# Patient Record
Sex: Male | Born: 1960
Health system: Southern US, Community
[De-identification: ages and names within clinical notes are randomized; demographics above are authoritative.]

## PROBLEM LIST (undated history)

## (undated) DIAGNOSIS — I255 Ischemic cardiomyopathy: Secondary | ICD-10-CM

## (undated) DIAGNOSIS — H53139 Sudden visual loss, unspecified eye: Secondary | ICD-10-CM

## (undated) DIAGNOSIS — F1011 Alcohol abuse, in remission: Secondary | ICD-10-CM

## (undated) DIAGNOSIS — R2681 Unsteadiness on feet: Secondary | ICD-10-CM

## (undated) DIAGNOSIS — I2510 Atherosclerotic heart disease of native coronary artery without angina pectoris: Secondary | ICD-10-CM

## (undated) DIAGNOSIS — D649 Anemia, unspecified: Secondary | ICD-10-CM

## (undated) DIAGNOSIS — Z8711 Personal history of peptic ulcer disease: Secondary | ICD-10-CM

## (undated) DIAGNOSIS — I10 Essential (primary) hypertension: Secondary | ICD-10-CM

## (undated) DIAGNOSIS — N189 Chronic kidney disease, unspecified: Secondary | ICD-10-CM

## (undated) DIAGNOSIS — G4730 Sleep apnea, unspecified: Secondary | ICD-10-CM

## (undated) DIAGNOSIS — E785 Hyperlipidemia, unspecified: Secondary | ICD-10-CM

## (undated) DIAGNOSIS — N529 Male erectile dysfunction, unspecified: Secondary | ICD-10-CM

## (undated) DIAGNOSIS — Z8719 Personal history of other diseases of the digestive system: Secondary | ICD-10-CM

## (undated) DIAGNOSIS — Z8673 Personal history of transient ischemic attack (TIA), and cerebral infarction without residual deficits: Secondary | ICD-10-CM

## (undated) DIAGNOSIS — H3560 Retinal hemorrhage, unspecified eye: Secondary | ICD-10-CM

## (undated) DIAGNOSIS — C61 Malignant neoplasm of prostate: Secondary | ICD-10-CM

## (undated) HISTORY — DX: Anemia, unspecified: D64.9

## (undated) HISTORY — DX: Personal history of transient ischemic attack (TIA), and cerebral infarction without residual deficits: Z86.73

## (undated) HISTORY — DX: Retinal hemorrhage, unspecified eye: H35.60

## (undated) HISTORY — DX: Malignant neoplasm of prostate: C61

## (undated) HISTORY — DX: Ischemic cardiomyopathy: I25.5

## (undated) HISTORY — DX: Hyperlipidemia, unspecified: E78.5

## (undated) HISTORY — DX: Essential (primary) hypertension: I10

## (undated) HISTORY — DX: Atherosclerotic heart disease of native coronary artery without angina pectoris: I25.10

## (undated) HISTORY — DX: Sudden visual loss, unspecified eye: H53.139

## (undated) HISTORY — DX: Sleep apnea, unspecified: G47.30

## (undated) HISTORY — DX: Unsteadiness on feet: R26.81

## (undated) HISTORY — DX: Alcohol abuse, in remission: F10.11

---

## 2010-05-21 HISTORY — PX: REFRACTIVE SURGERY: SHX103

## 2010-05-21 HISTORY — PX: CORONARY ANGIOPLASTY WITH STENT PLACEMENT: SHX49

## 2010-11-28 ENCOUNTER — Inpatient Hospital Stay (HOSPITAL_COMMUNITY): Payer: Self-pay

## 2010-11-28 ENCOUNTER — Encounter: Payer: Self-pay | Admitting: Internal Medicine

## 2010-11-28 ENCOUNTER — Inpatient Hospital Stay (HOSPITAL_COMMUNITY)
Admission: EM | Admit: 2010-11-28 | Discharge: 2010-12-01 | DRG: 125 | Disposition: A | Discharge status: Home / Self Care | Payer: Self-pay | Source: Ambulatory Visit | Attending: Internal Medicine | Admitting: Internal Medicine

## 2010-11-28 ENCOUNTER — Other Ambulatory Visit (HOSPITAL_COMMUNITY): Payer: Self-pay

## 2010-11-28 ENCOUNTER — Emergency Department (HOSPITAL_COMMUNITY): Payer: Self-pay

## 2010-11-28 DIAGNOSIS — E785 Hyperlipidemia, unspecified: Secondary | ICD-10-CM | POA: Diagnosis present

## 2010-11-28 DIAGNOSIS — D649 Anemia, unspecified: Secondary | ICD-10-CM | POA: Diagnosis present

## 2010-11-28 DIAGNOSIS — I639 Cerebral infarction, unspecified: Secondary | ICD-10-CM | POA: Insufficient documentation

## 2010-11-28 DIAGNOSIS — IMO0002 Reserved for concepts with insufficient information to code with codable children: Secondary | ICD-10-CM | POA: Diagnosis present

## 2010-11-28 DIAGNOSIS — E1165 Type 2 diabetes mellitus with hyperglycemia: Secondary | ICD-10-CM | POA: Diagnosis present

## 2010-11-28 DIAGNOSIS — R0609 Other forms of dyspnea: Secondary | ICD-10-CM | POA: Diagnosis present

## 2010-11-28 DIAGNOSIS — R0989 Other specified symptoms and signs involving the circulatory and respiratory systems: Secondary | ICD-10-CM | POA: Diagnosis present

## 2010-11-28 DIAGNOSIS — H35039 Hypertensive retinopathy, unspecified eye: Principal | ICD-10-CM | POA: Diagnosis present

## 2010-11-28 DIAGNOSIS — I10 Essential (primary) hypertension: Secondary | ICD-10-CM | POA: Diagnosis present

## 2010-11-28 DIAGNOSIS — H538 Other visual disturbances: Secondary | ICD-10-CM | POA: Diagnosis present

## 2010-11-28 LAB — CBC
HCT: 32 % — ABNORMAL LOW (ref 39.0–52.0)
Hemoglobin: 11.1 g/dL — ABNORMAL LOW (ref 13.0–17.0)
MCH: 29.1 pg (ref 26.0–34.0)
MCHC: 34.7 g/dL (ref 30.0–36.0)
MCV: 84 fL (ref 78.0–100.0)
RDW: 13.7 % (ref 11.5–15.5)

## 2010-11-28 LAB — CK TOTAL AND CKMB (NOT AT ARMC): Relative Index: 2.7 — ABNORMAL HIGH (ref 0.0–2.5)

## 2010-11-28 LAB — DIFFERENTIAL
Eosinophils Relative: 4 % (ref 0–5)
Lymphocytes Relative: 21 % (ref 12–46)
Lymphs Abs: 2.1 10*3/uL (ref 0.7–4.0)
Monocytes Absolute: 0.9 10*3/uL (ref 0.1–1.0)
Monocytes Relative: 9 % (ref 3–12)
Neutro Abs: 6.8 10*3/uL (ref 1.7–7.7)

## 2010-11-28 LAB — IRON AND TIBC
Iron: 42 ug/dL (ref 42–135)
TIBC: 327 ug/dL (ref 215–435)

## 2010-11-28 LAB — CARDIAC PANEL(CRET KIN+CKTOT+MB+TROPI)
CK, MB: 3 ng/mL (ref 0.3–4.0)
CK, MB: 3.1 ng/mL (ref 0.3–4.0)
Relative Index: 2.3 (ref 0.0–2.5)
Total CK: 133 U/L (ref 7–232)
Troponin I: <0.3 ng/mL (ref <0.30)

## 2010-11-28 LAB — BASIC METABOLIC PANEL
BUN: 9 mg/dL (ref 6–23)
Creatinine, Ser: 0.95 mg/dL (ref 0.50–1.35)
GFR calc Af Amer: >60 mL/min (ref >60)
GFR calc non Af Amer: >60 mL/min (ref >60)
Glucose, Bld: 231 mg/dL — ABNORMAL HIGH (ref 70–99)
Potassium: 3.5 mEq/L (ref 3.5–5.1)

## 2010-11-28 LAB — FERRITIN: Ferritin: 230 ng/mL (ref 22–322)

## 2010-11-28 LAB — GLUCOSE, CAPILLARY: Glucose-Capillary: 259 mg/dL — ABNORMAL HIGH (ref 70–99)

## 2010-11-28 NOTE — H&P (Signed)
Hospital Admission Note Date: 11/28/2010  Patient name: Kevin Soto Medical record number: [id number] Date of birth: [birth year] Age: 50 y.o. Gender: male PCP: No primary provider on file.  Medical Service: B1 Teaching Service  Attending physician: Dr. Eppie Gibson   Pager: Resident (R2/R3):Dr. Ina Homes    Pager: [phone] Resident (R1): Dr. Victorio Palm    Pager: [phone]   CC: blurry vision in left eye   HPI:  Kevin Soto is a 50 year old male without a significant PMH who presents to the ED for blurry vision in the left eye. He does not have a documented history of tobacco, diabetes or hypertension and has not seen a physician in over 10 years. He reports a family history significant CHF, hypertension, CAD, diabetes, and stroke.  He states his symptoms began late Sunday evening yesterday when he was at home when both of eyes began to water. He reports using Visine drops because he initially thought his eyes may be dry. However, this did provide him with any relief. Over the next few minutes, he states his vision became "blurry and foggy" and there "was a white cloud" in his left eye. He reports that he cannot see images but is able to see some color in his left eye. His visual acuity and color vision in his right eye is intact. He denies a history of vision problems, glasses or contact use, tenderness, soreness, headache, facial pain, trauma, or itching. He also denies any dizziness, weakness, numbness, or chest pain. He states this has never happened before. In addition to the loss of vision in his left eye, he also reports increased exertional dyspnea since Sunday. He denies orthopnea, palpitations, PND, wheezing, coughing, edema, or sputum. He has not been treated for this problem, but does note resting alleviates his respiratory symptoms. Prior to this, he reports being able to climb multiple flights of stairs without becoming short of breath.  He reports he does not have a primary care doctor. He  admits to alcohol use, but denies ever having withdrawal symptoms or dependence. He also denies taking any medications or illicit drug use.  He is seeking further evaluation by the ED for his acute vision loss in his left eye.   Allergies: NKDA  PMH: None. Patient does not have a PCP and has not sought medical attention in over 10 years.  Past Surgical History: None  Substance History: never used tobacco; drinks 1 6 pack of beer and 16 ounces of beer during the week  Social History: lives in [place]; worked Electrical engineer stages until 3 months ago; now does free lance work; finished high school and 2 years of college; single, no children     Family History:  Mother- deceased around 32 yo due to CHF Father- deceased at 69yo due to CAD 1 sister- 2 strokes in early 68s  1 sister- 5 MIs in early 70s  Review of Systems: as per HPI General: no fever, chills diaphoresis HEENT: no earache, vertigo, hearing loss, rhinorrhea, allergies. Cardiac: No chest pain, palpitations, orthopnea, PND, syncope.  Resp: Dyspnea on exertion. No cough, sputum, wheezing.  GI: No nausea, vomiting, dysphagia. GU: No dysuria, urinary frequency. MSK: no recent trauma.  Neuro/Eyes: Blurry vision in the left eye. Vision intact in the right eye. No weakness, numbness, tremor, headache, diplopia, hearing loss, dizziness, vertigo, loss of sensation, paralysis.  Physical Exam:  General: 50 year old male who is sitting up in bed and in no acute distress  Vitals:  BP: 235-162/97-133 T:  98.1 HR 107 Resp: 20 Skin: Bilateral scarring on LE.  Head: Normocephalic.  Eyes: PERRLA. EOM intact. 20/25 visual acuity in the right eye and color vision intact. Less than 20/200 visual acuity in the left eye. Patient able to see some colors in the left eye.   Heart: RRR. No murmurs, gallops or rubs. Normal S1 and S2. Lungs: Lungs clear to auscultation bilaterally. No wheezes, crackles, or rales.    Extremities: <+1 bilateral  pitting edema on LE. Pulses 2+ and symmetric. Neurologic: CN2-12.  Intact.UE and LE sensation intact and strength 5/5 bilaterally.  Labs:  WBC COUNT 10.3 K/uL [4.0-10.5] RBC COUNT 3.81 MIL/uL [4.22-5.81] L HEMOGLOBIN 11.1 g/dL [13.0-17.0] L HEMATOCRIT 32.0 % [39.0-52.0] L MCV 84.0 fL [78.0-100.0] MCH 29.1 pg [26.0-34.0] MCHC 34.7 g/dL [30.0-36.0] RDW 13.7 % [11.5-15.5] PLATELET COUNT 350 K/uL [150-400] NEUTROPHIL 66 % [43-77] ABS GRANULOCYTE 6.8 K/uL [1.7-7.7] LYMPHOCYTE 21 % [12-46] ABS LYMPH 2.1 K/uL [0.7-4.0] MONOCYTE 9 % [3-12] ABS MONOCYTE 0.9 K/uL [0.1-1.0] EOSINOPHIL 4 FULL RESULT TOO LARGE TO VIEW HERE  SODIUM 140 mEq/L [135-145] POTASSIUM 3.5 mEq/L [3.5-5.1] CHLORIDE 103 mEq/L [96-112] CARBON DIOXIDE 28 mEq/L [19-32] GLUCOSE 231 mg/dL [70-99] H BUN 9 mg/dL [6-23] CREATININE 0.95 mg/dL [0.50-1.35] CALCIUM 9.1 mg/dL [8.4-10.5] GFR, Est Non Af Am >60 mL/min [>60] GFR, Est Afr Am >60 mL/min [>60]  Result: TROPONIN I <0.30 ng/mL [<0.30] CK, TOTAL 120 U/L [7-232] CK-MB 3.2 ng/mL [0.3-4.0] RELATIVE INDEX 2.7 [0.0-2.5] H B NATRIURETIC PEPTID 2396.0 pg/mL [0-125]   XRAY *RADIOLOGY REPORT* Clinical Data: Vertigo. Visual disturbance. CHEST - 2 VIEW Comparison: None. Findings: Heart size is at upper limit of normal. Diffuse pulmonary vascular congestion is seen. Poor definition of pulmonary interstitial markings is noted, and mild interstitial edema cannot be excluded. Tiny right pleural effusion or pleural thickening noted posteriorly. No mass or adenopathy identified. IMPRESSION: Pulmonary vascular congestion, and possible mild interstitial edema or pneumonitis. Comparison with any previous outside chest radiographs would be helpful if available.  CT HEAD W/O CONTRAST IMPRESSION: Age indeterminate infarcts within the right thalamus and right cerebellum - may be subacute given history. No evidence of Hemorrhage.   Assessment and Plan:  1. Left eye vision  loss- Opthomology was consulted. Head CT demonstrated possible subacute infarcts of the right thalamus and right cerebellum. MRI was also completed and results are pending. Based on the patient's presentation of hypertension and DM, the broad differential includes retinal detachment, vitreous hemorrhage, ischemic optic neuropathy, and foreign body. Less likely diagnosis include ophthalmic artery occlusion since his color vision is intact. 2. Hypertensive emergency- Patient's BP is elevated but he is asymptomatic. First set of cardiac enzymes were negative. Continue to monitor urinary output, creatinine, and any additional neurologic abnormalities. Given BP of 230/130 with new onset of blurry vision, patient meets criteria for HTN. Patient was started on nicardipine drip 5mg /hr IV.   3. Dyspnea, Pitting Edema- Patient reports a 2 day history of dyspnea on exertion which is alleviated with rest. On exam, lungs were clear bilaterally. CXR reveled heart size was the upper limit of normal, pulmonary vascular congestion, and possible mild interstitial edema or pneumonitis. BNP was also elevated at 2396.0 pg/mL. On physical exam, there was mild bilateral pitting edema. Based on the symptoms, labs, and family history, the most likely diagnosis is CHF which may have been precipitated from hypertensive crisis. Other diagnosis to consider include pulmonary edema and myocardial ischemia. EKG revealed diffuse abnormal T waves. Further workup is required and the patient is scheduled  for an echo; fasting lipid panel is pending. For the management of this patient, we will continue to monitor and treat his BP as well as  start Lasix  4. Anemia- On ED admission, hemoglobin was 11.1g/dL, and Hematocrit was 32.0%. His MCV was within normal limits. We will order an anemia panel to explore possible etiology/  5. Hyperglycemia- Fasting BS was 231mg /dL on admission.  Patient most likely has DM. This may be contributing to his  retinopathy and will require long term management by an outpatient PCP following discharge. Check Hemoglobin A1C and started on sliding scale of insulin. Once the patient is stable, start on an ace inhibitor for hypertension and diabetes.    6. Once he is stable,  a. Tylenol 650mg  po every 6 hours prn for pain/fever  b. Zofran 4mg  IV every 4 hours prn nausea    R3: Dr. Ina Homes  [id number]      ____________________________________________________  R1: Dr. Victorio Palm [id number]   ____________________________________________________

## 2010-11-29 DIAGNOSIS — I059 Rheumatic mitral valve disease, unspecified: Secondary | ICD-10-CM

## 2010-11-29 LAB — HEMOGLOBIN A1C: Mean Plasma Glucose: 249 mg/dL — ABNORMAL HIGH (ref <117)

## 2010-11-29 LAB — GLUCOSE, CAPILLARY
Glucose-Capillary: 261 mg/dL — ABNORMAL HIGH (ref 70–99)
Glucose-Capillary: 176 mg/dL — ABNORMAL HIGH (ref 70–99)
Glucose-Capillary: 167 mg/dL — ABNORMAL HIGH (ref 70–99)

## 2010-11-29 LAB — DRUGS OF ABUSE SCREEN W/O ALC, ROUTINE URINE
Marijuana Metabolite: NEGATIVE
Opiate Screen, Urine: NEGATIVE
Propoxyphene: NEGATIVE

## 2010-11-29 LAB — LIPID PANEL
Cholesterol: 227 mg/dL — ABNORMAL HIGH (ref 0–200)
Triglycerides: 104 mg/dL (ref <150)

## 2010-11-30 DIAGNOSIS — I10 Essential (primary) hypertension: Secondary | ICD-10-CM

## 2010-11-30 DIAGNOSIS — R933 Abnormal findings on diagnostic imaging of other parts of digestive tract: Secondary | ICD-10-CM

## 2010-11-30 LAB — GLUCOSE, CAPILLARY
Glucose-Capillary: 183 mg/dL — ABNORMAL HIGH (ref 70–99)
Glucose-Capillary: 202 mg/dL — ABNORMAL HIGH (ref 70–99)

## 2010-12-01 ENCOUNTER — Inpatient Hospital Stay (HOSPITAL_COMMUNITY): Payer: Self-pay

## 2010-12-01 DIAGNOSIS — I5022 Chronic systolic (congestive) heart failure: Secondary | ICD-10-CM

## 2010-12-01 LAB — GLUCOSE, CAPILLARY

## 2010-12-01 MED ORDER — TECHNETIUM TC 99M TETROFOSMIN IV KIT
10.0000 | PACK | Freq: Once | INTRAVENOUS | Status: AC | PRN
  Administered 2010-12-01: 10 via INTRAVENOUS

## 2010-12-01 MED ORDER — TECHNETIUM TC 99M TETROFOSMIN IV KIT
30.0000 | PACK | Freq: Once | INTRAVENOUS | Status: AC | PRN
  Administered 2010-12-01: 30 via INTRAVENOUS

## 2010-12-02 ENCOUNTER — Other Ambulatory Visit (HOSPITAL_COMMUNITY): Payer: Self-pay

## 2010-12-04 ENCOUNTER — Telehealth: Payer: Self-pay | Admitting: Dietician

## 2010-12-04 NOTE — Telephone Encounter (Signed)
Left message for patient to return call.

## 2010-12-06 ENCOUNTER — Encounter: Payer: Self-pay | Admitting: Cardiology

## 2010-12-06 NOTE — Telephone Encounter (Signed)
Appointment scheduled for Thursday, 7.20.12

## 2010-12-07 ENCOUNTER — Encounter: Payer: Self-pay | Admitting: Cardiology

## 2010-12-07 ENCOUNTER — Encounter: Payer: Self-pay | Admitting: Dietician

## 2010-12-07 ENCOUNTER — Ambulatory Visit: Payer: Self-pay | Admitting: Dietician

## 2010-12-07 ENCOUNTER — Ambulatory Visit (INDEPENDENT_AMBULATORY_CARE_PROVIDER_SITE_OTHER): Payer: Self-pay | Admitting: Cardiology

## 2010-12-07 VITALS — BP 198/110 | HR 76 | Ht 72.0 in | Wt 221.2 lb

## 2010-12-07 DIAGNOSIS — I428 Other cardiomyopathies: Secondary | ICD-10-CM

## 2010-12-07 DIAGNOSIS — I10 Essential (primary) hypertension: Secondary | ICD-10-CM

## 2010-12-07 DIAGNOSIS — Z7289 Other problems related to lifestyle: Secondary | ICD-10-CM

## 2010-12-07 DIAGNOSIS — I420 Dilated cardiomyopathy: Secondary | ICD-10-CM

## 2010-12-07 MED ORDER — SPIRONOLACTONE 25 MG PO TABS: 25.0000 mg | ORAL_TABLET | Freq: Every day | ORAL | Status: DC

## 2010-12-07 MED ORDER — CLONIDINE HCL 0.1 MG PO TABS: 0.1000 mg | ORAL_TABLET | Freq: Two times a day (BID) | ORAL | Status: DC | PRN

## 2010-12-07 MED ORDER — CARVEDILOL 12.5 MG PO TABS: 12.5000 mg | ORAL_TABLET | Freq: Two times a day (BID) | ORAL | Status: DC

## 2010-12-07 MED ORDER — LISINOPRIL 20 MG PO TABS: 20.0000 mg | ORAL_TABLET | Freq: Every day | ORAL | Status: DC

## 2010-12-07 NOTE — Patient Instructions (Addendum)
Please increase your Carvedilol to 12.5 mg one twice a day, increase your Lisinopril to 20 mg a day and increase your Spironolactone to 25 mg a day.  Please take Clonidine 0.1 mg up to twice a day if your systolic blood pressure is 180 or higher.  Please follow up in 1 week as scheduled

## 2010-12-07 NOTE — Assessment & Plan Note (Signed)
I suspect that this is hypertensive although I will consider cath in the future given the multiple perfusion defects.  We need to get BP control first.

## 2010-12-07 NOTE — Assessment & Plan Note (Signed)
His blood pressure is still poorly controlled. Tonight they will go prescription filled for .1 clonidine and take this. He may repeat this if his systolic remains above 99991111. I'm going to increase the medications as listed below doubling his beta blocker, ACE inhibitor and Aldactone. He will come back or followup next week. He does have a blood pressure cuff at home and he will call is in the morning with his reading. I will also try to facilitate followup with his primary providers.  (Greater than 40 minutes reviewing all data with greater than 50% face to face with the patient).  He has been educated extensively at this visit about BP management.

## 2010-12-07 NOTE — Progress Notes (Signed)
HPI The patient presents for evaluation of hypertension and cardiomyopathy. He was recently hospitalized for this. He was ejection fraction 45%. There was diffuse hypokinesis as well as some septal apical and inferobasilar wall motion abnormalities. He presented with blurred vision and a retinal hemorrhage and was found to have hypertensive urgency or brain MRI demonstrate no acute events. He had some mild interstitial edema on chest x-ray. Stress perfusion study was nondiagnostic for ischemia. There was scar felt to be in the anterior anterolateral anteroseptal and inferoseptal as well as inferolateral and apical distribution.  He was medically managed.  In retrospect he was having some dyspnea this is resolved. He stated his medications. He still drinking 1/5 of alcohol weekly. He is trying to avoid salt. He does do some walking.  The patient denies any new symptoms such as chest discomfort, neck or arm discomfort. There has been no new shortness of breath, PND or orthopnea. There have been no reported palpitations, presyncope or syncope.   No Known Allergies  Current Outpatient Prescriptions  Medication Sig Dispense Refill  . carvedilol (COREG) 6.25 MG tablet Take 6.25 mg by mouth 2 (two) times daily.        . insulin aspart (NOVOLOG) 100 UNIT/ML injection        . lisinopril (PRINIVIL,ZESTRIL) 10 MG tablet Take 10 mg by mouth daily.        . metFORMIN (GLUCOPHAGE) 500 MG tablet Take 500 mg by mouth 2 (two) times daily with a meal.       . pravastatin (PRAVACHOL) 40 MG tablet Take 40 mg by mouth daily.        Marland Kitchen spironolactone (ALDACTONE) 25 MG tablet Take 12.5 mg by mouth daily.          Past Medical History  Diagnosis Date  . Diabetes mellitus   . Hypertension   . Macular hemorrhage   . Dyslipidemia   . Dilated cardiomyopathy   . Anemia     Past Surgical History  Procedure Date  . None     Family History  Problem Relation Age of Onset  . Heart attack Mother     Died 61  .  Heart failure Mother   . Diabetes Mother   . Heart attack Father   . Heart failure Father   . Diabetes Father   . Heart disease Sister     s/p CABG x3 in her mid 10's  . Coronary artery disease Other     Premature    History   Social History  . Marital Status: Single    Spouse Name: N/A    Number of Children: 0  . Years of Education: N/A   Occupational History  . Stage hand    Social History Main Topics  . Smoking status: Never Smoker   . Smokeless tobacco: Not on file  . Alcohol Use: 15.6 oz/week    6 Cans of beer, 20 Shots of liquor per week     6-pack of beer a week/ half a fifth of liquor each week  . Drug Use: No  . Sexually Active: Not on file   Other Topics Concern  . Not on file   Social History Narrative   [place] in [place] with his cousinNo childrenExercises 3 times a week    ROS:  As stated in the HPI and negative for all other systems.  PHYSICAL EXAM BP 198/110  Pulse 76  Ht 6' (1.829 m)  Wt 221 lb 3.2 oz (100.336 kg)  BMI 30.00 kg/m2 GENERAL:  Well appearing HEENT:  Pupils equal round and reactive, fundi not visualized, oral mucosa unremarkable NECK:  No jugular venous distention, waveform within normal limits, carotid upstroke brisk and symmetric, no bruits, no thyromegaly LYMPHATICS:  No cervical, inguinal adenopathy LUNGS:  Clear to auscultation bilaterally BACK:  No CVA tenderness CHEST:  Unremarkable HEART:  PMI not displaced or sustained,S1 and S2 within normal limits, no S3, no clicks, no rubs, no , positive S4 ABD:  Flat, positive bowel sounds normal in frequency in pitch, no bruits, no rebound, no guarding, no midline pulsatile mass, no hepatomegaly, no splenomegaly EXT:  2 plus pulses throughout, no edema, no cyanosis no clubbing SKIN:  No rashes no nodules NEURO:  Cranial nerves II through XII grossly intact, motor grossly intact throughout PSYCH:  Cognitively intact, oriented to person place and time  EKG:  11/28/10  Sinus rhythm,  anterolateral T wave inversion.  ASSESSMENT AND PLAN

## 2010-12-07 NOTE — Assessment & Plan Note (Signed)
I suggested no EtOH use but at the least he needs to greatly cut back.

## 2010-12-08 ENCOUNTER — Telehealth: Payer: Self-pay | Admitting: Cardiology

## 2010-12-08 NOTE — Telephone Encounter (Signed)
Saw doctor yesterday and was told to call BP reading in today.  This am 184/107  And after eating 143/74. Pt is feeling fine.  Please call wife back at [phone]

## 2010-12-08 NOTE — Telephone Encounter (Signed)
BP was 184/107 this morning before medication. After medication, his BP was 143/74. Patient is feeling ok. They will notify us if his BP increases.

## 2010-12-13 ENCOUNTER — Ambulatory Visit (INDEPENDENT_AMBULATORY_CARE_PROVIDER_SITE_OTHER): Payer: Self-pay | Admitting: Nurse Practitioner

## 2010-12-13 ENCOUNTER — Encounter: Payer: Self-pay | Admitting: Nurse Practitioner

## 2010-12-13 VITALS — BP 150/82 | HR 74 | Ht 72.0 in | Wt 228.0 lb

## 2010-12-13 DIAGNOSIS — I420 Dilated cardiomyopathy: Secondary | ICD-10-CM

## 2010-12-13 DIAGNOSIS — I10 Essential (primary) hypertension: Secondary | ICD-10-CM

## 2010-12-13 DIAGNOSIS — I428 Other cardiomyopathies: Secondary | ICD-10-CM

## 2010-12-13 LAB — BASIC METABOLIC PANEL
BUN: 17 mg/dL (ref 6–23)
CO2: 29 mEq/L (ref 19–32)
Calcium: 9.4 mg/dL (ref 8.4–10.5)
Chloride: 105 mEq/L (ref 96–112)
Creatinine, Ser: 1.2 mg/dL (ref 0.4–1.5)
GFR: 82.41 mL/min (ref >60.00)
Glucose, Bld: 210 mg/dL — ABNORMAL HIGH (ref 70–99)
Potassium: 4.3 mEq/L (ref 3.5–5.1)
Sodium: 140 mEq/L (ref 135–145)

## 2010-12-13 MED ORDER — LISINOPRIL 20 MG PO TABS: 20.0000 mg | ORAL_TABLET | Freq: Two times a day (BID) | ORAL | Status: DC

## 2010-12-13 MED ORDER — CARVEDILOL 12.5 MG PO TABS: 25.0000 mg | ORAL_TABLET | Freq: Two times a day (BID) | ORAL | Status: DC

## 2010-12-13 NOTE — Progress Notes (Signed)
Kevin Soto Date of Birth: 07/03/1960   History of Present Illness: Kevin Soto is seen back today for a one week check. He is seen for [name]. He is here with his wife. He is feeling ok. He is not tolerating the Clonidine. He is sleeping too much, feels staggery and his mouth is too dry. His wife thought the clonidine made his blood pressure go up.  Blood pressure readings from home range in the 150 to 190's. He is drinking less alcohol. No chest pain. No shortness of breath. He is tolerating the other medicines. He has never had his cuff checked for accuracy. He will more than likely require cardiac cath once his blood pressure is controlled due to scarring on nuclear study. There was no ischemia. EF was 45%.   Current Outpatient Prescriptions on File Prior to Visit  Medication Sig Dispense Refill  . cloNIDine (CATAPRES) 0.1 MG tablet Take 1 tablet (0.1 mg total) by mouth 2 (two) times daily as needed (may take twice a day if systolic blood pressure 99991111 or higher).  60 tablet  6  . metFORMIN (GLUCOPHAGE) 500 MG tablet Take 500 mg by mouth 2 (two) times daily with a meal.       . pravastatin (PRAVACHOL) 40 MG tablet Take 40 mg by mouth daily.        Marland Kitchen spironolactone (ALDACTONE) 25 MG tablet Take 1 tablet (25 mg total) by mouth daily.  30 tablet  6  . DISCONTD: carvedilol (COREG) 12.5 MG tablet Take 1 tablet (12.5 mg total) by mouth 2 (two) times daily.  60 tablet  6  . DISCONTD: lisinopril (PRINIVIL,ZESTRIL) 20 MG tablet Take 1 tablet (20 mg total) by mouth daily.  30 tablet  6    No Known Allergies  Past Medical History  Diagnosis Date  . Diabetes mellitus   . Hypertension   . Macular hemorrhage   . Dyslipidemia   . Dilated cardiomyopathy     EF is 45%  . Anemia   . Abnormal nuclear cardiac imaging test     No ischemia, but with scar. EF 45%    No past surgical history on file.  History  Smoking status  . Never Smoker   Smokeless tobacco  . Never Used     History  Alcohol Use  . 15.6 oz/week  . 6 Cans of beer, 20 Shots of liquor per week    6-pack of beer a week/ half a fifth of liquor each week    Family History  Problem Relation Age of Onset  . Heart attack Mother     Died 61  . Heart failure Mother   . Diabetes Mother   . Heart attack Father   . Heart failure Father   . Diabetes Father   . Heart disease Sister     s/p CABG x3 in her mid 80's  . Coronary artery disease Other     Premature    Review of Systems: The review of systems is as above. No edema. No shortness of breath. No chest pain.  All other systems were reviewed and are negative.  Physical Exam: BP 150/82  Pulse 74  Ht 6' (1.829 m)  Wt 228 lb (103.42 kg)  BMI 30.92 kg/m2 Patient is a pleasant black male who is in no acute distress. Skin is warm and dry. Color is normal.  HEENT is unremarkable. Normocephalic/atraumatic. PERRL. Sclera are nonicteric. Neck is supple. No masses. No JVD. Lungs are clear.  Cardiac exam shows a regular rate and rhythm. Positive S4 noted. Abdomen is soft. Extremities are without edema. Gait and ROM are intact. No gross neurologic deficits noted.  LABORATORY DATA: BMET is pending   Assessment / Plan:

## 2010-12-13 NOTE — Assessment & Plan Note (Signed)
Will ultimately need cath once blood pressure is more controlled. Patient is aware.

## 2010-12-13 NOTE — Assessment & Plan Note (Addendum)
Blood pressure still not controlled. He does not tolerate Clonidine. I have increased the Coreg to 25 mg BID and increased the lisinopril to 20 mg BID. We will check a BMET to look at potassium level. I will see him in one week and he will bring his cuff in to check for correlation. He will continue to monitor at home. Patient and wife are agreeable to this plan and will call if any problems develop in the interim.   Addendum:  Spoke with Dr. Marinda Elk of [facility]. Blood pressure still elevated this past Friday. HCTZ was started. Appointment with opthamologist was made as well.

## 2010-12-13 NOTE — Patient Instructions (Signed)
Increase your Coreg to 25 mg two times a day (take 2 of your 12.5 mg tablets two times a day) Increase your Lisinopril to 20 mg two times a day I will see you in one week You may stay off the Clonidine Call for blood pressure above 180 consistently Continue to watch your salt Bring your blood pressure cuff in for me to check.

## 2010-12-14 ENCOUNTER — Telehealth: Payer: Self-pay | Admitting: Cardiology

## 2010-12-14 NOTE — Telephone Encounter (Signed)
Returning call back to christine

## 2010-12-14 NOTE — Telephone Encounter (Signed)
PT AWARE OF LAB RESULTS SEE LAB REPORT .Kevin Soto

## 2010-12-15 ENCOUNTER — Encounter: Payer: Self-pay | Admitting: Internal Medicine

## 2010-12-15 ENCOUNTER — Ambulatory Visit (INDEPENDENT_AMBULATORY_CARE_PROVIDER_SITE_OTHER): Payer: Self-pay | Admitting: Internal Medicine

## 2010-12-15 VITALS — BP 181/103 | HR 80 | Temp 97.0°F | Ht 72.0 in | Wt 226.0 lb

## 2010-12-15 DIAGNOSIS — E119 Type 2 diabetes mellitus without complications: Secondary | ICD-10-CM

## 2010-12-15 DIAGNOSIS — I420 Dilated cardiomyopathy: Secondary | ICD-10-CM

## 2010-12-15 DIAGNOSIS — I428 Other cardiomyopathies: Secondary | ICD-10-CM

## 2010-12-15 DIAGNOSIS — H3560 Retinal hemorrhage, unspecified eye: Secondary | ICD-10-CM

## 2010-12-15 DIAGNOSIS — I10 Essential (primary) hypertension: Secondary | ICD-10-CM

## 2010-12-15 DIAGNOSIS — E1149 Type 2 diabetes mellitus with other diabetic neurological complication: Secondary | ICD-10-CM | POA: Insufficient documentation

## 2010-12-15 MED ORDER — HYDROCHLOROTHIAZIDE 12.5 MG PO CAPS: 25.0000 mg | ORAL_CAPSULE | Freq: Every day | ORAL | Status: DC

## 2010-12-15 NOTE — Assessment & Plan Note (Addendum)
Patient has been on three medications since being discharged from the hospital. However, his Bp was not well controlled. His Bp is 181/103 today. 25 mg once daily HCTZ is added to his regimen. Patient is instructed on next Monday when I will check his Bp and BMP. I will contact [name] to have his opinion about patient's future plan.

## 2010-12-15 NOTE — Assessment & Plan Note (Signed)
Patient is free of symptoms now. There are no signs of fluid retention. Will continue current regimen and follow up. BMP will be done on next Monday.

## 2010-12-15 NOTE — Assessment & Plan Note (Signed)
Patient is on metformin. Will observe closely to see his response. No change for current regimen.

## 2010-12-15 NOTE — Progress Notes (Signed)
  Subjective:    Patient ID: Kevin Soto, male    DOB: 08-28-1960, 50 y.o.   MRN: [id number]  HPI Patient is 50 yo male presents for a follow-up visit. Patient did not have significant PMH until his last admission on July 10th, 2012 because of hypertensive emergency. He was hospitalized because of burry vision and dyspnea on exertion. She was found to have high blood pressure (235-162/97133 mmHg), high blood glucose, and hypertensive retinopathy with retinal macular hemorrhage of the left eye and cardiomyopathy with diffuse hypokinesis as well as some septal apical and inferobasilar wall motion abnormalities. He was treated with Spironolactone, Lisinopril, Carvedilol, Simvastatin and metformin.  After being discharged, he was followed up by [name]. Today, patient reports of blurry vision in his left eye. He denies headache, chest pain, SOB, abdominal pain, weakness, numbness, swelling in ankles, fever and rashes.   Review of Systems  General: no fevers, chills, changes in weight, changes in appetite Skin: no rash HEENT:  blurry vision in left eye, no  hearing changes, sore throat Pulm: no dyspnea, coughing, wheezing CV: no chest pain, palpitations, shortness of breath Abd: no abdominal pain, nausea/vomiting, diarrhea/constipation GU: no dysuria, hematuria, polyuria Ext: no arthralgias, myalgias and edema Neuro: no weakness, numbness, or tingling      Objective:   Physical Exam  General: alert, well-developed, and cooperative to examination.  Head: normocephalic and atraumatic.  Eyes: Decreased acuity in left eye, EOMI, pupils equal, pupils round, pupils reactive to light, no injection and anicteric.  Mouth: pharynx pink and moist, no erythema, and no exudates.  Neck: supple, full ROM, no thyromegaly, no JVD, and no carotid bruits.  Lungs: normal respiratory effort, no accessory muscle use, normal breath sounds, no crackles, and no wheezes. Heart: normal rate, regular rhythm, no  murmur, no gallop, and no rub.  Abdomen: soft, non-tender, normal bowel sounds, no distention, no guarding, no rebound tenderness, no hepatomegaly, and no splenomegaly.  Msk: no joint swelling, no joint warmth, and no redness over joints.  Pulses: 2+ DP/PT pulses bilaterally Extremities: No cyanosis, clubbing, edema Neurologic: cranial nerves II-XII intact, strength normal in all extremities, sensation intact to light touch, and gait normal.  Skin: turgor normal and no rashes.           Assessment & Plan:

## 2010-12-15 NOTE — Assessment & Plan Note (Signed)
We made an appointment with ophthalmologist for him on Aug. 3, 2012. Will follow up.

## 2010-12-15 NOTE — Patient Instructions (Addendum)
1. Please start new medication, Hydrochlorothiazide 12.5 mg, two capsules daily. 2. Please come back on Next Monday afternoon July 30 at 2:30 PM. 3. Please keep your appointment with your eye doctor,  Dr. Baird Cancer at 12:45 on Aug. 3rd, 2012.

## 2010-12-18 ENCOUNTER — Ambulatory Visit (INDEPENDENT_AMBULATORY_CARE_PROVIDER_SITE_OTHER): Payer: Self-pay | Admitting: Internal Medicine

## 2010-12-18 ENCOUNTER — Encounter: Payer: Self-pay | Admitting: Internal Medicine

## 2010-12-18 VITALS — BP 167/95 | HR 73 | Temp 98.7°F | Ht 72.0 in | Wt 223.8 lb

## 2010-12-18 DIAGNOSIS — I428 Other cardiomyopathies: Secondary | ICD-10-CM

## 2010-12-18 DIAGNOSIS — E119 Type 2 diabetes mellitus without complications: Secondary | ICD-10-CM

## 2010-12-18 DIAGNOSIS — I420 Dilated cardiomyopathy: Secondary | ICD-10-CM

## 2010-12-18 DIAGNOSIS — I10 Essential (primary) hypertension: Secondary | ICD-10-CM

## 2010-12-18 NOTE — Progress Notes (Signed)
  Subjective:    Patient ID: Kevin Soto, male    DOB: 1960/08/05, 50 y.o.   MRN: [id number]  HPI Patient is 50 yo man with recently diagnosed HTN, DM-II and cardiomyopathy presents for a follow-up visit. Patient was seen on last Friday. He has been on three medications including Coreg, Lisinopril and Spironolactone for his HTN, but his blood pressure was not well controlled. On last Friday (December 15, 2010), 25 mg HCTZ once daily was added to his regimen. Today he does not have any new complains and reports that his blood pressure has been in the ranges of 138/101 to 188/105 mmHg since HCTZ was started. Patient reports blurry vision in his left eye. He denies headache, chest pain, SOB, abdominal pain, weakness, numbness, swelling in ankles, fever and rashes.   Review of Systems General: no fevers, chills, changes in weight, changes in appetite Skin: no rash HEENT: blurry vision in left eye, no hearing changes, sore throat Pulm: no dyspnea, coughing, wheezing CV: no chest pain, palpitations, shortness of breath Abd: no abdominal pain, nausea/vomiting, diarrhea/constipation GU: no dysuria, hematuria, polyuria Ext: no arthralgias, myalgias and edema Neuro: no weakness, numbness, or tingling     Objective:   Physical Exam General: alert, well-developed, and cooperative to examination.  Head: normocephalic and atraumatic.  Eyes: Decreased acuity in left eye, EOMI, pupils equal, pupils round, pupils reactive to light, no injection and anicteric.  Mouth: pharynx pink and moist, no erythema, and no exudates.  Neck: supple, full ROM, no thyromegaly, no JVD, and no carotid bruits.  Lungs: normal respiratory effort, no accessory muscle use, normal breath sounds, no crackles, and no wheezes. Heart: normal rate, regular rhythm, no murmur, no gallop, and no rub.  Abdomen: soft, non-tender, normal bowel sounds, no distention, no guarding, no rebound tenderness, no hepatomegaly, and no splenomegaly.    Msk: no joint swelling, no joint warmth, and no redness over joints.  Pulses: 2+ DP/PT pulses bilaterally Extremities: No cyanosis, clubbing, edema Neurologic: cranial nerves II-XII intact, strength normal in all extremities, sensation intact to light touch, and gait normal.  Skin: turgor normal and no rashes.        Assessment & Plan:   No problem-specific assessment & plan notes found for this encounter.

## 2010-12-18 NOTE — Assessment & Plan Note (Signed)
Patient is free of symptoms now. There are no signs of fluid retention. Will continue current regimen and follow up. BMP was scheduled on Aug.1, 2012

## 2010-12-18 NOTE — Assessment & Plan Note (Signed)
After HCTZ was added to his regimen, his Bp has been in the ranges of 138/101 to 188/105 mmHg at home. Today,  two measures of his blood pressure were 167/95 and 150/80 mmHg at clinic. Will continue his current regimen and re-evaluate him in two weeks. BMP was scheduled on Aug. 1, 2012.

## 2010-12-18 NOTE — Progress Notes (Signed)
I saw patient and discussed his care with resident Dr. Blaine Hamper.  I agree with the clinical findings and plans as outlined in his note.

## 2010-12-18 NOTE — Progress Notes (Signed)
I saw patient and discussed his care with resident Dr. Blaine Hamper.  I agree with the clinical findings and plans as outlined in his note.  Patient's blood pressure is better following the addition of hydrochlorothiazide 25 mg daily to his regimen on 7/27.  He has a follow-up appointment scheduled in 2 days on August 1 with Truitt Merle at [facility], and I spoke with her this morning to let her know about our addition of HCTZ to his regimen.  She has already ordered a basic metabolic panel for August 1, and she will further adjust his regimen on 8/1 if needed.

## 2010-12-18 NOTE — Assessment & Plan Note (Signed)
Patient is on metformin. Will follow up his response. No change for current regimen.

## 2010-12-20 ENCOUNTER — Other Ambulatory Visit (INDEPENDENT_AMBULATORY_CARE_PROVIDER_SITE_OTHER): Payer: Self-pay | Admitting: *Deleted

## 2010-12-20 ENCOUNTER — Ambulatory Visit (INDEPENDENT_AMBULATORY_CARE_PROVIDER_SITE_OTHER): Payer: Self-pay | Admitting: Nurse Practitioner

## 2010-12-20 ENCOUNTER — Encounter: Payer: Self-pay | Admitting: Nurse Practitioner

## 2010-12-20 VITALS — BP 129/87 | HR 84 | Ht 72.0 in | Wt 220.0 lb

## 2010-12-20 DIAGNOSIS — H3560 Retinal hemorrhage, unspecified eye: Secondary | ICD-10-CM

## 2010-12-20 DIAGNOSIS — I10 Essential (primary) hypertension: Secondary | ICD-10-CM

## 2010-12-20 DIAGNOSIS — I420 Dilated cardiomyopathy: Secondary | ICD-10-CM

## 2010-12-20 DIAGNOSIS — I428 Other cardiomyopathies: Secondary | ICD-10-CM

## 2010-12-20 LAB — BASIC METABOLIC PANEL
BUN: 18 mg/dL (ref 6–23)
CO2: 28 mEq/L (ref 19–32)
Calcium: 9.3 mg/dL (ref 8.4–10.5)
Chloride: 100 mEq/L (ref 96–112)
Creatinine, Ser: 1.2 mg/dL (ref 0.4–1.5)
GFR: 82.4 mL/min (ref >60.00)
Glucose, Bld: 198 mg/dL — ABNORMAL HIGH (ref 70–99)
Potassium: 4.4 mEq/L (ref 3.5–5.1)
Sodium: 137 mEq/L (ref 135–145)

## 2010-12-20 NOTE — Assessment & Plan Note (Signed)
Blood pressure is down to 118/80 by me today. His machine reads 129/87. I have left him on his current regimen. BMET is checked today. I will see him in about 2 weeks. If he continues to do well we will set him up for cardiac catheterization. He is to watch his salt, minimize his alcohol and continue to monitor his blood pressure at home. Patient is agreeable to this plan and will call if any problems develop in the interim.

## 2010-12-20 NOTE — Assessment & Plan Note (Signed)
Nuclear study was abnormal and his EF was down. We will plan for cath in the near future. He currently looks compensated.

## 2010-12-20 NOTE — Patient Instructions (Addendum)
Stay on your current medicines I will see you in about 2 1/2 weeks Minimize your salt Monitor your blood pressure at home Goal is to try to stay below 140/90

## 2010-12-20 NOTE — Progress Notes (Signed)
Kevin Soto Date of Birth: 16-Dec-1960   History of Present Illness: Kevin Soto is seen back today for a one week check. He is seen for [name]. He has had recent hospitalization for hypertension. Nuclear study was abnormal. EF was 45%. Blood pressure is being treated. He will need cardiac cath in the future to further define his coronary situation. He is doing very well. HCTZ has been added to his regimen just a few days ago. He feels good on his medicines. No adverse effects noted. Blood pressure is coming down nicely. He did have some issues with his eye is going back to the eye doctor on Friday. No chest pain or shortness of breath is reported.   Current Outpatient Prescriptions on File Prior to Visit  Medication Sig Dispense Refill  . carvedilol (COREG) 12.5 MG tablet Take 2 tablets (25 mg total) by mouth 2 (two) times daily.  60 tablet  6  . hydrochlorothiazide (MICROZIDE) 12.5 MG capsule Take 2 capsules (25 mg total) by mouth daily.  30 capsule  3  . lisinopril (PRINIVIL,ZESTRIL) 20 MG tablet Take 1 tablet (20 mg total) by mouth 2 (two) times daily.  60 tablet  6  . metFORMIN (GLUCOPHAGE) 500 MG tablet Take 500 mg by mouth 2 (two) times daily with a meal.       . pravastatin (PRAVACHOL) 40 MG tablet Take 40 mg by mouth daily.        Marland Kitchen spironolactone (ALDACTONE) 25 MG tablet Take 1 tablet (25 mg total) by mouth daily.  30 tablet  6    Allergies  Allergen Reactions  . Clonidine Derivatives     Dizzy, too sleepy    Past Medical History  Diagnosis Date  . Diabetes mellitus   . Hypertension   . Macular hemorrhage   . Dyslipidemia   . Dilated cardiomyopathy     EF is 45%  . Anemia   . Abnormal nuclear cardiac imaging test     No ischemia, but with scar. EF 45%    No past surgical history on file.  History  Smoking status  . Never Smoker   Smokeless tobacco  . Never Used    History  Alcohol Use  . 15.6 oz/week  . 6 Cans of beer, 20 Shots of liquor per week   6-pack of beer a week/ half a fifth of liquor each week    Family History  Problem Relation Age of Onset  . Heart attack Mother     Died 61  . Heart failure Mother   . Diabetes Mother   . Heart attack Father   . Heart failure Father   . Diabetes Father   . Heart disease Sister     s/p CABG x3 in her mid 94's  . Coronary artery disease Other     Premature    Review of Systems: The review of systems is as above.  All other systems were reviewed and are negative.  Physical Exam: BP 129/87  Pulse 84  Ht 6' (1.829 m)  Wt 220 lb (99.791 kg)  BMI 29.84 kg/m2 Patient is very pleasant and in no acute distress. Skin is warm and dry. Color is normal.  HEENT is unremarkable. Normocephalic/atraumatic. PERRL. Sclera are nonicteric. Neck is supple. No masses. No JVD. Lungs are clear. Cardiac exam shows a regular rate and rhythm. ? S4 noted but softer. Abdomen is soft. Extremities are without edema. Gait and ROM are intact. No gross neurologic deficits noted.  LABORATORY DATA: BMET is pending   Assessment / Plan:

## 2010-12-20 NOTE — Assessment & Plan Note (Signed)
Will see opthalmology on Friday for follow up.

## 2010-12-27 NOTE — Discharge Summary (Signed)
Kevin Soto             ACCOUNT NO.:  [id number]  MEDICAL RECORD NO.:  [id number]  LOCATION:  5005                         FACILITY:  [place]  PHYSICIAN:  Oval Linsey, MD     DATE OF BIRTH:  01-Oct-1960  DATE OF ADMISSION:  11/28/2010 DATE OF DISCHARGE:  12/01/2010                              DISCHARGE SUMMARY   DISCHARGE DIAGNOSES: 1. Hypertensive emergency. 2. Left macular hemorrhage. 3. Diabetes. 4. Dyslipidemia. 5. Ischemic cardiomyopathy.  DISCHARGE MEDICATIONS: 1. Carvedilol 6.25 mg p.o. twice a day with meals. 2. Lisinopril 10 mg by mouth daily. 3. Metformin 500 mg tablets 1000 mg by mouth twice daily with meals. 4. Pravastatin 40 mg by mouth daily. 5. Spironolactone 12.5 mg by mouth daily.  DISPOSITION AND FOLLOWUP:  Kevin Soto is medically stable for discharge.  He was instructed to follow up with [facility] for further hypertension and diabetes management.  PROCEDURES DURING HOSPITALIZATION: 1. EKG diffuse T-wave inversions unremarkable findings.  2. Echo left ventricle revealed septal apical and inferobasal     hypokinesis.  Left ventricle cavity was mildly dilated.  Wall     thickness was increased in a pattern of moderate LVH, systolic     function was mild to moderately reduced, EF was 40-45%.  3. Brain MRI, remote infarcts with no evidence of an acute infarct as     detailed above the scattered blood breakdown products maybe related     to prior episodes of ischemia, infarction, white matter-type     changes probable related to resolved small-vessel disease in the     setting.  4. Head CT age indeterminate infarcts with the right thalamus and     right cerebellum AV subacute given history.  No evidence of     hemorrhage, remote-appearing left cerebellar infarcts, probable     chronic small vessel white matter ischemic changes.  5. Chest x-ray.  Pulmonary vascular congestion and possible mild     interstitial  edema or pneumonitis comparison with any previous     outside the chest x-ray would be helpful if available.  6. Myoview scan.  Left ventricular ejection fraction was calculated at     28%.  Diffuse hypokinesis of the distal half of the left ventricle     electrically nondiagnostic's for ischemia due to baseline changes.     Myoview scan with evidence of extensive scar in the anterior,     anterolateral, anteroseptal inferoseptal, inferolateral and apical     distributions as noted above.  There is minimal ischemia noted in     the anterior region and distal anterolateral region.  CONSULTATIONS: 1. Ophthalmology. 2. Cardiology. 3. Social work. 4. Diabetic nurse educator.  ADMITTING HISTORY AND PHYSICAL AND LABORATORY DATA AS FOLLOWS:    Chief complaint: Blurry vision in the left eye.    Kevin Soto is a 50- year-old man without a significant past medical  history who presents to the ED for blurry vision in the left eye.  He  does not have a documented history of tobacco, diabetes or hypertension  and has not seen a physician in over 10 years.  He reports a family  history significant for  CHF, hypertension, CAD, diabetes and stroke.   He states the symptoms began late Sunday evening yesterday when he was  at home when both eyes began to water.  He reports using Visine drops  because he initially thought his eyes maybe dry.  However, this did  not provide him with any relief.  Over the next few minutes, he states  his vision became blurry and foggy and there was a white spot in his  left eye.  He reports that he cannot see images, but he is able to  see some color in his left eye.  His visual acuity and color vision  in his right eye is intact.  He denies a history of vision problems,  glasses for contact use, tenderness, sourness, headache, facial pain,  trauma or itching.  He also denies any dizziness, weakness, numbness  or chest pain.  He states this has never happened  before.  In addition  to the loss of vision in his left eye, he also reports increased  exertional dyspnea since Sunday.  He denies orthopnea, palpitations,  PND, wheezing, coughing, edema or sputum production.  He has not been  treated for this problem, but resting does not alleviate his  respiratory symptoms.  Prior to this, he reports being able to climb  multiple flights of stairs without becoming short of breath.  He  reports he does not have a primary care doctor.  He admits to alcohol  use, but denies ever having withdrawal symptoms or dependence.  He  also denies taking any medications or illicit drug use.  He is  seeking further evaluation in the ED for his acute vision loss in his left eye.  PHYSICAL EXAMINATION:  VITAL SIGNS:  Blood pressure 235-162/97-133, temperature 98.1, heart  rate 107, respiratory rate 20. GENERAL:  Sitting up in bed, in no acute distress. SKIN:  Bilateral scarring on the lower extremities. HEAD:  Normocephalic. EYES:  Pupils are equal, reactive to light.  Extraocular muscles intact, 20/25 visual acuity in the right eye and color vision is intact less than 20/200 visual acuity in the left eye.  Able to see some colors on the left side. HEART:  Regular rate and rhythm.  No murmurs, rubs or gallops.  Normal S1 and S2. LUNGS:  Clear to auscultation bilaterally.  No wheezing, rales or crackles. EXTREMITIES:  Trace bilateral pitting edema in the lower extremities, pulses 2+ and symmetric. NEUROLOGIC:  Cranial nerves III through XII grossly intact.  Upper and lower extremity sensation intact and strength is 5/5 bilaterally upper and lower extremities.  LABORATORY DATA:  White blood cell count 10.3, hemoglobin 11.1,  hematocrit 32, MCV 84, platelets 350.  Sodium 140, potassium 3.5,  chloride 103, CO2 28, glucose 231, BUN 9, creatinine 0.95, calcium 9.1,  troponin less than 0.3.  CK total 120, CK-MB 3.2, relative index 2.7,  BNP 2396.  HOSPITAL  COURSE: This is a 50 year old man with newly diagnosed hypertension, diabetes and dyslipidemia who was admitted for:  1. Acute vision loss of the left eye.  Kevin Soto presents with poor     visual acuity, but was still able to see some colors in his left     eye.  Ophthalmology consult revealed that he had hypertensive     retinopathy with intraretinal and subretinal macular hemorrhage of     the left eye.  Dr. Baird Cancer recommended he follow up with a     retinal specialist for possible Avastin injection.  2.  Hypertension.  Kevin Soto presented with a hypertensive emergency.     His blood pressure was 233/133 and was BNP 2396.  Three sets of      cardiac enzymes were negative.  He was started on spironolactone,      lisinopril, and carvedilol.  3. Dyslipidemia.  Kevin Soto presented with elevated cholesterol and     LDL.  He was started on pravastatin 40 mg p.o. daily.  4. Diabetes mellitus.  Kevin Soto presented with a hemoglobin A1c of     10.3 and blood sugars greater than 230.  He was started on     metformin.  5. Anemia.  Kevin Soto was admitted with a hemoglobin of 11.1 and     hematocrit of 32.  Iron, B12 and folate studies were within normal     limits.  He should follow up regarding this problem with his     primary care physician.  DISCHARGE VITALS AND LABORATORY DATA:  Temperature 97.9, pulse 71,  respiratory rate 18, blood pressure 154/92, O2 saturation 99% on room  air.  Cholesterol 227, triglycerides 104, HDL 50, LDL 156, VLDL 21,  hemoglobin A1c 10.3, TSH 2.114.   ______________________________ Pryor Ochoa, MD   ______________________________ Oval Linsey, MD   NK/MEDQ  D:  12/04/2010  T:  12/05/2010  Job:  [id number]  Electronically Signed by Pryor Ochoa MD on 12/26/2010 09:11:22 PM Electronically Signed by Oval Linsey  on 12/27/2010 09:13:11 AM

## 2011-01-08 ENCOUNTER — Encounter: Payer: Self-pay | Admitting: Nurse Practitioner

## 2011-01-08 ENCOUNTER — Ambulatory Visit (INDEPENDENT_AMBULATORY_CARE_PROVIDER_SITE_OTHER): Payer: Self-pay | Admitting: Nurse Practitioner

## 2011-01-08 VITALS — BP 140/90 | HR 68 | Ht 72.0 in | Wt 221.0 lb

## 2011-01-08 DIAGNOSIS — R931 Abnormal findings on diagnostic imaging of heart and coronary circulation: Secondary | ICD-10-CM

## 2011-01-08 DIAGNOSIS — I428 Other cardiomyopathies: Secondary | ICD-10-CM

## 2011-01-08 DIAGNOSIS — Z01818 Encounter for other preprocedural examination: Secondary | ICD-10-CM

## 2011-01-08 DIAGNOSIS — E119 Type 2 diabetes mellitus without complications: Secondary | ICD-10-CM

## 2011-01-08 DIAGNOSIS — R9439 Abnormal result of other cardiovascular function study: Secondary | ICD-10-CM

## 2011-01-08 DIAGNOSIS — I420 Dilated cardiomyopathy: Secondary | ICD-10-CM

## 2011-01-08 DIAGNOSIS — I10 Essential (primary) hypertension: Secondary | ICD-10-CM

## 2011-01-08 LAB — CBC WITH DIFFERENTIAL/PLATELET
Basophils Absolute: 0 10*3/uL (ref 0.0–0.1)
Basophils Relative: 0.4 % (ref 0.0–3.0)
Eosinophils Absolute: 0.4 10*3/uL (ref 0.0–0.7)
Eosinophils Relative: 4.3 % (ref 0.0–5.0)
HCT: 35.2 % — ABNORMAL LOW (ref 39.0–52.0)
Hemoglobin: 11.4 g/dL — ABNORMAL LOW (ref 13.0–17.0)
Lymphocytes Relative: 26.6 % (ref 12.0–46.0)
Lymphs Abs: 2.5 10*3/uL (ref 0.7–4.0)
MCHC: 32.4 g/dL (ref 30.0–36.0)
MCV: 88.1 fl (ref 78.0–100.0)
Monocytes Absolute: 0.7 10*3/uL (ref 0.1–1.0)
Monocytes Relative: 7.9 % (ref 3.0–12.0)
Neutro Abs: 5.7 10*3/uL (ref 1.4–7.7)
Neutrophils Relative %: 60.8 % (ref 43.0–77.0)
Platelets: 335 10*3/uL (ref 150.0–400.0)
RBC: 4 Mil/uL — ABNORMAL LOW (ref 4.22–5.81)
RDW: 13.6 % (ref 11.5–14.6)
WBC: 9.4 10*3/uL (ref 4.5–10.5)

## 2011-01-08 LAB — BASIC METABOLIC PANEL
BUN: 28 mg/dL — ABNORMAL HIGH (ref 6–23)
CO2: 29 mEq/L (ref 19–32)
Calcium: 9.6 mg/dL (ref 8.4–10.5)
Chloride: 102 mEq/L (ref 96–112)
Creatinine, Ser: 1.4 mg/dL (ref 0.4–1.5)
GFR: 71.22 mL/min (ref >60.00)
Glucose, Bld: 217 mg/dL — ABNORMAL HIGH (ref 70–99)
Potassium: 4.1 mEq/L (ref 3.5–5.1)
Sodium: 140 mEq/L (ref 135–145)

## 2011-01-08 LAB — PROTIME-INR
INR: 1.2 ratio — ABNORMAL HIGH (ref 0.8–1.0)
Prothrombin Time: 13.1 s — ABNORMAL HIGH (ref 10.2–12.4)

## 2011-01-08 LAB — APTT: aPTT: 29.2 s — ABNORMAL HIGH (ref 21.7–28.8)

## 2011-01-08 NOTE — Assessment & Plan Note (Signed)
He is on Metformin. This will be held after Wednesday's am dose.

## 2011-01-08 NOTE — Assessment & Plan Note (Signed)
Blood pressure has improved with his current regimen. Will leave him on his current medicines.

## 2011-01-08 NOTE — Progress Notes (Signed)
    Kevin Soto Date of Birth: 12/03/1960   History of Present Illness: Kevin Soto is seen back today for a follow up visit. He is seen for [name]. This is a 2 1/2 week check. He has HTN and has had prior abnormal stress testing with an EF 45% and scar. No ischemia noted. Blood pressure has improved with his current regimen. For the most part he is below 140/90 at home. He feels good on this regimen. No chest pain or shortness of breath. He is not dizzy or lightheaded.   Current Outpatient Prescriptions on File Prior to Visit  Medication Sig Dispense Refill  . carvedilol (COREG) 12.5 MG tablet Take 2 tablets (25 mg total) by mouth 2 (two) times daily.  60 tablet  6  . hydrochlorothiazide (MICROZIDE) 12.5 MG capsule Take 2 capsules (25 mg total) by mouth daily.  30 capsule  3  . lisinopril (PRINIVIL,ZESTRIL) 20 MG tablet Take 1 tablet (20 mg total) by mouth 2 (two) times daily.  60 tablet  6  . metFORMIN (GLUCOPHAGE) 500 MG tablet Take 500 mg by mouth 2 (two) times daily with a meal.       . pravastatin (PRAVACHOL) 40 MG tablet Take 40 mg by mouth daily.        Marland Kitchen spironolactone (ALDACTONE) 25 MG tablet Take 1 tablet (25 mg total) by mouth daily.  30 tablet  6    Allergies  Allergen Reactions  . Clonidine Derivatives     Dizzy, too sleepy    Past Medical History  Diagnosis Date  . Diabetes mellitus   . Hypertension   . Macular hemorrhage   . Dyslipidemia   . Dilated cardiomyopathy     EF is 45%  . Anemia   . Abnormal nuclear cardiac imaging test     No ischemia, but with scar. EF 45%    History reviewed. No pertinent past surgical history.  History  Smoking status  . Never Smoker   Smokeless tobacco  . Never Used    History  Alcohol Use  . 15.6 oz/week  . 6 Cans of beer, 20 Shots of liquor per week    6-pack of beer a week/ half a fifth of liquor each week    Family History  Problem Relation Age of Onset  . Heart attack Mother     Died 61  . Heart failure  Mother   . Diabetes Mother   . Heart attack Father   . Heart failure Father   . Diabetes Father   . Heart disease Sister     s/p CABG x3 in her mid 19's  . Coronary artery disease Other     Premature    Review of Systems: The review of systems is as above.  All other systems were reviewed and are negative.  Physical Exam: BP 140/90  Pulse 68  Ht 6' (1.829 m)  Wt 221 lb (100.245 kg)  BMI 29.97 kg/m2 Patient is very pleasant and in no acute distress. Skin is warm and dry. Color is normal.  HEENT is unremarkable. Normocephalic/atraumatic. PERRL. Sclera are nonicteric. Neck is supple. No masses. No JVD. Lungs are clear. Cardiac exam shows a regular rate and rhythm. Possible S4 noted still. Abdomen is soft. Extremities are without edema. Gait and ROM are intact. No gross neurologic deficits noted.  LABORATORY DATA: PENDING   Assessment / Plan:

## 2011-01-08 NOTE — Assessment & Plan Note (Addendum)
Has had abnormal nuclear. EF is 45%. Has had history of heavy alcohol use. Cardiac cath is scheduled for [name] for Thursday to further define his coronary status. The procedure, risks and benefits have been reviewed with him and he is willing to proceed.

## 2011-01-08 NOTE — Patient Instructions (Addendum)
Stay on your current medicines. Continue to monitor your blood pressure at home. We are going to arrange for a heart catheterization with [name]. Go to [facility] this Thursday August 23rd. Go to Short Stay at 7:30am. No food or drink after midnight on Wednesday. Take your morning medicines with a sip of water Stop your Metformin after Wednesday morning's dose. You will be told when to resume.     Angiography Angiography is a procedure used to look at the blood vessels (arteries) which carry the blood to different parts of your body. In this procedure a dye is injected through a catheter (a long, hollow tube about the size of a piece of cooked spaghetti) into an artery and x-rays are taken. The x-rays will show if there is a blockage or problem in a blood vessel.  PREPARATION FOR THE PROCEDURE  Let your caregiver know if you have had an allergy to dyes used in x-ray if you have ever had kidney problems or failure.   Do not eat or drink starting from midnight up to the time of the procedure, or as directed.   You may drink enough water to take your medications the morning of the procedure if you were instructed to do so.   You should be at the hospital or outpatient facility where the procedure is to be done 2 hours prior to the procedure or as directed.  PROCEDURE: 1. You may be given a medication to help you relax before and during the procedure through an IV in your hand or arm.  2. A local anesthetic to make the area numb may be used before inserting the catheter.  3. You will be prepared for the procedure by washing and shaving the area where the catheter will be inserted. This is usually done in the groin but may be done in the fold of your arm by your elbow.  4. A specially trained doctor will insert the catheter with a guide wire into an artery. This is guided under a special type of x-ray (fluoroscopy) to the blood vessel being examined.  5. Special dye is then injected  and x-rays are taken. These will show where any narrowing or blockages are located.  AFTER THE PROCEDURE  After the procedure you will be kept in bed for several hours.   The access site will be watched and you will be checked frequently.   Blood tests, other x-rays and an EKG may be done.   You may stay in the hospital overnight for observation.  SEEK IMMEDIATE MEDICAL CARE IF:  You develop chest pain, shortness of breath, feel faint, or pass out.   There is bleeding, swelling, or drainage from the catheter insertion site.   You develop pain, discoloration, coldness, or severe bruising in the leg or arm, or area where the catheter was inserted.   An oral temperature above 102 develops.  Document Released: 02/14/2005 Document Re-Released: 09/06/2007 [facility] Patient Information 2011 [place].

## 2011-01-09 NOTE — Progress Notes (Signed)
Lm

## 2011-01-10 ENCOUNTER — Telehealth: Payer: Self-pay | Admitting: Nurse Practitioner

## 2011-01-10 NOTE — Telephone Encounter (Signed)
Regarding surgery for tomorrow with [name], pt returning call from [place], [place] only

## 2011-01-10 NOTE — Telephone Encounter (Signed)
Message copied by Hetty Blend on Wed Jan 10, 2011  2:06 PM ------      Message from: Burtis Junes      Created: Mon Jan 08, 2011  3:26 PM       Ok to report. For cath on Thursday with [name]. Needs to show up earlier for his cath on Friday for fluids to get started. Have him arrive at [place].

## 2011-01-10 NOTE — Telephone Encounter (Signed)
Notified of lab results. Advised to arrive at [facility] short stay at 6 am tomorrow for IV's. Cath by [facility] tomorrow.

## 2011-01-11 ENCOUNTER — Ambulatory Visit (HOSPITAL_COMMUNITY)
Admission: RE | Admit: 2011-01-11 | Discharge: 2011-01-12 | Disposition: A | Discharge status: Home / Self Care | Payer: Self-pay | Source: Ambulatory Visit | Attending: Cardiology | Admitting: Cardiology

## 2011-01-11 DIAGNOSIS — Z01810 Encounter for preprocedural cardiovascular examination: Secondary | ICD-10-CM | POA: Insufficient documentation

## 2011-01-11 DIAGNOSIS — I2510 Atherosclerotic heart disease of native coronary artery without angina pectoris: Secondary | ICD-10-CM

## 2011-01-11 DIAGNOSIS — I2589 Other forms of chronic ischemic heart disease: Secondary | ICD-10-CM | POA: Insufficient documentation

## 2011-01-11 DIAGNOSIS — I10 Essential (primary) hypertension: Secondary | ICD-10-CM | POA: Insufficient documentation

## 2011-01-11 DIAGNOSIS — E119 Type 2 diabetes mellitus without complications: Secondary | ICD-10-CM | POA: Insufficient documentation

## 2011-01-11 DIAGNOSIS — F1010 Alcohol abuse, uncomplicated: Secondary | ICD-10-CM | POA: Insufficient documentation

## 2011-01-11 DIAGNOSIS — Z01812 Encounter for preprocedural laboratory examination: Secondary | ICD-10-CM | POA: Insufficient documentation

## 2011-01-11 DIAGNOSIS — E785 Hyperlipidemia, unspecified: Secondary | ICD-10-CM | POA: Insufficient documentation

## 2011-01-11 DIAGNOSIS — Z23 Encounter for immunization: Secondary | ICD-10-CM | POA: Insufficient documentation

## 2011-01-11 LAB — GLUCOSE, CAPILLARY
Glucose-Capillary: 184 mg/dL — ABNORMAL HIGH (ref 70–99)
Glucose-Capillary: 195 mg/dL — ABNORMAL HIGH (ref 70–99)

## 2011-01-11 LAB — PLATELET COUNT: Platelets: 324 10*3/uL (ref 150–400)

## 2011-01-12 ENCOUNTER — Encounter: Payer: Self-pay | Admitting: Nurse Practitioner

## 2011-01-12 LAB — CBC
HCT: 29.9 % — ABNORMAL LOW (ref 39.0–52.0)
MCH: 27.9 pg (ref 26.0–34.0)
MCHC: 34.1 g/dL (ref 30.0–36.0)
MCV: 81.9 fL (ref 78.0–100.0)
Platelets: 318 10*3/uL (ref 150–400)
RDW: 12.5 % (ref 11.5–15.5)

## 2011-01-12 LAB — BASIC METABOLIC PANEL
BUN: 18 mg/dL (ref 6–23)
CO2: 29 mEq/L (ref 19–32)
Calcium: 9.5 mg/dL (ref 8.4–10.5)
Chloride: 103 mEq/L (ref 96–112)
Creatinine, Ser: 1.1 mg/dL (ref 0.50–1.35)

## 2011-01-16 NOTE — Cardiovascular Report (Signed)
  NAMEIKER, Kevin NO.:  [id number]  MEDICAL RECORD NO.:  [id number]  LOCATION:  6529                         FACILITY:  [facility]  PHYSICIAN:  Lauree Chandler, MDDATE OF BIRTH:  07-18-1960  DATE OF PROCEDURE:  01/11/2011 DATE OF DISCHARGE:                           CARDIAC CATHETERIZATION   PRIMARY CARDIOLOGIST:  Minus Breeding, MD, [facility]  PROCEDURE PERFORMED:  Successful percutaneous transluminal coronary angioplasty with placement of two drug-eluting stents in the mid right coronary artery.  OPERATOR:  Lauree Chandler, MD  INDICATION:  This is a 50 year old male who underwent diagnostic catheterization earlier today and was found to have a subtotal occlusion of the mid right coronary artery.  The patient was also found to have total occlusion of a left anterior descending artery that is felt to be chronic.  [name], asked me to perform intervention of the subtotally occluded mid right coronary artery.  PROCEDURE IN DETAIL:  When I entered the case, the patient had a 5- [place] sheath present in the right radial artery.  The sheath was changed out for a 6-French sheath.  We were unsure if we would able to cross the subtotal occlusion, so we decided use heparin for anticoagulation.  We also gave the patient a double bolus of Integrilin and started a drip.  Once the ACT was greater than 200, we engaged the native right coronary artery with a JR-4 guiding catheter.  We then passed a cougar intracoronary wire easily down the right coronary artery through the subtotal occlusion into the distal vessel.  The patient was given 600 mg of Plavix at this time.  A 2.5 x 15-mm balloon was inflated three times in the mid and distal vessel.  A 2.5 x 38-mm Promus element drug-eluting stent was carefully positioned in the distal vessel and deployed without difficulty.  We were unable to cover the entire area of disease with one stent.  A 2.75 x 12-mm Promus  element drug-eluting stent was carefully positioned on the proximal edge of the previously placed stent and deployed without difficulty.  The 2.5 x 20-mm noncompliant balloon was used to post dilate the distal stented segment. A 2.75 x 20-mm noncompliant balloon was used to post dilate the proximal stent and segment.  Both of these balloons were inflated twice.  The stenosis was taken from 99% down to 0%.  There was an excellent angiographic result with excellent flow into the distal vessel.  IMPRESSION:  Successful percutaneous transluminal coronary angioplasty with placement two overlapping drug-eluting stents in the mid and distal right coronary artery.  RECOMMENDATIONS:  The patient will be continued on aspirin and Plavix for at least 1 year.  We will also continue his beta-blocker and statin.     Lauree Chandler, MD     CM/MEDQ  D:  01/11/2011  T:  01/11/2011  Job:  [id number]  cc:   Minus Breeding, MD, [facility]  Electronically Signed by Lauree Chandler MD on 01/16/2011 03:16:08 PM

## 2011-01-16 NOTE — Consult Note (Signed)
NAMEELIECER, Kevin Soto             ACCOUNT NO.:  [id number]  MEDICAL RECORD NO.:  [id number]  LOCATION:  5005                         FACILITY:  [facility]  PHYSICIAN:  Shaune Pascal. Bensimhon, MDDATE OF BIRTH:  [birth year]  DATE OF CONSULTATION:  11/30/2010 DATE OF DISCHARGE:                                CONSULTATION   PRIMARY CARDIOLOGIST:  New.  PRIMARY CARE PROVIDER:  None, currently being seen by the [facility] Resident Service.  REASON FOR CONSULTATION:  Decreased ejection fraction.  HISTORY OF PRESENT ILLNESS:  This is a 50 year old African American gentleman without known medical history who has not seen a physician in greater than 10 years.  The patient presented to the emergency department 48 hours ago with onset of left-sided blurred vision.  In the emergency room, the patient was found to be hypotensive with a blood pressure of 230/130.  The patient was admitted for hypertensive urgency. His blood pressures have improved greatly with the treatment.  MRI of the brain showed traumatic infarct, but though evidence of acute infarct.  Ophthalmology has consulted on the patient noting that he had macular hemorrhages.  He will follow up with him as an outpatient for possible retinal injection.  He is currently on eye drops at this time. During the patient's workup, he has been found to be diabetic with a hemoglobin A1c of 10.3.  His cholesterol was also elevated with total cholesterol of 277.  An echocardiogram was also obtained that showed septal, apical, and inferobasal hypokinesis.  His systolic function of the left ventricle was moderately reduced, 40-45%.  Pro BNP was 2396 and he also had pulmonary vascular congestion on chest x-ray.  Cardiology was asked to evaluate the patient for decreased ejection fraction.  In speaking with the patient, he has noticed increased shortness of breath with minimal activity over the past week.  This included getting up 6 flights of stairs  becoming difficult.  Further discussion, he has noted decreased exercise tolerance over the last 6 months, although he has continued to try and push himself, this includes biking, squat and jumping jack.  Dyspnea has resolved at rest.  He denies lower extremity edema, orthopnea, or PND.  He also denies any chest pain, recent fever, chills, nausea, vomiting, or diaphoresis.  The patient does have a strong family history for coronary artery disease.  PAST MEDICAL HISTORY:  None, but on arrival, the patient was found to be diabetic, hypertensive, and with macular hemorrhage.  PAST SURGICAL HISTORY:  None.  SOCIAL HISTORY:  The patient lives in [place] with his cousin brother.  He deals production stages.  He has no children.  He denies tobacco abuse, but does drink approximately a 6-pack a beer a week as well as half a fifth of liquor a week.  Denies drug abuse.  He does exercise 3 times a week.  FAMILY HISTORY:  Positive for premature coronary artery disease.  His sister is now status post CABG x3 in her mid 41s.  His mother and father both began to have myocardial infarctions in their 12s and 29s.  His mother died at the age of 79 from congestive heart failure.  His father died at the age of 72  from congestive heart failure.  ALLERGIES:  No known drug allergies.  INPATIENT MEDICATIONS: 1. Coreg 6.25 mg twice daily. 2. Lovenox 40 mg subcu nightly. 3. NovoLog sliding scale insulin. 4. Lisinopril 10 mg daily. 5. Metformin 500 mg daily. 6. Zocor 20 mg daily.  REVIEW OF SYSTEMS:  All pertinent positives and negatives as stated in HPI.  All other systems have been reviewed and are negative.  PHYSICAL EXAMINATION:  VITAL SIGNS:  Temperature 98, pulse 67, respirations 19, blood pressure 144 to 175 over 88 to 96, O2 saturation 97% on room air.  GENERAL:  This is a pleasant middle-aged gentleman. He is in no acute distress. HEENT:  Normal.  Neck is supple without bruit or  JVD. HEART:  Regular rate and rhythm with S1 and S2.  There is a split S2 versus an S3 heard.  No murmur or rub noted.  Pulses 2+ and equal bilaterally. LUNGS:  Clear to auscultation bilaterally without wheezes, rales, or rhonchi. ABDOMEN:  Soft, nontender, positive bowel sounds x4. EXTREMITIES:  No clubbing, cyanosis, or edema. MUSCULOSKELETAL:  No joint deformities or effusions. NEUROLOGIC:  Alert and oriented x3, cranial nerves II-XII grossly intact.  Chest x-ray showing pulmonary vascular congestion and possible mild interstitial edema or pneumonitis.  EKG showing sinus rhythm at a rate of 83 beats per minute.  Left ventricular hypertrophy noted.  No prior tracing for comparison.  LABORATORY DATA:  WBC of 10.3, hemoglobin 11.1, hematocrit 32, platelets 350.  Sodium 140, potassium 3.5, BUN 9, creatinine 0.95, BNP 2396. Cardiac enzymes negative x3, hemoglobin A1c 10.3, total cholesterol 277, triglycerides 104, HDL 50, LDL 156.  ASSESSMENT AND PLAN:  This is a 50 year old African American gentleman who presents with blurred vision that has been found to be secondary to macular hemorrhage in the setting of uncontrolled hypertension as well as uncontrolled diabetes.  The patient's echocardiogram shows regional wall motion abnormalities and a depressed left ventricular ejection fraction.  With patient's wall motion abnormalities as well as cardiac risk factors, I suspect that this is ischemic cardiomyopathy in nature. However, given patient's macular hemorrhage, he is currently not a candidate for catheterization until this is resolved.  Therefore, we recommend continuing Coreg, lisinopril, and a statin.  We will add spironolactone for his systolic heart failure and plan elective scan Myoview to further risk stratify.  We will plan for catheterization and aspirin when macular hemorrhage is stable.  This can be completed in outpatient setting.  Thank you for this consultation.  I  will continue to follow along with you.     Cecille Amsterdam, PA-C   ______________________________ Shaune Pascal. Bensimhon, MD    NB/MEDQ  D:  11/30/2010  T:  12/01/2010  Job:  [id number]  Electronically Signed by Pennie Rushing P.A. on 12/26/2010 02:13:02 PM Electronically Signed by Glori Bickers MD on 01/16/2011 05:22:41 PM

## 2011-01-18 ENCOUNTER — Telehealth: Payer: Self-pay | Admitting: Cardiology

## 2011-01-18 ENCOUNTER — Telehealth: Payer: Self-pay | Admitting: Nurse Practitioner

## 2011-01-18 NOTE — Telephone Encounter (Signed)
Spoke w/sister-Andrea. Wanted referral to cardiac rehab. Advised to call Dr. Rosezella [place] office since he did the Cath. Scheduled to see Scott on 9/7. Gave her Phone number.

## 2011-01-18 NOTE — Telephone Encounter (Signed)
MC told pt that he needs a referral for the Rehab Program.  Please call back to discuss.  Looking for chart.

## 2011-01-18 NOTE — Telephone Encounter (Signed)
Faxed LOV, Stress, EKG, Discharge Summary & Labs to [facility] at the [place] ([phone]).

## 2011-01-18 NOTE — Telephone Encounter (Signed)
Faxed Cath to [facility] at the [place] ([phone]).

## 2011-01-26 ENCOUNTER — Encounter: Payer: Self-pay | Admitting: Physician Assistant

## 2011-01-26 ENCOUNTER — Ambulatory Visit (INDEPENDENT_AMBULATORY_CARE_PROVIDER_SITE_OTHER): Payer: Self-pay | Admitting: Physician Assistant

## 2011-01-26 VITALS — BP 126/76 | HR 68 | Ht 72.0 in | Wt 219.6 lb

## 2011-01-26 DIAGNOSIS — H4312 Vitreous hemorrhage, left eye: Secondary | ICD-10-CM | POA: Insufficient documentation

## 2011-01-26 DIAGNOSIS — I428 Other cardiomyopathies: Secondary | ICD-10-CM

## 2011-01-26 DIAGNOSIS — H4310 Vitreous hemorrhage, unspecified eye: Secondary | ICD-10-CM

## 2011-01-26 DIAGNOSIS — I2510 Atherosclerotic heart disease of native coronary artery without angina pectoris: Secondary | ICD-10-CM

## 2011-01-26 DIAGNOSIS — I420 Dilated cardiomyopathy: Secondary | ICD-10-CM

## 2011-01-26 DIAGNOSIS — I2589 Other forms of chronic ischemic heart disease: Secondary | ICD-10-CM

## 2011-01-26 DIAGNOSIS — I25119 Atherosclerotic heart disease of native coronary artery with unspecified angina pectoris: Secondary | ICD-10-CM | POA: Insufficient documentation

## 2011-01-26 NOTE — Assessment & Plan Note (Signed)
He is on a good medical regimen.  No signs or symptoms of acute CHF.

## 2011-01-26 NOTE — Assessment & Plan Note (Signed)
Doing well post PCI.  Unfortunately, he is not taking Plavix.  To make matters more complicated, he had a left eye vitrectomy yesterday.  I suspect he will be better off on Effient as we can provide him samples and get him enrolled in the assistance program.  I d/w [name] and he agreed.  I have provided him samples. He will take a loading dose of 60 mg today and then continue on 10 mg QD.  He will remain on ASA 81 mg QD.  He will follow up with [name] or me in 3 weeks.

## 2011-01-26 NOTE — Assessment & Plan Note (Addendum)
I have left a message at Dr. Baird Cancer office to apprise him of Kevin Soto medications.  I did inform Kevin Soto that he is at increased risk of bleeding with his recent procedure.

## 2011-01-26 NOTE — Progress Notes (Signed)
History of Present Illness: Primary Cardiologist:  [name]   Kevin Soto is a 50 y.o. male who presents for post hospital follow up.  He has a history of hypertension, diabetes and hyperlipidemia.  He was admitted in July with hypertensive emergency complicated by a left macular hemorrhage.  EF was noted to be 40-45% by echocardiogram.  Myoview was done and was abnormal with extensive scar in the anterior, anterolateral, inferoseptal, inferior, inferolateral and apical walls.  There was minimal ischemia.  He followed up in the office with [name].  He was eventually set up for cardiac catheterization.  This was performed on 8/23 and demonstrated:  LM ok, mLAD occluded with trivial collats from right AM and dRCA, mDx (small) 80%, RI 25%, tiny branch of OM 90%, mAM 30-40%, RCA sub-totally occluded, then 80%, EF 40%, inf HK.  He underwent PCI with [name] with placement of a Promus DES to the mRCA x 2.  He tolerated the procedure well.  Labs:  Hgb 10.2, K 3.8, BUN 18, Creat 1.1.  The patient denies chest pain, shortness of breath, syncope, orthopnea, PND or significant pedal edema.  He states he never got a Rx for Plavix at discharge.  He is taking ASA 81 mg QD.  Of note, he had a vitrectomy of his left eye yesterday with [name].    Past Medical History  Diagnosis Date  . Diabetes mellitus   . Hypertension   . Macular hemorrhage   . Dyslipidemia   . Ischemic cardiomyopathy     echo 7/12: EF 40-45%, septal, apical, inf basal HK, mod LVH, mild MR, mild LAE  . Anemia   . CAD (coronary artery disease)     cath 8/12:  LM ok, mLAD occluded with trivial collats from right AM and dRCA, mDx (small) 80%, RI 25%, tiny branch of OM 90%, mAM 30-40%, RCA sub-totally occluded, then 80%, EF 40%, inf HK.  He underwent PCI with [name] with placement of a Promus DES to the mRCA x 2  . History of stroke     evidence of CVA on MRI in past  . Anemia   . History of alcohol abuse      Current Outpatient Prescriptions  Medication Sig Dispense Refill  . carvedilol (COREG) 12.5 MG tablet Take 2 tablets (25 mg total) by mouth 2 (two) times daily.  60 tablet  6  . hydrochlorothiazide (MICROZIDE) 12.5 MG capsule Take 2 capsules (25 mg total) by mouth daily.  30 capsule  3  . lisinopril (PRINIVIL,ZESTRIL) 20 MG tablet Take 1 tablet (20 mg total) by mouth 2 (two) times daily.  60 tablet  6  . metFORMIN (GLUCOPHAGE) 500 MG tablet Take 500 mg by mouth 2 (two) times daily with a meal.       . pravastatin (PRAVACHOL) 40 MG tablet Take 40 mg by mouth daily.        Marland Kitchen spironolactone (ALDACTONE) 25 MG tablet Take 1 tablet (25 mg total) by mouth daily.  30 tablet  6  . aspirin EC 81 MG tablet Take 1 tablet (81 mg total) by mouth daily.      . prasugrel (EFFIENT) 10 MG TABS TODAY WHEN YOU GET HOME YOU ARE TO TAKE 6 TABLETS TO = 60 MG TODAY; AFTER TODAY YOU WILL TAKE ONLY 1 TABLET DAILY.        Allergies: Allergies  Allergen Reactions  . Clonidine Derivatives     Dizzy, too sleepy  Vital Signs: BP 126/76  Pulse 68  Ht 6' (1.829 m)  Wt 219 lb 9.6 oz (99.61 kg)  BMI 29.78 kg/m2  PHYSICAL EXAM: Well nourished, well developed, in no acute distress HEENT: normal Neck: no JVD Cardiac:  normal S1, S2; RRR; no murmur Lungs:  clear to auscultation bilaterally, no wheezing, rhonchi or rales Abd: soft, nontender, no hepatomegaly Ext: no edema; right radial site without hematoma or bruit Skin: warm and dry Neuro:  CNs 2-12 intact, no focal abnormalities noted  EKG:  Sinus rhythm, heart rate 68, normal axis, inferior Q waves, T wave inversions in leads 2, 3, aVF and V4-V6, no significant changes  ASSESSMENT AND PLAN:

## 2011-01-26 NOTE — Patient Instructions (Signed)
Your physician recommends that you schedule a follow-up appointment in: 02/16/11 @ 11:45 to see [name]  Your physician has recommended you make the following change in your medication: START ASPIRIN 81 MG YOU CAN PICK THIS UP OVER THE COUNTER;  START EFFIENT 10 MG; TODAY ONLY YOU WILL TAKE 60 MG THIS WILL BE A TOTAL OF 6 TABLETS; AFTER TODAY YOU WILL TAKE ONLY 1 TABLET DAILY. ANY QUESTIONS PLEASE CALL [phone].  YOU HAVE BEEN GIVEN A [facility] FORM TO FILL OUT FOR ASSISTANCE WITH YOUR EFFIENT, PLEASE FILL THIS OUT AND RETURN TO ME (CAROL FIATO, CMA) WITH THE REQUESTED FINANCIAL INFORMATION PER [facility].

## 2011-02-12 ENCOUNTER — Encounter (HOSPITAL_COMMUNITY): Payer: Self-pay

## 2011-02-14 ENCOUNTER — Encounter (HOSPITAL_COMMUNITY): Payer: Self-pay

## 2011-02-16 ENCOUNTER — Encounter: Payer: Self-pay | Admitting: Cardiology

## 2011-02-16 ENCOUNTER — Encounter (HOSPITAL_COMMUNITY): Payer: Self-pay

## 2011-02-16 ENCOUNTER — Ambulatory Visit (INDEPENDENT_AMBULATORY_CARE_PROVIDER_SITE_OTHER): Payer: Self-pay | Admitting: Cardiology

## 2011-02-16 DIAGNOSIS — I420 Dilated cardiomyopathy: Secondary | ICD-10-CM

## 2011-02-16 DIAGNOSIS — E785 Hyperlipidemia, unspecified: Secondary | ICD-10-CM

## 2011-02-16 DIAGNOSIS — I428 Other cardiomyopathies: Secondary | ICD-10-CM

## 2011-02-16 DIAGNOSIS — E1169 Type 2 diabetes mellitus with other specified complication: Secondary | ICD-10-CM | POA: Insufficient documentation

## 2011-02-16 DIAGNOSIS — I2510 Atherosclerotic heart disease of native coronary artery without angina pectoris: Secondary | ICD-10-CM

## 2011-02-16 DIAGNOSIS — E7800 Pure hypercholesterolemia, unspecified: Secondary | ICD-10-CM

## 2011-02-16 DIAGNOSIS — I10 Essential (primary) hypertension: Secondary | ICD-10-CM

## 2011-02-16 NOTE — Patient Instructions (Signed)
Please have a fasting lipid and basic metabolic panel with results faxed to [phone].  The current medical regimen is effective;  continue present plan and medications.  Follow up in 6 month with Dr Percival Spanish.  You will receive a letter in the mail 2 months before you are due.  Please call us when you receive this letter to schedule your follow up appointment.

## 2011-02-16 NOTE — Assessment & Plan Note (Signed)
He has a mildly reduced EF.  He will continue the meds as listed and I will repeat this in the future.

## 2011-02-16 NOTE — Progress Notes (Signed)
HPI The patient presents for follow up of CAD.  Since I last saw him he has done well. The patient denies any new symptoms such as chest discomfort, neck or arm discomfort. There has been no new shortness of breath, PND or orthopnea. There have been no reported palpitations, presyncope or syncope.  He is walking about five days per week.  He is having his DM followed more closely and he is checking his sugars at home.  Allergies  Allergen Reactions  . Clonidine Derivatives     Dizzy, too sleepy    Current Outpatient Prescriptions  Medication Sig Dispense Refill  . aspirin EC 81 MG tablet Take 1 tablet (81 mg total) by mouth daily.      . bimatoprost (LUMIGAN) 0.01 % SOLN 1 drop at bedtime.        . carvedilol (COREG) 12.5 MG tablet Take 2 tablets (25 mg total) by mouth 2 (two) times daily.  60 tablet  6  . dorzolamide-timolol (COSOPT) 22.3-6.8 MG/ML ophthalmic solution Place 1 drop into the left eye 2 (two) times daily.        . hydrochlorothiazide (MICROZIDE) 12.5 MG capsule Take 2 capsules (25 mg total) by mouth daily.  30 capsule  3  . lisinopril (PRINIVIL,ZESTRIL) 20 MG tablet Take 1 tablet (20 mg total) by mouth 2 (two) times daily.  60 tablet  6  . metFORMIN (GLUCOPHAGE) 500 MG tablet Take 500 mg by mouth 2 (two) times daily with a meal.       . oxyCODONE-acetaminophen (PERCOCET) 5-325 MG per tablet Take 1 tablet by mouth every 6 (six) hours as needed.        . prasugrel (EFFIENT) 10 MG TABS TODAY WHEN YOU GET HOME YOU ARE TO TAKE 6 TABLETS TO = 60 MG TODAY; AFTER TODAY YOU WILL TAKE ONLY 1 TABLET DAILY.      . pravastatin (PRAVACHOL) 40 MG tablet Take 40 mg by mouth daily.        Marland Kitchen spironolactone (ALDACTONE) 25 MG tablet Take 1 tablet (25 mg total) by mouth daily.  30 tablet  6    Past Medical History  Diagnosis Date  . Diabetes mellitus   . Hypertension   . Macular hemorrhage   . Dyslipidemia   . Ischemic cardiomyopathy     echo 7/12: EF 40-45%, septal, apical, inf basal HK,  mod LVH, mild MR, mild LAE  . Anemia   . CAD (coronary artery disease)     cath 8/12:  LM ok, mLAD occluded with trivial collats from right AM and dRCA, mDx (small) 80%, RI 25%, tiny branch of OM 90%, mAM 30-40%, RCA sub-totally occluded, then 80%, EF 40%, inf HK.  He underwent PCI with [name] with placement of a Promus DES to the mRCA x 2  . History of stroke     evidence of CVA on MRI in past  . Anemia   . History of alcohol abuse     No past surgical history on file.  ROS:  Joint pains.  Otherwise as stated in the HPI and negative for all other systems.  PHYSICAL EXAM BP 112/78  Pulse 84  Ht 6' (1.829 m)  Wt 214 lb (97.07 kg)  BMI 29.02 kg/m2 GENERAL:  Well appearing HEENT:  Pupils equal round and reactive, fundi not visualized, oral mucosa unremarkable NECK:  No jugular venous distention, waveform within normal limits, carotid upstroke brisk and symmetric, no bruits, no thyromegaly LYMPHATICS:  No cervical, inguinal adenopathy  LUNGS:  Clear to auscultation bilaterally BACK:  No CVA tenderness CHEST:  Unremarkable HEART:  PMI not displaced or sustained,S1 and S2 within normal limits, no S3, no clicks, no rubs, no , positive S4 ABD:  Flat, positive bowel sounds normal in frequency in pitch, no bruits, no rebound, no guarding, no midline pulsatile mass, no hepatomegaly, no splenomegaly EXT:  2 plus pulses throughout, no edema, no cyanosis no clubbing SKIN:  No rashes no nodules NEURO:  Cranial nerves II through XII grossly intact, motor grossly intact throughout PSYCH:  Cognitively intact, oriented to person place and time  ASSESSMENT AND PLAN

## 2011-02-16 NOTE — Assessment & Plan Note (Signed)
I will send him for a lipid profile.  I suspect that he will need med titration with a goal LDL less than 70 and HDL greater than 40.

## 2011-02-16 NOTE — Assessment & Plan Note (Signed)
The patient has no new sypmtoms.  No further cardiovascular testing is indicated.  We will continue with aggressive risk reduction and meds as listed.  

## 2011-02-16 NOTE — Assessment & Plan Note (Signed)
The blood pressure is at target. No change in medications is indicated. We will continue with therapeutic lifestyle changes (TLC).  

## 2011-02-19 ENCOUNTER — Encounter (HOSPITAL_COMMUNITY): Payer: Self-pay

## 2011-02-21 ENCOUNTER — Encounter (HOSPITAL_COMMUNITY): Payer: Self-pay

## 2011-02-22 ENCOUNTER — Other Ambulatory Visit: Payer: Self-pay | Admitting: Internal Medicine

## 2011-02-22 ENCOUNTER — Telehealth: Payer: Self-pay | Admitting: Cardiology

## 2011-02-22 ENCOUNTER — Other Ambulatory Visit: Payer: Self-pay

## 2011-02-22 ENCOUNTER — Other Ambulatory Visit: Payer: Self-pay | Admitting: *Deleted

## 2011-02-22 ENCOUNTER — Ambulatory Visit (INDEPENDENT_AMBULATORY_CARE_PROVIDER_SITE_OTHER): Payer: Self-pay | Admitting: Dietician

## 2011-02-22 VITALS — Ht 72.0 in | Wt 218.4 lb

## 2011-02-22 DIAGNOSIS — I10 Essential (primary) hypertension: Secondary | ICD-10-CM

## 2011-02-22 DIAGNOSIS — E7800 Pure hypercholesterolemia, unspecified: Secondary | ICD-10-CM

## 2011-02-22 DIAGNOSIS — E119 Type 2 diabetes mellitus without complications: Secondary | ICD-10-CM

## 2011-02-22 DIAGNOSIS — I2510 Atherosclerotic heart disease of native coronary artery without angina pectoris: Secondary | ICD-10-CM

## 2011-02-22 LAB — LIPID PANEL
LDL Cholesterol: 107 mg/dL — ABNORMAL HIGH (ref 0–99)
Total CHOL/HDL Ratio: 4.4 Ratio
VLDL: 19 mg/dL (ref 0–40)

## 2011-02-22 MED ORDER — METFORMIN HCL 500 MG PO TABS: 1000.0000 mg | ORAL_TABLET | Freq: Two times a day (BID) | ORAL | Status: DC

## 2011-02-22 MED ORDER — HYDROCHLOROTHIAZIDE 12.5 MG PO CAPS: 25.0000 mg | ORAL_CAPSULE | Freq: Every day | ORAL | Status: DC

## 2011-02-22 NOTE — Telephone Encounter (Signed)
Per [name] - pt had lab drawn today however he was not fasting.  He had eaten approximately 4 hours prior.  Will forward information to Dr Percival Spanish for his knowledge.

## 2011-02-22 NOTE — Telephone Encounter (Signed)
It was a lab test run and it was done wrong and they need to talk to you about this

## 2011-02-22 NOTE — Progress Notes (Signed)
Diabetes Self-Management Training (DSMT)  Initial Visit  02/22/2011 [name], identified by name and date of birth, is a 50 y.o. male with Type 2 Diabetes. Year of diabetes diagnosis: 2012 Other persons present: spouse/SO  ASSESSMENT Patient concerns are Monitoring and Problem solving.  Height 6' (1.829 m), weight 218 lb 6.4 oz (99.066 kg). Body mass index is 29.62 kg/(m^2). Lab Results  Component Value Date   LDLCALC 156* 11/29/2010   Lab Results  Component Value Date   HGBA1C 8.8 02/22/2011   Medication Nutrition Monitor: one touch Ultra min, with only 25 strips and no insurance to offset cost of additional strips- recommended patient use [place] meter and strips that cost $6.00 Patients belief/attitude about diabetes: Diabetes can be controlled. Self foot exams daily: Not addressed today due to various other issues Diabetes Complications: Retinopathy Patient with retinal detachment recently Labs reviewed.  DIABETES BUNDLE: A1C in past 6 months? Yes.  Less than 7%? No LDL in past year? Yes.  Less than 100 mg/dL? No Microalbumin ratio in past year? No. Patient taking ACE or ARB? Yes. Blood pressure less than 130/80? Yes. Foot exam in last year? Yes. Eye exam in past year? Yes. Tobacco use? No. Pneumovax? Yes Flu vaccine? No Asprin? Yes  Family history of diabetes:not sure Support systems: significant other Special needs: Large print, Simplified materials, Verbal instruction Prior DM Education: Yes   Medications See Medications list.  Is interested in learning more    Exercise Plan Doing ADLs for 60 minutesa day.   Self-Monitoring Frequency of testing: 3-5 times/week Breakfast: 150-200 Lunch: 200-300  Hyperglycemia: Yes Weekly Hypoglycemia: No   Meal Planning Some knowledge- needs review using simple meal planning methods such as plate method to encourage weight loss and healthy fats  Assessment comments: Dr. Donna Soto present at  visit today. He assessed patient and increased his metformin dose to improve blood sugars. patient seen acutely today per his request. His significant other is with him, checks his blood sugar and takes care of his diabetes for the most part. Patient seeing retinal specialist and cardiologist, Recommend monthly to bimonthly brief diabetes self management visits as patient doe not seem to be able to retain too much at one time. Wave sense presto meter and strips called in to [facility].    INDIVIDUAL DIABETES EDUCATION PLAN:  Diabetes disease state Nutrition management Medication Monitoring _______________________________________________________________________  Intervention TOPICS COVERED TODAY:  Diabetes disease state Definition of diabetes, type 1 and 2, and the diagnosis of diabetes. Nutrition management  Role of diet in the treatment of diabetes and the relationship between the three main macronutritents and blood glucose control. Reviewed blood glucose goals for pre and post meals and how to evaluate the patients' food intake on their blood glucose level. Medication  Reviewed patients medication for diabetes, action, purpose, timing of dose and side effects. Monitoring  Taught/evaluated SMBG with one touch ultra mini meter. Purpose and frequency of SMBG. Identified appropriate SMBG and A1C goals.  PATIENTS GOALS/PLAN (copy and paste in patient instructions so patient receives a copy): 1.  Learning Objective:       To know how to eat well balanced meals, fit in favorite foods and still maintain diabetes control 2.  Behavioral Objective:         Nutrition: To improve blood glucose control I will follow meal plan of half a plate of veggie, one fourth carbs, one fourth baked broiled or grilled protein foods Half of the time 50% Medications:  To improve blood glucose levels, I will take my medication as prescribed Always 100% Monitoring: To identify blood glucose trends, I will  test my blood glucose 2 times until follow up day Never 0%  Personalized Follow-Up Plan for Ongoing Self Management Support:  [place], friends and CDE visits ______________________________________________________________________   Outcomes Expected outcomes: Demonstrated interest in learning.Expect positive changes in lifestyle.  Self-care Barriers: Impaired vision, Debilitated state due to current medical condition, Unsteady gait/risk for falls, Lack of material resources  Education material provided: meter download that showed 1/8 blood sugars in past month in target  Patient to contact team via Phone if problems or questions.  Time in: 1330     Time out: 1430  Future DSMT - 4-6 wks   Soto, Kevin Penny

## 2011-02-22 NOTE — Telephone Encounter (Signed)
If the lipids were sent I will review.  If they are not at target he will need a fasting lipid.

## 2011-02-22 NOTE — Progress Notes (Signed)
Addended by: Truddie Crumble on: 02/22/2011 02:15 PM   Modules accepted: Orders

## 2011-02-22 NOTE — Assessment & Plan Note (Addendum)
Patient responded to the metformin treatment. But his glucose is still high and A1c today is 8.8. Will increased the dose of metformin to 1000 mg Bid. Will monitor his response.

## 2011-02-22 NOTE — Patient Instructions (Addendum)
You are doing a great job to lower your 3 month average blood sugar to 8.8% or 206 mg/dl  Pick up new meter Flavia [employer])  and 100 strips at [facility],   Please schedule an appointment in [place] with doctor and Diabetes Educator - can be on same day.  Increase the metformin to 2- 500 mg pills twice daily until you run out, then start 1000 mg metformin twice daily per Dr. Thermon Leyland instructions.

## 2011-02-22 NOTE — Telephone Encounter (Signed)
HCTZ rx refill faxed to [facility].

## 2011-02-23 ENCOUNTER — Encounter (HOSPITAL_COMMUNITY): Payer: Self-pay

## 2011-02-23 LAB — BASIC METABOLIC PANEL WITH GFR
BUN: 20 mg/dL (ref 6–23)
GFR, Est African American: >60 mL/min (ref >60)
GFR, Est Non African American: >60 mL/min (ref >60)
Potassium: 4.6 mEq/L (ref 3.5–5.3)
Sodium: 139 mEq/L (ref 135–145)

## 2011-02-26 ENCOUNTER — Encounter (HOSPITAL_COMMUNITY): Payer: Self-pay

## 2011-02-27 ENCOUNTER — Emergency Department (HOSPITAL_COMMUNITY): Payer: Self-pay

## 2011-02-27 ENCOUNTER — Observation Stay (HOSPITAL_COMMUNITY): Payer: Self-pay

## 2011-02-27 ENCOUNTER — Encounter (HOSPITAL_COMMUNITY): Payer: Self-pay | Admitting: Radiology

## 2011-02-27 ENCOUNTER — Encounter: Payer: Self-pay | Admitting: Internal Medicine

## 2011-02-27 ENCOUNTER — Ambulatory Visit (INDEPENDENT_AMBULATORY_CARE_PROVIDER_SITE_OTHER): Payer: Self-pay | Admitting: Internal Medicine

## 2011-02-27 ENCOUNTER — Inpatient Hospital Stay (HOSPITAL_COMMUNITY)
Admission: EM | Admit: 2011-02-27 | Discharge: 2011-03-01 | DRG: 123 | Disposition: A | Discharge status: Home Health | Payer: Self-pay | Source: Ambulatory Visit | Attending: Infectious Diseases | Admitting: Infectious Diseases

## 2011-02-27 VITALS — BP 128/80 | HR 82 | Temp 96.5°F | Ht 72.0 in | Wt 215.1 lb

## 2011-02-27 DIAGNOSIS — H53139 Sudden visual loss, unspecified eye: Secondary | ICD-10-CM

## 2011-02-27 DIAGNOSIS — I639 Cerebral infarction, unspecified: Secondary | ICD-10-CM

## 2011-02-27 DIAGNOSIS — R269 Unspecified abnormalities of gait and mobility: Secondary | ICD-10-CM

## 2011-02-27 DIAGNOSIS — Z8673 Personal history of transient ischemic attack (TIA), and cerebral infarction without residual deficits: Secondary | ICD-10-CM

## 2011-02-27 DIAGNOSIS — E785 Hyperlipidemia, unspecified: Secondary | ICD-10-CM

## 2011-02-27 DIAGNOSIS — Z79899 Other long term (current) drug therapy: Secondary | ICD-10-CM

## 2011-02-27 DIAGNOSIS — I10 Essential (primary) hypertension: Secondary | ICD-10-CM

## 2011-02-27 DIAGNOSIS — E119 Type 2 diabetes mellitus without complications: Secondary | ICD-10-CM

## 2011-02-27 DIAGNOSIS — I252 Old myocardial infarction: Secondary | ICD-10-CM

## 2011-02-27 DIAGNOSIS — H3400 Transient retinal artery occlusion, unspecified eye: Principal | ICD-10-CM | POA: Diagnosis present

## 2011-02-27 DIAGNOSIS — Z7902 Long term (current) use of antithrombotics/antiplatelets: Secondary | ICD-10-CM

## 2011-02-27 DIAGNOSIS — Z7982 Long term (current) use of aspirin: Secondary | ICD-10-CM

## 2011-02-27 DIAGNOSIS — H3560 Retinal hemorrhage, unspecified eye: Secondary | ICD-10-CM

## 2011-02-27 DIAGNOSIS — R2681 Unsteadiness on feet: Secondary | ICD-10-CM | POA: Insufficient documentation

## 2011-02-27 DIAGNOSIS — F1010 Alcohol abuse, uncomplicated: Secondary | ICD-10-CM | POA: Diagnosis present

## 2011-02-27 DIAGNOSIS — G459 Transient cerebral ischemic attack, unspecified: Secondary | ICD-10-CM

## 2011-02-27 DIAGNOSIS — I6350 Cerebral infarction due to unspecified occlusion or stenosis of unspecified cerebral artery: Secondary | ICD-10-CM

## 2011-02-27 DIAGNOSIS — I2510 Atherosclerotic heart disease of native coronary artery without angina pectoris: Secondary | ICD-10-CM | POA: Diagnosis present

## 2011-02-27 DIAGNOSIS — I2589 Other forms of chronic ischemic heart disease: Secondary | ICD-10-CM | POA: Diagnosis present

## 2011-02-27 DIAGNOSIS — Z299 Encounter for prophylactic measures, unspecified: Secondary | ICD-10-CM

## 2011-02-27 DIAGNOSIS — D649 Anemia, unspecified: Secondary | ICD-10-CM | POA: Diagnosis present

## 2011-02-27 DIAGNOSIS — Z9861 Coronary angioplasty status: Secondary | ICD-10-CM

## 2011-02-27 DIAGNOSIS — Z23 Encounter for immunization: Secondary | ICD-10-CM

## 2011-02-27 HISTORY — DX: Sudden visual loss, unspecified eye: H53.139

## 2011-02-27 HISTORY — DX: Unsteadiness on feet: R26.81

## 2011-02-27 LAB — COMPREHENSIVE METABOLIC PANEL
BUN: 24 mg/dL — ABNORMAL HIGH (ref 6–23)
Calcium: 9.9 mg/dL (ref 8.4–10.5)
GFR calc Af Amer: 87 mL/min — ABNORMAL LOW (ref >90)
Glucose, Bld: 136 mg/dL — ABNORMAL HIGH (ref 70–99)
Sodium: 136 mEq/L (ref 135–145)
Total Protein: 7.9 g/dL (ref 6.0–8.3)

## 2011-02-27 LAB — CBC
MCV: 81 fL (ref 78.0–100.0)
Platelets: 331 10*3/uL (ref 150–400)
RDW: 13.9 % (ref 11.5–15.5)
WBC: 11.5 10*3/uL — ABNORMAL HIGH (ref 4.0–10.5)

## 2011-02-27 LAB — BASIC METABOLIC PANEL
Calcium: 10 mg/dL (ref 8.4–10.5)
Chloride: 102 mEq/L (ref 96–112)
Creatinine, Ser: 1.26 mg/dL (ref 0.50–1.35)
GFR calc Af Amer: 56 mL/min — ABNORMAL LOW (ref >90)
Sodium: 137 mEq/L (ref 135–145)

## 2011-02-27 LAB — URINALYSIS, ROUTINE W REFLEX MICROSCOPIC
Bilirubin Urine: NEGATIVE
Hgb urine dipstick: NEGATIVE
Protein, ur: 30 mg/dL — AB
Urobilinogen, UA: 0.2 mg/dL (ref 0.0–1.0)

## 2011-02-27 LAB — GLUCOSE, CAPILLARY: Glucose-Capillary: 159 mg/dL — ABNORMAL HIGH (ref 70–99)

## 2011-02-27 LAB — HEMOGLOBIN A1C: Hgb A1c MFr Bld: 8.7 % — ABNORMAL HIGH (ref <5.7)

## 2011-02-27 LAB — CK TOTAL AND CKMB (NOT AT ARMC)
CK, MB: 3.2 ng/mL (ref 0.3–4.0)
Relative Index: 2.9 — ABNORMAL HIGH (ref 0.0–2.5)
Total CK: 111 U/L (ref 7–232)

## 2011-02-27 LAB — PROTIME-INR: Prothrombin Time: 14.9 seconds (ref 11.6–15.2)

## 2011-02-27 LAB — TROPONIN I: Troponin I: <0.3 ng/mL (ref <0.30)

## 2011-02-27 LAB — ETHANOL: Alcohol, Ethyl (B): <11 mg/dL (ref 0–11)

## 2011-02-27 LAB — RAPID URINE DRUG SCREEN, HOSP PERFORMED: Opiates: NOT DETECTED

## 2011-02-27 NOTE — Assessment & Plan Note (Signed)
Possible subacute stroke in the setting of HTN, HLD , DM and previous stroke. Other DD include just declining of previouse CVA since patient was noted to have events in the cerebellum area per MRI in 11/2010. After discussion with Dr Marinda Elk patient was referred to ED for emergent evaluation for stroke with CT and MRI and Neurology consult. Patient would also benefit from PT.

## 2011-02-27 NOTE — Progress Notes (Signed)
Addended by: Julieanne Manson A on: 02/27/2011 01:58 PM   Modules accepted: Orders

## 2011-02-27 NOTE — Assessment & Plan Note (Addendum)
Consider to increase Pravastatin to 80 mg in the setting CVA, CAD, DM, HTN with LDL of 107.   Lab Results  Component Value Date   CHOL 163 02/22/2011   CHOL 227* 11/29/2010   Lab Results  Component Value Date   HDL 37* 02/22/2011   HDL 50 11/29/2010   Lab Results  Component Value Date   LDLCALC 107* 02/22/2011   LDLCALC 156* 11/29/2010   Lab Results  Component Value Date   TRIG 95 02/22/2011   TRIG 104 11/29/2010   Lab Results  Component Value Date   CHOLHDL 4.4 02/22/2011   CHOLHDL 4.5 11/29/2010   No results found for this basename: LDLDIRECT

## 2011-02-27 NOTE — Progress Notes (Signed)
Subjective:   Patient ID: Kevin Soto male   DOB: 22-Sep-1960 50 y.o.   MRN: [id number]  HPI: [name] is a 50 y.o. male with PMH significant as outlined below who presented to the clinic for acute vision loss and imbalance in gait. 1. Vision loss: The patient noted he came in this morning and was not able to see anything. He was not able to fill out his form. He waited until sister came and asked her to fill out the form since she was not able to see anything. They did not inform anybody about the vision changes. He noted that his eyes became very watery, and vision completely blurry and everything was bizzarr. No flushes , pain or burning sensation noted at that time. The patient was evaluated 30 min after the event by me and he noted that he was still seeing everything blurry. He was not able to read anything to me . He and his sister reports that is an acute change since yesterday. After 20 min the patient was evaluated by me and Dr Marinda Elk and then he noted that his vision is slowly improving and he was able to read to me. Of note: The patient was last seen by Dr Baird Cancer ([place]) on Friday the 5th who stopped all his eye drop. Patient had undergone laser due to membranous hemorrhages on the left eye and Dr Baird Cancer reports about swelling of Retina in the right eye likely due to DM. I informed Dr Baird Cancer about the finding and recommended outpatient follow up as soon as possible. ( phone number [phone])  2. Imbalance in gait: Patient reports worsening imbalance gait since 2 weeks but an acute change since yesterday per sister. He was trying to get up and almost fell . Somebody had to hold on to him. He also started to hold on to everything since he had the feeling that his left leg was giving out on him. He further noted that his left arm feels very stiff , similar to the left leg. Again which is new for him. One month ago he was running per sister.  Denies any headache, chest pain,  SOB, dizziness  or injury.  The sister noted since being discharged in July the patient has changed. His mental status. He is not sharp anymore. He needs more time to get the words out and is in general very slow. He had some trouble walking in July but never this bad.  Of note: Patient was admitted in July for blurry vision on left eye and Hypertensive emergency. Patient had never seen a physician for 10 years. 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      Past Medical History  Diagnosis Date  . Diabetes mellitus   . Hypertension   . Macular hemorrhage   . Dyslipidemia   . Ischemic cardiomyopathy     echo 7/12: EF 40-45%, septal, apical, inf basal HK, mod LVH, mild MR, mild LAE  . Anemia   . CAD (coronary artery disease)     cath 8/12:  LM ok, mLAD occluded with trivial collats from right AM and dRCA, mDx (small) 80%, RI 25%, tiny branch of OM 90%, mAM 30-40%, RCA sub-totally occluded, then 80%, EF 40%, inf  HK.  He underwent PCI with [name] with placement of a Promus DES to the mRCA x 2  . History of stroke     evidence of CVA on MRI in past  . Anemia   . History of alcohol abuse    Current Outpatient Prescriptions  Medication Sig Dispense Refill  . aspirin EC 81 MG tablet Take 1 tablet (81 mg total) by mouth daily.      . bimatoprost (LUMIGAN) 0.01 % SOLN 1 drop at bedtime.        . carvedilol (COREG) 12.5 MG tablet Take 2 tablets (25 mg total) by mouth 2 (two) times daily.  60 tablet  6  . dorzolamide-timolol (COSOPT) 22.3-6.8 MG/ML ophthalmic solution Place 1 drop into the left eye 2 (two) times daily.        . hydrochlorothiazide (MICROZIDE) 12.5 MG capsule Take 2 capsules (25 mg  total) by mouth daily.  60 capsule  6  . lisinopril (PRINIVIL,ZESTRIL) 20 MG tablet Take 1 tablet (20 mg total) by mouth 2 (two) times daily.  60 tablet  6  . metFORMIN (GLUCOPHAGE) 500 MG tablet Take 2 tablets (1,000 mg total) by mouth 2 (two) times daily with a meal.  60 tablet  6  . oxyCODONE-acetaminophen (PERCOCET) 5-325 MG per tablet Take 1 tablet by mouth every 6 (six) hours as needed.        . prasugrel (EFFIENT) 10 MG TABS TODAY WHEN YOU GET HOME YOU ARE TO TAKE 6 TABLETS TO = 60 MG TODAY; AFTER TODAY YOU WILL TAKE ONLY 1 TABLET DAILY.      . pravastatin (PRAVACHOL) 40 MG tablet Take 40 mg by mouth daily.        Marland Kitchen spironolactone (ALDACTONE) 25 MG tablet Take 1 tablet (25 mg total) by mouth daily.  30 tablet  6   Family History  Problem Relation Age of Onset  . Heart attack Mother     Died 61  . Heart failure Mother   . Diabetes Mother   . Heart attack Father   . Heart failure Father   . Diabetes Father   . Heart disease Sister     s/p CABG x3 in her mid 61's  . Coronary artery disease Other     Premature   History   Social History  . Marital Status: Single    Spouse Name: N/A    Number of Children: 0  . Years of Education: N/A   Occupational History  . Stage hand    Social History Main Topics  . Smoking status: Never Smoker   . Smokeless tobacco: Never Used  . Alcohol Use: 15.6 oz/week    6 Cans of beer, 20 Shots of liquor per week     6-pack of beer a week/ half a fifth of liquor each week  . Drug Use: No  . Sexually Active: Yes   Other Topics Concern  . None   Social History Narrative   Lives in [place] with his cousinNo childrenExercises 3 times a week   Review of Systems: Constitutional: Denies fever, chills, diaphoresis, appetite change and fatigue.  HEENT: Denies eye pain,  hearing loss, ear pain, congestion, sore throat, rhinorrhea, sneezing, mouth sores, trouble swallowing, neck pain, neck stiffness and tinnitus.   Respiratory: Denies SOB, DOE,  cough, chest tightness,  and wheezing.   Cardiovascular: Denies chest pain, palpitations and leg swelling.  Gastrointestinal: Denies nausea, vomiting, abdominal pain, diarrhea, constipation, blood in stool and abdominal distention.  Genitourinary: Denies dysuria, urgency, frequency, hematuria, flank pain and difficulty urinating.  Musculoskeletal: Denies myalgias, back pain, joint swelling, arthralgias  Skin: Denies pallor, rash and wound.  Neurological: Denies dizziness, seizures, syncope, light-headednessand headaches.  Hematological: Denies adenopathy. Easy bruising, personal or family bleeding history    Objective:  Physical Exam: Filed Vitals:   02/27/11 1053  BP: 128/80  Pulse: 82  Temp: 96.5 F (35.8 C)  TempSrc: Oral  Height: 6' (1.829 m)  Weight: 215 lb 1.6 oz (97.569 kg)   Constitutional: Vital signs reviewed.  Patient is a well-developed and well-nourished  in no acute distress and cooperative with exam. Alert and oriented x3.  Head: Normocephalic and atraumatic Ear: TM normal bilaterally Mouth: no erythema or exudates, MMM Eyes: PERRL, EOMI, conjunctivae injected in the left eye, No scleral icterus. Not able to see the fundus.  Neck: Supple, Trachea midline normal ROM, No JVD, mass, thyromegaly, or carotid bruit present.  Cardiovascular: RRR, S1 normal, S2 normal, no MRG, pulses symmetric and intact bilaterally Pulmonary/Chest: CTAB, no wheezes, rales, or rhonchi Abdominal: Soft. Non-tender, non-distended, bowel sounds are normal, no masses, organomegaly, or guarding present.  GU: no CVA tenderness Musculoskeletal: No joint deformities, erythema, or stiffness, ROM full and no nontender Hematology: no cervical, inginal, or axillary adenopathy.  Neurological: A&O x3, Strenght is normal and symmetric bilaterally, cranial nerve II-XII are grossly intact except not able to frown the forehead on the left, no asymmetry noted , sensory intact to light touch bilaterally. Romberg  negative. Finger to nose test normal. Gait significantly instable. Not able to walk on straight line.  Skin: Warm, dry and intact. No rash, cyanosis, or clubbing.  Psychiatric: Normal mood and affect. speech and behavior is normal.

## 2011-02-27 NOTE — Assessment & Plan Note (Signed)
Patient had sudden vision with improved within an hour. DD include acute  Hemorrhage or due to CVA since patient had small infarct posterior right corona radiata.Discussed with Dr Baird Cancer who recommended outpatient evaluation as soon as possible.

## 2011-02-27 NOTE — Progress Notes (Deleted)
  Subjective:    Patient ID: Kevin Soto, male    DOB: 02/13/1961, 50 y.o.   MRN: [id number]  HPI    Review of Systems     Objective:   Physical Exam        Assessment & Plan:

## 2011-02-27 NOTE — Assessment & Plan Note (Addendum)
Blood pressure well controlled on current regimen.  BP Readings from Last 3 Encounters:  02/27/11 128/80  02/16/11 112/78  01/26/11 126/76

## 2011-02-27 NOTE — Assessment & Plan Note (Signed)
No changes in management today since patient will be admitted for evaluation of gait instability and vision changes.

## 2011-02-27 NOTE — H&P (Signed)
Hospital Admission Note Date: 02/27/2011  Patient name: Kevin Soto Medical record number: [id number] Date of birth: 1961-03-23 Age: 50 y.o. Gender: male PCP: Ivor Costa, MD, MD  Medical Service: Orene Desanctis  Attending physician: Dr. Orene Desanctis Resident [phone]): Dr. Leonia Reeves   Pager: [phone] Acting Intern: Jimmye Norman   Pager: [phone]  Chief Complaint: L Leg weakness and L eye blindness  History of Present Illness: Kevin Soto is a 50 yo man with DM, HTN, hx of macular hemorrhage, CAD, hx of prior stroke on MRI, dyslipidemia, and ischemic cardiomyopathy presents with L leg and arm weakness for the past two weeks and sudden onset today of L eye blindness, which resolved but vision remains blurry. Kevin Soto reports thatwhen he walks, his leg frequently "gives out" which has resulted in him having trouble getting around. He describes it as a persistent, everyday problem everytime he tries to walk. He states he has not fallen because he catches himself on the wall or other nearby object. He also reports over the last two weeks of noticing slurring of his speech. While in the clinic this morning, Kevin Soto reported a sudden inability to see out of his left eye, which became very watery and gradually his vision improved. Although he reports this is the first time that he has experienced these visual symptoms, review of his medical records reveals that he was admitted to [facility] in July for sudden vision loss in his left eye and was discharged with a dx of hypertensive retinopathy with intraretinal and subretinal macular hemorrhage of the left eye. Kevin Soto states that he hadn't experienced the leg/arm weakness symptoms until two weeks ago. He reports that in the first week of September 2012 he had left eye vitriectomy and PCI with placement of two drug eluting stents in two separate procedures.  Kevin Soto denies fevers, chills, new problems with bladder or bowels, chest pain, head ache, falls,joint pain,  nausea, vomiting, or other aches/pains.  Meds: Current Outpatient Prescriptions  Medication Sig Dispense Refill  . aspirin EC 81 MG tablet Take 1 tablet (81 mg total) by mouth daily.      . carvedilol (COREG) 12.5 MG tablet Take 2 tablets (25 mg total) by mouth 2 (two) times daily.  60 tablet  6  . hydrochlorothiazide (MICROZIDE) 12.5 MG capsule Take 2 capsules (25 mg total) by mouth daily.  60 capsule  6  . lisinopril (PRINIVIL,ZESTRIL) 20 MG tablet Take 1 tablet (20 mg total) by mouth 2 (two) times daily.  60 tablet  6  . metFORMIN (GLUCOPHAGE) 500 MG tablet Take 2 tablets (1,000 mg total) by mouth 2 (two) times daily with a meal.  60 tablet  6  . oxyCODONE-acetaminophen (PERCOCET) 5-325 MG per tablet Take 1 tablet by mouth every 6 (six) hours as needed.        . prasugrel (EFFIENT) 10 MG TABS TODAY WHEN YOU GET HOME YOU ARE TO TAKE 6 TABLETS TO = 60 MG TODAY; AFTER TODAY YOU WILL TAKE ONLY 1 TABLET DAILY.      . pravastatin (PRAVACHOL) 40 MG tablet Take 40 mg by mouth daily.        Marland Kitchen spironolactone (ALDACTONE) 25 MG tablet Take 1 tablet (25 mg total) by mouth daily.  30 tablet  6  . Cosopt Opth     . Lumigan Opth       Allergies: Clonidine derivatives  Past Medical History  Diagnosis Date  . Diabetes mellitus   . Hypertension   .  Macular hemorrhage   . Dyslipidemia   . Ischemic cardiomyopathy     echo 7/12: EF 40-45%, septal, apical, inf basal HK, mod LVH, mild Kevin, mild LAE  . Anemia   . CAD (coronary artery disease)     cath 8/12:  LM ok, mLAD occluded with trivial collats from right AM and dRCA, mDx (small) 80%, RI 25%, tiny branch of OM 90%, mAM 30-40%, RCA sub-totally occluded, then 80%, EF 40%, inf HK.  He underwent PCI with [name] with placement of a Promus DES to the mRCA x 2  . History of stroke     evidence of CVA on MRI in past  . History of alcohol abuse   . CVA (cerebral vascular accident) 11/28/2010    Family History  Problem Relation Age of Onset  . Heart  attack Mother     Died 61  . Heart failure Mother   . Diabetes Mother   . Heart attack Father   . Heart failure Father   . Diabetes Father   . Heart disease Sister     s/p CABG x3 in her mid 5's  . Coronary artery disease Other     Premature    Social Hx: Kevin Soto reports that he now lives with his sister in [place]. He has no children. 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 He reports he has never smoked. He states that he used to drink about 6 beers per week and a fifth of liquor per week, but hasn't had anything to drink since June.   Review of Systems: As per HPI  Physical Exam: Vitals: 165/88  P 70  RR 16  T 98.5  O2sat 98% on RA  General Appearance:    Alert, cooperative, no distress, appears stated age  Head:    Normocephalic, without obvious abnormality, atraumatic  Eyes:    PERRL, sclera clear, EOM's intact. Vision in left eye very poor; R eye vision appears intact.  Nose:   Nares normal, septum midline, mucosa normal, no drainage  Throat:   Moist mucus membranes  Neck:  Supple, symmetrical, trachea midline, no carotid   bruit heard  Lungs:     Clear to auscultation bilaterally, respirations unlabored  Heart:    Regular rate and rhythm, S1 and S2 normal, no murmur, rub   or gallop  Abdomen:     Soft, non-tender, bowel sounds active all four quadrants,    no masses, no organomegaly  Extremities:   Extremities normal, atraumatic, no cyanosis or edema  Skin:   Skin color, texture, turgor normal, no rashes or lesions  Neurologic:   Full 5/5 strength in upper and lower extremities. Sensation to light touch intact bilaterally. Reflexes 2+ bilaterally. Cranial nerve exam appears intact aside from L eyelid droops slightly but appears to have full strength when asked to not let examiner open eyes. Romberg negative. Gait very unstable and hesitant.  Finger-to-nose testing impaired, but may be related to decreased  depth perception.      Lab results: CBC without Diff: WBC                                      11.5       h      4.0-10.5         K/uL  RBC  3.63       l      4.22-5.81        MIL/uL  Hemoglobin (HGB)                         10.0       l      13.0-17.0        g/dL  Hematocrit (HCT)                         29.4       l      39.0-52.0        %  MCV                                      81.0              78.0-100.0       fL  MCH -                                    27.5              26.0-34.0        pg  MCHC                                     34.0              30.0-36.0        g/dL  RDW                                      13.9              11.5-15.5        %  Platelet Count (PLT)                     331               150-400          K/uL  BMET: Result Name                              Result     Abnl   Normal Range     Units      Perf. Loc.  Sodium (NA)                              137               135-145          mEq/L  Potassium (K)                            4.0               3.5-5.1          mEq/L  Chloride  102               96-112           mEq/L  CO2                                      25                19-32            mEq/L  Glucose                                  152        h      70-99            mg/dL  BUN                                      25         h      6-23             mg/dL  Creatinine                               1.26              0.50-1.35        mg/dL  Calcium                                  10.0              8.4-10.5         mg/dL  Imaging results:  Ct Head Wo Contrast 02/27/2011  *RADIOLOGY REPORT*  CT HEAD WITHOUT CONTRAST  Technique:  Contiguous axial images were obtained from the base of the skull through the vertex without contrast.  Comparison: MRI 11/28/2010  Findings: Ventricle size is normal.  Chronic infarcts in the cerebellum bilaterally.  Chronic ischemia in the cerebral white  matter on the right.  Chronic infarct in the right thalamus.  Negative for acute infarct. Negative for intracranial hemorrhage or mass lesion.  Increased density within the vitreous of the left eye may represent hemorrhage or injected material.  IMPRESSION: Chronic ischemic change.  No acute abnormality.   Other results: EKG -normal sinus rhythm with some T wave inversion in the precordial leads but consistent with prior EKG from 12/2010.  Assessment & Plan by Problem: Active Problems: 1. Acute vision loss in L eye: Amaurosis fugax vs hemorrhage. Etiology includes ischemia (carotid artery disease vs TIA) vs retinal vein occlusion vs papilledema.  Pt has history of hypertensive retinopathy with macular hemorrhage and had a similar kind of admission in 07/12. . Have admitted to hospital to rule out ischemic causes. Will order PT/INR, PTT, UA, cardiac enzymes, urine drug screen, ETOH level, 2D echo, carotid dopplers, MRI/ MRA of brain without contrast 2. Weakness in L arm and leg: Stated 2 weeks ago. Ddx includes CVA vs muscular/joint injuries. CT shows chronic ischemia on the cerebral white matter on R side of head which could be consistent with weakness. Physician exam and history found no joint  problems or weakness. Will pursue stroke/TIA workup as noted above for problem acute L eye vision loss. 3. DM - SSI while in hospital. Ordered HbA1c 4. CAD: Cont ASA 325mg ,  5. HTN: Holding HTN meds for now in context of potential TIA/stroke. 6. Anemia -  discovered in July 2012 hospitalization.  H and H stable. Continue to monitor. 8. Dyslipidemia: pravastatin while hospitalized. Have ordered fasting lipid panel for AM. 9. Hx of alcohol abuse: will initiate CIWA protocol, ordered ETOH level. 10. DVT ppx: lovenox 11. Disposition: will complete workup before return to home and eventual follow-up with PCP and outpt ophthomologist, who is aware of hospitalization.  R2/3______________________________       R1________________________________  ATTENDING: I performed and/or observed a history and physical examination of the patient.  I discussed the case with the residents as noted and reviewed the residents' notes.  I agree with the findings and plan--please refer to the attending physician note for more details.  Signature________________________________  Printed Name_____________________________

## 2011-02-28 ENCOUNTER — Observation Stay (HOSPITAL_COMMUNITY): Payer: Self-pay

## 2011-02-28 ENCOUNTER — Encounter (HOSPITAL_COMMUNITY): Payer: Self-pay

## 2011-02-28 DIAGNOSIS — I369 Nonrheumatic tricuspid valve disorder, unspecified: Secondary | ICD-10-CM

## 2011-02-28 DIAGNOSIS — G459 Transient cerebral ischemic attack, unspecified: Secondary | ICD-10-CM

## 2011-02-28 LAB — GLUCOSE, CAPILLARY
Glucose-Capillary: 176 mg/dL — ABNORMAL HIGH (ref 70–99)
Glucose-Capillary: 121 mg/dL — ABNORMAL HIGH (ref 70–99)

## 2011-02-28 LAB — LIPID PANEL
LDL Cholesterol: 78 mg/dL (ref 0–99)
VLDL: 28 mg/dL (ref 0–40)

## 2011-02-28 LAB — SEDIMENTATION RATE: Sed Rate: 75 mm/hr — ABNORMAL HIGH (ref 0–16)

## 2011-03-01 LAB — CBC
Hemoglobin: 9.1 g/dL — ABNORMAL LOW (ref 13.0–17.0)
MCH: 27.1 pg (ref 26.0–34.0)
MCHC: 33.5 g/dL (ref 30.0–36.0)
MCV: 81 fL (ref 78.0–100.0)
Platelets: 299 10*3/uL (ref 150–400)

## 2011-03-01 LAB — GLUCOSE, CAPILLARY: Glucose-Capillary: 117 mg/dL — ABNORMAL HIGH (ref 70–99)

## 2011-03-01 NOTE — Discharge Summary (Signed)
NAMEGORDON, Kevin Soto NO.:  [id number]  MEDICAL RECORD NO.:  [id number]  LOCATION:  6529                         FACILITY:  [facility]  PHYSICIAN:  Minus Breeding, MD, FACCDATE OF BIRTH:  1960-07-11  DATE OF ADMISSION:  01/11/2011 DATE OF DISCHARGE:  01/12/2011                              DISCHARGE SUMMARY   PRIMARY CARDIOLOGIST:  Minus Breeding, MD, [facility]  PRIMARY CARE PROVIDER:  Dr. Ivor Costa.  DISCHARGE DIAGNOSES: 1. Coronary artery disease status post drug-eluting stent placement to     the right coronary artery x2 this admission. 2. Hypertension. 3. Hyperlipidemia. 4. Ischemic cardiomyopathy with an ejection fraction of 40%, inferior     hypokinesis by ventriculography this admission. 5. Poorly-controlled type 2 diabetes mellitus. 6. Hypertension with history of hypertensive urgency. 7. Type 2 diabetes mellitus. 8. Macular hemorrhage. 9. Hyperlipidemia. 10.Anemia. 11.EtOH abuse.  ALLERGIES:  CLONIDINE causes dizziness.  PROCEDURES: 1. Left heart cardiac catheterization performed on January 11, 2011,     revealing left main normal.  LAD 100% mid.  A small diagonal branch     had diffuse 80% stenosis.  Ramus intermedius had 25% mid stenosis.     Circumflex was normal.  RCA was dominant with 90% subtotal     occlusion in the midsection of the artery.  EF was 40% with     inferior hypokinesis. 2. Successful PCI and stenting of the mid-right coronary artery with     placement of a 2.5 x 38-mm PROMUS Element Plus drug-eluting stent     as well as a 2.75 x 12-mm PROMUS Element Plus drug-eluting stent.  HISTORY OF PRESENT ILLNESS:  A 50 year old male admitted to [facility] in July with hypertensive emergency with blurred vision and finding of left macular hemorrhage.  He had a 2-D echocardiogram during that admission showing an EF of 40% to 45%.  Cardiology was consulted, and the patient subsequently underwent Myoview stress testing showed an EF of 28%  with diffuse hypokinesis and extensive scar on the anterior, anterolateral, anteroseptal, inferoseptal, inferior, inferolateral, and apical distributions.  There was minimal ischemia noted in the anterior base and distal anterolateral region.  The patient was not having any chest pain and subsequently followed up in our office on January 08, 2011, and decision was made to pursue diagnostic catheterization.  HOSPITAL COURSE:  The patient presented to the [facility] on January 11, 2011, where he underwent diagnostic catheterization revealing total occlusion of the LAD and 90% subtotal occlusion of the right coronary artery and otherwise nonobstructive disease.  The right coronary artery was successfully treated with 2 PROMUS Element Plus drug- eluting stents as outlined above.  The patient tolerated the procedure well and postprocedure has been ambulating without Cardiac Rehab.  He will be discharged home today in good condition.  DISCHARGE LABS:  Hemoglobin 10.2, hematocrit 29.9, WBC 10.4, platelets 318.  INR 1.2.  Sodium 139, potassium 3.8, chloride 103, CO2 of 29, BUN 18, creatinine 1.10, glucose 189, calcium 9.5.  DISPOSITION:  The patient will be discharged home today in good condition.  FOLLOWUP PLANS AND APPOINTMENTS:  We have arranged followup with Kevin Dopp, PA in our office on January 26, 2011,  at 11:30 a.m.  He will follow up with primary care provider as previously scheduled.  DISCHARGE MEDICATIONS: 1. Aspirin 81 mg daily. 2. Nitroglycerin 0.4 mg sublingual p.r.n. chest pain. 3. Plavix 75 mg daily. 4. Coreg 3.125 mg daily. 5. Lisinopril 20 mg b.i.d. 6. Metformin 500 mg b.i.d. to be resumed on January 13, 2011. 7. Pravastatin 40 mg daily. 8. Spironolactone 25 mg daily.  OUTSTANDING LABS AND STUDIES:  None.  DURATION OF DISCHARGE ENCOUNTER:  45 minutes including physician.     Murray Hodgkins, ANP   ______________________________ Minus Breeding, MD,  [facility]    CB/MEDQ  D:  01/12/2011  T:  01/12/2011  Job:  [id number]  cc:   Ivor Costa, MD  Electronically Signed by Murray Hodgkins ANP on 02/01/2011 03:14:48 PM Electronically Signed by Minus Breeding MD [facility] on 03/01/2011 01:30:10 PM

## 2011-03-01 NOTE — Cardiovascular Report (Signed)
  Kevin Soto, Kevin Soto             ACCOUNT NO.:  [id number]  MEDICAL RECORD NO.:  [id number]  LOCATION:  6529                         FACILITY:  [facility]  PHYSICIAN:  Minus Breeding, MD, FACCDATE OF BIRTH:  Jun 12, 1960  DATE OF PROCEDURE:  01/11/2011 DATE OF DISCHARGE:                           CARDIAC CATHETERIZATION   PRIMARY:  Dr. Blaine Hamper.  PROCEDURE:  Left heart catheterization/coronary arteriography.  INDICATIONS:  The patient with ischemic cardiomyopathy and an EF of 45%. He does have previous infarct demonstrated on nuclear imaging.  PROCEDURE NOTE:  Left heart catheterization was performed via the right radial artery, the vessel was cannulated.  Using an anterior wall approach, a #5-French arterial sheath was inserted via the modified Seldinger technique.  Preformed Judkins and pigtail catheter were utilized.  The patient tolerated the procedure well and left the lab in stable condition.  RESULTS:  Hemodynamics:  LV 164/5, AO 163/93.  Coronaries:  Left main was normal.  LAD was occluded in the mid segment.  There was trivial collateral flow seen from the right acute marginal and distal right coronary artery.  There is a diagonal, which is small with diffuse disease along the 80% stenosis.  There is a long mid 80% stenosis. There is a ramus intermediate which is large branching and got 25% stenosis in the main segment.  There has been a large territory.  The circumflex and AV groove had luminal irregularities.  There is a distal obtuse marginal, which is small.  This vessel had a very tiny branch with 90% stenosis.  The right coronary artery was a dominant vessel. There was a large acute marginal with mid 30-40% stenosis with some septal perforator.  Following this, there was a long subtotal stenosis followed by 80% disease before the PDA and posterolateral.  PDA and posterolateral were somewhat small-to-moderate sized vessels with some diffuse disease.  Left ventriculogram:   The left ventriculogram was obtained in the RAO projection.  The EF was 40% with inferior hypokinesis.  CONCLUSION:  Severe two-vessel coronary artery disease.  Ischemic cardiomyopathy.  PLAN:  After careful consideration, we will attempt revascularization of the right coronary artery.  He will need aggressive risk reduction and medical management.     Minus Breeding, MD, [facility]     JH/MEDQ  D:  01/11/2011  T:  01/11/2011  Job:  [id number]  Electronically Signed by Minus Breeding MD [facility] on 03/01/2011 01:30:07 PM

## 2011-03-02 ENCOUNTER — Encounter (HOSPITAL_COMMUNITY): Payer: Self-pay

## 2011-03-05 ENCOUNTER — Encounter (HOSPITAL_COMMUNITY): Payer: Self-pay

## 2011-03-06 NOTE — Discharge Summary (Signed)
Physician Discharge Summary  Patient ID: Kevin Soto MRN: [id number] DOB/AGE: 25-Aug-1960 50 y.o.  Admit date: 02/27/2011 Discharge date: 03/01/2011  Admission Diagnoses: 1. Acute L eye vision loss  Discharge Diagnoses:  1. Amaurosis fugax.   2. Weakness in left arm and left leg.   3. Diabetes mellitus.   4. Coronary artery disease.   5. Hypertension.   6. Anemia.   7. Dyslipidemia.  Discharged Condition: stable and improved.  Hospital Course:   1. Acute vision loss in left eye, most likely etiology is embolic       ischemic event that resolved.  Mr. Shoults was given CT scan, head       MRI, MRA, none of which showed any acute changes.  Workup in the       hospital was negative for any acute event requiring an       intervention.  Mr. Chaffey was seen by his outpatient       ophthalmologist in hospital who believed that Mr. Dellaquila was       stable and that he should follow up to be seen again in 2 months at       his outpatient appointment.   2. Weakness in arm and leg.  As mentioned above, Mr. Mogg had       multiple imaging studies done which were negative for any acute       changes, however, did show chronic ischemic changes and damage to       his brain.  He was seen by Physical Therapy who recommended either       inpatient rehab or Home Health rehab for his ongoing weakness and       stability.  Mr. Segel was discharged for Home Health       rehabilitation, continued physical therapy.   Consults: Ophthomology - Dr. Naoma Diener  Significant Diagnostic Studies:  PROCEDURES PERFORMED:   1. 2D echo without contrast.  Study conclusions:  Left ventricle       inferior and septal hypokinesis.  The cavity size is mildly       dilated.  Wall thickness was normal.  Estimated ejection fraction       was 50%.  Aortic valve calcified, non-coronary cusp atrial septum.       No defect or patent foramen ovale was identified.   2. Brain magnetic resonance imaging  and magnetic resonance       angiography.  The MRI head without contrast, impression, no acute       intracranial abnormality to explain the patient's symptoms,       interval lacunar infarct of the brainstem does not appear acute,       evolution of right cerebellar infarct, changes in left were       compatible with interval hemorrhage, mostly focal areas of remote       hemorrhage and blood breakdown products are stable.  Stable diffuse       small-vessel disease and multiple remote lacunar infarct involving       basal ganglia cerebellum and periventricular white matter also       seen.  The MRA of the head, arteriosclerotic irregularity of the       cavernous and precavernous internal carotid artery are present       bilaterally without definite focal stenosis.  Moderate proximal       stenosis of the most posterior-inferior right M2 branch.       Atherosclerotic irregularity of  the basilar artery without       significant basilar artery stenosis.  Bilateral PCA stenoses are       segmental and likely significant.   3. CT of the head was done on admission.  Impression was chronic       ischemic change but no acute abnormalities.   4. EKG was performed, it showed sinus rhythm.  There was some T-wave       inversion in the precordial leads, but this was consistent with       prior EKG done in August 2012.   Treatments: Withheld HTN meds for one day in setting of likely acute TIA, then added them back in.  Discharge Exam: Temperature 98.1, pulse 74, respirations 18,   systolic blood pressure [id number], diastolic blood pressure 87, oxygen   saturation 100% on room air   Disposition: Home-Health Care Svc  Discharge Orders    Future Appointments: Provider: Department: Dept Phone: Center:   03/09/2011 3:15 PM [place] [phone] [facility]     Cannot display discharge medications since this is not an admission.    Signed: Trenden Hazelrigg 03/06/2011, 11:17  AM  Note was left in Dr Rivka Barbara inbasket after her last day. I am signing the note to complete the process but was not involved in the pt's care.

## 2011-03-07 ENCOUNTER — Encounter (HOSPITAL_COMMUNITY): Payer: Self-pay

## 2011-03-09 ENCOUNTER — Ambulatory Visit (INDEPENDENT_AMBULATORY_CARE_PROVIDER_SITE_OTHER): Payer: Self-pay | Admitting: Internal Medicine

## 2011-03-09 ENCOUNTER — Encounter (HOSPITAL_COMMUNITY): Payer: Self-pay

## 2011-03-09 VITALS — BP 130/80 | Temp 97.3°F | Wt 213.1 lb

## 2011-03-09 DIAGNOSIS — R2681 Unsteadiness on feet: Secondary | ICD-10-CM

## 2011-03-09 DIAGNOSIS — I10 Essential (primary) hypertension: Secondary | ICD-10-CM

## 2011-03-09 DIAGNOSIS — E119 Type 2 diabetes mellitus without complications: Secondary | ICD-10-CM

## 2011-03-09 DIAGNOSIS — R269 Unspecified abnormalities of gait and mobility: Secondary | ICD-10-CM

## 2011-03-09 DIAGNOSIS — I639 Cerebral infarction, unspecified: Secondary | ICD-10-CM

## 2011-03-09 DIAGNOSIS — H53139 Sudden visual loss, unspecified eye: Secondary | ICD-10-CM

## 2011-03-09 DIAGNOSIS — I6350 Cerebral infarction due to unspecified occlusion or stenosis of unspecified cerebral artery: Secondary | ICD-10-CM

## 2011-03-09 MED ORDER — GLIPIZIDE 5 MG PO TABS: 5.0000 mg | ORAL_TABLET | Freq: Every day | ORAL | Status: DC

## 2011-03-09 NOTE — Progress Notes (Signed)
Subjective:   Patient ID: Kevin Soto male   DOB: 1960-09-02 50 y.o.   MRN: [id number]  HPI: [name] is a 50 y.o. male with PMH significant as outlined below who presented to the clinic for a hospital follow up. Patient was admitted for amaurosis fugax likely due to embolic ischemic event and weakness likely due history of previous stroke on 02/27/11. Patient noted that he has been doing fine since then. No further episodes of vision loss since then doing well after discharge.  PT evaluated the patient and will follow up.     Past Medical History  Diagnosis Date  . Diabetes mellitus   . Hypertension   . Macular hemorrhage   . Dyslipidemia   . Ischemic cardiomyopathy     echo 7/12: EF 40-45%, septal, apical, inf basal HK, mod LVH, mild MR, mild LAE  . Anemia   . CAD (coronary artery disease)     cath 8/12:  LM ok, mLAD occluded with trivial collats from right AM and dRCA, mDx (small) 80%, RI 25%, tiny branch of OM 90%, mAM 30-40%, RCA sub-totally occluded, then 80%, EF 40%, inf HK.  He underwent PCI with [name] with placement of a Promus DES to the mRCA x 2  . History of stroke     evidence of CVA on MRI in past  . Anemia   . History of alcohol abuse   . CVA (cerebral vascular accident) 11/28/2010   Current Outpatient Prescriptions  Medication Sig Dispense Refill  . aspirin EC 81 MG tablet Take 1 tablet (81 mg total) by mouth daily.      . carvedilol (COREG) 12.5 MG tablet Take 2 tablets (25 mg total) by mouth 2 (two) times daily.  60 tablet  6  . hydrochlorothiazide (MICROZIDE) 12.5 MG capsule Take 2 capsules (25 mg total) by mouth daily.  60 capsule  6  . lisinopril (PRINIVIL,ZESTRIL) 20 MG tablet Take 1 tablet (20 mg total) by mouth 2 (two) times daily.  60 tablet  6  . metFORMIN (GLUCOPHAGE) 500 MG tablet Take 2 tablets (1,000 mg total) by mouth 2 (two) times daily with a meal.  60 tablet  6  . oxyCODONE-acetaminophen (PERCOCET) 5-325 MG per tablet Take 1 tablet  by mouth every 6 (six) hours as needed.        . prasugrel (EFFIENT) 10 MG TABS TODAY WHEN YOU GET HOME YOU ARE TO TAKE 6 TABLETS TO = 60 MG TODAY; AFTER TODAY YOU WILL TAKE ONLY 1 TABLET DAILY.      . pravastatin (PRAVACHOL) 40 MG tablet Take 40 mg by mouth daily.        Marland Kitchen spironolactone (ALDACTONE) 25 MG tablet Take 1 tablet (25 mg total) by mouth daily.  30 tablet  6   Review of Systems: Constitutional: Denies fever, chills, diaphoresis, appetite change and fatigue.  HEENT: Denies photophobia, eye pain, redness, hearing loss, ear pain, congestion, sore throat, rhinorrhea, sneezing, mouth sores, trouble swallowing, neck pain, neck stiffness and tinnitus.   Respiratory: Denies SOB, DOE, cough, chest tightness,  and wheezing.   Cardiovascular: Denies chest pain, palpitations and leg swelling.  Gastrointestinal: Denies nausea, vomiting, abdominal pain, diarrhea, constipation, blood in stool and abdominal distention.  Musculoskeletal: Denies myalgias, back pain, joint swelling, arthralgias and gait problem.  Skin: Denies pallor, rash and wound.  Neurological: Denies dizziness, seizures, syncope,  light-headedness, numbness and headaches.  Hematological: Denies adenopathy. Easy bruising, personal or family bleeding history    Objective:  Physical Exam: Filed Vitals:   03/09/11 1518  BP: 162/83  Pulse: 87  Temp: 97.3 F (36.3 C)  TempSrc: Oral  Weight: 213 lb 1.6 oz (96.662 kg)   Constitutional: Vital signs reviewed.  Patient is a well-developed and well-nourished  in no acute distress and cooperative with exam. Alert and oriented x3.  Eyes: PERRL, EOMI, conjunctivae normal, No scleral icterus.  Neck: Supple,  Cardiovascular: RRR, S1 normal, S2 normal, no MRG, pulses symmetric and intact bilaterally Pulmonary/Chest: CTAB, no wheezes, rales, or rhonchi Abdominal: Soft. Non-tender, non-distended, bowel sounds are normal, no masses, organomegaly, or guarding present.  Musculoskeletal: No  joint deformities, erythema, or stiffness, ROM full and no nontender Neurological: A&O x3, no focal motor deficit, sensory intact to light touch bilaterally.  Skin: Warm, dry and intact. No rash, cyanosis, or clubbing.

## 2011-03-09 NOTE — Patient Instructions (Signed)
Please check your blood glucose level every morning before breakfast. If your blood glucose level is below 90 please call the clinic for further instruction

## 2011-03-10 ENCOUNTER — Encounter: Payer: Self-pay | Admitting: Internal Medicine

## 2011-03-10 NOTE — Assessment & Plan Note (Signed)
Patient has been taking in the past Carvedilol 25 mg BID ( prior to hospitalization). Per d/c summary it was noted that he should take 6.25 mg BID but patient has been taking 12.5 mg BID. Today's blood pressure is well controlled. Consider to elevated Carvedilol if BP elevated since patient need tight control of BP in the setting of dilated cardiomyopathy, CVA, amaurosis fugax and CAD.

## 2011-03-10 NOTE — Assessment & Plan Note (Addendum)
No vision loss since hospital discharge.  Will follow up with DR Racicot.

## 2011-03-10 NOTE — Assessment & Plan Note (Signed)
Likely due to history of stroke. CT, MRI, MRA of the Head was negative for acute process. Patient will do Home Health rehab and PT therapy is on hold per PT recommendation. Patient is followed up as an outpatient.

## 2011-03-10 NOTE — Assessment & Plan Note (Signed)
Blood glucose level during hospitalization elevated. Will initated Glipizide 5 mg and continue Metformin 1000 mg BID. Furthermore patient was recommended to monitor closely CBG on a daily basis before breakfast and to bring in the meter at the next office visit for possible changes in management.

## 2011-03-10 NOTE — Assessment & Plan Note (Signed)
Patient was on Effient which was d/c during hospital admission (02/27/11) and started on Plavix.

## 2011-03-12 ENCOUNTER — Encounter (HOSPITAL_COMMUNITY): Payer: Self-pay

## 2011-03-14 ENCOUNTER — Encounter (HOSPITAL_COMMUNITY): Payer: Self-pay

## 2011-03-16 ENCOUNTER — Encounter (HOSPITAL_COMMUNITY): Payer: Self-pay

## 2011-03-19 ENCOUNTER — Encounter (HOSPITAL_COMMUNITY): Payer: Self-pay

## 2011-03-20 NOTE — Discharge Summary (Signed)
[name], [name]             ACCOUNT NO.:  [id number]  MEDICAL RECORD NO.:  [id number]  LOCATION:  [id number]                         FACILITY:  [facility]  PHYSICIAN:  Alison Murray, M.D.  DATE OF BIRTH:  May 31, 1960  DATE OF ADMISSION:  02/27/2011 DATE OF DISCHARGE:  03/01/2011                              DISCHARGE SUMMARY   DISCHARGE DIAGNOSES: 1. Amaurosis fugax. 2. Weakness in left arm and left leg. 3. Poorly controlled Diabetes mellitus with AIC -8.7 in 10/12. 4. Coronary artery disease s/p drug eluting stent in RCA in 08/12. 5. Hypertension. 6. Anemia with baseline Hb- 10-11,needs outpatient colonoscopy. 7. Dyslipidemia.  DISCHARGE MEDICATIONS WITH ACCURATE DOSES: 1. Plavix 75 mg 1 tablet by mouth daily. 2. Aspirin 81 mg 1 tablet by mouth every morning. 3. Carvedilol 6.25 mg 1 tablet by mouth twice daily with meals. 4. Hydrochlorothiazide 12.5 mg 2 capsules by mouth daily. 5. Lisinopril 20 mg 1 tablet by mouth daily. 6. Metformin 500 mg 2 tablets by mouth twice daily. 7. Nitroglycerin sublingual 0.4 mg for chest pain 1 tablet every 5     minutes as needed, maximum of 3 doses. 8. Pravastatin 40 mg 1 tablet by mouth every morning. 9. Spironolactone 25 mg 1 tablet by mouth every morning.  DISPOSITION AND FOLLOWUP:  [name] was discharged from [facility] on March 01, 2011, in stable and improved condition.  He had no further neurologic events during his stay here in the hospital.  He has been scheduled for followup appointments on March 09, 2011, at 3:15 p.m. with Dr. Newt Lukes, at that appointment able to be important to follow up on any subsequent episodes of vision loss or weakness as well as following up on his history of anemia and developing a plan for further workup of his chronic anemia.  Additionally, given his elevated hemoglobin A1c here in the hospital, it is likely that he will need further modification of his medication regimen for  diabetes.  PROCEDURES PERFORMED: 1. 2D echo without contrast.  Study conclusions:  Left ventricle     inferior and septal hypokinesis.  The cavity size is mildly     dilated.  Wall thickness was normal.  Estimated ejection fraction     was 50%.  Aortic valve calcified, non-coronary cusp atrial septum.     No defect or patent foramen ovale was identified. 2. Brain magnetic resonance imaging and magnetic resonance     angiography.  The MRI head without contrast, impression, no acute     intracranial abnormality to explain the patient's symptoms,     interval lacunar infarct of the brainstem does not appear acute,     evolution of right cerebellar infarct, changes in left were     compatible with interval hemorrhage, mostly focal areas of remote     hemorrhage and blood breakdown products are stable.  Stable diffuse     small-vessel disease and multiple remote lacunar infarct involving     basal ganglia cerebellum and periventricular white matter also     seen.  The MRA of the head, arteriosclerotic irregularity of the     cavernous and precavernous internal carotid artery are present  bilaterally without definite focal stenosis.  Moderate proximal     stenosis of the most posterior-inferior right M2 branch.     Atherosclerotic irregularity of the basilar artery without     significant basilar artery stenosis.  Bilateral PCA stenoses are     segmental and likely significant. 3. CT of the head was done on admission.  Impression was chronic     ischemic change but no acute abnormalities. 4. EKG was performed, it showed sinus rhythm.  There was some T-wave     inversion in the precordial leads, but this was consistent with     prior EKG done in August 2012.  CONSULTATIONS:  Opthalmology, Dr.Sanders  for amaurosis fugax.  BRIEF ADMITTING HISTORY AND PHYSICAL:  [name] is a 50 year old man with history of diabetes, retinal hypertension with macular hemorrhage, coronary artery disease,  history of prior stroke and MI, dyslipidemia, and ischemic cardiomyopathy, who presented to his clinic this morning for 2 weeks of left arm and left leg weakness and while in clinic, suddenly had onset of left eye blindness.  This left eye loss of vision lasted for about 30 minutes and then resolved.  Kevin Soto described the loss of vision as everything getting blurry and cloudy.  During which, he was not able to see anything, but his vision returned to his baseline 30 minutes later.  At baseline, his left eye has much poor vision than his right eye.  The other problem that Kevin Soto was seen for his left arm and left leg weakness which he reported of 2 weeks' duration. He described his left side is occasionally giving out when he was trying to walk or get around.  This symptoms were significantly limiting his mobility.  Kevin Soto described both of these problems, his weakness and loss of vision as new problems.  On final view of Kevin Soto records, he was admitted in July 2012, to [facility] also for sudden vision loss in his left eye.  During this time, he was discovered to have hypertensive retinopathy, intraretinal and subretinal macular hemorrhages.  Other recent medical events included having a left eye vitrectomy in September 2012, as well as a PCI done with placement of 2 drug-eluting stents.  Upon admission, Kevin Soto denied fever, chills, new problems with bladder or bowel, chest pain, headache, falls, joint pain, nausea, vomiting, or other aches or pains or eye pain.  PHYSICAL EXAMINATION ON ADMISSION:  VITAL SIGNS:  165/88, pulse 70, respiratory rate 16, temperature 98.5, oxygen saturation 98% on room air. GENERAL APPEARANCE:  He is alert, cooperative, in no distress, appeared stated age. HEENT:  Head was normocephalic without any obvious abnormalities or trauma.  His eyes, pupils were equal and reactive to light.  Somewhat injected conjunctivae.  His extraocular  movements were intact.  Vision in his left eye was very poor.  Vision in his right eye appeared to be intact upon very basic acuity testing.  Exam of the fundi of his eye was difficult to interpret.  Nose, no mucosal drainage.  Throat, moist mucous membranes. NECK:  Supple, symmetrical.  No carotid bruits were heard. LUNGS:  Clear to auscultation bilaterally.  Respirations unlabored. HEART:  Regular rate and rhythm.  No murmurs, rubs, or gallops. ABDOMEN:  Soft, nontender.  Bowel sounds were normoactive in all 4 quadrants. EXTREMITIES:  Did not have any cyanosis or edema. SKIN:  Color, no rashes or lesions. NEUROLOGIC:  Full 5/5 strength in both the upper and lower extremities. Sensation  to light touch was intact bilaterally.  Reflexes were 2+ bilaterally.  Cranial nerve exam appeared intact beside from slight drooping of the left eyelid, however, Mr. Haq appeared to have full strength, not able to exam or open his eyes.  Romberg test was negative. Gait was unstable and hesitant.  Finger-to-nose testing was impaired, however, this might be related to decreased depth perception.  ADMISSION LABS:  CBC:  White blood count 11.5, hemoglobin 10.0, hematocrit 29.4, MCV 81, MCH 27.5, MCHC 34, RDW 13.9, platelet count 331.  Basic metabolic panel:  Sodium 0000000, potassium 4.0, chloride 102, CO2 25, glucose 152, BUN 25, creatinine 1.26, calcium 10.0.  PT 14.9, INR 1.15, PTT 34.  Hemoglobin A1c 8.7.  Urine drug screen negative. Alcohol screen negative.  Cardiac enzymes negative.  Urinalysis:  Yellow colored, clear appearance, specific gravity 1.023, pH 5.0, negative for glucose, negative for bilirubin, negative for ketones, negative for blood, protein 30, nitrite negative, leukocyte negative.  Microscopy showed rare squamous epithelial cells per low-powered field, 0-2 white blood cells per high-powered field, 0-2 red blood cells per high-powered field.  HOSPITAL COURSE BY PROBLEM: 1. Acute  vision loss in left eye: Given mutiple risk factors including     Diabetes, HTN, CAD and multiple lacunar infarcts, TIA  was high on differential.     But the patient presented with similar complaint in 08/12 when opthalmologist     commented on macular hemmorhhages and changes consistent with retinopathy in his eyes.    He was hospitalized for possible TIA.  Mr. Seppala was given CT scan, head    MRI, MRA, none of which showed any acute changes.  Workup in the    hospital was negative for any acute event requiring an    intervention.  Mr. Cannada was seen by his outpatient    ophthalmologist in hospital who believed that Mr. Dilmore was    stable and that he should follow up to be seen again in 2 months at    his outpatient appointment. 2. Weakness in arm and leg.  As mentioned above, Mr. Cedillos had     multiple imaging studies done which were negative for any acute     changes, however, did show chronic ischemic changes and damage to     his brain.  He was seen by Physical Therapy who recommended either     inpatient rehab or Home Health rehab for his ongoing weakness and     stability.  Mr. Hedrick was discharged for Home Health     rehabilitation, continued physical therapy. 3. Diabetes mellitus.  This was stable while Mr. Carfagno was in     hospital.  His diabetes hemoglobin A1c was elevated at 8.7,     suggesting that his diabetes would be better controlled.  He was     discharged from hospital on his home medication regimen of     metformin. 4. Coronary artery disease.  Mr. Burczyk was continued on aspirin 325     mg.  Initially on this admission, his prasugrel was held, however,     he was restarted on it and then switched to Plavix for continued     therapy as well as he was continued on his aspirin. 5. Hypertension.  Upon arrival in the context of potential acute     stroke, Mr. Dagen hypertension medications  were held,     however, they were restarted during his  hospitalization and he is     being discharged  on his home hypertension medications. 6. Anemia.  Mr. Jamonta has chronic anemia that was first noted in his     July 2012 hospitalization.  His hemoglobin and hematocrit was     stable since then, however, he needs continued monitoring and     followup.  Recommend outpatient colonoscopy at some point. 7. Dyslipidemia.  Mr. Newlon dose of pravastatin was increased to     40 mg each evening while he was hospitalized that is the dose he     was sent home on.    DISCHARGE VITAL SIGNS:  Temperature 98.1, pulse 74, respirations 18, systolic blood pressure [id number], diastolic blood pressure 87, oxygen saturation 100% on room air.  DISCHARGE LABS:  CBC:  White blood cell count 10.1, hemoglobin 9.1, hematocrit 27.2, MCV 81, MCH 27.1, MCHC 33.5, platelet count 299.  Lipid panel was performed, cholesterol 133, triglyceride 138, HDL 32, LDL 78, VLDL 28.  Additionally, ESR was performed, 75.  C-reactive protein was performed, 0.45.    ______________________________ Jimmye Norman   ______________________________ Alison Murray, M.D.    DW/MEDQ  D:  03/01/2011  T:  03/01/2011  Job:  [id number]  cc:   Ivor Costa, MD Rosalia Hammers, MD Dr. Naoma Diener Minus Breeding, MD, [facility]  Electronically Signed by Pedro Earls MD on 03/18/2011 08:39:46 PM Electronically Signed by Lars Mage M.D. on 03/20/2011 02:44:46 PM

## 2011-03-21 ENCOUNTER — Encounter (HOSPITAL_COMMUNITY): Payer: Self-pay

## 2011-03-23 ENCOUNTER — Encounter (HOSPITAL_COMMUNITY): Payer: Self-pay

## 2011-03-26 ENCOUNTER — Encounter (HOSPITAL_COMMUNITY): Payer: Self-pay

## 2011-03-27 ENCOUNTER — Other Ambulatory Visit: Payer: Self-pay | Admitting: *Deleted

## 2011-03-27 DIAGNOSIS — I10 Essential (primary) hypertension: Secondary | ICD-10-CM

## 2011-03-27 DIAGNOSIS — I639 Cerebral infarction, unspecified: Secondary | ICD-10-CM

## 2011-03-27 DIAGNOSIS — E119 Type 2 diabetes mellitus without complications: Secondary | ICD-10-CM

## 2011-03-27 NOTE — Telephone Encounter (Signed)
Please do these refills if appropriate.

## 2011-03-28 ENCOUNTER — Encounter (HOSPITAL_COMMUNITY): Payer: Self-pay

## 2011-03-29 MED ORDER — LISINOPRIL 20 MG PO TABS: 20.0000 mg | ORAL_TABLET | Freq: Two times a day (BID) | ORAL | Status: DC

## 2011-03-29 MED ORDER — METFORMIN HCL 500 MG PO TABS: 1000.0000 mg | ORAL_TABLET | Freq: Two times a day (BID) | ORAL | Status: DC

## 2011-03-29 MED ORDER — CLOPIDOGREL BISULFATE 75 MG PO TABS: 75.0000 mg | ORAL_TABLET | Freq: Every day | ORAL | Status: DC

## 2011-03-30 ENCOUNTER — Other Ambulatory Visit: Payer: Self-pay | Admitting: Internal Medicine

## 2011-03-30 ENCOUNTER — Encounter (HOSPITAL_COMMUNITY): Payer: Self-pay

## 2011-03-30 DIAGNOSIS — E119 Type 2 diabetes mellitus without complications: Secondary | ICD-10-CM

## 2011-03-30 MED ORDER — METFORMIN HCL 1000 MG PO TABS: 1000.0000 mg | ORAL_TABLET | Freq: Two times a day (BID) | ORAL | Status: DC

## 2011-03-30 NOTE — Telephone Encounter (Signed)
Metformin 1000mg , Plavix , and Lisinopril called to [facility]. Pt was called and made aware.

## 2011-03-30 NOTE — Telephone Encounter (Signed)
I changed the dosage and send a new prescription to the pharmacy.

## 2011-03-30 NOTE — Telephone Encounter (Signed)
Dr Victorio Palm wants to be sure for Metformin is it 500mg  2 tabs 2 times daily b/c Otho Darner is only #60  Or is it  2 tabs only once daily. Thanks

## 2011-04-02 ENCOUNTER — Encounter (HOSPITAL_COMMUNITY): Payer: Self-pay

## 2011-04-04 ENCOUNTER — Encounter (HOSPITAL_COMMUNITY): Payer: Self-pay

## 2011-04-06 ENCOUNTER — Encounter (HOSPITAL_COMMUNITY): Payer: Self-pay

## 2011-04-09 ENCOUNTER — Encounter (HOSPITAL_COMMUNITY): Payer: Self-pay

## 2011-04-11 ENCOUNTER — Encounter (HOSPITAL_COMMUNITY): Payer: Self-pay

## 2011-04-11 ENCOUNTER — Other Ambulatory Visit: Payer: Self-pay | Admitting: *Deleted

## 2011-04-11 DIAGNOSIS — E785 Hyperlipidemia, unspecified: Secondary | ICD-10-CM

## 2011-04-11 MED ORDER — PRAVASTATIN SODIUM 40 MG PO TABS: 40.0000 mg | ORAL_TABLET | Freq: Every day | ORAL | Status: DC

## 2011-04-16 ENCOUNTER — Encounter (HOSPITAL_COMMUNITY): Payer: Self-pay

## 2011-04-18 ENCOUNTER — Encounter (HOSPITAL_COMMUNITY): Payer: Self-pay

## 2011-04-20 ENCOUNTER — Encounter (HOSPITAL_COMMUNITY): Payer: Self-pay

## 2011-04-23 ENCOUNTER — Encounter (HOSPITAL_COMMUNITY): Payer: Self-pay

## 2011-04-25 ENCOUNTER — Encounter (HOSPITAL_COMMUNITY): Payer: Self-pay

## 2011-04-27 ENCOUNTER — Encounter (HOSPITAL_COMMUNITY): Payer: Self-pay

## 2011-04-30 ENCOUNTER — Encounter (HOSPITAL_COMMUNITY): Payer: Self-pay

## 2011-05-02 ENCOUNTER — Encounter (HOSPITAL_COMMUNITY): Payer: Self-pay

## 2011-05-03 ENCOUNTER — Other Ambulatory Visit: Payer: Self-pay | Admitting: *Deleted

## 2011-05-03 DIAGNOSIS — I10 Essential (primary) hypertension: Secondary | ICD-10-CM

## 2011-05-03 MED ORDER — SPIRONOLACTONE 25 MG PO TABS: 25.0000 mg | ORAL_TABLET | Freq: Every day | ORAL | Status: DC

## 2011-05-03 NOTE — Telephone Encounter (Signed)
Also requesting refill on: Nitrostat 0.4mg   Qty #100. [facility] needs new rxs. Thanks

## 2011-05-04 ENCOUNTER — Other Ambulatory Visit: Payer: Self-pay | Admitting: Internal Medicine

## 2011-05-04 ENCOUNTER — Encounter (HOSPITAL_COMMUNITY): Payer: Self-pay

## 2011-05-04 ENCOUNTER — Other Ambulatory Visit: Payer: Self-pay | Admitting: *Deleted

## 2011-05-04 NOTE — Telephone Encounter (Signed)
Dr  Blaine Hamper, there's also a request for Nitrostat; do you to refill this medication. Thanks

## 2011-05-04 NOTE — Telephone Encounter (Signed)
Hi, Kevin Soto,   It is OK to give refill for [employer].  Dr. Blaine Hamper

## 2011-05-04 NOTE — Telephone Encounter (Signed)
Dr Blaine Hamper can you put in the  order for Nitrostat.  Thanks

## 2011-05-04 NOTE — Telephone Encounter (Signed)
Aldactone refilled-rx request from faxed to [facility].

## 2011-05-04 NOTE — Telephone Encounter (Signed)
Hi, Glanda, I am sorry for this confusion. The Nitrostat is not on his med list. I am concerned if he uses it often. Please let him to get an appointment to be evaluated, before getting refill.  Sorry.  Dr. Blaine Hamper

## 2011-05-04 NOTE — Telephone Encounter (Signed)
There's a refill request for Nitrostat; do you want to refill this medication? Thanks

## 2011-05-07 ENCOUNTER — Encounter (HOSPITAL_COMMUNITY): Payer: Self-pay

## 2011-05-08 NOTE — Telephone Encounter (Signed)
[facility] made awared.

## 2011-05-09 ENCOUNTER — Encounter (HOSPITAL_COMMUNITY): Payer: Self-pay

## 2011-05-10 NOTE — Telephone Encounter (Signed)
   Mr. Nils Flack called clinic for getting a refill of Nitrostat on 05/03/11. I asked Regino Schultze to tell patient that he needs to come to clinic for evaluation. I did not hear back until now. I called patient home yesterday and she did not pick up the phone. I called again today, still no one pick up the phone. I left a message telling him that if he has chest pain, it is serious problem and he needs to come to hospital for evaluation. Hopefully, he will show up.    Ivor Costa

## 2011-05-11 ENCOUNTER — Encounter (HOSPITAL_COMMUNITY): Payer: Self-pay

## 2011-05-11 NOTE — Telephone Encounter (Signed)
Pt returned call today.  He is NOT having any chest pain and is feeling well.  He does NOT need nitrostat or any other med refilled at this time.

## 2011-05-14 ENCOUNTER — Encounter (HOSPITAL_COMMUNITY): Payer: Self-pay

## 2011-05-16 ENCOUNTER — Encounter (HOSPITAL_COMMUNITY): Payer: Self-pay

## 2011-05-18 ENCOUNTER — Encounter (HOSPITAL_COMMUNITY): Payer: Self-pay

## 2011-05-24 ENCOUNTER — Telehealth: Payer: Self-pay | Admitting: Dietician

## 2011-05-24 ENCOUNTER — Other Ambulatory Visit: Payer: Self-pay | Admitting: *Deleted

## 2011-05-24 DIAGNOSIS — E119 Type 2 diabetes mellitus without complications: Secondary | ICD-10-CM

## 2011-05-24 MED ORDER — GLIPIZIDE 5 MG PO TABS: 5.0000 mg | ORAL_TABLET | Freq: Every day | ORAL | Status: DC

## 2011-05-24 NOTE — Telephone Encounter (Signed)
patient scheduled an appointment for next week with CDE.

## 2011-05-25 NOTE — Telephone Encounter (Signed)
Called to pharm 

## 2011-05-29 ENCOUNTER — Ambulatory Visit (INDEPENDENT_AMBULATORY_CARE_PROVIDER_SITE_OTHER): Payer: Self-pay | Admitting: Dietician

## 2011-05-29 VITALS — Ht 72.0 in | Wt 206.4 lb

## 2011-05-29 DIAGNOSIS — E119 Type 2 diabetes mellitus without complications: Secondary | ICD-10-CM

## 2011-05-29 LAB — GLUCOSE, CAPILLARY: Glucose-Capillary: 120 mg/dL — ABNORMAL HIGH (ref 70–99)

## 2011-05-29 NOTE — Patient Instructions (Signed)
Your doing wonderful!! Keep it up!!!  Please make a follow up with your doctor as soon as possible  Suggested food changes to help lower blood pressure and cholesterol:  Add more fruits and veggies if you can- goal is 10 servings a day and a serving is about 1/2 a fist to a fistful- add fruit to salad, have fruit at breakfast  Add 2 servings of dairy per day- 1% milk- 8 ounces, milk at breakfast, yogurt as a fruit dip,

## 2011-05-29 NOTE — Progress Notes (Signed)
Diabetes Self-Management Training (DSMT)  Initial Visit  05/29/2011 Mr. Rolf Stangland, identified by name and date of birth, is a 51 y.o. male with Type 2 Diabetes. Year of diabetes diagnosis: 2012 Other persons present: sister  ASSESSMENT Patient concerns are Monitoring and Problem solving.  Height 6' (1.829 m), weight 206 lb 6.4 oz (93.622 kg). Body mass index is 27.99 kg/(m^2). Lab Results  Component Value Date   LDLCALC 78 02/28/2011   Lab Results  Component Value Date   HGBA1C 5.8 05/29/2011   Medication Nutrition Monitor: wavesense- download to be scanned.  Self foot exams daily: Not addressed today due to various other issues Diabetes Complications: Retinopathy Patient with retinal detachment recently Labs reviewed.  DIABETES BUNDLE: A1C in past 6 months? Yes.  Less than 7%?yes LDL in past year? Yes.  Less than 100 mg/dL? No- due again soon Microalbumin ratio in past year? No. Patient taking ACE or ARB? Yes. Blood pressure less than 130/80? Yes. Foot exam in last year? Yes. Eye exam in past year? Yes. Tobacco use? No. Pneumovax? Yes Flu vaccine? yes Asprin? Yes  Family history of diabetes:not sure Support systems: significant other Special needs: Large print, Simplified materials, Verbal instruction Prior DM Education: Yes   Medications See Medications list. Taking as prescribed.  Is interested in learning more    Exercise Plan Doing ADLs for 60 minutesa day. Is trying to do squats, chair exercises, walks in house as much as possible   Self-Monitoring Frequency of testing: 1-2 times a day Breakfast: 108-166- some after breakfast Lunch: 145-200- again some after eating Dinner: [phone], bedtime 208  Hyperglycemia: Yes Weekly Hypoglycemia: No   Meal Planning Some knowledge- needs review using simple meal planning methods such as plate method to encourage weight loss and healthy fats  Assessment comments: Improved blood sugars as evidenced by A1C of  5.8%. No report of any low blood sugars. Eats 3 meals and 2-3 snacks/day Weight still slowly decreasing as appropriate. Encouraged further 5-10# loss.  Patient seeing retinal specialist this Friday for more surgery. Still has blurred vision and balance problems. Is willing to start therapy- physical therapy vs balance  therapy vs, low vision services. Appears to need improved blood pressure by home monitoring: 150/91, 133/86, 159/96, 156/90, 154/87, 153/93, 157/96, 174/86, 136/86, 154/89   INDIVIDUAL DIABETES EDUCATION PLAN:  Diabetes disease state Nutrition management Medication Monitoring _______________________________________________________________________  Intervention TOPICS COVERED TODAY:  Diabetes disease state Definition of diabetes, type 1 and 2, and the diagnosis of diabetes. Nutrition management  Role of diet in the treatment of diabetes and the relationship between the three main macronutritents and blood glucose control. Reviewed blood glucose goals for pre and post meals and how to evaluate the patients' food intake on their blood glucose level. Medication  Reviewed patients medication for diabetes, action, purpose, timing of dose and side effects. Monitoring  Taught/evaluated SMBG with one touch ultra mini meter. Purpose and frequency of SMBG. Identified appropriate SMBG and A1C goals.  PATIENTS GOALS/PLAN (copy and paste in patient instructions so patient receives a copy): 1.  Learning Objective:       To know how to eat well balanced meals, fit in favorite foods and still maintain diabetes control 2.  Behavioral Objective:         Nutrition: To improve blood glucose control I will follow meal plan of half a plate of veggie, one fourth carbs, one fourth baked broiled or grilled protein foods Half of the time 100% New nutrition goals: eat a  serving of fruit at breakfast and at least 1 serving of low fat dairy each day Medications: To improve blood glucose levels, I will take  my medication as prescribed Always 100% Monitoring: To identify blood glucose trends, I will test my blood glucose 2 times until follow up day Never 100%  Personalized Follow-Up Plan for Ongoing Self Management Support:  Sisters and niece, Theatre manager, friends and CDE visits ______________________________________________________________________   Outcomes Expected outcomes: Demonstrated interest in learning.Expect positive changes in lifestyle. Self-care Barriers: Impaired vision, Debilitated state due to current medical condition, Unsteady gait/risk for falls, Lack of material resources Education material provided: meter download that showed most blood sugars in past month in target, simple DASH diet information Patient to contact team via Phone if problems or questions. Time in: 0900     Time out: 1005 Future DSMT - 12 weeks   Plyler, [name]

## 2011-06-22 ENCOUNTER — Ambulatory Visit (INDEPENDENT_AMBULATORY_CARE_PROVIDER_SITE_OTHER): Payer: Self-pay | Admitting: Internal Medicine

## 2011-06-22 ENCOUNTER — Encounter: Payer: Self-pay | Admitting: Internal Medicine

## 2011-06-22 VITALS — BP 117/74 | HR 74 | Temp 98.0°F | Ht 72.0 in | Wt 207.1 lb

## 2011-06-22 DIAGNOSIS — I10 Essential (primary) hypertension: Secondary | ICD-10-CM

## 2011-06-22 DIAGNOSIS — D649 Anemia, unspecified: Secondary | ICD-10-CM

## 2011-06-22 DIAGNOSIS — E785 Hyperlipidemia, unspecified: Secondary | ICD-10-CM

## 2011-06-22 DIAGNOSIS — E119 Type 2 diabetes mellitus without complications: Secondary | ICD-10-CM

## 2011-06-22 DIAGNOSIS — R269 Unspecified abnormalities of gait and mobility: Secondary | ICD-10-CM

## 2011-06-22 NOTE — Patient Instructions (Signed)
1. Please follow-up in the clinic in 3 months. 2. Please take all medications as prescribed.  3. If you have worsening of your symptoms or new symptoms arise, please call the clinic [phone] or go to the ER immediately if symptoms are severe.

## 2011-06-23 NOTE — Progress Notes (Signed)
Subjective:   Patient ID: Kevin Soto male   DOB: 10-Jul-1960 51 y.o.   MRN: [id number]  HPI: [name] is a 51 y.o.   [name] is a 51 y.o. male with PMH significant as outlined below who presented to the clinic for regular follow up.   Patient does not have new complaints. He still has gait instability and feel weak on the left side of his body, particularly his left hand after his stroke in 11/2010 . PT evaluated the patient for possible physical therapy, but since patient is going to do eye surgery on next Monday due to retinal bleeding. PT is on hold now. Patient reports that he is taking his medications regularly and just wants to have a regular check up.  Denies fever, chills, headaches,  cough, chest pain, SOB,  abdominal pain,diarrhea, constipation, dysuria, urgency, frequency, hematuria, joint pain or leg swelling.  Past Medical History  Diagnosis Date  . Diabetes mellitus   . Hypertension   . Macular hemorrhage   . Dyslipidemia   . Ischemic cardiomyopathy     echo 7/12: EF 40-45%, septal, apical, inf basal HK, mod LVH, mild MR, mild LAE  . Anemia   . CAD (coronary artery disease)     cath 8/12:  LM ok, mLAD occluded with trivial collats from right AM and dRCA, mDx (small) 80%, RI 25%, tiny branch of OM 90%, mAM 30-40%, RCA sub-totally occluded, then 80%, EF 40%, inf HK.  He underwent PCI with [name] with placement of a Promus DES to the mRCA x 2  . History of stroke     evidence of CVA on MRI in past  . Anemia   . History of alcohol abuse   . CVA (cerebral vascular accident) 11/28/2010  . Vision, loss, sudden 02/27/2011  . Gait instability 02/27/2011   Current Outpatient Prescriptions  Medication Sig Dispense Refill  . aspirin EC 81 MG tablet Take 1 tablet (81 mg total) by mouth daily.      . carvedilol (COREG) 12.5 MG tablet Take 2 tablets (25 mg total) by mouth 2 (two) times daily.  60 tablet  6  . clopidogrel (PLAVIX) 75 MG tablet Take 1 tablet  (75 mg total) by mouth daily.  30 tablet  3  . glipiZIDE (GLUCOTROL) 5 MG tablet Take 1 tablet (5 mg total) by mouth daily.  90 tablet  0  . hydrochlorothiazide (MICROZIDE) 12.5 MG capsule Take 2 capsules (25 mg total) by mouth daily.  60 capsule  6  . lisinopril (PRINIVIL,ZESTRIL) 20 MG tablet Take 1 tablet (20 mg total) by mouth 2 (two) times daily.  60 tablet  3  . metFORMIN (GLUCOPHAGE) 1000 MG tablet Take 1 tablet (1,000 mg total) by mouth 2 (two) times daily with a meal.  60 tablet  3  . oxyCODONE-acetaminophen (PERCOCET) 5-325 MG per tablet Take 1 tablet by mouth every 6 (six) hours as needed.        . pravastatin (PRAVACHOL) 40 MG tablet Take 1 tablet (40 mg total) by mouth daily.  30 tablet  6  . spironolactone (ALDACTONE) 25 MG tablet Take 1 tablet (25 mg total) by mouth daily.  30 tablet  6   Family History  Problem Relation Age of Onset  . Heart attack Mother     Died 61  . Heart failure Mother   . Diabetes Mother   . Heart attack Father   . Heart failure Father   . Diabetes Father   .  Heart disease Sister     s/p CABG x3 in her mid 42's  . Coronary artery disease Other     Premature   History   Social History  . Marital Status: Single    Spouse Name: N/A    Number of Children: 0  . Years of Education: N/A   Occupational History  . Stage hand    Social History Main Topics  . Smoking status: Never Smoker   . Smokeless tobacco: Never Used  . Alcohol Use: 15.6 oz/week    6 Cans of beer, 20 Shots of liquor per week     6-pack of beer a week/ half a fifth of liquor each week  . Drug Use: No  . Sexually Active: Yes   Other Topics Concern  . None   Social History Narrative   [place] with his cousinNo childrenExercises 3 times a week   Review of Systems:  General: no fevers, chills, no changes in body weight, no changes in appetite Skin: no rash HEENT: Decreased acuity in left eye, EOMI, pupils equal, pupils round, pupils reactive to light, no  injection and anicteric.  Pulm: no dyspnea, coughing, wheezing CV: no chest pain, palpitations, shortness of breath Abd: no nausea/vomiting, abdominal pain, diarrhea/constipation GU: no dysuria, hematuria, polyuria Ext: no arthralgias, myalgias Neuro: weakness on the left side of body.  Objective:  Physical Exam: Filed Vitals:   06/22/11 0937  BP: 117/74  Pulse: 74  Temp: 98 F (36.7 C)  TempSrc: Oral  Height: 6' (1.829 m)  Weight: 207 lb 1.6 oz (93.94 kg)    General: resting in bed, not in acute distress HEENT: PERRL, EOMI, no scleral icterus. Decreased visual acuity in his left eyes. Cardiac: S1/S2, RRR, No murmurs, gallops or rubs Pulm: Good air movement bilaterally, Clear to auscultation bilaterally, No rales, wheezing, rhonchi or rubs. Abd: Soft,  nondistended, nontender, no rebound pain, no organomegaly, BS present Ext: No rashes or edema, 2+DP/PT pulse bilaterally Neuro: alert and oriented X3, cranial nerves II-XII grossly intact.  left side paralysis. Muscle strength 3/5 in left lower extremety and 2/5 in left upper extremeity.   Assessment & Plan:    # Hypertension - his bp is well controlled. Bp is 117/74 at clinic. Will continue his current regimen.  # Diabetes mellitus type II - well controlled. His A1c is 5.8 on 05/29/11. Will continue his current regimen.  #  Gait instability - most likely due to history of stroke. PT evaluated patient. Since patient is going to do eye surgery on next week. So the PT is on hold. Will follow up.   #. Anemia: patient's Hgb is 9.1 on 03/01/11. His anemia panel showed iron 42, TIBC 327, Ferritin 230, indicating non-iron deficient anemia. Vb12 level was 287 which is at low normal range. Will get methylmalonic acid level to assure that Vb12 is normal. Also give GI referral for colonoscopy.   # HLD: LDL was 78 on 02/28/11. His AST and ALT was normal on 02/27/12. Will continue current pravastatin.   Ivor Costa

## 2011-06-25 ENCOUNTER — Other Ambulatory Visit: Payer: Self-pay | Admitting: Cardiology

## 2011-06-25 MED ORDER — CARVEDILOL 25 MG PO TABS: 25.0000 mg | ORAL_TABLET | Freq: Two times a day (BID) | ORAL | Status: DC

## 2011-06-26 LAB — METHYLMALONIC ACID, SERUM: Methylmalonic Acid, Quant: 0.39 umol/L (ref <0.40)

## 2011-06-28 ENCOUNTER — Other Ambulatory Visit: Payer: Self-pay | Admitting: Cardiology

## 2011-08-03 ENCOUNTER — Encounter: Payer: Self-pay | Admitting: Gastroenterology

## 2011-08-06 ENCOUNTER — Other Ambulatory Visit: Payer: Self-pay | Admitting: Internal Medicine

## 2011-08-13 ENCOUNTER — Other Ambulatory Visit: Payer: Self-pay | Admitting: *Deleted

## 2011-08-13 DIAGNOSIS — E119 Type 2 diabetes mellitus without complications: Secondary | ICD-10-CM

## 2011-08-13 MED ORDER — METFORMIN HCL 1000 MG PO TABS: 1000.0000 mg | ORAL_TABLET | Freq: Two times a day (BID) | ORAL | Status: DC

## 2011-08-13 NOTE — Telephone Encounter (Signed)
Rx called to pharmacy

## 2011-08-14 ENCOUNTER — Encounter: Payer: Self-pay | Admitting: Internal Medicine

## 2011-08-14 ENCOUNTER — Encounter: Payer: Self-pay | Admitting: Cardiology

## 2011-08-14 ENCOUNTER — Ambulatory Visit (INDEPENDENT_AMBULATORY_CARE_PROVIDER_SITE_OTHER): Payer: Self-pay | Admitting: Cardiology

## 2011-08-14 ENCOUNTER — Ambulatory Visit (INDEPENDENT_AMBULATORY_CARE_PROVIDER_SITE_OTHER): Payer: Self-pay | Admitting: Internal Medicine

## 2011-08-14 VITALS — BP 137/87 | HR 85 | Temp 97.8°F | Ht 72.0 in | Wt 201.8 lb

## 2011-08-14 VITALS — BP 125/80 | HR 86 | Ht 72.0 in | Wt 203.0 lb

## 2011-08-14 DIAGNOSIS — I10 Essential (primary) hypertension: Secondary | ICD-10-CM

## 2011-08-14 DIAGNOSIS — I420 Dilated cardiomyopathy: Secondary | ICD-10-CM

## 2011-08-14 DIAGNOSIS — I2510 Atherosclerotic heart disease of native coronary artery without angina pectoris: Secondary | ICD-10-CM

## 2011-08-14 DIAGNOSIS — I428 Other cardiomyopathies: Secondary | ICD-10-CM

## 2011-08-14 DIAGNOSIS — E785 Hyperlipidemia, unspecified: Secondary | ICD-10-CM

## 2011-08-14 DIAGNOSIS — D649 Anemia, unspecified: Secondary | ICD-10-CM

## 2011-08-14 NOTE — Assessment & Plan Note (Signed)
This is being managed in the context of treating his CHF

## 2011-08-14 NOTE — Patient Instructions (Signed)
1. You did great job in taking all your medications. I appreciate your effort very much.  2. Please take all medications as prescribed.  3. If you have worsening of your symptoms or new symptoms arise, please call the clinic [phone]), or go to the ER immediately if symptoms are severe.

## 2011-08-14 NOTE — Assessment & Plan Note (Signed)
The patient has no new sypmtoms.  No further cardiovascular testing is indicated.  We will continue with aggressive risk reduction and meds as listed.  

## 2011-08-14 NOTE — Assessment & Plan Note (Signed)
His lipid profile in Oct was at target.  No change in therapy is indicated.

## 2011-08-14 NOTE — Progress Notes (Signed)
HPI The patient presents for follow up of CAD.  Since I last saw him he has done well. The patient denies any new symptoms such as chest discomfort, neck or arm discomfort. There has been no new shortness of breath, PND or orthopnea. There have been no reported palpitations, presyncope or syncope. He is trying to do some PT on his own.  His equilibrium is off. He has not recovered completely from a stroke.  Allergies  Allergen Reactions  . Clonidine Derivatives     Dizzy, too sleepy    Current Outpatient Prescriptions  Medication Sig Dispense Refill  . aspirin EC 81 MG tablet Take 1 tablet (81 mg total) by mouth daily.      . carvedilol (COREG) 12.5 MG tablet TAKE ONE TABLET BY MOUTH TWICE DAILY  60 tablet  6  . carvedilol (COREG) 25 MG tablet Take 1 tablet (25 mg total) by mouth 2 (two) times daily with a meal.  60 tablet  6  . clopidogrel (PLAVIX) 75 MG tablet TAKE 1 TABLET BY MOUTH ONCE DAILY  30 tablet  3  . glipiZIDE (GLUCOTROL) 5 MG tablet Take 1 tablet (5 mg total) by mouth daily.  90 tablet  0  . hydrochlorothiazide (MICROZIDE) 12.5 MG capsule Take 2 capsules (25 mg total) by mouth daily.  60 capsule  6  . lisinopril (PRINIVIL,ZESTRIL) 20 MG tablet Take 1 tablet (20 mg total) by mouth 2 (two) times daily.  60 tablet  3  . metFORMIN (GLUCOPHAGE) 1000 MG tablet Take 1 tablet (1,000 mg total) by mouth 2 (two) times daily with a meal.  60 tablet  3  . nitroGLYCERIN (NITROSTAT) 0.4 MG SL tablet Place 0.4 mg under the tongue every 5 (five) minutes as needed.      Marland Kitchen oxyCODONE-acetaminophen (PERCOCET) 5-325 MG per tablet Take 1 tablet by mouth every 6 (six) hours as needed.        . pravastatin (PRAVACHOL) 40 MG tablet Take 1 tablet (40 mg total) by mouth daily.  30 tablet  6  . spironolactone (ALDACTONE) 25 MG tablet Take 1 tablet (25 mg total) by mouth daily.  30 tablet  6    Past Medical History  Diagnosis Date  . Diabetes mellitus   . Hypertension   . Macular hemorrhage   .  Dyslipidemia   . Ischemic cardiomyopathy     echo 7/12: EF 40-45%, septal, apical, inf basal HK, mod LVH, mild MR, mild LAE  . Anemia   . CAD (coronary artery disease)     cath 8/12:  LM ok, mLAD occluded with trivial collats from right AM and dRCA, mDx (small) 80%, RI 25%, tiny branch of OM 90%, mAM 30-40%, RCA sub-totally occluded, then 80%, EF 40%, inf HK.  He underwent PCI with [name] with placement of a Promus DES to the mRCA x 2  . History of stroke     evidence of CVA on MRI in past  . Anemia   . History of alcohol abuse   . CVA (cerebral vascular accident) 11/28/2010  . Vision, loss, sudden 02/27/2011  . Gait instability 02/27/2011    No past surgical history on file.  ROS:  As stated in the HPI and negative for all other systems.  PHYSICAL EXAM BP 125/80  Pulse 86  Ht 6' (1.829 m)  Wt 203 lb (92.08 kg)  BMI 27.53 kg/m2 GENERAL:  Well appearing HEENT:  Pupils equal round and reactive, fundi not visualized, oral mucosa unremarkable  NECK:  No jugular venous distention, waveform within normal limits, carotid upstroke brisk and symmetric, no bruits, no thyromegaly LYMPHATICS:  No cervical, inguinal adenopathy LUNGS:  Clear to auscultation bilaterally BACK:  No CVA tenderness CHEST:  Unremarkable HEART:  PMI not displaced or sustained,S1 and S2 within normal limits, no S3, no clicks, no rubs, no , positive S4 ABD:  Flat, positive bowel sounds normal in frequency in pitch, no bruits, no rebound, no guarding, no midline pulsatile mass, no hepatomegaly, no splenomegaly EXT:  2 plus pulses throughout, no edema, no cyanosis no clubbing SKIN:  No rashes no nodules NEURO:  Cranial nerves II through XII grossly intact, motor diffuse weakness PSYCH:  Cognitively intact, oriented to person place and time, halting speech.   EKG:  Sinus rhythm, rate 86, axis within normal limits, intervals within normal limits, inferolateral T wave inversions unchanged from previous EKGs  08/14/2011  ASSESSMENT AND PLAN

## 2011-08-14 NOTE — Progress Notes (Signed)
Subjective:   Patient ID: Kevin Soto male   DOB: 1960/05/27 51 y.o.   MRN: [id number]  HPI:  [name] is a 51 y.o. male with PMH significant as outlined below who presented to the clinic for regular follow up.   Patient does not have new complaints. He has been taking all his medications regularly. He still has gait instability and feels weak on the left side of his body, particularly his left hand after his stroke in 11/2010 .  He is trying to do some PT on his own. He reports that he just saw his cardiologist this AM. He was told the his ischemic cardiomyopathy and CAD are stable. There has been no new shortness of breath, PND or orthopnea.   Regarding his macular hemorrhage, he had laser surgery to his right eye on Feb of 2013. He is to be followed up by his ophthalmologist on June for his left eye. There is no new symptoms to both eyes.  Denies fever, chills, headaches, cough, chest pain, SOB, abdominal pain,diarrhea, constipation, dysuria, urgency, frequency, hematuria, joint pain or leg swelling.     Past Medical History  Diagnosis Date  . Diabetes mellitus   . Hypertension   . Macular hemorrhage   . Dyslipidemia   . Ischemic cardiomyopathy     echo 7/12: EF 40-45%, septal, apical, inf basal HK, mod LVH, mild MR, mild LAE  . Anemia   . CAD (coronary artery disease)     cath 8/12:  LM ok, mLAD occluded with trivial collats from right AM and dRCA, mDx (small) 80%, RI 25%, tiny branch of OM 90%, mAM 30-40%, RCA sub-totally occluded, then 80%, EF 40%, inf HK.  He underwent PCI with [name] with placement of a Promus DES to the mRCA x 2  . History of stroke     evidence of CVA on MRI in past  . Anemia   . History of alcohol abuse   . CVA (cerebral vascular accident) 11/28/2010  . Vision, loss, sudden 02/27/2011  . Gait instability 02/27/2011   Current Outpatient Prescriptions  Medication Sig Dispense Refill  . aspirin EC 81 MG tablet Take 1 tablet (81 mg total) by  mouth daily.      . carvedilol (COREG) 25 MG tablet Take 1 tablet (25 mg total) by mouth 2 (two) times daily with a meal.  60 tablet  6  . clopidogrel (PLAVIX) 75 MG tablet TAKE 1 TABLET BY MOUTH ONCE DAILY  30 tablet  3  . glipiZIDE (GLUCOTROL) 5 MG tablet Take 1 tablet (5 mg total) by mouth daily.  90 tablet  0  . hydrochlorothiazide (MICROZIDE) 12.5 MG capsule Take 2 capsules (25 mg total) by mouth daily.  60 capsule  6  . lisinopril (PRINIVIL,ZESTRIL) 20 MG tablet Take 1 tablet (20 mg total) by mouth 2 (two) times daily.  60 tablet  3  . metFORMIN (GLUCOPHAGE) 1000 MG tablet Take 1 tablet (1,000 mg total) by mouth 2 (two) times daily with a meal.  60 tablet  3  . nitroGLYCERIN (NITROSTAT) 0.4 MG SL tablet Place 0.4 mg under the tongue every 5 (five) minutes as needed.      Marland Kitchen oxyCODONE-acetaminophen (PERCOCET) 5-325 MG per tablet Take 1 tablet by mouth every 6 (six) hours as needed.        . pravastatin (PRAVACHOL) 40 MG tablet Take 1 tablet (40 mg total) by mouth daily.  30 tablet  6  . spironolactone (ALDACTONE) 25 MG tablet  Take 1 tablet (25 mg total) by mouth daily.  30 tablet  6   Family History  Problem Relation Age of Onset  . Heart attack Mother     Died 61  . Heart failure Mother   . Diabetes Mother   . Heart attack Father   . Heart failure Father   . Diabetes Father   . Heart disease Sister     s/p CABG x3 in her mid 57's  . Coronary artery disease Other     Premature   History   Social History  . Marital Status: Single    Spouse Name: N/A    Number of Children: 0  . Years of Education: N/A   Occupational History  . Stage hand    Social History Main Topics  . Smoking status: Never Smoker   . Smokeless tobacco: Never Used  . Alcohol Use: 15.6 oz/week    6 Cans of beer, 20 Shots of liquor per week     6-pack of beer a week/ half a fifth of liquor each week  . Drug Use: No  . Sexually Active: Yes   Other Topics Concern  . None   Social History Narrative    [place] with his cousinNo childrenExercises 3 times a week   Review of Systems:  General: no fevers, chills, no changes in body weight, no changes in appetite Skin: no rash HEENT: Decreased acuity in left eye, EOMI, pupils equal, pupils round, pupils reactive to light, no injection and anicteric.  Pulm: no dyspnea, coughing, wheezing CV: no chest pain, palpitations, shortness of breath Abd: no nausea/vomiting, abdominal pain, diarrhea/constipation GU: no dysuria, hematuria, polyuria Ext: no arthralgias, myalgias Neuro: weakness on the left side of body.   Objective:  Physical Exam: Filed Vitals:   08/14/11 1339  BP: 137/87  Pulse: 85  Temp: 97.8 F (36.6 C)  TempSrc: Oral  Height: 6' (1.829 m)  Weight: 201 lb 12.8 oz (91.536 kg)  SpO2: 100%   General: not in acute distress HEENT: PERRL, EOMI, no scleral icterus. Decreased visual acuity in both eyes, worse on the left side. Cardiac: S1/S2, RRR, No murmurs, gallops or rubs Pulm: Good air movement bilaterally, Clear to auscultation bilaterally, No rales, wheezing, rhonchi or rubs. Abd: Soft,  nondistended, nontender, no rebound pain, no organomegaly, BS present Ext: No rashes or edema, 2+DP/PT pulse bilaterally Neuro: alert and oriented X3, cranial nerves II-XII grossly intact.  left side paralysis. Muscle strength 3/5 in left lower extremety and 2/5 in left upper extremeity.   Assessment & Plan:   # Hypertension - his bp is well controlled. Bp is 137/87 at clinic. Will continue his current regimen.  # Diabetes mellitus type II - well controlled. His A1c is 5.8 on 05/29/11. Will continue his current regimen.  # HLD: LDL was 78 on 02/28/11. His AST and ALT was normal on 02/27/12. Will continue current pravastatin.  #. Anemia: patient's Hgb is 9.1 on 03/01/11. His anemia panel showed iron 42, TIBC 327, Ferritin 230, indicating non-iron deficient anemia. Vb12 level was 287 which is at low normal range. His methylmalonic  acid level was 0.39 normal. Patient drinks a little alcohol occasionally. The etiology for his anemia is not clear. Will pending his colonoscopy and check his CBC and Sed Rate. Will consider to give hematology referral if CBC is still anemic.    #HM: Patient will do colonoscopy on August 20, 2011.

## 2011-08-14 NOTE — Assessment & Plan Note (Signed)
He seems to be euvolemic.  At this point, no change in therapy is indicated.  We have reviewed salt and fluid restrictions.  No further cardiovascular testing is indicated.   

## 2011-08-14 NOTE — Patient Instructions (Signed)
The current medical regimen is effective;  continue present plan and medications.  Follow up in 1 year with Dr Hochrein.  You will receive a letter in the mail 2 months before you are due.  Please call us when you receive this letter to schedule your follow up appointment.  

## 2011-08-28 ENCOUNTER — Other Ambulatory Visit: Payer: Self-pay | Admitting: *Deleted

## 2011-08-28 DIAGNOSIS — E119 Type 2 diabetes mellitus without complications: Secondary | ICD-10-CM

## 2011-08-28 MED ORDER — GLIPIZIDE 5 MG PO TABS: 5.0000 mg | ORAL_TABLET | Freq: Every day | ORAL | Status: DC

## 2011-08-28 NOTE — Telephone Encounter (Signed)
Glucotrol refilled - rx request form faxed to [facility].

## 2011-08-29 ENCOUNTER — Ambulatory Visit (INDEPENDENT_AMBULATORY_CARE_PROVIDER_SITE_OTHER): Payer: Self-pay | Admitting: Gastroenterology

## 2011-08-29 ENCOUNTER — Telehealth: Payer: Self-pay | Admitting: *Deleted

## 2011-08-29 ENCOUNTER — Encounter: Payer: Self-pay | Admitting: Gastroenterology

## 2011-08-29 ENCOUNTER — Telehealth: Payer: Self-pay

## 2011-08-29 VITALS — BP 108/74 | HR 80 | Ht 72.0 in | Wt 203.2 lb

## 2011-08-29 DIAGNOSIS — D6959 Other secondary thrombocytopenia: Secondary | ICD-10-CM

## 2011-08-29 DIAGNOSIS — Z1211 Encounter for screening for malignant neoplasm of colon: Secondary | ICD-10-CM

## 2011-08-29 DIAGNOSIS — Z7902 Long term (current) use of antithrombotics/antiplatelets: Secondary | ICD-10-CM

## 2011-08-29 NOTE — Patient Instructions (Addendum)
We will get in touch with [name] about holding your plavix for 5 days prior to colonoscopy to decrease the risk of bleeding during the procedure.  Drug eluting stent placed last summer and this screening examination can wait a few months if [name] prefers.  You will be set up for a colonoscopy at proper time, holding plavix for 5 days if OK.

## 2011-08-29 NOTE — Telephone Encounter (Signed)
08/29/2011    RE: Grantley Stan DOB: [birth year] MRN: [id number]   Dear Dr Percival Spanish,    We have scheduled the above patient for an endoscopic procedure with Dr Ardis Hughs. Our records show that he is on anticoagulation therapy.   Please advise as to how long the patient may come off his therapy of Plavix  prior to the procedure.  Please fax back/ or route the completed form to Alyn Jurney  at [phone].   Sincerely,  [facility])   Please respond by 09/19/11 and route back to [facility])

## 2011-08-29 NOTE — Telephone Encounter (Signed)
Tried to call pt.  No answer, message left to call clinic.

## 2011-08-29 NOTE — Telephone Encounter (Signed)
Call from pt's family asking about the dose of Accupril they received from [facility].  Pt was given Accupril 10 mg daily. I have pt on lisinopril 20 mg bid Also [facility] has pt on Lipitor 20 mg daily and we have pravastatin 40 mg daily.  Please clarify

## 2011-08-29 NOTE — Telephone Encounter (Signed)
The meds were hand written by Dr Blaine Hamper on 06/07/11 but there was no appt nor refill request on that day so I am not certain what triggered the change. Please ask pt to stay on current meds - Lipitor and accupril 10 and F/U in May (needs appt) so we can see if BP was controlled. BP was controlled at March appt but I can only assume that it was while on accupril dose. I will ask Dr Blaine Hamper to correct his med list. Thanks

## 2011-08-29 NOTE — Progress Notes (Signed)
HPI: This is a   very pleasant 51 year old man who is here with his sister today.  Here for routine risk colon cancer screening.  Had DES stents placed last summer.    He has no significant GI complaints, specifically no bleeding, no constipation, no diarrhea, no abdominal complaints.  Review of systems: Pertinent positive and negative review of systems were noted in the above HPI section. Complete review of systems was performed and was otherwise normal.    Past Medical History  Diagnosis Date  . Diabetes mellitus   . Hypertension   . Macular hemorrhage   . Dyslipidemia   . Ischemic cardiomyopathy     echo 7/12: EF 40-45%, septal, apical, inf basal HK, mod LVH, mild MR, mild LAE  . Anemia   . CAD (coronary artery disease)     cath 8/12:  LM ok, mLAD occluded with trivial collats from right AM and dRCA, mDx (small) 80%, RI 25%, tiny branch of OM 90%, mAM 30-40%, RCA sub-totally occluded, then 80%, EF 40%, inf HK.  He underwent PCI with [name] with placement of a Promus DES to the mRCA x 2  . History of stroke     evidence of CVA on MRI in past  . History of alcohol abuse   . Vision, loss, sudden 02/27/2011  . Gait instability 02/27/2011  . Sleep apnea     Past Surgical History  Procedure Date  . Coronary angioplasty with stent placement   . Refractive surgery     right    Current Outpatient Prescriptions  Medication Sig Dispense Refill  . aspirin EC 81 MG tablet Take 1 tablet (81 mg total) by mouth daily.      . carvedilol (COREG) 25 MG tablet Take 1 tablet (25 mg total) by mouth 2 (two) times daily with a meal.  60 tablet  6  . clopidogrel (PLAVIX) 75 MG tablet TAKE 1 TABLET BY MOUTH ONCE DAILY  30 tablet  3  . glipiZIDE (GLUCOTROL) 5 MG tablet Take 1 tablet (5 mg total) by mouth daily.  90 tablet  1  . hydrochlorothiazide (MICROZIDE) 12.5 MG capsule Take 2 capsules (25 mg total) by mouth daily.  60 capsule  6  . lisinopril (PRINIVIL,ZESTRIL) 20 MG tablet Take 1  tablet (20 mg total) by mouth 2 (two) times daily.  60 tablet  3  . metFORMIN (GLUCOPHAGE) 1000 MG tablet Take 1 tablet (1,000 mg total) by mouth 2 (two) times daily with a meal.  60 tablet  3  . nitroGLYCERIN (NITROSTAT) 0.4 MG SL tablet Place 0.4 mg under the tongue every 5 (five) minutes as needed.      Marland Kitchen oxyCODONE-acetaminophen (PERCOCET) 5-325 MG per tablet Take 1 tablet by mouth every 6 (six) hours as needed.        . pravastatin (PRAVACHOL) 40 MG tablet Take 1 tablet (40 mg total) by mouth daily.  30 tablet  6  . spironolactone (ALDACTONE) 25 MG tablet Take 1 tablet (25 mg total) by mouth daily.  30 tablet  6    Allergies as of 08/29/2011 - Review Complete 08/29/2011  Allergen Reaction Noted  . Clonidine derivatives  12/15/2010    Family History  Problem Relation Age of Onset  . Adopted: Yes  . Heart attack Mother     Died 61  . Heart failure Mother   . Diabetes Mother   . Heart attack Father   . Heart failure Father   . Diabetes Father   .  Heart disease Sister     s/p CABG x3 in her mid 61's  . Prostate cancer Father   . Crohn's disease Sister   . Crohn's disease Other     niece  . Kidney disease Brother     History   Social History  . Marital Status: Single    Spouse Name: N/A    Number of Children: 0  . Years of Education: N/A   Occupational History  . disabled    Social History Main Topics  . Smoking status: Never Smoker   . Smokeless tobacco: Never Used  . Alcohol Use: 15.6 oz/week    6 Cans of beer, 20 Shots of liquor per week     6-pack of beer a week/ half a fifth of liquor each week  . Drug Use: No  . Sexually Active: Yes   Other Topics Concern  . Not on file   Social History Narrative   [place] in [place] with his cousinNo childrenExercises 3 times a week       Physical Exam: There were no vitals taken for this visit. Constitutional: generally well-appearing Psychiatric: alert and oriented x3 Eyes: extraocular movements  intact Mouth: oral pharynx moist, no lesions Neck: supple no lymphadenopathy Cardiovascular: heart regular rate and rhythm Lungs: clear to auscultation bilaterally Abdomen: soft, nontender, nondistended, no obvious ascites, no peritoneal signs, normal bowel sounds Extremities: no lower extremity edema bilaterally Skin: no lesions on visible extremities    Assessment and plan: 51 y.o. male with  average risk for colon cancer, elevated risk for complication during the procedure given his ongoing Plavix use.  We will communicate with his cardiologist about timing of his colonoscopy. There is certainly no need to perform this screening exam urgently. Usually it is felt to be safe to hold Plavix about a year after the drug-eluting stents were placed. That would mean a colonoscopy this summer. We will coordinate his care with him and with his cardiologist.

## 2011-08-30 ENCOUNTER — Other Ambulatory Visit: Payer: Self-pay | Admitting: Internal Medicine

## 2011-08-30 ENCOUNTER — Telehealth: Payer: Self-pay | Admitting: *Deleted

## 2011-08-30 DIAGNOSIS — I10 Essential (primary) hypertension: Secondary | ICD-10-CM

## 2011-08-30 DIAGNOSIS — E785 Hyperlipidemia, unspecified: Secondary | ICD-10-CM

## 2011-08-30 MED ORDER — QUINAPRIL HCL 10 MG PO TABS: 10.0000 mg | ORAL_TABLET | Freq: Every day | ORAL | Status: DC

## 2011-08-30 MED ORDER — ATORVASTATIN CALCIUM 20 MG PO TABS: 20.0000 mg | ORAL_TABLET | Freq: Every day | ORAL | Status: DC

## 2011-08-30 NOTE — Telephone Encounter (Signed)
Talked with pt per Edd Fabian - pt aware of appt with Dr Blaine Hamper 10/23/11 1:45PM and Dr Lynnae January suggest to stay with current meds till appt time. Pt repeated info back.Hilda Blades Deaunna Olarte RN 08/30/11 1:40PM

## 2011-09-01 ENCOUNTER — Other Ambulatory Visit: Payer: Self-pay | Admitting: Cardiology

## 2011-09-03 NOTE — Telephone Encounter (Signed)
..   Requested Prescriptions   Pending Prescriptions Disp Refills  . lisinopril (PRINIVIL,ZESTRIL) 20 MG tablet [Pharmacy Med Name: LISINOPRIL 20MG      TAB] 30 tablet 11    Sig: TAKE ONE TABLET BY MOUTH EVERY DAY  patient is on this med per 08/14/11 note

## 2011-09-04 NOTE — Telephone Encounter (Signed)
I would not suggest stopping the Plavix for an elective procedure prior to August of this year.

## 2011-09-05 NOTE — Telephone Encounter (Signed)
Please advise Dr Ardis Hughs

## 2011-09-06 NOTE — Telephone Encounter (Signed)
Thanks, [place].  Chong Sicilian, lets plan on colonoscopy in September this year, he should hold his plavix for 5 days prior.  Thanks

## 2011-09-10 NOTE — Telephone Encounter (Signed)
Recall in [facility] September 2013 colon

## 2011-09-26 ENCOUNTER — Telehealth: Payer: Self-pay | Admitting: Cardiology

## 2011-09-26 NOTE — Telephone Encounter (Signed)
New Problem:     Patient's niece called in wanting to know if it was ok for her uncle to hold one of his medications, she believes it was his blood thinner, for five days prior to his colonoscopy that has not been scheduled yet.  Please call back.

## 2011-09-26 NOTE — Telephone Encounter (Signed)
Pt on Plavix and ASA - would forward to Dr Percival Spanish for review

## 2011-10-03 NOTE — Telephone Encounter (Signed)
I do not want him to stop Plavix or ASA until August as it will have been one year since his stent at that time.

## 2011-10-04 NOTE — Telephone Encounter (Signed)
Left message that pt can not hold medications in question.  Requested they call back if further questions

## 2011-10-10 ENCOUNTER — Telehealth: Payer: Self-pay | Admitting: Cardiology

## 2011-10-10 ENCOUNTER — Encounter: Payer: Self-pay | Admitting: *Deleted

## 2011-10-10 NOTE — Progress Notes (Signed)
Pt returned call and he states he takes Lisinopril 20 mg twice a day, according to instructions from Dr Alba Cory.  He will run out of Lisinopril in 5 days and will start taking Accupril 10 mg one daily.   Pt checks his BP daily at home and last reading was 140/92.  Please clarify the instructions on the Accupril.

## 2011-10-10 NOTE — Telephone Encounter (Signed)
New problem:  Per Baker Janus, need to discuss medication, lisinopril, Accupril.

## 2011-10-10 NOTE — Progress Notes (Signed)
Tried to call pt at clarify what he is taking for BP med.  No answer but message left for him to call me at clinic.  Will add to this note as soon as I hear from pt.

## 2011-10-10 NOTE — Telephone Encounter (Signed)
Kevin Soto called no answer.

## 2011-10-10 NOTE — Telephone Encounter (Signed)
Spoke with Edd Fabian out [facility] regarding the Lisinopril and Accupril.  She was going to call patient and find out what medication patient was taking.  She had to leave a message.  Apparently the Accupril was changed by the [facility] office but a refill was ok'd by [facility] since the Lisinopril was still on med list.  Left message for Edd Fabian to call back if she is unable to work out issue.

## 2011-10-10 NOTE — Progress Notes (Signed)
Need clarification on medication as Dr Alba Cory has ordered lisinopril 20 mg 1 daily from [facility] on 09/01/11 And Dr Blaine Hamper has ordered Accupril 10 mg 1 daily from [facility].  Placed call to Dr [facility] office as patient can [facility] at [facility]

## 2011-10-11 ENCOUNTER — Telehealth: Payer: Self-pay | Admitting: *Deleted

## 2011-10-11 ENCOUNTER — Encounter: Payer: Self-pay | Admitting: Internal Medicine

## 2011-10-11 NOTE — Progress Notes (Signed)
   Thanks for  Kevin Soto's effort in making his HTN medication issue clear. He states he takes Lisinopril 20 mg twice a day now, according to instructions from Dr Alba Cory. He will run out of Lisinopril in 5 days and will start taking Accupril 10 mg one daily. Pt checks his BP daily at home and last reading was 140/92. This means that he will switch lisinopril to Accupril 10 mg daily on 10/16/11.    Ivor Costa, MD PGY1, Internal Medicine Teaching Service Pager: [phone]

## 2011-10-11 NOTE — Telephone Encounter (Signed)
Received call from Dr Edgar Frisk nurse re; Kevin Soto, pt is to stop lisinopril 20 mg BID when runs out then will start accupril 10 mg daily, pt will take bp daily and Dr Blaine Hamper will reassess BP 10/23/11, FYI

## 2011-10-11 NOTE — Progress Notes (Signed)
Dr Blaine Hamper is aware of dose of Lisinopril pt is taking and will see pt on 6/4 to evaluate. I also talked with  Dr. Vickki Muff nurse so he will be made aware of change and why.

## 2011-10-23 ENCOUNTER — Encounter: Payer: Self-pay | Admitting: Internal Medicine

## 2011-11-13 ENCOUNTER — Encounter: Payer: Self-pay | Admitting: Internal Medicine

## 2011-11-13 ENCOUNTER — Ambulatory Visit (INDEPENDENT_AMBULATORY_CARE_PROVIDER_SITE_OTHER): Payer: Self-pay | Admitting: Internal Medicine

## 2011-11-13 VITALS — BP 149/85 | HR 83 | Temp 99.0°F | Ht 72.0 in | Wt 201.5 lb

## 2011-11-13 DIAGNOSIS — E119 Type 2 diabetes mellitus without complications: Secondary | ICD-10-CM

## 2011-11-13 DIAGNOSIS — E785 Hyperlipidemia, unspecified: Secondary | ICD-10-CM

## 2011-11-13 DIAGNOSIS — Z79899 Other long term (current) drug therapy: Secondary | ICD-10-CM

## 2011-11-13 DIAGNOSIS — I10 Essential (primary) hypertension: Secondary | ICD-10-CM

## 2011-11-13 DIAGNOSIS — D649 Anemia, unspecified: Secondary | ICD-10-CM

## 2011-11-13 LAB — CBC
HCT: 30.7 % — ABNORMAL LOW (ref 39.0–52.0)
Hemoglobin: 9.8 g/dL — ABNORMAL LOW (ref 13.0–17.0)
MCV: 86.2 fL (ref 78.0–100.0)
RDW: 15.5 % (ref 11.5–15.5)
WBC: 10.8 10*3/uL — ABNORMAL HIGH (ref 4.0–10.5)

## 2011-11-13 LAB — BASIC METABOLIC PANEL WITH GFR
BUN: 26 mg/dL — ABNORMAL HIGH (ref 6–23)
CO2: 29 mEq/L (ref 19–32)
Chloride: 104 mEq/L (ref 96–112)
Creat: 1.37 mg/dL — ABNORMAL HIGH (ref 0.50–1.35)
GFR, Est Non African American: 58 mL/min — ABNORMAL LOW
Potassium: 4.3 mEq/L (ref 3.5–5.3)

## 2011-11-13 LAB — GLUCOSE, CAPILLARY: Glucose-Capillary: 146 mg/dL — ABNORMAL HIGH (ref 70–99)

## 2011-11-13 NOTE — Patient Instructions (Signed)
1. You have done great job in taking all your medications. I appreciate it very much. Please continue doing that. We will call GI to set up a colonoscopy for you. 2. Please take all medications as prescribed.  3. If you have worsening of your symptoms or new symptoms arise, please call the clinic [phone] or go to the ER immediately if symptoms are severe.

## 2011-11-13 NOTE — Progress Notes (Signed)
Subjective:   Patient ID: Kevin Soto male   DOB: 1961-02-06 52 y.o.   MRN: [id number]  HPI: [name] is a 50 y.o. man with PMH as outlined below who presented to the clinic for a regular follow up visit.   Patient reports that he has been taking all his medications regularly. He is doing PT on his own. He still has gait instability and feels weak on the left side of his body, particularly his left hand after his stroke in 11/2010 .    Regarding his coronary artery disease and ischemic cardiomyopathy, patient doesn't have chest pain, shortness of breath, PND or orthopnea. He saw his cardiologist in January. He was told that his cardiomyopathy is well controlled.  Regarding his macular hemorrhage, he had laser surgery to his right eye on Feb of 2013. He was seen by his ophthalmologist in last week. He still has poor vision, particularly on the left eye. There is no new symptoms to both eyes.  Denies fever, chills, headaches, cough, chest pain, SOB, abdominal pain,diarrhea, constipation, dysuria, urgency, frequency, hematuria, joint pain or leg swelling.    Past Medical History  Diagnosis Date  . Diabetes mellitus   . Hypertension   . Macular hemorrhage   . Dyslipidemia   . Ischemic cardiomyopathy     echo 7/12: EF 40-45%, septal, apical, inf basal HK, mod LVH, mild MR, mild LAE  . Anemia   . CAD (coronary artery disease)     cath 8/12:  LM ok, mLAD occluded with trivial collats from right AM and dRCA, mDx (small) 80%, RI 25%, tiny branch of OM 90%, mAM 30-40%, RCA sub-totally occluded, then 80%, EF 40%, inf HK.  He underwent PCI with Dr. Angelena Soto with placement of a Promus DES to the mRCA x 2  . History of stroke     evidence of CVA on MRI in past  . History of alcohol abuse   . Vision, loss, sudden 02/27/2011  . Gait instability 02/27/2011  . Sleep apnea    Current Outpatient Prescriptions  Medication Sig Dispense Refill  . aspirin EC 81 MG tablet Take 1 tablet (81 mg  total) by mouth daily.      Marland Kitchen atorvastatin (LIPITOR) 20 MG tablet Take 1 tablet (20 mg total) by mouth daily.  30 tablet  5  . clopidogrel (PLAVIX) 75 MG tablet TAKE 1 TABLET BY MOUTH ONCE DAILY  30 tablet  3  . glipiZIDE (GLUCOTROL) 5 MG tablet Take 1 tablet (5 mg total) by mouth daily.  90 tablet  1  . hydrochlorothiazide (MICROZIDE) 12.5 MG capsule Take 2 capsules (25 mg total) by mouth daily.  60 capsule  6  . metFORMIN (GLUCOPHAGE) 1000 MG tablet Take 1 tablet (1,000 mg total) by mouth 2 (two) times daily with a meal.  60 tablet  3  . nitroGLYCERIN (NITROSTAT) 0.4 MG SL tablet Place 0.4 mg under the tongue every 5 (five) minutes as needed.      . quinapril (ACCUPRIL) 10 MG tablet Take 1 tablet (10 mg total) by mouth daily.  30 tablet  5  . spironolactone (ALDACTONE) 25 MG tablet Take 1 tablet (25 mg total) by mouth daily.  30 tablet  6  . carvedilol (COREG) 25 MG tablet Take 1 tablet (25 mg total) by mouth 2 (two) times daily with a meal.  60 tablet  6   Family History  Problem Relation Age of Onset  . Adopted: Yes  . Heart attack Mother  Died 61  . Heart failure Mother   . Diabetes Mother   . Heart attack Father   . Heart failure Father   . Diabetes Father   . Heart disease Sister     s/p CABG x3 in her mid 7's  . Prostate cancer Father   . Crohn's disease Sister   . Crohn's disease Other     niece  . Kidney disease Brother    History   Social History  . Marital Status: Single    Spouse Name: N/A    Number of Children: 0  . Years of Education: N/A   Occupational History  . disabled    Social History Main Topics  . Smoking status: Never Smoker   . Smokeless tobacco: Never Used  . Alcohol Use: 15.6 oz/week    6 Cans of beer, 20 Shots of liquor per week     6-pack of beer a week/ half a fifth of liquor each week  . Drug Use: No  . Sexually Active: Yes   Other Topics Concern  . None   Social History Narrative   [place] in [place] with his cousinNo  childrenExercises 3 times a week   Review of Systems:  General: no fevers, chills, no changes in body weight, no changes in appetite Skin: no rash HEENT: Decreased acuity in left eye, EOMI, pupils equal, pupils round, pupils reactive to light, no injection and anicteric.  Pulm: no dyspnea, coughing, wheezing CV: no chest pain, palpitations, shortness of breath Abd: no nausea/vomiting, abdominal pain, diarrhea/constipation GU: no dysuria, hematuria, polyuria Ext: no arthralgias, myalgias Neuro: weakness on the left side of body.   Objective:  Physical Exam: Filed Vitals:   11/13/11 1404  BP: 149/85  Pulse: 83  Temp: 99 F (37.2 C)  TempSrc: Oral  Height: 6' (1.829 m)  Weight: 201 lb 8 oz (91.4 kg)  SpO2: 100%   General: not in acute distress HEENT: PERRL, EOMI, no scleral icterus. Decreased visual acuity in both eyes, worse on the left side. Cardiac: S1/S2, RRR, No murmurs, gallops or rubs Pulm: Good air movement bilaterally, Clear to auscultation bilaterally, No rales, wheezing, rhonchi or rubs. Abd: Soft,  nondistended, nontender, no rebound pain, no organomegaly, BS present Ext: No rashes or edema, 2+DP/PT pulse bilaterally Neuro: alert and oriented X3, cranial nerves II-XII grossly intact.  left side paralysis. Muscle strength 3/5 in left lower extremety and 2/5 in left upper extremeity.     Assessment & Plan:   # Hypertension - his bp is well controlled. Bp is 149/85 when he comes seen. Repeated blood pressure is 120/70. Will continue his current regimen. Will check BMP today.  # Diabetes mellitus type II - well controlled. His A1c is 5.87 today.  Will continue his current regimen.  # HLD: LDL was 78 on 02/28/11. His AST and ALT was normal on 02/27/12. Will continue current pravastatin.  #. Anemia: Etiology is not clear. Patient's Hgb was 9.1 on 03/01/11. His anemia panel showed iron 42, TIBC 327, Ferritin 230, indicating non-iron deficient anemia. Vb12 level was 287  which is at low normal range. His methylmalonic acid level was 0.39 normal. In previous visit, patient was given referral to GI for colonoscopy. But he was not called for schedule yet. Will call  GI again today to set up an appointment for colonoscopy. Will repeat CBC. If he is still anemic and has negative colonoscopy, will consider to give hematology referral.  #HM: Will call GI to set up  a colonoscopy scheduled.    Ivor Costa, MD PGY1, Internal Medicine Teaching Service Pager: [phone]

## 2011-11-23 ENCOUNTER — Other Ambulatory Visit: Payer: Self-pay | Admitting: Internal Medicine

## 2011-11-27 ENCOUNTER — Telehealth: Payer: Self-pay | Admitting: Gastroenterology

## 2011-11-27 NOTE — Telephone Encounter (Signed)
Kevin Soto has been notified pt is due for colon in Sept he will receive a letter in the mail

## 2011-12-10 ENCOUNTER — Other Ambulatory Visit: Payer: Self-pay | Admitting: Internal Medicine

## 2011-12-17 ENCOUNTER — Other Ambulatory Visit: Payer: Self-pay | Admitting: *Deleted

## 2011-12-17 DIAGNOSIS — E119 Type 2 diabetes mellitus without complications: Secondary | ICD-10-CM

## 2011-12-18 ENCOUNTER — Other Ambulatory Visit: Payer: Self-pay | Admitting: Internal Medicine

## 2011-12-18 DIAGNOSIS — E119 Type 2 diabetes mellitus without complications: Secondary | ICD-10-CM

## 2011-12-18 MED ORDER — METFORMIN HCL 1000 MG PO TABS: 1000.0000 mg | ORAL_TABLET | Freq: Two times a day (BID) | ORAL | Status: DC

## 2011-12-18 NOTE — Telephone Encounter (Signed)
Rx called in to pharmacy. 

## 2011-12-18 NOTE — Progress Notes (Signed)
This encounter was created in error - please disregard.

## 2011-12-18 NOTE — Addendum Note (Signed)
Addended by: Ivor Costa on: 12/18/2011 09:34 AM   Modules accepted: Miquel Dunn

## 2011-12-20 ENCOUNTER — Encounter (HOSPITAL_COMMUNITY)
Admission: RE | Admit: 2011-12-20 | Discharge: 2011-12-20 | Disposition: A | Discharge status: Home / Self Care | Payer: Medicaid Other | Source: Ambulatory Visit | Attending: Cardiology | Admitting: Cardiology

## 2011-12-20 ENCOUNTER — Encounter (HOSPITAL_COMMUNITY): Payer: Self-pay

## 2011-12-20 DIAGNOSIS — R079 Chest pain, unspecified: Secondary | ICD-10-CM | POA: Insufficient documentation

## 2011-12-20 DIAGNOSIS — I10 Essential (primary) hypertension: Secondary | ICD-10-CM | POA: Insufficient documentation

## 2011-12-20 DIAGNOSIS — Z5189 Encounter for other specified aftercare: Secondary | ICD-10-CM | POA: Insufficient documentation

## 2011-12-20 DIAGNOSIS — E785 Hyperlipidemia, unspecified: Secondary | ICD-10-CM | POA: Insufficient documentation

## 2011-12-20 DIAGNOSIS — E119 Type 2 diabetes mellitus without complications: Secondary | ICD-10-CM | POA: Insufficient documentation

## 2011-12-20 NOTE — Progress Notes (Signed)
Cardiac Rehab Medication Review by a Pharmacist  Does the patient  feel that his/her medications are working for him/her?  yes  Has the patient been experiencing any side effects to the medications prescribed?  yes  Does the patient measure his/her own blood pressure or blood glucose at home?  yes   Does the patient have any problems obtaining medications due to transportation or finances?   yes  Understanding of regimen: excellent Understanding of indications: good Potential of compliance: excellent    Pharmacist comments: Patient understands indications for his medications and reports 100% compliance   Bola A. [place], [place] Pharmacist Pager:[phone] Phone [phone] 12/20/2011 8:24 AM

## 2011-12-21 NOTE — Addendum Note (Signed)
Addended by: Truddie Crumble on: 12/21/2011 03:05 PM   Modules accepted: Orders

## 2011-12-24 ENCOUNTER — Encounter (HOSPITAL_COMMUNITY)
Admission: RE | Admit: 2011-12-24 | Discharge: 2011-12-24 | Disposition: A | Discharge status: Home / Self Care | Payer: Medicaid Other | Source: Ambulatory Visit | Attending: Cardiology | Admitting: Cardiology

## 2011-12-24 LAB — GLUCOSE, CAPILLARY
Glucose-Capillary: 128 mg/dL — ABNORMAL HIGH (ref 70–99)
Glucose-Capillary: 146 mg/dL — ABNORMAL HIGH (ref 70–99)

## 2011-12-24 NOTE — Progress Notes (Signed)
Pt started cardiac rehab today.  Pt tolerated light exercise without difficulty. Telemetry sinus rhythm. Initial blood pressure 88/58.  Patient given H20. Recheck blood pressure 104/60 sitting and 112/60 standing.  Due to visual impairment and history of CVA. Patient used a rolling walker and had a volunteer present during exercise. Will continue to monitor the patient throughout  the program. Will notify Dr Percival Spanish about low blood pressure.  Exit blood pressure  94/60 then 126/79 after H20.

## 2011-12-25 ENCOUNTER — Telehealth: Payer: Self-pay | Admitting: Cardiology

## 2011-12-25 NOTE — Telephone Encounter (Signed)
Information received via staff message to both myself and Dr Percival Spanish.  He will review and give orders as necessary.

## 2011-12-25 NOTE — Telephone Encounter (Signed)
WANTS TO TALK ABOUT THIS PATIENT

## 2011-12-26 ENCOUNTER — Encounter (HOSPITAL_COMMUNITY)
Admission: RE | Admit: 2011-12-26 | Discharge: 2011-12-26 | Disposition: A | Discharge status: Home / Self Care | Payer: Medicaid Other | Source: Ambulatory Visit | Attending: Cardiology | Admitting: Cardiology

## 2011-12-26 LAB — GLUCOSE, CAPILLARY: Glucose-Capillary: 85 mg/dL (ref 70–99)

## 2011-12-26 NOTE — Progress Notes (Signed)
Post exercise CBG 77.  Patient asymptomatic. Camrynn was given tang and a banana.  Repeat CBG 85.  Jodi left cardiac rehab without complaints. Trustyn plans to go home and have lunch.

## 2011-12-28 ENCOUNTER — Encounter (HOSPITAL_COMMUNITY)
Admission: RE | Admit: 2011-12-28 | Discharge: 2011-12-28 | Disposition: A | Discharge status: Home / Self Care | Payer: Medicaid Other | Source: Ambulatory Visit | Attending: Cardiology | Admitting: Cardiology

## 2011-12-28 LAB — GLUCOSE, CAPILLARY
Glucose-Capillary: 111 mg/dL — ABNORMAL HIGH (ref 70–99)
Glucose-Capillary: 98 mg/dL (ref 70–99)

## 2011-12-31 ENCOUNTER — Encounter (HOSPITAL_COMMUNITY)
Admission: RE | Admit: 2011-12-31 | Discharge: 2011-12-31 | Disposition: A | Discharge status: Home / Self Care | Payer: Medicaid Other | Source: Ambulatory Visit | Attending: Cardiology | Admitting: Cardiology

## 2011-12-31 ENCOUNTER — Telehealth: Payer: Self-pay | Admitting: Internal Medicine

## 2011-12-31 DIAGNOSIS — E119 Type 2 diabetes mellitus without complications: Secondary | ICD-10-CM

## 2011-12-31 LAB — GLUCOSE, CAPILLARY: Glucose-Capillary: 81 mg/dL (ref 70–99)

## 2011-12-31 MED ORDER — GLIPIZIDE ER 2.5 MG PO TB24: 2.5000 mg | ORAL_TABLET | Freq: Every day | ORAL | Status: DC

## 2011-12-31 NOTE — Telephone Encounter (Signed)
  Patient's CBG was too low for exercise in cardiac rehab. Pt reported eating a sufficient breakfast and CBG's were not meeting the goal of >110 mg/dL for pt's on Glipizide for exercise. Last A1c 5.7 11/13/11. I discussed with our Diabetic Educator, Debera Lat, who suggested to decrease his Glipizide dose from 5 mg to Glipizide 2.5 mg ER tablet daily. I will send a new prescription to his pharmacy (Glipizide 2.5 mg ER tablet daily)  Ivor Costa, MD PGY2, Internal Medicine Teaching Service Pager: [phone]

## 2011-12-31 NOTE — Progress Notes (Signed)
Nutrition Note Spoke with pt and pt's sister, [name]. Pt is blind in his left eye. Per discussion with pt and his sister, pt has strong family support. Pt reports working with Barry Brunner, RD, CDE over the past 2 years. Pt checks his CBG's after breakfast and after dinner. Per pt, CBG's used to be 120-130's in the morning about 1 hour after eating and now are consistently < 100 mg/dL. One hour after dinner CBG's reportedly around 120 mg/dL.  Pt having difficulty with CBG's being > 110 mg/dL before exercise despite eating a good breakfast (see 24 hour food recall below). CBG's > 110 mg/dL before exercise is required for pt's on Glipizide. Per discussion, exercise is new to pt's routine. Effects of exercise and Glipizide on CBG's discussed. Pt's last a1c 5.7, which indicates blood glucose tightly controlled. Pt denies excessive ETOH use. Pt reports he "may have a couple of shots and a beer once a week at the most."   Pt's 24 hour food recall: Breakfast Shredded wheat with 2% milk Banana, whole Egg Wwhite sandwich on 2 slices of dry whole wheat bread Coffee with splenda  Lunch Swanson[employer] or [employer] or "whatever is on sale" [place] pot pie Cranberry sauce Crystal light  Snack Grapes, banana, orange or pear  Baked chicken or grilled pork chop Salad Broccoli and carrots or green beans Mashed potatoes Crystal light  Bedtime snack Popcorn (air popped if his sisters make it or buttered popcorn if his nieces make it) OR vanilla ice cream or lime sherbet or a Nutrigrain bar   Nutrition Diagnosis   Food-and nutrition-related knowledge deficit related to lack of exposure to information as related to diagnosis of: ? CVD ? DM (A1c 5.7)   Nutrition RX/ Estimated Daily Nutrition Needs for: wt loss  1650-2150 Kcal, 45-55 gm fat, 10-14 gm sat fat, 1.6-2.1 gm trans-fat, <1500 mg sodium , 175-250 gm CHO   Nutrition Intervention   Pt to hold Glipizide and monitor pre-exercise CBG's   Will fax pt's  PCP if trial of holding Glipizide is successful.   Continue client-centered nutrition education by RD, as part of interdisciplinary care. Goal(s)   Pre-exercise CBG concentrations > 100 mg/dL while pt only taking Metformin. Monitor and Evaluate progress toward nutrition goal with team.

## 2011-12-31 NOTE — Telephone Encounter (Signed)
Message copied by Ivor Costa on Mon Dec 31, 2011  2:44 PM ------      Message from: Resa Miner      Created: Mon Dec 31, 2011  2:23 PM      Regarding: FW: CBG's too low for exercise       ? Is he ready to cut back to Glipizide 2.5 mg ER tablet daily (after cardiac rehab) per day?       Weight is going down... A1C 5.7%            If you agree can sen din Rx in phone note and ask triage or me to call him with new order. Thanks!            [name]            ----- Message -----         From: Jewel Baize, RD, LDN, CDE         Sent: 12/31/2011  11:01 AM           To: Chauncey Reading Plyler, RD, Ivor Costa, MD      Subject: CBG's too low for exercise                               Please see note from today re: CBG's being too low for exercise. Pt eating a sufficient breakfast and CBG's are not meeting the goal of >110 mg/dL for pt's on Glipizide. Last A1c 5.7 11/13/11. Pt is going to try holding Glipizide to see if pre-exercise CBG's improve. Will notify you of results. Please advise prn.      Thank you,      Derek Mound, M.Ed, RD, LDN, CDE

## 2011-12-31 NOTE — Progress Notes (Signed)
Pre exercise CBG 81. Kevin Soto ate breakfast and is being counseled by the dietitian. Patient given Tang and will not exercise today.

## 2012-01-01 NOTE — Telephone Encounter (Signed)
Spoke with patient's sister about new glipizide prescription. Sh verbalized comprehension to new dose and extended release rather than immediate release and for him to take it after exercise on cardiac rehab days. Kevin Soto

## 2012-01-02 ENCOUNTER — Encounter (HOSPITAL_COMMUNITY)
Admission: RE | Admit: 2012-01-02 | Discharge: 2012-01-02 | Disposition: A | Discharge status: Home / Self Care | Payer: Medicaid Other | Source: Ambulatory Visit | Attending: Cardiology | Admitting: Cardiology

## 2012-01-02 LAB — GLUCOSE, CAPILLARY
Glucose-Capillary: 140 mg/dL — ABNORMAL HIGH (ref 70–99)
Glucose-Capillary: 151 mg/dL — ABNORMAL HIGH (ref 70–99)

## 2012-01-02 NOTE — Progress Notes (Signed)
Reviewed home exercise with pt today.  Pt plans to walk in driveway and around the house for exercise.  Reviewed THR, pulse, RPE, sign and symptoms, NTG use, and when to call 911 or MD.  Pt voiced understanding. Alberteen Sam, MA, ACSM RCEP

## 2012-01-03 ENCOUNTER — Telehealth: Payer: Self-pay | Admitting: Dietician

## 2012-01-03 NOTE — Telephone Encounter (Signed)
Glipizide rx (written 8/12) called to [facility].  Pt (sister) was called; message left.

## 2012-01-03 NOTE — Telephone Encounter (Signed)
Patient's sister calls requesting new prescription for Glipizide 2.5 mg ER be sent to [facility] instead of [facility].. Will see is refill nurse can assist with transfering it to [facility].

## 2012-01-04 ENCOUNTER — Encounter (HOSPITAL_COMMUNITY)
Admission: RE | Admit: 2012-01-04 | Discharge: 2012-01-04 | Disposition: A | Discharge status: Home / Self Care | Payer: Medicaid Other | Source: Ambulatory Visit | Attending: Cardiology | Admitting: Cardiology

## 2012-01-04 LAB — GLUCOSE, CAPILLARY: Glucose-Capillary: 96 mg/dL (ref 70–99)

## 2012-01-04 NOTE — Progress Notes (Addendum)
Nutrition Note Spoke Dr. Blaine Hamper and Debera Lat, RD, CDE that pt works with for DM management contacted re: holding Glipizide. Dr. Blaine Hamper ordered 2.5 mg Glipizide ER and pt was contacted by the MD's office. Per discussion with pt, pt held Glipizide. Pt ate breakfast at 9 am, which consisted of 1 slice of whole wheat bread, sausage pattie, oatmeal, 1 cup of milk, and tea sweetened with regular sugar "because I was out of sugar substitute." Pt exercise starts at 9:45 am. Pre-exercise CBG 92 mg/dL, which is still below the desired >100 mg/dL for exercise on Metformin. Per pt, he [facility] DM [facility] and his medication will not be available to pick up until next week. Pt wants to continue to try holding Glipizide and monitor CBG's, which I think is appropriate given pt with a scheduled A1c 02/13/12. Will notify Dr. Blaine Hamper and Debera Lat. Continue client-centered nutrition education by RD as part of interdisciplinary care.  Monitor and evaluate progress toward nutrition goal with team.

## 2012-01-04 NOTE — Addendum Note (Signed)
Addended by: Ivor Costa on: 01/04/2012 11:59 AM   Modules accepted: Orders

## 2012-01-07 ENCOUNTER — Encounter (HOSPITAL_COMMUNITY)
Admission: RE | Admit: 2012-01-07 | Discharge: 2012-01-07 | Disposition: A | Discharge status: Home / Self Care | Payer: Medicaid Other | Source: Ambulatory Visit | Attending: Cardiology | Admitting: Cardiology

## 2012-01-07 NOTE — Progress Notes (Signed)
Reviewed Kevin Soto's quality of life scores with him. Talal scored low in all area's except family. Nerick denies being depressed. Will forward quality of life scores to Dr Percival Spanish.

## 2012-01-09 ENCOUNTER — Encounter (HOSPITAL_COMMUNITY)
Admission: RE | Admit: 2012-01-09 | Discharge: 2012-01-09 | Disposition: A | Discharge status: Home / Self Care | Payer: Medicaid Other | Source: Ambulatory Visit | Attending: Cardiology | Admitting: Cardiology

## 2012-01-09 LAB — GLUCOSE, CAPILLARY
Glucose-Capillary: 120 mg/dL — ABNORMAL HIGH (ref 70–99)
Glucose-Capillary: 119 mg/dL — ABNORMAL HIGH (ref 70–99)

## 2012-01-11 ENCOUNTER — Encounter (HOSPITAL_COMMUNITY)
Admission: RE | Admit: 2012-01-11 | Discharge: 2012-01-11 | Disposition: A | Discharge status: Home / Self Care | Payer: Medicaid Other | Source: Ambulatory Visit | Attending: Cardiology | Admitting: Cardiology

## 2012-01-11 LAB — GLUCOSE, CAPILLARY
Glucose-Capillary: 139 mg/dL — ABNORMAL HIGH (ref 70–99)
Glucose-Capillary: 105 mg/dL — ABNORMAL HIGH (ref 70–99)

## 2012-01-14 ENCOUNTER — Encounter (HOSPITAL_COMMUNITY)
Admission: RE | Admit: 2012-01-14 | Discharge: 2012-01-14 | Disposition: A | Discharge status: Home / Self Care | Payer: Medicaid Other | Source: Ambulatory Visit | Attending: Cardiology | Admitting: Cardiology

## 2012-01-14 LAB — GLUCOSE, CAPILLARY
Glucose-Capillary: 129 mg/dL — ABNORMAL HIGH (ref 70–99)
Glucose-Capillary: 137 mg/dL — ABNORMAL HIGH (ref 70–99)

## 2012-01-16 ENCOUNTER — Encounter (HOSPITAL_COMMUNITY)
Admission: RE | Admit: 2012-01-16 | Discharge: 2012-01-16 | Disposition: A | Discharge status: Home / Self Care | Payer: Medicaid Other | Source: Ambulatory Visit | Attending: Cardiology | Admitting: Cardiology

## 2012-01-16 LAB — GLUCOSE, CAPILLARY: Glucose-Capillary: 118 mg/dL — ABNORMAL HIGH (ref 70–99)

## 2012-01-18 ENCOUNTER — Encounter (HOSPITAL_COMMUNITY)
Admission: RE | Admit: 2012-01-18 | Discharge: 2012-01-18 | Disposition: A | Discharge status: Home / Self Care | Payer: Medicaid Other | Source: Ambulatory Visit | Attending: Cardiology | Admitting: Cardiology

## 2012-01-18 LAB — GLUCOSE, CAPILLARY
Glucose-Capillary: 120 mg/dL — ABNORMAL HIGH (ref 70–99)
Glucose-Capillary: 122 mg/dL — ABNORMAL HIGH (ref 70–99)

## 2012-01-23 ENCOUNTER — Encounter (HOSPITAL_COMMUNITY)
Admission: RE | Admit: 2012-01-23 | Discharge: 2012-01-23 | Disposition: A | Discharge status: Home / Self Care | Payer: Medicaid Other | Source: Ambulatory Visit | Attending: Cardiology | Admitting: Cardiology

## 2012-01-23 DIAGNOSIS — I10 Essential (primary) hypertension: Secondary | ICD-10-CM | POA: Insufficient documentation

## 2012-01-23 DIAGNOSIS — E785 Hyperlipidemia, unspecified: Secondary | ICD-10-CM | POA: Insufficient documentation

## 2012-01-23 DIAGNOSIS — R079 Chest pain, unspecified: Secondary | ICD-10-CM | POA: Insufficient documentation

## 2012-01-23 DIAGNOSIS — E119 Type 2 diabetes mellitus without complications: Secondary | ICD-10-CM | POA: Insufficient documentation

## 2012-01-23 DIAGNOSIS — Z5189 Encounter for other specified aftercare: Secondary | ICD-10-CM | POA: Insufficient documentation

## 2012-01-23 LAB — GLUCOSE, CAPILLARY
Glucose-Capillary: 131 mg/dL — ABNORMAL HIGH (ref 70–99)
Glucose-Capillary: 106 mg/dL — ABNORMAL HIGH (ref 70–99)

## 2012-01-25 ENCOUNTER — Encounter (HOSPITAL_COMMUNITY)
Admission: RE | Admit: 2012-01-25 | Discharge: 2012-01-25 | Disposition: A | Discharge status: Home / Self Care | Payer: Medicaid Other | Source: Ambulatory Visit | Attending: Cardiology | Admitting: Cardiology

## 2012-01-25 LAB — GLUCOSE, CAPILLARY: Glucose-Capillary: 153 mg/dL — ABNORMAL HIGH (ref 70–99)

## 2012-01-25 NOTE — Progress Notes (Signed)
Kevin Soto 51 y.o. male Nutrition Note Spoke with pt.  Nutrition Plan, Nutrition Survey, and cholesterol goals reviewed with pt. Pt is following Step 2 of the Therapeutic Lifestyle Changes diet. Pt wants to lose wt. Pt wt is down 6# over the past 5 months. Wt loss tips reviewed.  Pt is diabetic. Last A1c indicates blood glucose well-controlled. Pt's pre-exercise CBG's now improved and WNL since pt off of Glipizide. Pre-exercise CBG's 120-153 mg/dL and post-exercise CBG's 105-142 mg/dL. 100% of Pre-exercise CBG's within desired limits and 4 out of 6 post-exercise CBG's WNL. This Probation officer went over Diabetes Education test results. Pt expressed understanding of the above information reviewed. Pt has been working with Debera Lat, RD, LDN, CDE re: DM management.  Nutrition Diagnosis   Food-and nutrition-related knowledge deficit related to lack of exposure to information as related to diagnosis of: ? CVD ? DM (A1c 5.7)    Overweight related to excessive energy intake as evidenced by a BMI of 28.1  Nutrition RX/ Estimated Daily Nutrition Needs for: wt loss  1650-2150 Kcal, 45-55 gm fat, 10-14 gm sat fat, 1.6-2.1 gm trans-fat, <1500 mg sodium , 175-250 gm CHO   Nutrition Intervention   Pt's individual nutrition plan including cholesterol goals reviewed with pt.   Benefits of adopting Therapeutic Lifestyle Changes discussed when Medficts reviewed.   Pt to attend the Portion Distortion class   Pt to attend the  ? Nutrition I class                      ? Nutrition II class     ? Diabetes Blitz class    ? Diabetes Q & A class   Continue client-centered nutrition education by RD, as part of interdisciplinary care. Goal(s)   Pt to identify food quantities necessary to achieve: ? wt loss to a goal wt of 172-184 lb (78.6-84.0 kg) at graduation from cardiac rehab.    Pt CBG concentrations > 100 mg/dL for exercise while pt only taking Metformin for DM. - met; resolve Monitor and Evaluate progress toward  nutrition goal with team. Nutrition Risk: Change to Moderate

## 2012-01-28 ENCOUNTER — Encounter (HOSPITAL_COMMUNITY)
Admission: RE | Admit: 2012-01-28 | Discharge: 2012-01-28 | Disposition: A | Discharge status: Home / Self Care | Payer: Medicaid Other | Source: Ambulatory Visit | Attending: Cardiology | Admitting: Cardiology

## 2012-01-28 LAB — GLUCOSE, CAPILLARY: Glucose-Capillary: 172 mg/dL — ABNORMAL HIGH (ref 70–99)

## 2012-01-30 ENCOUNTER — Encounter (HOSPITAL_COMMUNITY)
Admission: RE | Admit: 2012-01-30 | Discharge: 2012-01-30 | Disposition: A | Discharge status: Home / Self Care | Payer: Medicaid Other | Source: Ambulatory Visit | Attending: Cardiology | Admitting: Cardiology

## 2012-02-01 ENCOUNTER — Encounter (HOSPITAL_COMMUNITY)
Admission: RE | Admit: 2012-02-01 | Discharge: 2012-02-01 | Disposition: A | Discharge status: Home / Self Care | Payer: Medicaid Other | Source: Ambulatory Visit | Attending: Cardiology | Admitting: Cardiology

## 2012-02-04 ENCOUNTER — Encounter (HOSPITAL_COMMUNITY)
Admission: RE | Admit: 2012-02-04 | Discharge: 2012-02-04 | Disposition: A | Discharge status: Home / Self Care | Payer: Medicaid Other | Source: Ambulatory Visit | Attending: Cardiology | Admitting: Cardiology

## 2012-02-05 ENCOUNTER — Other Ambulatory Visit: Payer: Self-pay | Admitting: Cardiology

## 2012-02-05 ENCOUNTER — Other Ambulatory Visit: Payer: Self-pay | Admitting: *Deleted

## 2012-02-05 ENCOUNTER — Other Ambulatory Visit: Payer: Self-pay | Admitting: Internal Medicine

## 2012-02-05 DIAGNOSIS — R079 Chest pain, unspecified: Secondary | ICD-10-CM

## 2012-02-05 MED ORDER — NITROGLYCERIN 0.4 MG SL SUBL: 0.4000 mg | SUBLINGUAL_TABLET | SUBLINGUAL | Status: DC | PRN

## 2012-02-05 NOTE — Telephone Encounter (Signed)
..   Requested Prescriptions   Pending Prescriptions Disp Refills  . carvedilol (COREG) 25 MG tablet [Pharmacy Med Name: CARVEDILOL 25MG      TAB] 60 tablet 5    Sig: TAKE ONE TABLET BY MOUTH TWICE DAILY WITH A MEAL

## 2012-02-06 ENCOUNTER — Encounter (HOSPITAL_COMMUNITY)
Admission: RE | Admit: 2012-02-06 | Discharge: 2012-02-06 | Disposition: A | Discharge status: Home / Self Care | Payer: Medicaid Other | Source: Ambulatory Visit | Attending: Cardiology | Admitting: Cardiology

## 2012-02-06 NOTE — Telephone Encounter (Signed)
Called to pharm 

## 2012-02-08 ENCOUNTER — Encounter (HOSPITAL_COMMUNITY)
Admission: RE | Admit: 2012-02-08 | Discharge: 2012-02-08 | Disposition: A | Discharge status: Home / Self Care | Payer: Medicaid Other | Source: Ambulatory Visit | Attending: Cardiology | Admitting: Cardiology

## 2012-02-08 LAB — GLUCOSE, CAPILLARY: Glucose-Capillary: 113 mg/dL — ABNORMAL HIGH (ref 70–99)

## 2012-02-11 ENCOUNTER — Encounter (HOSPITAL_COMMUNITY)
Admission: RE | Admit: 2012-02-11 | Discharge: 2012-02-11 | Disposition: A | Discharge status: Home / Self Care | Payer: Medicaid Other | Source: Ambulatory Visit | Attending: Cardiology | Admitting: Cardiology

## 2012-02-11 LAB — GLUCOSE, CAPILLARY: Glucose-Capillary: 129 mg/dL — ABNORMAL HIGH (ref 70–99)

## 2012-02-13 ENCOUNTER — Encounter (HOSPITAL_COMMUNITY)
Admission: RE | Admit: 2012-02-13 | Discharge: 2012-02-13 | Disposition: A | Discharge status: Home / Self Care | Payer: Medicaid Other | Source: Ambulatory Visit | Attending: Cardiology | Admitting: Cardiology

## 2012-02-13 LAB — GLUCOSE, CAPILLARY: Glucose-Capillary: 144 mg/dL — ABNORMAL HIGH (ref 70–99)

## 2012-02-14 ENCOUNTER — Other Ambulatory Visit: Payer: Self-pay | Admitting: *Deleted

## 2012-02-14 DIAGNOSIS — E785 Hyperlipidemia, unspecified: Secondary | ICD-10-CM

## 2012-02-14 DIAGNOSIS — I10 Essential (primary) hypertension: Secondary | ICD-10-CM

## 2012-02-14 MED ORDER — QUINAPRIL HCL 10 MG PO TABS: 10.0000 mg | ORAL_TABLET | Freq: Every day | ORAL | Status: DC

## 2012-02-14 MED ORDER — ATORVASTATIN CALCIUM 20 MG PO TABS: 20.0000 mg | ORAL_TABLET | Freq: Every day | ORAL | Status: DC

## 2012-02-15 ENCOUNTER — Encounter (HOSPITAL_COMMUNITY)
Admission: RE | Admit: 2012-02-15 | Discharge: 2012-02-15 | Disposition: A | Discharge status: Home / Self Care | Payer: Medicaid Other | Source: Ambulatory Visit | Attending: Cardiology | Admitting: Cardiology

## 2012-02-15 LAB — GLUCOSE, CAPILLARY: Glucose-Capillary: 186 mg/dL — ABNORMAL HIGH (ref 70–99)

## 2012-02-15 NOTE — Telephone Encounter (Signed)
rx faxed in 

## 2012-02-18 ENCOUNTER — Encounter (HOSPITAL_COMMUNITY)
Admission: RE | Admit: 2012-02-18 | Discharge: 2012-02-18 | Disposition: A | Discharge status: Home / Self Care | Payer: Medicaid Other | Source: Ambulatory Visit | Attending: Cardiology | Admitting: Cardiology

## 2012-02-18 LAB — GLUCOSE, CAPILLARY: Glucose-Capillary: 138 mg/dL — ABNORMAL HIGH (ref 70–99)

## 2012-02-20 ENCOUNTER — Encounter (HOSPITAL_COMMUNITY)
Admission: RE | Admit: 2012-02-20 | Discharge: 2012-02-20 | Disposition: A | Discharge status: Home / Self Care | Payer: Medicaid Other | Source: Ambulatory Visit | Attending: Cardiology | Admitting: Cardiology

## 2012-02-20 DIAGNOSIS — Z5189 Encounter for other specified aftercare: Secondary | ICD-10-CM | POA: Insufficient documentation

## 2012-02-20 DIAGNOSIS — R079 Chest pain, unspecified: Secondary | ICD-10-CM | POA: Insufficient documentation

## 2012-02-20 DIAGNOSIS — E785 Hyperlipidemia, unspecified: Secondary | ICD-10-CM | POA: Insufficient documentation

## 2012-02-20 DIAGNOSIS — I10 Essential (primary) hypertension: Secondary | ICD-10-CM | POA: Insufficient documentation

## 2012-02-20 DIAGNOSIS — E119 Type 2 diabetes mellitus without complications: Secondary | ICD-10-CM | POA: Insufficient documentation

## 2012-02-20 LAB — GLUCOSE, CAPILLARY: Glucose-Capillary: 188 mg/dL — ABNORMAL HIGH (ref 70–99)

## 2012-02-22 ENCOUNTER — Encounter (HOSPITAL_COMMUNITY)
Admission: RE | Admit: 2012-02-22 | Discharge: 2012-02-22 | Disposition: A | Discharge status: Home / Self Care | Payer: Medicaid Other | Source: Ambulatory Visit | Attending: Cardiology | Admitting: Cardiology

## 2012-02-22 LAB — GLUCOSE, CAPILLARY: Glucose-Capillary: 181 mg/dL — ABNORMAL HIGH (ref 70–99)

## 2012-02-25 ENCOUNTER — Encounter (HOSPITAL_COMMUNITY)
Admission: RE | Admit: 2012-02-25 | Discharge: 2012-02-25 | Disposition: A | Discharge status: Home / Self Care | Payer: Medicaid Other | Source: Ambulatory Visit | Attending: Cardiology | Admitting: Cardiology

## 2012-02-25 LAB — GLUCOSE, CAPILLARY: Glucose-Capillary: 147 mg/dL — ABNORMAL HIGH (ref 70–99)

## 2012-02-26 ENCOUNTER — Encounter: Payer: Self-pay | Admitting: Gastroenterology

## 2012-02-27 ENCOUNTER — Encounter (HOSPITAL_COMMUNITY)
Admission: RE | Admit: 2012-02-27 | Discharge: 2012-02-27 | Disposition: A | Discharge status: Home / Self Care | Payer: Medicaid Other | Source: Ambulatory Visit | Attending: Cardiology | Admitting: Cardiology

## 2012-02-29 ENCOUNTER — Encounter (HOSPITAL_COMMUNITY)
Admission: RE | Admit: 2012-02-29 | Discharge: 2012-02-29 | Disposition: A | Discharge status: Home / Self Care | Payer: Medicaid Other | Source: Ambulatory Visit | Attending: Cardiology | Admitting: Cardiology

## 2012-03-03 ENCOUNTER — Encounter (HOSPITAL_COMMUNITY)
Admission: RE | Admit: 2012-03-03 | Discharge: 2012-03-03 | Disposition: A | Discharge status: Home / Self Care | Payer: Medicaid Other | Source: Ambulatory Visit | Attending: Cardiology | Admitting: Cardiology

## 2012-03-05 ENCOUNTER — Encounter (HOSPITAL_COMMUNITY)
Admission: RE | Admit: 2012-03-05 | Discharge: 2012-03-05 | Disposition: A | Discharge status: Home / Self Care | Payer: Medicaid Other | Source: Ambulatory Visit | Attending: Cardiology | Admitting: Cardiology

## 2012-03-07 ENCOUNTER — Encounter (HOSPITAL_COMMUNITY)
Admission: RE | Admit: 2012-03-07 | Discharge: 2012-03-07 | Disposition: A | Discharge status: Home / Self Care | Payer: Medicaid Other | Source: Ambulatory Visit | Attending: Cardiology | Admitting: Cardiology

## 2012-03-10 ENCOUNTER — Encounter (HOSPITAL_COMMUNITY)
Admission: RE | Admit: 2012-03-10 | Discharge: 2012-03-10 | Disposition: A | Discharge status: Home / Self Care | Payer: Medicaid Other | Source: Ambulatory Visit | Attending: Cardiology | Admitting: Cardiology

## 2012-03-10 LAB — GLUCOSE, CAPILLARY: Glucose-Capillary: 165 mg/dL — ABNORMAL HIGH (ref 70–99)

## 2012-03-12 ENCOUNTER — Encounter (HOSPITAL_COMMUNITY)
Admission: RE | Admit: 2012-03-12 | Discharge: 2012-03-12 | Disposition: A | Discharge status: Home / Self Care | Payer: Medicaid Other | Source: Ambulatory Visit | Attending: Cardiology | Admitting: Cardiology

## 2012-03-12 LAB — GLUCOSE, CAPILLARY: Glucose-Capillary: 173 mg/dL — ABNORMAL HIGH (ref 70–99)

## 2012-03-14 ENCOUNTER — Encounter (HOSPITAL_COMMUNITY)
Admission: RE | Admit: 2012-03-14 | Discharge: 2012-03-14 | Disposition: A | Discharge status: Home / Self Care | Payer: Medicaid Other | Source: Ambulatory Visit | Attending: Cardiology | Admitting: Cardiology

## 2012-03-14 LAB — GLUCOSE, CAPILLARY: Glucose-Capillary: 129 mg/dL — ABNORMAL HIGH (ref 70–99)

## 2012-03-17 ENCOUNTER — Encounter (HOSPITAL_COMMUNITY)
Admission: RE | Admit: 2012-03-17 | Discharge: 2012-03-17 | Disposition: A | Discharge status: Home / Self Care | Payer: Medicaid Other | Source: Ambulatory Visit | Attending: Cardiology | Admitting: Cardiology

## 2012-03-17 LAB — GLUCOSE, CAPILLARY: Glucose-Capillary: 133 mg/dL — ABNORMAL HIGH (ref 70–99)

## 2012-03-19 ENCOUNTER — Encounter (HOSPITAL_COMMUNITY)
Admission: RE | Admit: 2012-03-19 | Discharge: 2012-03-19 | Disposition: A | Discharge status: Home / Self Care | Payer: Medicaid Other | Source: Ambulatory Visit | Attending: Cardiology | Admitting: Cardiology

## 2012-03-19 LAB — GLUCOSE, CAPILLARY: Glucose-Capillary: 154 mg/dL — ABNORMAL HIGH (ref 70–99)

## 2012-03-21 ENCOUNTER — Encounter (HOSPITAL_COMMUNITY): Payer: Medicaid Other

## 2012-03-24 ENCOUNTER — Encounter (HOSPITAL_COMMUNITY): Payer: Medicaid Other

## 2012-03-26 ENCOUNTER — Encounter (HOSPITAL_COMMUNITY): Payer: Medicaid Other

## 2012-03-28 ENCOUNTER — Encounter (HOSPITAL_COMMUNITY): Payer: Medicaid Other

## 2012-04-01 ENCOUNTER — Ambulatory Visit (INDEPENDENT_AMBULATORY_CARE_PROVIDER_SITE_OTHER): Payer: Medicaid Other | Admitting: *Deleted

## 2012-04-01 DIAGNOSIS — Z23 Encounter for immunization: Secondary | ICD-10-CM

## 2012-04-01 DIAGNOSIS — Z299 Encounter for prophylactic measures, unspecified: Secondary | ICD-10-CM

## 2012-04-07 ENCOUNTER — Other Ambulatory Visit: Payer: Self-pay | Admitting: Internal Medicine

## 2012-04-07 DIAGNOSIS — I10 Essential (primary) hypertension: Secondary | ICD-10-CM

## 2012-04-21 ENCOUNTER — Other Ambulatory Visit: Payer: Self-pay | Admitting: Internal Medicine

## 2012-04-23 ENCOUNTER — Other Ambulatory Visit: Payer: Self-pay | Admitting: Internal Medicine

## 2012-05-26 ENCOUNTER — Other Ambulatory Visit: Payer: Self-pay | Admitting: *Deleted

## 2012-05-26 DIAGNOSIS — R079 Chest pain, unspecified: Secondary | ICD-10-CM

## 2012-05-26 MED ORDER — NITROGLYCERIN 0.4 MG SL SUBL: 0.4000 mg | SUBLINGUAL_TABLET | SUBLINGUAL | Status: DC | PRN

## 2012-05-27 NOTE — Telephone Encounter (Signed)
Rx called in to pharmacy. 

## 2012-05-29 ENCOUNTER — Encounter: Payer: Self-pay | Admitting: Internal Medicine

## 2012-05-30 ENCOUNTER — Other Ambulatory Visit: Payer: Self-pay | Admitting: *Deleted

## 2012-05-30 DIAGNOSIS — I10 Essential (primary) hypertension: Secondary | ICD-10-CM

## 2012-05-30 MED ORDER — SPIRONOLACTONE 25 MG PO TABS: 25.0000 mg | ORAL_TABLET | Freq: Every day | ORAL | Status: DC

## 2012-05-30 NOTE — Telephone Encounter (Signed)
Rx faxed in.

## 2012-06-03 ENCOUNTER — Encounter: Payer: Self-pay | Admitting: Gastroenterology

## 2012-06-13 ENCOUNTER — Ambulatory Visit (AMBULATORY_SURGERY_CENTER): Payer: Medicaid Other | Admitting: *Deleted

## 2012-06-13 VITALS — Ht 72.0 in | Wt 198.0 lb

## 2012-06-13 DIAGNOSIS — Z1211 Encounter for screening for malignant neoplasm of colon: Secondary | ICD-10-CM

## 2012-06-13 MED ORDER — MOVIPREP 100 G PO SOLR: ORAL | Status: DC

## 2012-06-13 NOTE — Progress Notes (Signed)
I corrected Mr Kittler's colon instructions to read Hold your plavix 5 days prior to colonoscopy starting 06/20/2012.per 09/06/2011 note.

## 2012-06-20 ENCOUNTER — Other Ambulatory Visit: Payer: Self-pay | Admitting: Internal Medicine

## 2012-06-20 ENCOUNTER — Encounter: Payer: Self-pay | Admitting: Internal Medicine

## 2012-06-20 ENCOUNTER — Telehealth: Payer: Self-pay | Admitting: *Deleted

## 2012-06-20 NOTE — Telephone Encounter (Signed)
Pharmacy can no longer get accupril, will you consider diovan, please advise

## 2012-06-20 NOTE — Telephone Encounter (Signed)
Lisinopril would be better choice.  Ivor Costa, MD PGY2, Internal Medicine Teaching Service Pager: [phone]

## 2012-06-24 ENCOUNTER — Telehealth: Payer: Self-pay

## 2012-06-24 ENCOUNTER — Ambulatory Visit (INDEPENDENT_AMBULATORY_CARE_PROVIDER_SITE_OTHER): Payer: Medicaid Other | Admitting: Internal Medicine

## 2012-06-24 ENCOUNTER — Encounter: Payer: Self-pay | Admitting: Internal Medicine

## 2012-06-24 ENCOUNTER — Telehealth: Payer: Self-pay | Admitting: Gastroenterology

## 2012-06-24 VITALS — BP 138/80 | HR 80 | Temp 97.8°F | Ht 72.0 in | Wt 204.0 lb

## 2012-06-24 DIAGNOSIS — I420 Dilated cardiomyopathy: Secondary | ICD-10-CM

## 2012-06-24 DIAGNOSIS — E1149 Type 2 diabetes mellitus with other diabetic neurological complication: Secondary | ICD-10-CM

## 2012-06-24 DIAGNOSIS — I10 Essential (primary) hypertension: Secondary | ICD-10-CM

## 2012-06-24 DIAGNOSIS — D649 Anemia, unspecified: Secondary | ICD-10-CM

## 2012-06-24 DIAGNOSIS — E785 Hyperlipidemia, unspecified: Secondary | ICD-10-CM

## 2012-06-24 DIAGNOSIS — I428 Other cardiomyopathies: Secondary | ICD-10-CM

## 2012-06-24 DIAGNOSIS — E1142 Type 2 diabetes mellitus with diabetic polyneuropathy: Secondary | ICD-10-CM

## 2012-06-24 DIAGNOSIS — Z79899 Other long term (current) drug therapy: Secondary | ICD-10-CM

## 2012-06-24 DIAGNOSIS — Z0000 Encounter for general adult medical examination without abnormal findings: Secondary | ICD-10-CM

## 2012-06-24 LAB — CBC WITH DIFFERENTIAL/PLATELET
Basophils Absolute: 0 10*3/uL (ref 0.0–0.1)
Lymphocytes Relative: 25 % (ref 12–46)
Neutro Abs: 5.4 10*3/uL (ref 1.7–7.7)
Platelets: 355 10*3/uL (ref 150–400)
RBC: 3.31 MIL/uL — ABNORMAL LOW (ref 4.22–5.81)
RDW: 15.4 % (ref 11.5–15.5)
WBC: 9.1 10*3/uL (ref 4.0–10.5)

## 2012-06-24 LAB — LIPID PANEL
Cholesterol: 157 mg/dL (ref 0–200)
HDL: 52 mg/dL (ref >39)
Triglycerides: 79 mg/dL (ref <150)

## 2012-06-24 LAB — POCT GLYCOSYLATED HEMOGLOBIN (HGB A1C): Hemoglobin A1C: 6.1

## 2012-06-24 LAB — GLUCOSE, CAPILLARY: Glucose-Capillary: 154 mg/dL — ABNORMAL HIGH (ref 70–99)

## 2012-06-24 MED ORDER — PEG-KCL-NACL-NASULF-NA ASC-C 100 G PO SOLR: 1.0000 | Freq: Once | ORAL | Status: DC

## 2012-06-24 NOTE — Assessment & Plan Note (Addendum)
Etiology is not clear. Patient's Hgb was 9.1 on 03/01/11. His anemia panel showed iron 42, TIBC 327, Ferritin 230, indicating non-iron deficient anemia. Vb12 level was 287 which is at low normal range. His methylmalonic acid level was 0.39 normal. It is important to rule out GI blood loss. He is going to do colonoscopy tomorrow 06/25/12.  - Will repeat CBC and anemia panel today - If he is still anemic and has negative colonoscopy, will consider to give hematology referral.

## 2012-06-24 NOTE — Telephone Encounter (Signed)
Left voucher for free Movi prep at desk.  Pt's niece plans to pick it up before office closes today.

## 2012-06-24 NOTE — Assessment & Plan Note (Signed)
Patient's previous LDL was 78 on 02/28/11. He is currently taking Lipitor 20 mg daily. He did not notice any side effects, such as muscle pain. His AST and ALT were normal on 02/27/11. Will continue current regimen and check lipid profile today.

## 2012-06-24 NOTE — Assessment & Plan Note (Signed)
Patient has been followed up by cardiologist, last seen was 08/14/11. Clinically patient is doing well. He does not have chest pain, shortness of breath, palpitation, leg edema. Will continue current regimen.

## 2012-06-24 NOTE — Progress Notes (Addendum)
Patient ID: Kevin Soto, male   DOB: 1960/09/12, 52 y.o.   MRN: [id number]  Subjective:   Patient ID: Kevin Soto male   DOB: Nov 29, 1960 52 y.o.   MRN: [id number]  HPI: Kevin Soto is a 52 y.o. man with PMH as outlined below who presented to the clinic for a regular follow up visit.   Patient reports that he has been taking all his medications regularly. He still has some residual gait instability and weakness  on the left side of his body after his stroke in 11/2010 .    1.) CAD and  ischemic cardiomyopathy:  patient doesn't have chest pain, shortness of breath leg edema, PND or orthopnea.   2.)  Macular hemorrhage:  He had laser surgery to his right eye on Feb of 2013. He was seen by his ophthalmologist in last week. He still has poor vision, particularly on the left eye. There is no new symptoms to both eyes. He has been followed up by his ophthalmologist. He will have an appointment on July 02, 2012.    3.) DM-II: She is currently taking metformin 1000 mg twice a day. His A1c is 6.1 today. He does not have symptoms of either hyper or hypoglycemia.   4.) HTN: Patient is on Microzide 12.5 mg daily and Accupril 10 mg daily. His blood pressure was 168/93 pain he came in, repeated blood pressure is 138/80 mmHg.   Denies fever, chills, headaches, cough, chest pain, SOB, abdominal pain,diarrhea, constipation, dysuria, urgency, frequency, hematuria, joint pain or leg swelling.    Past Medical History  Diagnosis Date  . Diabetes mellitus   . Hypertension   . Macular hemorrhage   . Dyslipidemia   . Ischemic cardiomyopathy     echo 7/12: EF 40-45%, septal, apical, inf basal HK, mod LVH, mild MR, mild LAE  . Anemia   . CAD (coronary artery disease)     cath 8/12:  LM ok, mLAD occluded with trivial collats from right AM and dRCA, mDx (small) 80%, RI 25%, tiny branch of OM 90%, mAM 30-40%, RCA sub-totally occluded, then 80%, EF 40%, inf HK.  He underwent PCI with [name] with  placement of a Promus DES to the mRCA x 2  . History of stroke     evidence of CVA on MRI in past  . History of alcohol abuse   . Vision, loss, sudden 02/27/2011  . Gait instability 02/27/2011  . Sleep apnea   . Hyperlipidemia   . Stroke 2012    uses cane to steady due to dizziness   Current Outpatient Prescriptions  Medication Sig Dispense Refill  . aspirin EC 81 MG tablet Take 1 tablet (81 mg total) by mouth daily.      Marland Kitchen atorvastatin (LIPITOR) 20 MG tablet Take 1 tablet (20 mg total) by mouth daily.  30 tablet  5  . carvedilol (COREG) 25 MG tablet TAKE ONE TABLET BY MOUTH TWICE DAILY WITH A MEAL  60 tablet  5  . clopidogrel (PLAVIX) 75 MG tablet TAKE 1 TABLET BY MOUTH ONCE DAILY  30 tablet  3  . hydrochlorothiazide (MICROZIDE) 12.5 MG capsule TAKE TWO CAPSULES BY MOUTH EVERY DAY  60 capsule  5  . metFORMIN (GLUCOPHAGE) 1000 MG tablet TAKE ONE TABLET BY MOUTH TWICE DAILY WITH A MEAL  60 tablet  4  . MOVIPREP [place] take as directed no substitutions  1 kit  0  . nitroGLYCERIN (NITROSTAT) 0.4 MG SL tablet Place  1 tablet (0.4 mg total) under the tongue every 5 (five) minutes as needed.  100 tablet  0  . peg 3350 powder (MOVIPREP) 100 G SOLR Take 1 kit (100 g total) by mouth once.  1 kit  0  . quinapril (ACCUPRIL) 10 MG tablet Take 1 tablet (10 mg total) by mouth daily.  30 tablet  5  . spironolactone (ALDACTONE) 25 MG tablet Take 1 tablet (25 mg total) by mouth daily.  30 tablet  6   Family History  Problem Relation Age of Onset  . Adopted: Yes  . Heart attack Mother     Died 61  . Heart failure Mother   . Diabetes Mother   . Heart attack Father   . Heart failure Father   . Diabetes Father   . Heart disease Sister     s/p CABG x3 in her mid 58's  . Prostate cancer Father   . Crohn's disease Sister   . Crohn's disease Other     niece  . Kidney disease Brother    History   Social History  . Marital Status: Single    Spouse Name: N/A    Number of Children: 0   . Years of Education: N/A   Occupational History  . disabled    Social History Main Topics  . Smoking status: Never Smoker   . Smokeless tobacco: Never Used  . Alcohol Use: 2.4 oz/week    2 Cans of beer, 2 Shots of liquor per week  . Drug Use: No  . Sexually Active: Yes   Other Topics Concern  . None   Social History Narrative   Lives in [place] with his cousinNo childrenExercises 3 times a week   Review of Systems:  General: no fevers, chills, no changes in body weight, no changes in appetite Skin: no rash HEENT: Decreased acuity in left eye, EOMI, pupils equal, pupils round, pupils reactive to light, no injection and anicteric.  Pulm: no dyspnea, coughing, wheezing CV: no chest pain, palpitations, shortness of breath Abd: no nausea/vomiting, abdominal pain, diarrhea/constipation GU: no dysuria, hematuria, polyuria Ext: no arthralgias, myalgias Neuro: weakness on the left side of body.   Objective:  Physical Exam: Filed Vitals:   06/24/12 1437 06/24/12 1514  BP: 168/93 138/80  Pulse: 80 80  Temp: 97.8 F (36.6 C)   TempSrc: Oral   Height: 6' (1.829 m)   Weight: 204 lb (92.534 kg)   SpO2: 100%    General: not in acute distress HEENT: PERRL, EOMI, no scleral icterus. Decreased visual acuity in both eyes, worse on the left side. Cardiac: S1/S2, RRR, No murmurs, gallops or rubs Pulm: Good air movement bilaterally, Clear to auscultation bilaterally, No rales, wheezing, rhonchi or rubs. Abd: Soft,  nondistended, nontender, no rebound pain, no organomegaly, BS present Ext: No rashes or edema, 2+DP/PT pulse bilaterally Neuro: alert and oriented X3, cranial nerves II-XII grossly intact.  left side paralysis. Muscle strength 3/5 in left lower extremety and 2/5 in left upper extremeity.     Assessment & Plan:   Addendum: Patient had normal colonoscopy, indicating that it is unlikely that patient is losing blood from colon. Repeated anemia panel showed no iron  deficiency.. Patient has normocytic anemia with normal WBC and platelets,  reticulocyte 1.5% which is less than 3%, narrowing down the DD to hypoplastic anemia, infectious disease (unlikely, no signs of infection), Drug-induced anemia (he does not have typical anemia-causing drugs on the list), renal disease (his GFR was normal),  and blood loss (unlikely given no bleeding and normal colonoscope).  Together, etiology is not clear.  -plan: will give him referral to hematology for further evaluation.  CBC:    Component Value Date/Time   WBC 9.1 06/24/2012 1522   HGB 9.5* 06/24/2012 1522   HCT 28.5* 06/24/2012 1522   PLT 355 06/24/2012 1522   MCV 86.1 06/24/2012 1522   NEUTROABS 5.4 06/24/2012 1522   LYMPHSABS 2.3 06/24/2012 1522   MONOABS 0.9 06/24/2012 1522   EOSABS 0.5 06/24/2012 1522   BASOSABS 0.0 06/24/2012 1522   Anemia Panel:  Lab 06/24/12 1522  VITAMINB12 596  FOLATE 12.9  FERRITIN 232  TIBC 327  IRON 86  RETICCTPCT 1.5   Ivor Costa, MD PGY2, Internal Medicine Teaching Service Pager: [phone]

## 2012-06-24 NOTE — Assessment & Plan Note (Signed)
Lab Results  Component Value Date   HGBA1C 6.1 06/24/2012   HGBA1C 5.7 11/13/2011   HGBA1C 5.8 05/29/2011     Assessment:  Diabetes control: good control (HgbA1C at goal)  Progress toward A1C goal:  at goal  Comments:   Plan:  Medications:  continue current medications  Home glucose monitoring:   Frequency:     Timing:    Instruction/counseling given: reminded to bring medications to each visit  Educational resources provided: brochure  Self management tools provided:    Other plans: He is well controlled. A1c is 6.1 today. Continue current regimen.

## 2012-06-24 NOTE — Patient Instructions (Signed)
1. You have done great job in taking all your medications. I appreciate it very much. Please continue doing that. 2. Please take all medications as prescribed.  3. If you have worsening of your symptoms or new symptoms arise, please call the clinic ([phone]), or go to the ER immediately if symptoms are severe.     

## 2012-06-24 NOTE — Assessment & Plan Note (Signed)
-   eye examination, flu shot, and pneumococcal vaccination and tetanus vaccination are up-to-date. - Patient is going to get colonoscopy tomorrow - Will do foot examination today

## 2012-06-24 NOTE — Telephone Encounter (Signed)
Spoke with Christian Mate and she said it was sent to [facility] but it must have been put back on shelf if patient did not pick up within 7 days of it being filled.  I called and spoke to patient's niece and advised her to go to pharmacy and pick up prep as it was resent.  Advised her to call if she has any further problems with the prescription.  She agreed.

## 2012-06-24 NOTE — Telephone Encounter (Signed)
Prep has been resent to the [facility] will notify the pt

## 2012-06-24 NOTE — Assessment & Plan Note (Signed)
BP Readings from Last 3 Encounters:  06/24/12 138/80  12/20/11 94/68  11/13/11 149/85    Lab Results  Component Value Date   NA 139 11/13/2011   K 4.3 11/13/2011   CREATININE 1.37* 11/13/2011    Assessment:  Blood pressure control: controlled  Progress toward BP goal:  at goal  Comments:   Plan:  Medications:  continue current medications  Educational resources provided: brochure  Self management tools provided:    Other plans: Blood pressure is well controlled. We'll continue current regimen.

## 2012-06-25 ENCOUNTER — Encounter: Payer: Self-pay | Admitting: Gastroenterology

## 2012-06-25 ENCOUNTER — Other Ambulatory Visit: Payer: Self-pay | Admitting: Internal Medicine

## 2012-06-25 ENCOUNTER — Ambulatory Visit (AMBULATORY_SURGERY_CENTER): Payer: Medicaid Other | Admitting: Gastroenterology

## 2012-06-25 VITALS — BP 166/101 | HR 68 | Temp 97.4°F | Resp 12 | Ht 72.0 in | Wt 198.0 lb

## 2012-06-25 DIAGNOSIS — D649 Anemia, unspecified: Secondary | ICD-10-CM

## 2012-06-25 DIAGNOSIS — Z1211 Encounter for screening for malignant neoplasm of colon: Secondary | ICD-10-CM

## 2012-06-25 LAB — GLUCOSE, CAPILLARY
Glucose-Capillary: 165 mg/dL — ABNORMAL HIGH (ref 70–99)
Glucose-Capillary: 151 mg/dL — ABNORMAL HIGH (ref 70–99)

## 2012-06-25 LAB — ANEMIA PANEL
%SAT: 26 % (ref 20–55)
ABS Retic: 49.7 10*3/uL (ref 19.0–186.0)
Ferritin: 232 ng/mL (ref 22–322)
Retic Ct Pct: 1.5 % (ref 0.4–2.3)
Vitamin B-12: 596 pg/mL (ref 211–911)

## 2012-06-25 MED ORDER — SODIUM CHLORIDE 0.9 % IV SOLN: 500.0000 mL | INTRAVENOUS | Status: DC

## 2012-06-25 NOTE — Patient Instructions (Addendum)
You should restart your plavix today.  YOU HAD AN ENDOSCOPIC PROCEDURE TODAY AT [facility]: Refer to the procedure report that was given to you for any specific questions about what was found during the examination.  If the procedure report does not answer your questions, please call your gastroenterologist to clarify.  If you requested that your care partner not be given the details of your procedure findings, then the procedure report has been included in a sealed envelope for you to review at your convenience later.  YOU SHOULD EXPECT: Some feelings of bloating in the abdomen. Passage of more gas than usual.  Walking can help get rid of the air that was put into your GI tract during the procedure and reduce the bloating. If you had a lower endoscopy (such as a colonoscopy or flexible sigmoidoscopy) you may notice spotting of blood in your stool or on the toilet paper. If you underwent a bowel prep for your procedure, then you may not have a normal bowel movement for a few days.  DIET: Your first meal following the procedure should be a light meal and then it is ok to progress to your normal diet.  A half-sandwich or bowl of soup is an example of a good first meal.  Heavy or fried foods are harder to digest and may make you feel nauseous or bloated.  Likewise meals heavy in dairy and vegetables can cause extra gas to form and this can also increase the bloating.  Drink plenty of fluids but you should avoid alcoholic beverages for 24 hours.  ACTIVITY: Your care partner should take you home directly after the procedure.  You should plan to take it easy, moving slowly for the rest of the day.  You can resume normal activity the day after the procedure however you should NOT DRIVE or use heavy machinery for 24 hours (because of the sedation medicines used during the test).    SYMPTOMS TO REPORT IMMEDIATELY: A gastroenterologist can be reached at any hour.  During normal business hours,  8:30 AM to 5:00 PM Monday through Friday, call [phone].  After hours and on weekends, please call the GI answering service at [phone] who will take a message and have the physician on call contact you.   Following lower endoscopy (colonoscopy or flexible sigmoidoscopy):  Excessive amounts of blood in the stool  Significant tenderness or worsening of abdominal pains  Swelling of the abdomen that is new, acute  Fever of 100F or higher   FOLLOW UP: If any biopsies were taken you will be contacted by phone or by letter within the next 1-3 weeks.  Call your gastroenterologist if you have not heard about the biopsies in 3 weeks.  Our staff will call the home number listed on your records the next business day following your procedure to check on you and address any questions or concerns that you may have at that time regarding the information given to you following your procedure. This is a courtesy call and so if there is no answer at the home number and we have not heard from you through the emergency physician on call, we will assume that you have returned to your regular daily activities without incident.  SIGNATURES/CONFIDENTIALITY: You and/or your care partner have signed paperwork which will be entered into your electronic medical record.  These signatures attest to the fact that that the information above on your After Visit Summary has been reviewed and is understood.  Full responsibility of the confidentiality of this discharge information lies with you and/or your care-partner.    Normal colonoscopy.  Repeat in 10 years-2024.

## 2012-06-25 NOTE — Progress Notes (Addendum)
Blood pressure elevated upon entering procedure room. Pt states he is anxious and nervous. md made aware with no further orders received. ewm  Pt tolerated procedure well. ewm

## 2012-06-25 NOTE — Op Note (Signed)
[facility]  [address] [place], [id number]   COLONOSCOPY PROCEDURE REPORT  PATIENT: Lukka, Prinkey  MR#: [id number] BIRTHDATE: Sep 27, 1960 , 52  yrs. old GENDER: Male ENDOSCOPIST: Milus Banister, MD REFERRED BY: Ivor Costa, MD PROCEDURE DATE:  06/25/2012 PROCEDURE:   Colonoscopy, screening ASA CLASS:   Class III INDICATIONS:average risk screening. MEDICATIONS: Fentanyl 50 mcg IV, Versed 5 mg IV, and These medications were titrated to patient response per physician's verbal order  DESCRIPTION OF PROCEDURE:   After the risks benefits and alternatives of the procedure were thoroughly explained, informed consent was obtained.  A digital rectal exam revealed no abnormalities of the rectum.   The LB [id number]-[id number]  endoscope was introduced through the anus and advanced to the cecum, which was identified by both the appendix and ileocecal valve. No adverse events experienced.   The quality of the prep was Moviprep fair The instrument was then slowly withdrawn as the colon was fully examined.   COLON FINDINGS: A normal appearing cecum, ileocecal valve, and appendiceal orifice were identified.  The ascending, hepatic flexure, transverse, splenic flexure, descending, sigmoid colon and rectum appeared unremarkable.  No polyps or cancers were seen. Retroflexed views revealed no abnormalities. The time to cecum=4 minutes 49 seconds.  Withdrawal time=10 minutes 01 seconds.  The scope was withdrawn and the procedure completed. COMPLICATIONS: There were no complications.  ENDOSCOPIC IMPRESSION: Normal colon No polyps or cancers  RECOMMENDATIONS: You should continue to follow colorectal cancer screening guidelines for "routine risk" patients with a repeat colonoscopy in 10 years. There is no need for FOBT (stool) testing for at least 5 years. You can restart your plavix today.   eSigned:  Milus Banister, MD 06/25/2012 9:57 AM

## 2012-06-25 NOTE — Progress Notes (Signed)
Patient did not experience any of the following events: a burn prior to discharge; a fall within the facility; wrong site/side/patient/procedure/implant event; or a hospital transfer or hospital admission upon discharge from the facility. (G8907) Patient did not have preoperative order for IV antibiotic SSI prophylaxis. (G8918)  

## 2012-06-26 ENCOUNTER — Telehealth: Payer: Self-pay | Admitting: *Deleted

## 2012-06-26 NOTE — Telephone Encounter (Signed)
  Follow up Call-  Call back number 06/25/2012  Post procedure Call Back phone  # [phone]  Permission to leave phone message Yes     Patient questions:  Do you have a fever, pain , or abdominal swelling? no Pain Score  0 *  Have you tolerated food without any problems? yes  Have you been able to return to your normal activities? yes  Do you have any questions about your discharge instructions: Diet   no Medications  no Follow up visit  no  Do you have questions or concerns about your Care? no  Actions: * If pain score is 4 or above: No action needed, pain <4.

## 2012-07-10 ENCOUNTER — Telehealth: Payer: Self-pay | Admitting: Oncology

## 2012-07-10 NOTE — Telephone Encounter (Signed)
S/W PT IN REF TO NP APPT. 08/07/12 @10 :47 REFERRING DR [place] DX ANEMIA MAILED NP PACKET

## 2012-07-11 ENCOUNTER — Telehealth: Payer: Self-pay | Admitting: Oncology

## 2012-07-11 NOTE — Telephone Encounter (Signed)
C/D 07/11/12 for appt. 08/07/12

## 2012-08-05 ENCOUNTER — Other Ambulatory Visit: Payer: Self-pay | Admitting: Oncology

## 2012-08-07 ENCOUNTER — Ambulatory Visit: Payer: Medicaid Other

## 2012-08-07 ENCOUNTER — Telehealth: Payer: Self-pay | Admitting: Oncology

## 2012-08-07 ENCOUNTER — Other Ambulatory Visit (HOSPITAL_BASED_OUTPATIENT_CLINIC_OR_DEPARTMENT_OTHER): Payer: Medicaid Other | Admitting: Lab

## 2012-08-07 ENCOUNTER — Encounter: Payer: Self-pay | Admitting: Oncology

## 2012-08-07 ENCOUNTER — Ambulatory Visit (HOSPITAL_BASED_OUTPATIENT_CLINIC_OR_DEPARTMENT_OTHER): Payer: Medicaid Other | Admitting: Oncology

## 2012-08-07 VITALS — BP 126/79 | HR 88 | Temp 97.7°F | Resp 20 | Ht 70.0 in | Wt 206.4 lb

## 2012-08-07 DIAGNOSIS — D649 Anemia, unspecified: Secondary | ICD-10-CM

## 2012-08-07 LAB — COMPREHENSIVE METABOLIC PANEL (CC13)
Albumin: 3.6 g/dL (ref 3.5–5.0)
Alkaline Phosphatase: 70 U/L (ref 40–150)
BUN: 25.8 mg/dL (ref 7.0–26.0)
Calcium: 9.6 mg/dL (ref 8.4–10.4)
Creatinine: 1.6 mg/dL — ABNORMAL HIGH (ref 0.7–1.3)
Glucose: 261 mg/dl — ABNORMAL HIGH (ref 70–99)
Potassium: 4.5 mEq/L (ref 3.5–5.1)

## 2012-08-07 LAB — CBC WITH DIFFERENTIAL/PLATELET
Basophils Absolute: 0 10*3/uL (ref 0.0–0.1)
Eosinophils Absolute: 0.5 10*3/uL (ref 0.0–0.5)
HGB: 9.3 g/dL — ABNORMAL LOW (ref 13.0–17.1)
MONO#: 0.8 10*3/uL (ref 0.1–0.9)
MONO%: 7.4 % (ref 0.0–14.0)
NEUT#: 7.2 10*3/uL — ABNORMAL HIGH (ref 1.5–6.5)
RBC: 3.2 10*6/uL — ABNORMAL LOW (ref 4.20–5.82)
RDW: 14.4 % (ref 11.0–14.6)
WBC: 10.3 10*3/uL (ref 4.0–10.3)
lymph#: 1.8 10*3/uL (ref 0.9–3.3)

## 2012-08-07 NOTE — Telephone Encounter (Signed)
Talked to pt and gave him appt for lab and MD visit on  July 2014

## 2012-08-07 NOTE — Progress Notes (Signed)
Note dictated

## 2012-08-07 NOTE — Progress Notes (Signed)
Checked in new pt with no financial concerns. °

## 2012-08-08 NOTE — Progress Notes (Signed)
CC:   Kevin Costa, MD  REASON FOR CONSULTATION:  Anemia.  HISTORY OF PRESENT ILLNESS:  This is a pleasant 52 year old African American gentleman currently of [place].  He is a gentleman who used to work as a Medical laboratory scientific officer at the [employer]; however, he has been on disability for the last 2 years or so.  He had developed a CVA that left him with a left-sided weakness, as well as has a history of coronary artery disease.  He has also a few co-morbid conditions including diabetes and hypertension and cardiomyopathy.  The patient has been noted to be anemic at least based on his laboratory dating back to 2012.  His hemoglobin has fluctuated between 9 and 10, it was as low as 9.1 back in October 2012.  His workups have been unrevealing.  His most recent iron studies in February 2014 showed his ferritin was at 232, iron level of 86, and folate of 12.9.  He had a colonoscopy as well that did not really show any evidence or any source of bleeding at this time. For that reason, patient referred to me for evaluation.  His most recent CBC showed his hemoglobin was 9.3, white cell count of 10.3, a platelet count of 303, his MCV was around 88.7.  He had a normal differential. His reticulocyte count was within normal range of 1.5.  Clinically, Kevin Soto reports really no major changes in his performance status or activity level.  He is limited by his recent stroke.  He is mobile, he does not use a walker or wheelchair, but he is very slow in his mobility.  He walks in the gym at times.  Has not reported any weakness, has not reported any fatigue, has not reported any excessive shortness of breath, has not reported any chest pain.  Still continues to perform some of his activities of daily living.  He does not drive.  He had not reported any epistaxis, had not reported any hematochezia, had not reported any genitourinary bleeding.  REVIEW OF SYSTEMS:  Does not report any headaches,  blurry vision, double vision.  Does not report any motor or sensory neuropathy.  Does not report any alteration in mental status.  Does not report any psychiatric issues, depression.  Does not report any fever, chills, sweats.  Does not report any cough, hemoptysis, hematemesis.  No nausea or vomiting. Does not report any abdominal pain.  No hematochezia, melena, frequency, urgency, or hesitancy.  No musculoskeletal complaints.  No arthralgias, myalgias.  No heat or cold intolerance.  No bleeding or clotting diathesis.  Rest of review of systems is unremarkable.  PAST MEDICAL HISTORY:  Significant for coronary artery disease, diabetes, hypertension, hyperlipidemia, ischemic cardiomyopathy, history of CVA.  He also has history of alcohol abuse.  Hyperlipidemia.  MEDICATION:  He is on aspirin, Lipitor, Coreg, Plavix, hydrochlorothiazide, metformin, nitroglycerin, Accupril, and Aldactone.  FAMILY HISTORY:  Significant history of coronary artery disease and history of strokes.  SOCIAL HISTORY:  He is single.  Denied any alcohol abuse currently, but he used to be a heavy drinker in the past.  Never smoked.  Again, worked as a Medical laboratory scientific officer, currently not working.  PHYSICAL EXAMINATION:  General:  Alert, awake gentleman who did not appear in any active distress.  Vital Signs:  Blood pressure is 126/79, pulse 88, respirations 20, temperature is 97.7, weight is 206 pounds. HEENT:  Head is normocephalic, atraumatic.  Pupils equal, round, reactive to light.  Oral mucosa moist and pink.  Neck:  Supple without lymphadenopathy.  Heart:  Regular rate and rhythm.  S1 and S2.  Lungs: Clear to auscultation without rhonchi, wheeze, or dullness to percussion.  Abdomen:  Soft, nontender.  No hepatosplenomegaly. Extremities:  No clubbing, cyanosis, or edema.  Neurologic:  Intact motor, sensory, and deep tendon reflexes.  LABORATORY DATA:  Reviewed today showed a hemoglobin of 9.3, white cell count  of 10.3, platelet count of 303.  There is a normal differential. His peripheral smear was personally reviewed today.  I could not really see any clear evidence of dysplasia.  There are abundant microcytic and hypochromic red cells at this time.  ASSESSMENT AND PLAN:  A 52 year old gentleman with normocytic, normochromic anemia, although on his peripheral smear some slight evidence of hypochromia.  The differential diagnosis discussed with Kevin Soto today.  Likely etiology is probably multifactorial in nature. Anemia of chronic disease is probably a big factor in him, he has had multiple co-morbid conditions, as well as history of cerebrovascular accident and coronary artery disease.  With his current medication, it is certainly possible that he could have mild bone marrow suppression that is contributing to his anemia.  Also has history of alcohol abuse, as well.  Again, chronic alcohol intake can cause myelosuppression and certainly mild anemia.  Other conditions that could be contributing, he could have early myelodysplastic syndrome, he could also have an element of a plasma cell disorder such as multiple myeloma.  Again, that is less likely, but certainly a possibility at this point.  Anemia of chronic disease, as mentioned, possibly also renal disease anemia.  His estimated glomerular filtration rate is around 60 cc/minute, which makes it less likely, but could be a contributing factor.  In terms of working this up, I will obtain a serum protein electrophoresis as well as erythropoietin level and continue to monitor him.  I will repeat his counts in about 4 weeks.  I have talked to him about using growth factor support such as Aranesp or Procrit should he become symptomatic or his hemoglobin drops further.  Also, possibly discussed a bone marrow biopsy down the line if his counts or he develops any other cytopenias.    ______________________________ Kevin Soto,  M.D. FNS/MEDQ  D:  08/07/2012  T:  08/08/2012  Job:  [id number]

## 2012-08-11 ENCOUNTER — Other Ambulatory Visit: Payer: Self-pay | Admitting: *Deleted

## 2012-08-11 DIAGNOSIS — I10 Essential (primary) hypertension: Secondary | ICD-10-CM

## 2012-08-11 LAB — SPEP & IFE WITH QIG
Beta 2: 5.9 % (ref 3.2–6.5)
Beta Globulin: 6 % (ref 4.7–7.2)
IgA: 335 mg/dL (ref 68–379)
IgG (Immunoglobin G), Serum: 1710 mg/dL — ABNORMAL HIGH (ref 650–1600)
Total Protein, Serum Electrophoresis: 7.7 g/dL (ref 6.0–8.3)

## 2012-08-12 MED ORDER — QUINAPRIL HCL 10 MG PO TABS: 10.0000 mg | ORAL_TABLET | Freq: Every day | ORAL | Status: DC

## 2012-08-19 NOTE — Telephone Encounter (Signed)
Rx called in to pharmacy. 

## 2012-08-21 ENCOUNTER — Encounter: Payer: Self-pay | Admitting: Oncology

## 2012-09-01 ENCOUNTER — Other Ambulatory Visit: Payer: Self-pay | Admitting: Internal Medicine

## 2012-09-01 DIAGNOSIS — I639 Cerebral infarction, unspecified: Secondary | ICD-10-CM

## 2012-09-04 ENCOUNTER — Other Ambulatory Visit: Payer: Self-pay | Admitting: Internal Medicine

## 2012-09-04 DIAGNOSIS — I639 Cerebral infarction, unspecified: Secondary | ICD-10-CM

## 2012-09-10 NOTE — Telephone Encounter (Signed)
Message completed nurse opened a new encounter.Marland KitchenMarland Kitchen

## 2012-09-23 ENCOUNTER — Other Ambulatory Visit: Payer: Self-pay | Admitting: Cardiology

## 2012-09-23 NOTE — Telephone Encounter (Signed)
..   Requested Prescriptions   Pending Prescriptions Disp Refills  . carvedilol (COREG) 25 MG tablet [Pharmacy Med Name: CARVEDILOL 25MG      TAB] 60 tablet 2    Sig: TAKE ONE TABLET BY MOUTH TWICE DAILY WITH MEALS  .Marland KitchenPatient needs to contact office to schedule  Appointment .

## 2012-09-30 ENCOUNTER — Telehealth: Payer: Self-pay | Admitting: *Deleted

## 2012-09-30 ENCOUNTER — Other Ambulatory Visit: Payer: Self-pay | Admitting: Internal Medicine

## 2012-09-30 DIAGNOSIS — E785 Hyperlipidemia, unspecified: Secondary | ICD-10-CM

## 2012-09-30 MED ORDER — ROSUVASTATIN CALCIUM 5 MG PO TABS: 5.0000 mg | ORAL_TABLET | Freq: Every day | ORAL | Status: DC

## 2012-09-30 NOTE — Telephone Encounter (Signed)
Refill request for Lipitor - but [facility] can only provide Crestor for free. Need new rx if u decide to change medication. Thanks

## 2012-09-30 NOTE — Telephone Encounter (Signed)
I will switch patient to Crestor 5 mg daily from now. I will write a prescription for patient now. Thanks.  Ivor Costa, MD PGY2, Internal Medicine Teaching Service Pager: [phone]

## 2012-10-01 NOTE — Telephone Encounter (Signed)
Crestor rx faxed to [facility].

## 2012-11-27 ENCOUNTER — Other Ambulatory Visit: Payer: Self-pay

## 2012-12-02 ENCOUNTER — Other Ambulatory Visit: Payer: Self-pay | Admitting: Internal Medicine

## 2012-12-02 DIAGNOSIS — E1151 Type 2 diabetes mellitus with diabetic peripheral angiopathy without gangrene: Secondary | ICD-10-CM

## 2012-12-09 ENCOUNTER — Ambulatory Visit (HOSPITAL_BASED_OUTPATIENT_CLINIC_OR_DEPARTMENT_OTHER): Payer: Medicaid Other | Admitting: Oncology

## 2012-12-09 ENCOUNTER — Other Ambulatory Visit (HOSPITAL_BASED_OUTPATIENT_CLINIC_OR_DEPARTMENT_OTHER): Payer: Medicaid Other | Admitting: Lab

## 2012-12-09 VITALS — BP 149/85 | HR 82 | Temp 98.2°F | Resp 18 | Ht 70.0 in | Wt 220.9 lb

## 2012-12-09 DIAGNOSIS — D649 Anemia, unspecified: Secondary | ICD-10-CM

## 2012-12-09 LAB — CBC WITH DIFFERENTIAL/PLATELET
Eosinophils Absolute: 0.8 10*3/uL — ABNORMAL HIGH (ref 0.0–0.5)
HCT: 32.3 % — ABNORMAL LOW (ref 38.4–49.9)
LYMPH%: 17.6 % (ref 14.0–49.0)
MONO#: 0.9 10*3/uL (ref 0.1–0.9)
NEUT#: 7.5 10*3/uL — ABNORMAL HIGH (ref 1.5–6.5)
NEUT%: 66.8 % (ref 39.0–75.0)
Platelets: 313 10*3/uL (ref 140–400)
WBC: 11.3 10*3/uL — ABNORMAL HIGH (ref 4.0–10.3)
lymph#: 2 10*3/uL (ref 0.9–3.3)

## 2012-12-09 LAB — COMPREHENSIVE METABOLIC PANEL (CC13)
ALT: 10 U/L (ref 0–55)
CO2: 28 mEq/L (ref 22–29)
Calcium: 9.6 mg/dL (ref 8.4–10.4)
Chloride: 103 mEq/L (ref 98–109)
Creatinine: 1.5 mg/dL — ABNORMAL HIGH (ref 0.7–1.3)
Glucose: 226 mg/dl — ABNORMAL HIGH (ref 70–140)
Sodium: 139 mEq/L (ref 136–145)
Total Bilirubin: 0.3 mg/dL (ref 0.20–1.20)
Total Protein: 7.5 g/dL (ref 6.4–8.3)

## 2012-12-09 NOTE — Progress Notes (Signed)
Hematology and Oncology Follow Up Visit  Kevin Soto [id number] January 15, 1961 52 y.o. 12/09/2012 11:29 AM   Principle Diagnosis: A 52 year old gentleman with normocytic, normochromic anemia likely multifactorial diagnosed in in 07/2012. He has an element of chronic disease and anemia of renal disease.    Current therapy: Observation and follow up.   Interim History: Kevin Soto presents for a follow up visit. He is a nice man with the above issues. Since his last visit, he reports doing well. He has no new problems. Kevin Soto reports really no major changes in his performance status or  activity level. He is limited by his recent stroke. He is mobile, he does not use a walker or wheelchair, but he is very slow in his  mobility. He walks in the gym at times. Has not reported any weakness, has not reported any fatigue, has not reported any excessive shortness of breath, has not reported any chest pain. Still continues to perform some of his activities of daily living. He does not drive. He had not reported any epistaxis, had not reported any hematochezia, had not reported any genitourinary bleeding.  His lab testing from 07/2012 was discussed today and was unremarkable.   Medications: I have reviewed the patient's current medications.  Current Outpatient Prescriptions  Medication Sig Dispense Refill  . aspirin EC 81 MG tablet Take 1 tablet (81 mg total) by mouth daily.      . carvedilol (COREG) 25 MG tablet TAKE ONE TABLET BY MOUTH TWICE DAILY WITH MEALS  60 tablet  2  . clopidogrel (PLAVIX) 75 MG tablet TAKE 1 TABLET BY MOUTH ONCE A DAY  30 tablet  5  . hydrochlorothiazide (MICROZIDE) 12.5 MG capsule TAKE TWO CAPSULES BY MOUTH EVERY DAY  60 capsule  5  . metFORMIN (GLUCOPHAGE) 1000 MG tablet TAKE ONE TABLET BY MOUTH TWICE DAILY WITH  A  MEAL  60 tablet  5  . nitroGLYCERIN (NITROSTAT) 0.4 MG SL tablet Place 1 tablet (0.4 mg total) under the tongue every 5 (five) minutes as needed.  100 tablet   0  . quinapril (ACCUPRIL) 10 MG tablet Take 1 tablet (10 mg total) by mouth daily.  30 tablet  5  . rosuvastatin (CRESTOR) 5 MG tablet Take 1 tablet (5 mg total) by mouth daily.  90 tablet  3  . spironolactone (ALDACTONE) 25 MG tablet Take 1 tablet (25 mg total) by mouth daily.  30 tablet  6   No current facility-administered medications for this visit.     Allergies:  Allergies  Allergen Reactions  . Clonidine Derivatives     Dizzy, too sleepy    Past Medical History, Surgical history, Social history, and Family History were reviewed and updated.  Review of Systems: Constitutional:  Negative for fever, chills, night sweats, anorexia, weight loss, pain. Cardiovascular: no chest pain or dyspnea on exertion Respiratory: negative Neurological: negative Dermatological: negative ENT: negative Skin: Negative. Gastrointestinal: negative Genito-Urinary: negative Hematological and Lymphatic: negative Breast: negative Musculoskeletal: negative Remaining ROS negative. Physical Exam: Blood pressure 149/85, pulse 82, temperature 98.2 F (36.8 C), temperature source Oral, resp. rate 18, height 5\' 10"  (1.778 m), weight 220 lb 14.4 oz (100.2 kg). ECOG: 1 General appearance: alert Head: Normocephalic, without obvious abnormality, atraumatic Neck: no adenopathy, no carotid bruit, no JVD, supple, symmetrical, trachea midline and thyroid not enlarged, symmetric, no tenderness/mass/nodules Lymph nodes: Cervical, supraclavicular, and axillary nodes normal. Heart:regular rate and rhythm, S1, S2 normal, no murmur, click, rub or gallop Lung:chest clear, no  wheezing, rales, normal symmetric air entry Abdomin: soft, non-tender, without masses or organomegaly EXT:no erythema, induration, or nodules   Lab Results: Lab Results  Component Value Date   WBC 11.3* 12/09/2012   HGB 10.6* 12/09/2012   HCT 32.3* 12/09/2012   MCV 88.8 12/09/2012   PLT 313 12/09/2012     Chemistry      Component Value  Date/Time   NA 139 12/09/2012 1035   NA 139 11/13/2011 1503   K 4.3 12/09/2012 1035   K 4.3 11/13/2011 1503   CL 105 08/07/2012 1059   CL 104 11/13/2011 1503   CO2 28 12/09/2012 1035   CO2 29 11/13/2011 1503   BUN 19.1 12/09/2012 1035   BUN 26* 11/13/2011 1503   CREATININE 1.5* 12/09/2012 1035   CREATININE 1.37* 11/13/2011 1503   CREATININE 1.11 02/27/2011 1723      Component Value Date/Time   CALCIUM 9.6 12/09/2012 1035   CALCIUM 10.0 11/13/2011 1503   ALKPHOS 57 12/09/2012 1035   ALKPHOS 65 02/27/2011 1723   AST 12 12/09/2012 1035   AST 11 02/27/2011 1723   ALT 10 12/09/2012 1035   ALT 11 02/27/2011 1723   BILITOT 0.30 12/09/2012 1035   BILITOT 0.3 02/27/2011 1723       Impression and Plan:  A 52 year old gentleman with normocytic, normochromic anemia. The differential diagnosis include multifactorial in nature.  Anemia of chronic disease is probably a big factor in him, he has had multiple co-morbid conditions, as well as history of cerebrovascular accident and coronary artery disease. Renal disease anemia is also a possibility. His estimated glomerular filtration rate is around 60 cc/minute with EPO level that is inappropriately normal.  His SPEP was normal as well.   The plan is to continue observation for now and one can consider EPO supplement if his Hgb drops again. It appears that his number is improving.   If he develops other cytopenias, will consider a bone marrow biopsy.      [facility], MD 7/22/201411:29 AM

## 2012-12-11 ENCOUNTER — Telehealth: Payer: Self-pay | Admitting: Oncology

## 2012-12-11 NOTE — Telephone Encounter (Signed)
lvm for pt regarding to Jan 2015 appt...mailed pt avs

## 2012-12-23 ENCOUNTER — Telehealth: Payer: Self-pay | Admitting: *Deleted

## 2012-12-23 NOTE — Telephone Encounter (Signed)
Call from [place] at Dr Carlos American office,  She is asking if  pt can come off Plavix  to have 4 teeth removed. They will fax over paperwork for you to sign.  Paperwork received and given to Dr Blaine Hamper

## 2012-12-26 NOTE — Telephone Encounter (Signed)
I called patient and will see him in clinic 12/30/12.   Ivor Costa, MD PGY3, Internal Medicine Teaching Service Pager: [phone]

## 2012-12-30 ENCOUNTER — Ambulatory Visit (INDEPENDENT_AMBULATORY_CARE_PROVIDER_SITE_OTHER): Payer: Self-pay | Admitting: Internal Medicine

## 2012-12-30 ENCOUNTER — Encounter: Payer: Self-pay | Admitting: Internal Medicine

## 2012-12-30 VITALS — BP 190/105 | HR 92 | Temp 98.2°F | Ht 72.0 in | Wt 213.4 lb

## 2012-12-30 DIAGNOSIS — I639 Cerebral infarction, unspecified: Secondary | ICD-10-CM

## 2012-12-30 DIAGNOSIS — I428 Other cardiomyopathies: Secondary | ICD-10-CM

## 2012-12-30 DIAGNOSIS — I6350 Cerebral infarction due to unspecified occlusion or stenosis of unspecified cerebral artery: Secondary | ICD-10-CM

## 2012-12-30 DIAGNOSIS — D649 Anemia, unspecified: Secondary | ICD-10-CM

## 2012-12-30 DIAGNOSIS — I420 Dilated cardiomyopathy: Secondary | ICD-10-CM

## 2012-12-30 DIAGNOSIS — I10 Essential (primary) hypertension: Secondary | ICD-10-CM

## 2012-12-30 DIAGNOSIS — E1142 Type 2 diabetes mellitus with diabetic polyneuropathy: Secondary | ICD-10-CM

## 2012-12-30 DIAGNOSIS — E785 Hyperlipidemia, unspecified: Secondary | ICD-10-CM

## 2012-12-30 DIAGNOSIS — E1149 Type 2 diabetes mellitus with other diabetic neurological complication: Secondary | ICD-10-CM

## 2012-12-30 LAB — GLUCOSE, CAPILLARY: Glucose-Capillary: 199 mg/dL — ABNORMAL HIGH (ref 70–99)

## 2012-12-30 LAB — POCT GLYCOSYLATED HEMOGLOBIN (HGB A1C): Hemoglobin A1C: 6.5

## 2012-12-30 MED ORDER — ATORVASTATIN CALCIUM 10 MG PO TABS: 10.0000 mg | ORAL_TABLET | Freq: Every day | ORAL | Status: DC

## 2012-12-30 MED ORDER — LISINOPRIL 20 MG PO TABS: 20.0000 mg | ORAL_TABLET | Freq: Every day | ORAL | Status: DC

## 2012-12-30 MED ORDER — SPIRONOLACTONE 25 MG PO TABS: 25.0000 mg | ORAL_TABLET | Freq: Every day | ORAL | Status: DC

## 2012-12-30 NOTE — Assessment & Plan Note (Signed)
It is stable. No new issue. Patient is taking Plavix and ASA. Will continue.

## 2012-12-30 NOTE — Assessment & Plan Note (Signed)
Lab Results  Component Value Date   HGBA1C 6.5 12/30/2012   HGBA1C 6.1 06/24/2012   HGBA1C 5.7 11/13/2011     Assessment: Diabetes control: good control (HgbA1C at goal) Progress toward A1C goal:  at goal Comments:   Plan: Medications:  continue current medications Home glucose monitoring: Frequency:   Timing:   Instruction/counseling given: reminded to bring blood glucose meter & log to each visit Educational resources provided: brochure Self management tools provided:   Other plans: it is well controlled. Will continue metformin 1000 mg twice a day.

## 2012-12-30 NOTE — Patient Instructions (Signed)
1. Please start taking lisinopril 20 mg daily from now. Please restart taking your spironolactone and Lipitor from now.  2. Please take all medications as prescribed.  3. If you have worsening of your symptoms or new symptoms arise, please call the clinic [phone]), or go to the ER immediately if symptoms are severe.

## 2012-12-30 NOTE — Progress Notes (Signed)
Patient ID: Kevin Soto, male   DOB: 1960/12/21, 52 y.o.   MRN: [id number] Subjective:   Patient ID: Kevin Soto male   DOB: 05/29/1960 52 y.o.   MRN: [id number]  CC:  Follow up visit.   HPI:  [name] is a 52 y.o. man with past medical history as outlined below, who presents for a followup visit today  1.) CAD and  ischemic cardiomyopathy:  patient is doing well. He doesn't have chest pain, shortness of breath leg edema, PND or orthopnea.   2.)  Macular hemorrhage:  He had laser surgery to his right eye on Feb of 2013. He still has poor vision, particularly on the left eye. There is no new symptoms to both eyes. He has been followed up by his ophthalmologist, Dr. Sabra Heck. He will have an appointment on 03/19/2013.    3.) DM-II: She is currently taking metformin 1000 mg twice a day. His A1c is 6.1 on 06/24/12. He does not have symptoms of either hyper or hypoglycemia.    4.) HTN: Patient was supposed to take Microzide 12.5 mg daily and Accupril 10 mg daily. He is also on Coreg for his cardiomyopathy. He was getting his mediations from [facility]. Recently he is under re-evaluation for financial issue. He reports that he did not get his blood pressure medications refill recently. Today his blood pressure is 190/105.    5.) He will need a dental procedure per his dentist in the near future. I was asked to recommend whether he can hold his plavix and ASA for his dental procedure. I discussed with Dr. Murlean Caller, patient can stop taking Plavix and ASA 5 days before the procedure and restart at 24 to 48 hours after the procedure.  Denies fever, chills, headaches, cough, chest pain, SOB, abdominal pain,diarrhea, constipation, dysuria, urgency, frequency, hematuria, joint pain or leg swelling.    Past Medical History  Diagnosis Date  . Diabetes mellitus   . Hypertension   . Macular hemorrhage   . Dyslipidemia   . Ischemic cardiomyopathy     echo 7/12: EF 40-45%, septal,  apical, inf basal HK, mod LVH, mild MR, mild LAE  . Anemia   . CAD (coronary artery disease)     cath 8/12:  LM ok, mLAD occluded with trivial collats from right AM and dRCA, mDx (small) 80%, RI 25%, tiny branch of OM 90%, mAM 30-40%, RCA sub-totally occluded, then 80%, EF 40%, inf HK.  He underwent PCI with [name] with placement of a Promus DES to the mRCA x 2  . History of stroke     evidence of CVA on MRI in past  . History of alcohol abuse   . Vision, loss, sudden 02/27/2011  . Gait instability 02/27/2011  . Sleep apnea   . Hyperlipidemia   . Stroke 2012    uses cane to steady due to dizziness   Current Outpatient Prescriptions  Medication Sig Dispense Refill  . aspirin EC 81 MG tablet Take 1 tablet (81 mg total) by mouth daily.      . carvedilol (COREG) 25 MG tablet TAKE ONE TABLET BY MOUTH TWICE DAILY WITH MEALS  60 tablet  2  . clopidogrel (PLAVIX) 75 MG tablet TAKE 1 TABLET BY MOUTH ONCE A DAY  30 tablet  5  . hydrochlorothiazide (MICROZIDE) 12.5 MG capsule TAKE TWO CAPSULES BY MOUTH EVERY DAY  60 capsule  5  . metFORMIN (GLUCOPHAGE) 1000 MG tablet TAKE ONE TABLET BY MOUTH TWICE DAILY WITH  A  MEAL  60 tablet  5  . nitroGLYCERIN (NITROSTAT) 0.4 MG SL tablet Place 1 tablet (0.4 mg total) under the tongue every 5 (five) minutes as needed.  100 tablet  0  . atorvastatin (LIPITOR) 10 MG tablet Take 1 tablet (10 mg total) by mouth daily.  90 tablet  3  . lisinopril (PRINIVIL,ZESTRIL) 20 MG tablet Take 1 tablet (20 mg total) by mouth daily.  90 tablet  3  . spironolactone (ALDACTONE) 25 MG tablet Take 1 tablet (25 mg total) by mouth daily.  30 tablet  6   No current facility-administered medications for this visit.   Family History  Problem Relation Age of Onset  . Adopted: Yes  . Heart attack Mother     Died 61  . Heart failure Mother   . Diabetes Mother   . Heart attack Father   . Heart failure Father   . Diabetes Father   . Heart disease Sister     s/p CABG x3 in her  mid 57's  . Prostate cancer Father   . Crohn's disease Sister   . Crohn's disease Other     niece  . Kidney disease Brother    History   Social History  . Marital Status: Single    Spouse Name: N/A    Number of Children: 0  . Years of Education: N/A   Occupational History  . disabled    Social History Main Topics  . Smoking status: Never Smoker   . Smokeless tobacco: Never Used  . Alcohol Use: 2.4 oz/week    2 Cans of beer, 2 Shots of liquor per week  . Drug Use: No  . Sexually Active: Yes   Other Topics Concern  . None   Social History Narrative   Lives in [place] with his cousin   No children   Exercises 3 times a week    Review of Systems: Full 14-point review of systems otherwise negative. See HPI.   Objective:  Physical Exam: Filed Vitals:   12/30/12 1435  BP: 190/105  Pulse: 92  Temp: 98.2 F (36.8 C)  TempSrc: Oral  Height: 6' (1.829 m)  Weight: 213 lb 6.4 oz (96.798 kg)  SpO2: 100%   General: not in acute distress HEENT: PERRL, EOMI, no scleral icterus. Decreased visual acuity in both eyes, worse on the left side. Cardiac: S1/S2, RRR, No murmurs, gallops or rubs Pulm: Good air movement bilaterally, Clear to auscultation bilaterally, No rales, wheezing, rhonchi or rubs. Abd: Soft,  nondistended, nontender, no rebound pain, no organomegaly, BS present Ext: No rashes or edema, 2+DP/PT pulse bilaterally Neuro: alert and oriented X3, cranial nerves II-XII grossly intact.  left side paralysis. Muscle strength 4/5 in left lower extremety and 3/5 in left upper extremeity.     Assessment & Plan:

## 2012-12-30 NOTE — Assessment & Plan Note (Signed)
BP Readings from Last 3 Encounters:  12/30/12 190/105  12/09/12 149/85  08/07/12 126/79    Lab Results  Component Value Date   NA 139 12/09/2012   K 4.3 12/09/2012   CREATININE 1.5* 12/09/2012    Assessment: Blood pressure control: moderately elevated Progress toward BP goal:  deteriorated Comments:   Plan: Medications:  will restart lisinopril 20 mg daily from now Educational resources provided: brochure Self management tools provided:   Other plans: Patient seems to have financial difficulty for getting his medications. [facility] did not refill his medications recently. Will start Lisinopril 20 mg daily. Patient agreed to get this medication from [facility] by himself. I will ask our financial counselor for helping him.

## 2012-12-31 LAB — BASIC METABOLIC PANEL WITH GFR
BUN: 34 mg/dL — ABNORMAL HIGH (ref 6–23)
CO2: 25 mEq/L (ref 19–32)
Chloride: 102 mEq/L (ref 96–112)
Creat: 1.69 mg/dL — ABNORMAL HIGH (ref 0.50–1.35)
Glucose, Bld: 195 mg/dL — ABNORMAL HIGH (ref 70–99)

## 2013-01-02 ENCOUNTER — Telehealth: Payer: Self-pay | Admitting: Licensed Clinical Social Worker

## 2013-01-02 NOTE — Telephone Encounter (Signed)
Pt's PCP voiced concern regarding pt's inability to afford his medication.  Pt will need to update his insurance information, unsure if pt is still active with Medicaid.  If not, Kevin Soto will need to meet with financial counselor to complete [facility] card application.  CSW placed called to pt.  CSW left message requesting return call. CSW provided contact hours and phone number.

## 2013-01-02 NOTE — Progress Notes (Signed)
Case discussed with Dr. Blaine Hamper at the time of the visit.  We reviewed the resident's history and exam and pertinent patient test results.  I agree with the assessment, diagnosis, and plan of care documented in the resident's note.  He will need close follow up for his blood pressure.

## 2013-01-03 ENCOUNTER — Other Ambulatory Visit: Payer: Self-pay | Admitting: Internal Medicine

## 2013-01-03 DIAGNOSIS — I10 Essential (primary) hypertension: Secondary | ICD-10-CM

## 2013-01-05 ENCOUNTER — Encounter: Payer: Self-pay | Admitting: Licensed Clinical Social Worker

## 2013-01-05 NOTE — Telephone Encounter (Signed)
Mr. Keyondre placed call to CSW.  Pt states the majority of his medications are on the $4 listing.  Pt states he will not have Medicare/Medicaid until March 2015.  CSW informed Mr. Salvia of MAP program for medications he is unable to obtain for $4.  Encouraged pt to contact financial counselor to apply for the [facility] card.  CSW will mail Mr. Blythe information about MAP.

## 2013-01-06 ENCOUNTER — Other Ambulatory Visit: Payer: Self-pay | Admitting: *Deleted

## 2013-01-06 DIAGNOSIS — I10 Essential (primary) hypertension: Secondary | ICD-10-CM

## 2013-01-06 MED ORDER — HYDROCHLOROTHIAZIDE 12.5 MG PO CAPS: ORAL_CAPSULE | ORAL | Status: DC

## 2013-01-08 ENCOUNTER — Other Ambulatory Visit: Payer: Self-pay | Admitting: *Deleted

## 2013-01-08 DIAGNOSIS — R079 Chest pain, unspecified: Secondary | ICD-10-CM

## 2013-01-09 MED ORDER — NITROGLYCERIN 0.4 MG SL SUBL: 0.4000 mg | SUBLINGUAL_TABLET | SUBLINGUAL | Status: DC | PRN

## 2013-01-09 NOTE — Telephone Encounter (Signed)
Nitrostat rx faxed to [facility].

## 2013-01-14 ENCOUNTER — Ambulatory Visit: Payer: Self-pay

## 2013-01-15 ENCOUNTER — Ambulatory Visit: Payer: Self-pay

## 2013-01-21 ENCOUNTER — Encounter: Payer: Self-pay | Admitting: Internal Medicine

## 2013-01-21 ENCOUNTER — Ambulatory Visit (INDEPENDENT_AMBULATORY_CARE_PROVIDER_SITE_OTHER): Payer: No Typology Code available for payment source | Admitting: Internal Medicine

## 2013-01-21 ENCOUNTER — Ambulatory Visit: Payer: No Typology Code available for payment source

## 2013-01-21 VITALS — BP 141/85 | HR 82 | Temp 97.7°F | Wt 215.4 lb

## 2013-01-21 DIAGNOSIS — I6350 Cerebral infarction due to unspecified occlusion or stenosis of unspecified cerebral artery: Secondary | ICD-10-CM

## 2013-01-21 DIAGNOSIS — I428 Other cardiomyopathies: Secondary | ICD-10-CM

## 2013-01-21 DIAGNOSIS — I639 Cerebral infarction, unspecified: Secondary | ICD-10-CM

## 2013-01-21 DIAGNOSIS — I10 Essential (primary) hypertension: Secondary | ICD-10-CM

## 2013-01-21 DIAGNOSIS — Z23 Encounter for immunization: Secondary | ICD-10-CM

## 2013-01-21 DIAGNOSIS — I420 Dilated cardiomyopathy: Secondary | ICD-10-CM

## 2013-01-21 DIAGNOSIS — E785 Hyperlipidemia, unspecified: Secondary | ICD-10-CM

## 2013-01-21 MED ORDER — SPIRONOLACTONE 25 MG PO TABS: 25.0000 mg | ORAL_TABLET | Freq: Every day | ORAL | Status: DC

## 2013-01-21 MED ORDER — ATORVASTATIN CALCIUM 10 MG PO TABS: 10.0000 mg | ORAL_TABLET | Freq: Every day | ORAL | Status: DC

## 2013-01-21 NOTE — Assessment & Plan Note (Signed)
His blood pressure is well controlled after restarting his blood pressure medications. Today blood pressure is 141/85. We continue current regimen.

## 2013-01-21 NOTE — Assessment & Plan Note (Addendum)
Patient has history of ischemic stroke. Currently he is taking Plavix and aspirin. Patient needs to do a dental procedure, which has been postponed due to significantly elevated blood pressure. Today his blood pressure is normalized. He can go ahead to do the procedure. I discussed with Dr. Murlean Caller, patient can stop taking Plavix and ASA 5 days before the procedure and restart at 24 to 48 hours after the procedure. I wrote a note to his dentist.

## 2013-01-21 NOTE — Progress Notes (Signed)
Patient ID: Kevin Soto, male   DOB: [birth year], 52 y.o.   MRN: [id number]  Subjective:   Patient ID: Kevin Soto male   DOB: Nov 20, 1960 52 y.o.   MRN: [id number]  CC: Follow up visit.  HPI:  Kevin Soto is a 52 y.o. man  with past medical history as outlined below, who presents for a followup visit today  1. HTN: Patient comes back for follow up for his HTN. Patient was supposed to take Microzide 12.5 mg daily and Accupril 10 mg daily. He was also on Coreg for his cardiomyopathy. He was getting his mediations from Kevin Soto. Recently he is under re-evaluation for financial issue. He reports that he did not get his blood pressure medications refill recently. I saw patient in clinic on 8/12, when his bp was 190/105 due to not taking his medications. I switched his Accupril to lisinopril 20 mg daily. With our social works help, he restarted his bp medications. His bp is 141/85 mmHg today. Patient does not have chest pain, shortness of breath, palpitation or leg edema.  5.) hx of Stroke: Patient is currently on Plavix and aspirin. He will need a dental procedure in the near future. I was asked to recommend whether he can hold his plavix and ASA for his dental procedure. I discussed with Dr. Murlean Soto, patient can stop taking Plavix and ASA 5 days before the procedure and restart at 24 to 48 hours after the procedure. Due to his blood pressure was significantly elevated in previous visit, his dental  procedure was postponed.   ROS: Denies fever, chills, headaches, cough, chest pain, SOB, abdominal pain,diarrhea, constipation, dysuria, urgency, frequency, hematuria.  Past Medical History  Diagnosis Date  . Diabetes mellitus   . Hypertension   . Macular hemorrhage   . Dyslipidemia   . Ischemic cardiomyopathy     echo 7/12: EF 40-45%, septal, apical, inf basal HK, mod LVH, mild MR, mild LAE  . Anemia   . CAD (coronary artery disease)     cath 8/12:  LM ok, mLAD occluded with  trivial collats from right AM and dRCA, mDx (small) 80%, RI 25%, tiny branch of OM 90%, mAM 30-40%, RCA sub-totally occluded, then 80%, EF 40%, inf HK.  He underwent PCI with Dr. Angelena Soto with placement of a Promus DES to the mRCA x 2  . History of stroke     evidence of CVA on MRI in past  . History of alcohol abuse   . Vision, loss, sudden 02/27/2011  . Gait instability 02/27/2011  . Sleep apnea   . Hyperlipidemia   . Stroke 2012    uses cane to steady due to dizziness   Current Outpatient Prescriptions  Medication Sig Dispense Refill  . aspirin EC 81 MG tablet Take 1 tablet (81 mg total) by mouth daily.      Marland Kitchen atorvastatin (LIPITOR) 10 MG tablet Take 1 tablet (10 mg total) by mouth daily.  90 tablet  3  . carvedilol (COREG) 25 MG tablet TAKE ONE TABLET BY MOUTH TWICE DAILY WITH MEALS  60 tablet  2  . clopidogrel (PLAVIX) 75 MG tablet TAKE 1 TABLET BY MOUTH ONCE A DAY  30 tablet  5  . hydrochlorothiazide (MICROZIDE) 12.5 MG capsule TAKE TWO CAPSULES BY MOUTH EVERY DAY  90 capsule  3  . lisinopril (PRINIVIL,ZESTRIL) 20 MG tablet Take 1 tablet (20 mg total) by mouth daily.  90 tablet  3  . metFORMIN (GLUCOPHAGE) 1000 MG tablet TAKE ONE  TABLET BY MOUTH TWICE DAILY WITH  A  MEAL  60 tablet  5  . nitroGLYCERIN (NITROSTAT) 0.4 MG SL tablet Place 1 tablet (0.4 mg total) under the tongue every 5 (five) minutes as needed.  100 tablet  1  . spironolactone (ALDACTONE) 25 MG tablet Take 1 tablet (25 mg total) by mouth daily.  30 tablet  6   No current facility-administered medications for this visit.   Family History  Problem Relation Age of Onset  . Adopted: Yes  . Heart attack Mother     Died 61  . Heart failure Mother   . Diabetes Mother   . Heart attack Father   . Heart failure Father   . Diabetes Father   . Heart disease Sister     s/p CABG x3 in her mid 31's  . Prostate cancer Father   . Crohn's disease Sister   . Crohn's disease Other     niece  . Kidney disease Brother     History   Social History  . Marital Status: Single    Spouse Name: N/A    Number of Children: 0  . Years of Education: N/A   Occupational History  . disabled    Social History Main Topics  . Smoking status: Never Smoker   . Smokeless tobacco: Never Used  . Alcohol Use: 2.4 oz/week    2 Cans of beer, 2 Shots of liquor per week  . Drug Use: No  . Sexual Activity: Yes   Other Topics Concern  . None   Social History Narrative   Lives in [place] with his cousin   No children   Exercises 3 times a week    Review of Systems: Full 14-point review of systems otherwise negative. See HPI.   Objective:  Physical Exam: Filed Vitals:   01/21/13 0926  BP: 141/85  Pulse: 82  Temp: 97.7 F (36.5 C)  TempSrc: Oral  Weight: 215 lb 6.4 oz (97.705 kg)   General: not in acute distress HEENT: PERRL, EOMI, no scleral icterus. Decreased visual acuity in both eyes, worse on the left side. Cardiac: S1/S2, RRR, No murmurs, gallops or rubs Pulm: Good air movement bilaterally, Clear to auscultation bilaterally, No rales, wheezing, rhonchi or rubs. Abd: Soft,  nondistended, nontender, no rebound pain, no organomegaly, BS present Ext: No rashes or edema, 2+DP/PT pulse bilaterally Neuro: alert and oriented X3, cranial nerves II-XII grossly intact.  left side paralysis. Muscle strength 4/5 in left lower extremety and 3/5 in left upper extremeity.          Assessment & Plan:

## 2013-01-21 NOTE — Patient Instructions (Addendum)
1. Your blood pressure is good today. You can go ahead to do the dental procedure now. Regarding the anti-platelet medications, you can stop taking Plavix and ASA 5 days before the procedure and restart at 24 to 48 hours after the procedure.  2. Please take all medications as prescribed.  3. If you have worsening of your symptoms or new symptoms arise, please call the clinic [phone]), or go to the ER immediately if symptoms are severe.  You have done great job in taking all your medications. I appreciate it very much. Please continue doing that.

## 2013-01-28 NOTE — Progress Notes (Signed)
Case discussed with Dr. Niu soon after the resident saw the patient.  We reviewed the resident's history and exam and pertinent patient test results.  I agree with the assessment, diagnosis, and plan of care documented in the resident's note. 

## 2013-02-02 ENCOUNTER — Other Ambulatory Visit: Payer: Self-pay | Admitting: Cardiology

## 2013-02-17 ENCOUNTER — Telehealth: Payer: Self-pay | Admitting: *Deleted

## 2013-02-17 NOTE — Telephone Encounter (Signed)
Pt called and can not afford Lipitor and spironolactone Rx's that were sent to [facility]. [facility] does carry Spironolactone, they do not have lipitor but can get Simvastatin 20 or 40 mg OR pravastatin 40 mg for $6.  Will you change to one of them?

## 2013-02-18 ENCOUNTER — Other Ambulatory Visit: Payer: Self-pay | Admitting: Internal Medicine

## 2013-02-18 DIAGNOSIS — I420 Dilated cardiomyopathy: Secondary | ICD-10-CM

## 2013-02-18 DIAGNOSIS — E785 Hyperlipidemia, unspecified: Secondary | ICD-10-CM

## 2013-02-18 MED ORDER — PRAVASTATIN SODIUM 40 MG PO TABS: 40.0000 mg | ORAL_TABLET | Freq: Every evening | ORAL | Status: DC

## 2013-02-18 MED ORDER — SPIRONOLACTONE 25 MG PO TABS: 25.0000 mg | ORAL_TABLET | Freq: Every day | ORAL | Status: DC

## 2013-02-18 NOTE — Telephone Encounter (Signed)
I will switch his Lipitor to pravastatin 40 mg daily. And give him a prescription of Spironolactone.   Ivor Costa, MD PGY3, Internal Medicine Teaching Service Pager: [phone]

## 2013-02-24 ENCOUNTER — Telehealth: Payer: Self-pay | Admitting: *Deleted

## 2013-02-24 NOTE — Telephone Encounter (Signed)
Pt called with question on his medication -  He gets meds from both [facility] and [facility].  I called in Rx for spironolactone to [facility].  Pt has been on Crestor 5 mg and can get that med for free at [facility].  Can you change from pravastatin to Crestor 5mg .  Also they can get accupril for free if you want to change from lisinopril. I called [facility] - pt has received lisinopril Rx on  12/30/12 # 90   I canceled Rx for spironolactone and pravastatin.   This is confusing, please call me if above is not clear.

## 2013-02-25 ENCOUNTER — Other Ambulatory Visit: Payer: Self-pay | Admitting: Internal Medicine

## 2013-02-25 DIAGNOSIS — I10 Essential (primary) hypertension: Secondary | ICD-10-CM

## 2013-02-25 DIAGNOSIS — E785 Hyperlipidemia, unspecified: Secondary | ICD-10-CM

## 2013-02-25 MED ORDER — ROSUVASTATIN CALCIUM 5 MG PO TABS: 5.0000 mg | ORAL_TABLET | Freq: Every day | ORAL | Status: DC

## 2013-02-25 MED ORDER — QUINAPRIL HCL 20 MG PO TABS: 20.0000 mg | ORAL_TABLET | Freq: Every day | ORAL | Status: DC

## 2013-02-25 NOTE — Progress Notes (Signed)
Since patient can get free Accupril and Crestor from [facility], will switch patient to equivalent dose of these two meds. Accupril 20 mg daily and Crestor 5 mg daily.   Ivor Costa, MD PGY3, Internal Medicine Teaching Service Pager: [phone]

## 2013-02-26 ENCOUNTER — Other Ambulatory Visit: Payer: Self-pay | Admitting: *Deleted

## 2013-02-26 NOTE — Telephone Encounter (Signed)
Rx received from Dr Blaine Hamper and faxed to [place] called [facility] and cancelled Rx for lisinopril.  Pt did receive a # 90 day supply on Aug. 12  -  [facility] informed and will hold  Accupril until pt is out of lisinopril.

## 2013-02-26 NOTE — Telephone Encounter (Signed)
Rx's faxed.

## 2013-03-07 ENCOUNTER — Other Ambulatory Visit: Payer: Self-pay | Admitting: Internal Medicine

## 2013-03-07 DIAGNOSIS — I639 Cerebral infarction, unspecified: Secondary | ICD-10-CM

## 2013-03-10 ENCOUNTER — Other Ambulatory Visit: Payer: Self-pay | Admitting: Cardiology

## 2013-03-12 ENCOUNTER — Telehealth: Payer: Self-pay | Admitting: *Deleted

## 2013-03-12 NOTE — Telephone Encounter (Signed)
Pt was given accupril 10mg  daily #90, that should last him until 12/21. Do you want him on lisinopril or accupril, i see you stopped the accupril in august at a visit but no one called the health dept to stop the accupril. Please advise

## 2013-03-12 NOTE — Telephone Encounter (Signed)
Patient can take either of these two medications. Because of financial reason, he keeps changing his medications between Accupril and lisinopril. He only needs one of these two. Any of them is fine, but do not overlap.   Ivor Costa, MD PGY3, Internal Medicine Teaching Service Pager: [phone]

## 2013-04-28 ENCOUNTER — Ambulatory Visit (INDEPENDENT_AMBULATORY_CARE_PROVIDER_SITE_OTHER): Payer: No Typology Code available for payment source | Admitting: Cardiology

## 2013-04-28 ENCOUNTER — Encounter: Payer: Self-pay | Admitting: Cardiology

## 2013-04-28 ENCOUNTER — Encounter: Payer: Self-pay | Admitting: *Deleted

## 2013-04-28 VITALS — BP 169/99 | HR 97 | Ht 72.0 in | Wt 211.8 lb

## 2013-04-28 DIAGNOSIS — I10 Essential (primary) hypertension: Secondary | ICD-10-CM

## 2013-04-28 DIAGNOSIS — I6350 Cerebral infarction due to unspecified occlusion or stenosis of unspecified cerebral artery: Secondary | ICD-10-CM

## 2013-04-28 DIAGNOSIS — I428 Other cardiomyopathies: Secondary | ICD-10-CM

## 2013-04-28 DIAGNOSIS — I2510 Atherosclerotic heart disease of native coronary artery without angina pectoris: Secondary | ICD-10-CM

## 2013-04-28 DIAGNOSIS — I420 Dilated cardiomyopathy: Secondary | ICD-10-CM

## 2013-04-28 DIAGNOSIS — I639 Cerebral infarction, unspecified: Secondary | ICD-10-CM

## 2013-04-28 MED ORDER — CARVEDILOL 25 MG PO TABS: 25.0000 mg | ORAL_TABLET | Freq: Two times a day (BID) | ORAL | Status: DC

## 2013-04-28 NOTE — Progress Notes (Signed)
HPI The patient presents for follow up of CAD.  Since I last saw him he has done well. The patient denies any new symptoms such as chest discomfort, neck or arm discomfort. There has been no new shortness of breath, PND or orthopnea. There have been no reported palpitations, presyncope or syncope.  He has not recovered completely from a stroke.  However, [facility] and doing other activities almost daily. With this he denies any cardiovascular symptoms. He does have difficulty with his gait and speech.  Allergies  Allergen Reactions  . Clonidine Derivatives     Dizzy, too sleepy    Current Outpatient Prescriptions  Medication Sig Dispense Refill  . aspirin EC 81 MG tablet Take 1 tablet (81 mg total) by mouth daily.      . carvedilol (COREG) 25 MG tablet TAKE ONE TABLET BY MOUTH TWICE DAILY WITH  A  MEAL  60 tablet  0  . clopidogrel (PLAVIX) 75 MG tablet TAKE 1 TABLET BY MOUTH ONCE A DAY  30 tablet  5  . hydrochlorothiazide (MICROZIDE) 12.5 MG capsule TAKE TWO CAPSULES BY MOUTH EVERY DAY  90 capsule  3  . metFORMIN (GLUCOPHAGE) 1000 MG tablet TAKE ONE TABLET BY MOUTH TWICE DAILY WITH  A  MEAL  60 tablet  5  . nitroGLYCERIN (NITROSTAT) 0.4 MG SL tablet Place 1 tablet (0.4 mg total) under the tongue every 5 (five) minutes as needed.  100 tablet  1  . quinapril (ACCUPRIL) 20 MG tablet Take 1 tablet (20 mg total) by mouth daily.  30 tablet  11  . rosuvastatin (CRESTOR) 5 MG tablet Take 1 tablet (5 mg total) by mouth at bedtime.  30 tablet  11  . spironolactone (ALDACTONE) 25 MG tablet Take 1 tablet (25 mg total) by mouth daily.  30 tablet  11   No current facility-administered medications for this visit.    Past Medical History  Diagnosis Date  . Diabetes mellitus   . Hypertension   . Macular hemorrhage   . Dyslipidemia   . Ischemic cardiomyopathy     echo 7/12: EF 40-45%, septal, apical, inf basal HK, mod LVH, mild MR, mild LAE  . Anemia   . CAD  (coronary artery disease)     cath 8/12:  LM ok, mLAD occluded with trivial collats from right AM and dRCA, mDx (small) 80%, RI 25%, tiny branch of OM 90%, mAM 30-40%, RCA sub-totally occluded, then 80%, EF 40%, inf HK.  He underwent PCI with [name] with placement of a Promus DES to the mRCA x 2  . History of stroke     evidence of CVA on MRI in past  . History of alcohol abuse   . Vision, loss, sudden 02/27/2011  . Gait instability 02/27/2011  . Sleep apnea   . Hyperlipidemia   . Stroke 2012    uses cane to steady due to dizziness    Past Surgical History  Procedure Laterality Date  . Coronary angioplasty with stent placement  2012  . Refractive surgery  2012    right and left eye    ROS:  As stated in the HPI and negative for all other systems.  PHYSICAL EXAM BP 169/99  Pulse 97  Ht 6' (1.829 m)  Wt 211 lb 12.8 oz (96.072 kg)  BMI 28.72 kg/m2 GENERAL:  Well appearing HEENT:  Pupils equal round and reactive, fundi not visualized, oral mucosa unremarkable NECK:  No jugular  venous distention, waveform within normal limits, carotid upstroke brisk and symmetric, no bruits, no thyromegaly LYMPHATICS:  No cervical, inguinal adenopathy LUNGS:  Clear to auscultation bilaterally BACK:  No CVA tenderness CHEST:  Unremarkable HEART:  PMI not displaced or sustained,S1 and S2 within normal limits, no S3, no clicks, no rubs, no , positive S4 ABD:  Flat, positive bowel sounds normal in frequency in pitch, no bruits, no rebound, no guarding, no midline pulsatile mass, no hepatomegaly, no splenomegaly EXT:  2 plus pulses throughout, no edema, no cyanosis no clubbing SKIN:  No rashes no nodules NEURO:  Cranial nerves II through XII grossly intact, motor diffuse weakness PSYCH:  Cognitively intact, oriented to person place and time, halting speech.   EKG:  Sinus rhythm, rate 97, axis within normal limits, intervals within normal limits, inferolateral T wave inversions unchanged from  previous EKGs 04/28/2013  ASSESSMENT AND PLAN  CAD:  The patient has no new sypmtoms.  No further cardiovascular testing is indicated.  We will continue with aggressive risk reduction and meds as listed.  CARDIOMYOPATHY:  I will repeat an echocardiogram to evaluate ejection fraction which was mildly reduced in the past.  HTN:  His blood pressure is elevated. However, he hasn't been taking his beta blocker. I renewed this and other medications.

## 2013-04-28 NOTE — Patient Instructions (Signed)

## 2013-04-30 ENCOUNTER — Telehealth: Payer: Self-pay | Admitting: Cardiology

## 2013-04-30 NOTE — Telephone Encounter (Signed)
Pt return a called. The only think i see was that pt  is scheduled for an echocardiogram on 12/30 th 2014 at 4:00 Pm. Pt aware he states" They supposed to call me for that" pt  verbalized understanding.

## 2013-04-30 NOTE — Telephone Encounter (Signed)
New message    Returning a nurses call---don't know who or why they called

## 2013-05-04 ENCOUNTER — Other Ambulatory Visit: Payer: Self-pay | Admitting: Cardiology

## 2013-05-07 ENCOUNTER — Telehealth: Payer: Self-pay | Admitting: Oncology

## 2013-05-07 NOTE — Telephone Encounter (Signed)
lvm for pt regarding to 1.22.15 appt r/s to 2.5.15 per MD request on pal...mailed pt appt sche///avs and letter

## 2013-05-19 ENCOUNTER — Ambulatory Visit (HOSPITAL_COMMUNITY): Payer: No Typology Code available for payment source | Attending: Cardiovascular Disease | Admitting: Radiology

## 2013-05-19 ENCOUNTER — Encounter: Payer: Self-pay | Admitting: Cardiovascular Disease

## 2013-05-19 DIAGNOSIS — I428 Other cardiomyopathies: Secondary | ICD-10-CM | POA: Insufficient documentation

## 2013-05-19 DIAGNOSIS — Z8673 Personal history of transient ischemic attack (TIA), and cerebral infarction without residual deficits: Secondary | ICD-10-CM | POA: Insufficient documentation

## 2013-05-19 DIAGNOSIS — G4730 Sleep apnea, unspecified: Secondary | ICD-10-CM | POA: Insufficient documentation

## 2013-05-19 DIAGNOSIS — I2510 Atherosclerotic heart disease of native coronary artery without angina pectoris: Secondary | ICD-10-CM | POA: Insufficient documentation

## 2013-05-19 DIAGNOSIS — I079 Rheumatic tricuspid valve disease, unspecified: Secondary | ICD-10-CM | POA: Insufficient documentation

## 2013-05-19 DIAGNOSIS — I25810 Atherosclerosis of coronary artery bypass graft(s) without angina pectoris: Secondary | ICD-10-CM | POA: Insufficient documentation

## 2013-05-19 DIAGNOSIS — I420 Dilated cardiomyopathy: Secondary | ICD-10-CM

## 2013-05-19 DIAGNOSIS — I379 Nonrheumatic pulmonary valve disorder, unspecified: Secondary | ICD-10-CM | POA: Insufficient documentation

## 2013-05-19 DIAGNOSIS — E785 Hyperlipidemia, unspecified: Secondary | ICD-10-CM | POA: Insufficient documentation

## 2013-05-19 DIAGNOSIS — E119 Type 2 diabetes mellitus without complications: Secondary | ICD-10-CM | POA: Insufficient documentation

## 2013-05-19 DIAGNOSIS — I059 Rheumatic mitral valve disease, unspecified: Secondary | ICD-10-CM | POA: Insufficient documentation

## 2013-05-19 DIAGNOSIS — I10 Essential (primary) hypertension: Secondary | ICD-10-CM | POA: Insufficient documentation

## 2013-05-19 NOTE — Progress Notes (Signed)
Echocardiogram performed.  

## 2013-06-10 ENCOUNTER — Ambulatory Visit: Payer: Self-pay | Admitting: Internal Medicine

## 2013-06-11 ENCOUNTER — Other Ambulatory Visit: Payer: Medicaid Other

## 2013-06-11 ENCOUNTER — Ambulatory Visit: Payer: Medicaid Other | Admitting: Oncology

## 2013-06-12 ENCOUNTER — Ambulatory Visit (INDEPENDENT_AMBULATORY_CARE_PROVIDER_SITE_OTHER): Payer: Self-pay | Admitting: Internal Medicine

## 2013-06-12 ENCOUNTER — Ambulatory Visit: Payer: Self-pay | Admitting: Internal Medicine

## 2013-06-12 ENCOUNTER — Encounter (INDEPENDENT_AMBULATORY_CARE_PROVIDER_SITE_OTHER): Payer: Self-pay

## 2013-06-12 ENCOUNTER — Encounter: Payer: Self-pay | Admitting: Internal Medicine

## 2013-06-12 VITALS — BP 152/86 | HR 84 | Temp 98.0°F | Ht 72.0 in | Wt 212.5 lb

## 2013-06-12 DIAGNOSIS — E1142 Type 2 diabetes mellitus with diabetic polyneuropathy: Secondary | ICD-10-CM

## 2013-06-12 DIAGNOSIS — E1140 Type 2 diabetes mellitus with diabetic neuropathy, unspecified: Secondary | ICD-10-CM

## 2013-06-12 DIAGNOSIS — E1159 Type 2 diabetes mellitus with other circulatory complications: Secondary | ICD-10-CM

## 2013-06-12 DIAGNOSIS — I798 Other disorders of arteries, arterioles and capillaries in diseases classified elsewhere: Secondary | ICD-10-CM

## 2013-06-12 DIAGNOSIS — E1151 Type 2 diabetes mellitus with diabetic peripheral angiopathy without gangrene: Secondary | ICD-10-CM

## 2013-06-12 DIAGNOSIS — E1149 Type 2 diabetes mellitus with other diabetic neurological complication: Secondary | ICD-10-CM

## 2013-06-12 DIAGNOSIS — I420 Dilated cardiomyopathy: Secondary | ICD-10-CM

## 2013-06-12 LAB — GLUCOSE, CAPILLARY: GLUCOSE-CAPILLARY: 154 mg/dL — AB (ref 70–99)

## 2013-06-12 LAB — POCT GLYCOSYLATED HEMOGLOBIN (HGB A1C): HEMOGLOBIN A1C: 7

## 2013-06-12 MED ORDER — GLIPIZIDE 5 MG PO TABS: 5.0000 mg | ORAL_TABLET | Freq: Every day | ORAL | Status: DC

## 2013-06-12 NOTE — Progress Notes (Signed)
Subjective:   Patient ID: Kevin Soto male    DOB: [birth year] 53 y.o.    MRN: [id number]  ____________________________________  HPI: [name] is a 53 y.o. male here for an acute visit.  Pt has a PMH outlined below.  Please see problem-based assessment and plan for further details of medical issues addressed at today's visit.   PMH: Past Medical History  Diagnosis Date  . Diabetes mellitus   . Hypertension   . Macular hemorrhage   . Dyslipidemia   . Ischemic cardiomyopathy     echo 7/12: EF 40-45%, septal, apical, inf basal HK, mod LVH, mild MR, mild LAE  . Anemia   . CAD (coronary artery disease)     cath 8/12:  LM ok, mLAD occluded with trivial collats from right AM and dRCA, mDx (small) 80%, RI 25%, tiny branch of OM 90%, mAM 30-40%, RCA sub-totally occluded, then 80%, EF 40%, inf HK.  He underwent PCI with [name] with placement of a Promus DES to the mRCA x 2  . History of stroke     evidence of CVA on MRI in past  . History of alcohol abuse   . Vision, loss, sudden 02/27/2011  . Gait instability 02/27/2011  . Sleep apnea   . Hyperlipidemia   . Stroke 2012    uses cane to steady due to dizziness    Medications:  (Not in a hospital admission)  Allergies: Allergies  Allergen Reactions  . Clonidine Derivatives     Dizzy, too sleepy    FH: Family History  Problem Relation Age of Onset  . Adopted: Yes  . Heart attack Mother     Died 61  . Heart failure Mother   . Diabetes Mother   . Heart attack Father   . Heart failure Father   . Diabetes Father   . Heart disease Sister     s/p CABG x3 in her mid 57's  . Prostate cancer Father   . Crohn's disease Sister   . Crohn's disease Other     niece  . Kidney disease Brother     SH: History   Social History  . Marital Status: Single    Spouse Name: N/A    Number of Children: 0  . Years of Education: N/A   Occupational History  . disabled    Social History Main Topics  . Smoking  status: Never Smoker   . Smokeless tobacco: Never Used  . Alcohol Use: 2.4 oz/week    2 Cans of beer, 2 Shots of liquor per week  . Drug Use: No  . Sexual Activity: Yes   Other Topics Concern  . None   Social History Narrative   Lives in [place] with his cousin   No children   Exercises 3 times a week    Review of Systems: Constitutional: Denies fever/chills, diaphoresis, unintentional weight loss, appetite change or fatigue.  Respiratory: Denies SOB (at rest or with exertion), cough, chest tightness, and wheezing.   Cardiovascular: Denies CP, palpitations and leg swelling.  Gastrointestinal: Denies N/V/D/C, abdominal pain, and blood/changes in stool. Genitourinary: Denies dysuria, urgency, frequency, hematuria, and difficulty urinating.  Endocrine: Denies: hot/cold intolerance, polyuria, polyphagia, polydipsia. Skin: Denies rashes or wounds.  Neurological: Denies dizziness/light-headedness, syncope, numbness and headaches.  Hematological: Denies adenopathy or easy bruising. Psychiatric/Behavioral: Denies mood changes, sleep disturbance.   Objective:   Vital Signs: Filed Vitals:   06/12/13 0940  BP: 152/86  Pulse: 84  Temp: 98 F (  36.7 C)  TempSrc: Oral  Height: 6' (1.829 m)  Weight: 212 lb 8 oz (96.389 kg)  SpO2: 98%      BP Readings from Last 3 Encounters:  06/12/13 152/86  04/28/13 169/99  01/21/13 141/85    Physical Exam: Constitutional: Vital signs reviewed.  Patient is well-developed and well-nourished in NAD and cooperative with exam.  Head: Normocephalic and atraumatic. Eyes: PERRL, EOMI, conjunctivae nl, no scleral icterus.  Neck: Supple, Trachea midline, no JVD appreciated. Cardiovascular: RRR, no MRG, pulses symmetric and intact b/l. Pulmonary/Chest: normal respiratory effort, non-tender to palpation, CTAB, no wheezes, rales, or rhonchi. Abdominal: Soft. NT/ND +BS. Neurological: A&O x3, cranial nerves II-XII are grossly intact, moving all  extremities; decreased sensation b/l feet. Extremities: 2+DP b/l; no pitting edema; b/l soles of feet are boggy. Skin: Warm, dry and intact. No rash, cyanosis, or clubbing.  Psychiatric: Normal mood.   Most Recent Laboratory Results:  CMP     Component Value Date/Time   NA 137 12/30/2012 1538   NA 139 12/09/2012 1035   K 4.5 12/30/2012 1538   K 4.3 12/09/2012 1035   CL 102 12/30/2012 1538   CL 105 08/07/2012 1059   CO2 25 12/30/2012 1538   CO2 28 12/09/2012 1035   GLUCOSE 195* 12/30/2012 1538   GLUCOSE 226* 12/09/2012 1035   GLUCOSE 261* 08/07/2012 1059   BUN 34* 12/30/2012 1538   BUN 19.1 12/09/2012 1035   CREATININE 1.69* 12/30/2012 1538   CREATININE 1.5* 12/09/2012 1035   CREATININE 1.11 02/27/2011 1723   CALCIUM 10.4 12/30/2012 1538   CALCIUM 9.6 12/09/2012 1035   PROT 7.5 12/09/2012 1035   PROT 7.9 02/27/2011 1723   ALBUMIN 3.4* 12/09/2012 1035   ALBUMIN 3.6 02/27/2011 1723   AST 12 12/09/2012 1035   AST 11 02/27/2011 1723   ALT 10 12/09/2012 1035   ALT 11 02/27/2011 1723   ALKPHOS 57 12/09/2012 1035   ALKPHOS 65 02/27/2011 1723   BILITOT 0.30 12/09/2012 1035   BILITOT 0.3 02/27/2011 1723   GFRNONAA 75* 02/27/2011 1723   GFRAA 87* 02/27/2011 1723    CBC    Component Value Date/Time   WBC 11.3* 12/09/2012 1035   WBC 9.1 06/24/2012 1522   RBC 3.64* 12/09/2012 1035   RBC 3.31* 06/24/2012 1522   RBC 3.31* 06/24/2012 1522   HGB 10.6* 12/09/2012 1035   HGB 9.5* 06/24/2012 1522   HCT 32.3* 12/09/2012 1035   HCT 28.5* 06/24/2012 1522   PLT 313 12/09/2012 1035   PLT 355 06/24/2012 1522   MCV 88.8 12/09/2012 1035   MCV 86.1 06/24/2012 1522   MCH 29.0 12/09/2012 1035   MCH 28.7 06/24/2012 1522   MCHC 32.7 12/09/2012 1035   MCHC 33.3 06/24/2012 1522   RDW 13.8 12/09/2012 1035   RDW 15.4 06/24/2012 1522   LYMPHSABS 2.0 12/09/2012 1035   LYMPHSABS 2.3 06/24/2012 1522   MONOABS 0.9 12/09/2012 1035   MONOABS 0.9 06/24/2012 1522   EOSABS 0.8* 12/09/2012 1035   EOSABS 0.5 06/24/2012 1522   BASOSABS 0.1 12/09/2012 1035    BASOSABS 0.0 06/24/2012 1522    Lipid Panel Lab Results  Component Value Date   CHOL 157 06/24/2012   HDL 52 06/24/2012   LDLCALC 89 06/24/2012   TRIG 79 06/24/2012   CHOLHDL 3.0 06/24/2012    HA1C Lab Results  Component Value Date   HGBA1C 6.5 12/30/2012    Urinalysis    Component Value Date/Time   COLORURINE YELLOW 02/27/2011 1756  APPEARANCEUR CLEAR 02/27/2011 1756   LABSPEC 1.023 02/27/2011 1756   PHURINE 5.0 02/27/2011 1756   GLUCOSEU NEGATIVE 02/27/2011 1756   HGBUR NEGATIVE 02/27/2011 1756   BILIRUBINUR NEGATIVE 02/27/2011 1756   KETONESUR NEGATIVE 02/27/2011 1756   PROTEINUR 30* 02/27/2011 1756   UROBILINOGEN 0.2 02/27/2011 1756   NITRITE NEGATIVE 02/27/2011 1756   LEUKOCYTESUR NEGATIVE 02/27/2011 1756    Urine Microalbumin No results found for this basename: MICROALBUR, [id number]    Imaging N/A  Assessment & Plan:   Assessment and plan was discussed and formulated with my attending.

## 2013-06-12 NOTE — Patient Instructions (Addendum)
Thank you for your visit today. Please return to the internal medicine clinic in 3 months or sooner if needed.    Please discontinue taking metformin.  Please take glipizide 5mg  daily instead.  I have sent this to your pharmacy. Please keep a log of your fasting blood sugars and call results to [facility] in one week.   If you believe that you are suffering from a life threatening condition or one that may result in the loss of limb or function, then you should call 911 or proceed to the nearest Emergency Department.

## 2013-06-13 DIAGNOSIS — E1140 Type 2 diabetes mellitus with diabetic neuropathy, unspecified: Secondary | ICD-10-CM | POA: Insufficient documentation

## 2013-06-13 NOTE — Assessment & Plan Note (Addendum)
Lab Results  Component Value Date   HGBA1C 7.0 06/12/2013   HGBA1C 6.5 12/30/2012   HGBA1C 6.1 06/24/2012    Lab Results  Component Value Date   CREATININE 1.69* 12/30/2012    Assessment: Diabetes control: good control (HgbA1C at goal) Progress toward A1C goal:  at goal Comments: Cr is above recommended level to continue metformin  Plan: Medications:  d/c metformin; begin glipizide 5mg  daily Home glucose monitoring: Frequency:  once daily Timing:  fasting Instruction/counseling given: reminded to bring medications to each visit

## 2013-06-13 NOTE — Assessment & Plan Note (Addendum)
Pt comes in with a 2 week h/o bilateral foot swelling limited to the soles.  He denies any associated pain, trauma, or decreased sensation.  On exam, bilateral soles are boggy to palpation without erythema, deformity, or tenderness.  DP 1+ b/l.  He has decreased sensation b/l on monofilament testing.  Pt had B12, TSH checked in the past and were wnl.  He denies any current alcohol use, but used to drink heavily which could possibly also be contributing.  No known exposures to heavy metals.  Medications are another possibility but upon quick review, do not see any major contributors.  -monitor for now -continue excellent control of blood sugars -inspect feet daily -consider rechecking TSH, B12

## 2013-06-18 ENCOUNTER — Ambulatory Visit: Payer: No Typology Code available for payment source

## 2013-06-22 NOTE — Progress Notes (Signed)
I saw and evaluated the patient.  I personally confirmed the key portions of the history and exam documented by Dr. Gordy Levan and I reviewed pertinent patient test results.  The assessment, diagnosis, and plan were formulated together and I agree with the documentation in the resident's note. The skin on his feet are almost of a plastic quality - but this is old. There is no edema that I can appreciate. As Dr Gordy Levan documented, there is decreased monofilament testing. I believe that the edema he feels when he stands (and only when he stands) is decreased sensation 2/2 DM neuropathy.

## 2013-06-23 ENCOUNTER — Telehealth: Payer: Self-pay | Admitting: Dietician

## 2013-06-23 NOTE — Telephone Encounter (Signed)
Blood sugars were dropped off for 5 day period: 06/13/13 (8:20 AM) 209 06/14/13 (9:00AM) 203 06/15/13   (11:50 AM) 159 06/16/13 (7:50 AM) 211 06/17/13 (7:46 AM) 135  Called patient, reviewed meal plan and encouraged regularly timed low sodium meals and snacks if needed and signs, symptoms prevention and treatment of hypoglycemia. Encouraged him to maintain current weight. He is checking his blood sugar ~ 3 times a week, Could consider having him collect a weekly glucose profile (breakfast/lunch/dinner and bedtime) instead and then check as needed on other days

## 2013-06-25 ENCOUNTER — Other Ambulatory Visit (HOSPITAL_BASED_OUTPATIENT_CLINIC_OR_DEPARTMENT_OTHER): Payer: No Typology Code available for payment source

## 2013-06-25 ENCOUNTER — Encounter: Payer: Self-pay | Admitting: Oncology

## 2013-06-25 ENCOUNTER — Ambulatory Visit (HOSPITAL_BASED_OUTPATIENT_CLINIC_OR_DEPARTMENT_OTHER): Payer: No Typology Code available for payment source | Admitting: Oncology

## 2013-06-25 VITALS — BP 171/83 | HR 77 | Temp 97.3°F | Resp 19 | Ht 72.0 in | Wt 215.5 lb

## 2013-06-25 DIAGNOSIS — D649 Anemia, unspecified: Secondary | ICD-10-CM

## 2013-06-25 LAB — CBC WITH DIFFERENTIAL/PLATELET
BASO%: 0.7 % (ref 0.0–2.0)
Basophils Absolute: 0.1 10*3/uL (ref 0.0–0.1)
EOS%: 5.4 % (ref 0.0–7.0)
Eosinophils Absolute: 0.6 10*3/uL — ABNORMAL HIGH (ref 0.0–0.5)
HCT: 31.1 % — ABNORMAL LOW (ref 38.4–49.9)
HGB: 10.1 g/dL — ABNORMAL LOW (ref 13.0–17.1)
LYMPH#: 2.6 10*3/uL (ref 0.9–3.3)
LYMPH%: 24.6 % (ref 14.0–49.0)
MCH: 28.5 pg (ref 27.2–33.4)
MCHC: 32.6 g/dL (ref 32.0–36.0)
MCV: 87.6 fL (ref 79.3–98.0)
MONO#: 1 10*3/uL — ABNORMAL HIGH (ref 0.1–0.9)
MONO%: 9.4 % (ref 0.0–14.0)
NEUT#: 6.3 10*3/uL (ref 1.5–6.5)
NEUT%: 59.9 % (ref 39.0–75.0)
PLATELETS: 322 10*3/uL (ref 140–400)
RBC: 3.55 10*6/uL — ABNORMAL LOW (ref 4.20–5.82)
RDW: 14.5 % (ref 11.0–14.6)
WBC: 10.6 10*3/uL — AB (ref 4.0–10.3)

## 2013-06-25 LAB — COMPREHENSIVE METABOLIC PANEL (CC13)
ALT: 40 U/L (ref 0–55)
ANION GAP: 10 meq/L (ref 3–11)
AST: 20 U/L (ref 5–34)
Albumin: 3.8 g/dL (ref 3.5–5.0)
Alkaline Phosphatase: 63 U/L (ref 40–150)
BILIRUBIN TOTAL: 0.28 mg/dL (ref 0.20–1.20)
BUN: 21.3 mg/dL (ref 7.0–26.0)
CO2: 28 meq/L (ref 22–29)
Calcium: 9.8 mg/dL (ref 8.4–10.4)
Chloride: 106 mEq/L (ref 98–109)
Creatinine: 1.7 mg/dL — ABNORMAL HIGH (ref 0.7–1.3)
Glucose: 131 mg/dl (ref 70–140)
POTASSIUM: 4.5 meq/L (ref 3.5–5.1)
SODIUM: 144 meq/L (ref 136–145)
TOTAL PROTEIN: 7.4 g/dL (ref 6.4–8.3)

## 2013-06-25 NOTE — Progress Notes (Signed)
Hematology and Oncology Follow Up Visit  Kevin Soto [id number] Mar 12, 1961 53 y.o. 06/25/2013 2:55 PM   Principle Diagnosis: A 53 year old gentleman with normocytic, normochromic anemia likely multifactorial diagnosed in in 07/2012. He has an element of chronic disease and anemia of renal disease.    Current therapy: Observation and follow up.   Interim History: Kevin Soto presents for a follow up visit. He is a nice man with the above issues. Since his last visit, he reports doing well. He has no new problems. Kevin Soto reports really no major changes in his performance status or activity level. He is limited by his recent stroke. He is mobile, he does not use a walker or wheelchair, but he is very slow in his mobility. He Has not reported any weakness, has not reported any fatigue, has not reported any excessive shortness of breath, has not reported any chest pain. Still continues to perform some of his activities of daily living. He does not drive. He had not reported any epistaxis, had not reported any hematochezia, had not reported any genitourinary bleeding.    Medications: I have reviewed the patient's current medications.  Current Outpatient Prescriptions  Medication Sig Dispense Refill  . aspirin EC 81 MG tablet Take 1 tablet (81 mg total) by mouth daily.      . carvedilol (COREG) 25 MG tablet Take 1 tablet (25 mg total) by mouth 2 (two) times daily with a meal.  60 tablet  11  . clopidogrel (PLAVIX) 75 MG tablet TAKE 1 TABLET BY MOUTH ONCE A DAY  30 tablet  5  . glipiZIDE (GLUCOTROL) 5 MG tablet Take 1 tablet (5 mg total) by mouth daily.  30 tablet  3  . hydrochlorothiazide (MICROZIDE) 12.5 MG capsule TAKE TWO CAPSULES BY MOUTH EVERY DAY  90 capsule  3  . nitroGLYCERIN (NITROSTAT) 0.4 MG SL tablet Place 1 tablet (0.4 mg total) under the tongue every 5 (five) minutes as needed.  100 tablet  1  . quinapril (ACCUPRIL) 20 MG tablet Take 1 tablet (20 mg total) by mouth daily.  30  tablet  11  . rosuvastatin (CRESTOR) 5 MG tablet Take 1 tablet (5 mg total) by mouth at bedtime.  30 tablet  11  . spironolactone (ALDACTONE) 25 MG tablet Take 1 tablet (25 mg total) by mouth daily.  30 tablet  11   No current facility-administered medications for this visit.     Allergies:  Allergies  Allergen Reactions  . Clonidine Derivatives     Dizzy, too sleepy    Past Medical History, Surgical history, Social history, and Family History were reviewed and updated.  Review of Systems: Constitutional:  Negative for fever, chills, night sweats, anorexia, weight loss, pain. Cardiovascular: no chest pain or dyspnea on exertion  Remaining ROS negative. Physical Exam: Blood pressure 171/83, pulse 77, temperature 97.3 F (36.3 C), temperature source Oral, resp. rate 19, height 6' (1.829 m), weight 215 lb 8 oz (97.75 kg). ECOG: 1 General appearance: alert Head: Normocephalic, without obvious abnormality, atraumatic Neck: no adenopathy, no carotid bruit, no JVD, supple, symmetrical, trachea midline and thyroid not enlarged, symmetric, no tenderness/mass/nodules Lymph nodes: Cervical, supraclavicular, and axillary nodes normal. Heart:regular rate and rhythm, S1, S2 normal, no murmur, click, rub or gallop Lung:chest clear, no wheezing, rales, normal symmetric air entry Abdomin: soft, non-tender, without masses or organomegaly EXT:no erythema, induration, or nodules   Lab Results: Lab Results  Component Value Date   WBC 10.6* 06/25/2013   HGB 10.1*  06/25/2013   HCT 31.1* 06/25/2013   MCV 87.6 06/25/2013   PLT 322 06/25/2013     Chemistry      Component Value Date/Time   NA 137 12/30/2012 1538   NA 139 12/09/2012 1035   K 4.5 12/30/2012 1538   K 4.3 12/09/2012 1035   CL 102 12/30/2012 1538   CL 105 08/07/2012 1059   CO2 25 12/30/2012 1538   CO2 28 12/09/2012 1035   BUN 34* 12/30/2012 1538   BUN 19.1 12/09/2012 1035   CREATININE 1.69* 12/30/2012 1538   CREATININE 1.5* 12/09/2012 1035    CREATININE 1.11 02/27/2011 1723      Component Value Date/Time   CALCIUM 10.4 12/30/2012 1538   CALCIUM 9.6 12/09/2012 1035   ALKPHOS 57 12/09/2012 1035   ALKPHOS 65 02/27/2011 1723   AST 12 12/09/2012 1035   AST 11 02/27/2011 1723   ALT 10 12/09/2012 1035   ALT 11 02/27/2011 1723   BILITOT 0.30 12/09/2012 1035   BILITOT 0.3 02/27/2011 1723       Impression and Plan:  A 53 year old gentleman with normocytic, normochromic anemia. The differential diagnosis include multifactorial in nature. His hemoglobin continued to improve 1210 at this time. Anemia of chronic disease is probably a big factor in him, he has had multiple co-morbid conditions, as well as history of cerebrovascular accident and coronary artery disease. Renal disease anemia is also a possibility. His estimated glomerular filtration rate is around 60 cc/minute with EPO level that is inappropriately normal.  His SPEP was normal as well.   The plan is to continue observation for now and one can consider EPO supplement if his Hgb drops again. It appears that his number is improving.   I will repeat his laboratory data and followup in 6 months.     Zola Button, MD 2/5/20152:55 PM

## 2013-08-24 ENCOUNTER — Other Ambulatory Visit: Payer: Self-pay | Admitting: Internal Medicine

## 2013-08-24 DIAGNOSIS — I10 Essential (primary) hypertension: Secondary | ICD-10-CM

## 2013-08-28 ENCOUNTER — Encounter: Payer: Self-pay | Admitting: Internal Medicine

## 2013-08-28 ENCOUNTER — Ambulatory Visit (INDEPENDENT_AMBULATORY_CARE_PROVIDER_SITE_OTHER): Payer: No Typology Code available for payment source | Admitting: Internal Medicine

## 2013-08-28 VITALS — BP 144/78 | HR 74 | Temp 97.8°F | Ht 72.0 in | Wt 227.3 lb

## 2013-08-28 DIAGNOSIS — E1149 Type 2 diabetes mellitus with other diabetic neurological complication: Secondary | ICD-10-CM

## 2013-08-28 DIAGNOSIS — E785 Hyperlipidemia, unspecified: Secondary | ICD-10-CM

## 2013-08-28 DIAGNOSIS — I10 Essential (primary) hypertension: Secondary | ICD-10-CM

## 2013-08-28 DIAGNOSIS — E1142 Type 2 diabetes mellitus with diabetic polyneuropathy: Secondary | ICD-10-CM

## 2013-08-28 DIAGNOSIS — E1140 Type 2 diabetes mellitus with diabetic neuropathy, unspecified: Secondary | ICD-10-CM

## 2013-08-28 LAB — GLUCOSE, CAPILLARY: Glucose-Capillary: 171 mg/dL — ABNORMAL HIGH (ref 70–99)

## 2013-08-28 LAB — LIPID PANEL
Cholesterol: 146 mg/dL (ref 0–200)
HDL: 47 mg/dL (ref >39)
LDL CALC: 91 mg/dL (ref 0–99)
TRIGLYCERIDES: 41 mg/dL (ref <150)
Total CHOL/HDL Ratio: 3.1 Ratio
VLDL: 8 mg/dL (ref 0–40)

## 2013-08-28 LAB — POCT GLYCOSYLATED HEMOGLOBIN (HGB A1C): Hemoglobin A1C: 6.6

## 2013-08-28 NOTE — Assessment & Plan Note (Addendum)
BP well controlled on current regimen. Likely was elevated due to allergy medicine with phenylephrine. D/C allergy meds with vasoconstrictors. Rec OTC Zyrtec prn allergies.  Follow up with PCP in 203 months.

## 2013-08-28 NOTE — Assessment & Plan Note (Signed)
Well controlled on new meds. Hg a1c at 6.6. Plan to continue current management. F/U with PCP in 2-3 months.

## 2013-08-28 NOTE — Patient Instructions (Signed)
Please do not take allergy medicine with phenylephrine in it. It makes your blood pressure go up.  Follow up with PCP in 2-3 months.

## 2013-08-28 NOTE — Assessment & Plan Note (Signed)
Checking lipid panel today. Will adjust statin as indicated.

## 2013-08-28 NOTE — Progress Notes (Signed)
Patient ID: Kevin Soto, male   DOB: 1960-09-24, 53 y.o.   MRN: [id number]    Subjective:   Patient ID: Kevin Soto male   DOB: 11/05/1960 53 y.o.   MRN: [id number]  HPI: Kevin Soto is a 53 y.o. man who comes in with acc of increased BP. He has seen SBP 160-170 for the last week. He also has been complaining of mild allergies (seasonal) that he has been taking OTC allergy medicine containing phenylephrine. He stopped taking that medicine two days ago. Today his BP is essentially at baseline.     Past Medical History  Diagnosis Date  . Diabetes mellitus   . Hypertension   . Macular hemorrhage   . Dyslipidemia   . Ischemic cardiomyopathy     echo 7/12: EF 40-45%, septal, apical, inf basal HK, mod LVH, mild MR, mild LAE  . Anemia   . CAD (coronary artery disease)     cath 8/12:  LM ok, mLAD occluded with trivial collats from right AM and dRCA, mDx (small) 80%, RI 25%, tiny branch of OM 90%, mAM 30-40%, RCA sub-totally occluded, then 80%, EF 40%, inf HK.  He underwent PCI with [name] with placement of a Promus DES to the mRCA x 2  . History of stroke     evidence of CVA on MRI in past  . History of alcohol abuse   . Vision, loss, sudden 02/27/2011  . Gait instability 02/27/2011  . Sleep apnea   . Hyperlipidemia   . Stroke 2012    uses cane to steady due to dizziness   Current Outpatient Prescriptions  Medication Sig Dispense Refill  . aspirin EC 81 MG tablet Take 1 tablet (81 mg total) by mouth daily.      . carvedilol (COREG) 25 MG tablet Take 1 tablet (25 mg total) by mouth 2 (two) times daily with a meal.  60 tablet  11  . clopidogrel (PLAVIX) 75 MG tablet TAKE 1 TABLET BY MOUTH ONCE A DAY  30 tablet  5  . glipiZIDE (GLUCOTROL) 5 MG tablet Take 1 tablet (5 mg total) by mouth daily.  30 tablet  3  . hydrochlorothiazide (MICROZIDE) 12.5 MG capsule TAKE TWO CAPSULES BY MOUTH ONCE DAILY  90 capsule  1  . nitroGLYCERIN (NITROSTAT) 0.4 MG SL tablet Place 1 tablet (0.4  mg total) under the tongue every 5 (five) minutes as needed.  100 tablet  1  . quinapril (ACCUPRIL) 20 MG tablet Take 1 tablet (20 mg total) by mouth daily.  30 tablet  11  . rosuvastatin (CRESTOR) 5 MG tablet Take 1 tablet (5 mg total) by mouth at bedtime.  30 tablet  11  . spironolactone (ALDACTONE) 25 MG tablet Take 1 tablet (25 mg total) by mouth daily.  30 tablet  11   No current facility-administered medications for this visit.   Family History  Problem Relation Age of Onset  . Adopted: Yes  . Heart attack Mother     Died 61  . Heart failure Mother   . Diabetes Mother   . Heart attack Father   . Heart failure Father   . Diabetes Father   . Heart disease Sister     s/p CABG x3 in her mid 62's  . Prostate cancer Father   . Crohn's disease Sister   . Crohn's disease Other     niece  . Kidney disease Brother    History   Social History  . Marital Status: Single  Spouse Name: N/A    Number of Children: 0  . Years of Education: N/A   Occupational History  . disabled    Social History Main Topics  . Smoking status: Never Smoker   . Smokeless tobacco: Never Used  . Alcohol Use: 2.4 oz/week    2 Cans of beer, 2 Shots of liquor per week  . Drug Use: No  . Sexual Activity: Yes   Other Topics Concern  . None   Social History Narrative   Lives in [place] with his cousin   No children   Exercises 3 times a week   Review of Systems: Review of Systems  Constitutional: Negative for fever and chills.  HENT: Positive for congestion. Negative for sore throat.   Eyes: Negative for blurred vision, double vision and photophobia.  Respiratory: Negative for cough, sputum production and shortness of breath.   Cardiovascular: Negative for palpitations and orthopnea.  Gastrointestinal: Negative for heartburn, nausea, vomiting, abdominal pain and diarrhea.  Neurological: Negative for weakness and headaches.    Objective:  Physical Exam: Filed Vitals:   08/28/13 0953    BP: 144/78  Pulse: 74  Temp: 97.8 F (36.6 C)  TempSrc: Oral  Height: 6' (1.829 m)  Weight: 227 lb 4.8 oz (103.103 kg)  SpO2: 100%   Physical Exam  Constitutional: He is oriented to person, place, and time. He appears well-developed and well-nourished. No distress.  Cardiovascular: Normal rate, regular rhythm, normal heart sounds and intact distal pulses.  Exam reveals no gallop and no friction rub.   No murmur heard. Pulmonary/Chest: Effort normal and breath sounds normal. No respiratory distress. He has no wheezes. He has no rales.  Neurological: He is alert and oriented to person, place, and time.  Skin: He is not diaphoretic.  Psychiatric: He has a normal mood and affect. His behavior is normal.     Assessment & Plan:

## 2013-09-02 NOTE — Progress Notes (Signed)
Case discussed with Dr. Komanski at the time of the visit.  We reviewed the resident's history and exam and pertinent patient test results.  I agree with the assessment, diagnosis, and plan of care documented in the resident's note.      

## 2013-09-18 ENCOUNTER — Other Ambulatory Visit: Payer: Self-pay | Admitting: Internal Medicine

## 2013-09-18 DIAGNOSIS — I639 Cerebral infarction, unspecified: Secondary | ICD-10-CM

## 2013-09-25 ENCOUNTER — Other Ambulatory Visit: Payer: Self-pay | Admitting: Internal Medicine

## 2013-09-25 DIAGNOSIS — E119 Type 2 diabetes mellitus without complications: Secondary | ICD-10-CM

## 2013-10-16 ENCOUNTER — Ambulatory Visit: Payer: Self-pay

## 2013-12-09 ENCOUNTER — Other Ambulatory Visit: Payer: Self-pay | Admitting: *Deleted

## 2013-12-09 MED ORDER — HYDROCHLOROTHIAZIDE 12.5 MG PO CAPS: 25.0000 mg | ORAL_CAPSULE | Freq: Every day | ORAL | Status: DC

## 2013-12-16 ENCOUNTER — Other Ambulatory Visit: Payer: Self-pay | Admitting: *Deleted

## 2013-12-17 ENCOUNTER — Other Ambulatory Visit: Payer: Self-pay | Admitting: Internal Medicine

## 2013-12-17 MED ORDER — NITROGLYCERIN 0.4 MG SL SUBL: 0.4000 mg | SUBLINGUAL_TABLET | SUBLINGUAL | Status: DC | PRN

## 2013-12-17 NOTE — Telephone Encounter (Signed)
Faxed in

## 2013-12-22 ENCOUNTER — Ambulatory Visit: Payer: No Typology Code available for payment source

## 2013-12-25 ENCOUNTER — Ambulatory Visit (INDEPENDENT_AMBULATORY_CARE_PROVIDER_SITE_OTHER): Payer: No Typology Code available for payment source | Admitting: Internal Medicine

## 2013-12-25 ENCOUNTER — Encounter: Payer: Self-pay | Admitting: Internal Medicine

## 2013-12-25 VITALS — BP 128/77 | HR 79 | Temp 97.7°F | Ht 71.0 in | Wt 224.7 lb

## 2013-12-25 DIAGNOSIS — I10 Essential (primary) hypertension: Secondary | ICD-10-CM

## 2013-12-25 DIAGNOSIS — E1149 Type 2 diabetes mellitus with other diabetic neurological complication: Secondary | ICD-10-CM

## 2013-12-25 LAB — POCT GLYCOSYLATED HEMOGLOBIN (HGB A1C): HEMOGLOBIN A1C: 7.4

## 2013-12-25 LAB — GLUCOSE, CAPILLARY: GLUCOSE-CAPILLARY: 176 mg/dL — AB (ref 70–99)

## 2013-12-25 MED ORDER — HYDROCHLOROTHIAZIDE 12.5 MG PO TABS: 25.0000 mg | ORAL_TABLET | Freq: Every day | ORAL | Status: DC

## 2013-12-25 NOTE — Assessment & Plan Note (Signed)
BP Readings from Last 3 Encounters:  12/25/13 128/77  08/28/13 144/78  06/25/13 171/83    Lab Results  Component Value Date   NA 144 06/25/2013   K 4.5 06/25/2013   CREATININE 1.7* 06/25/2013    Assessment: Blood pressure control: controlled Progress toward BP goal:  at goal Comments: Patient reports home systolic BPs have been in 130-140s, improved  Plan: Medications:  continue current medications Educational resources provided: brochure Self management tools provided:   Other plans: Will change HCTZ dosage so that patient only has to take 1 pill instead of 2 pills

## 2013-12-25 NOTE — Assessment & Plan Note (Addendum)
Lab Results  Component Value Date   HGBA1C 7.4 12/25/2013   HGBA1C 6.6 08/28/2013   HGBA1C 7.0 06/12/2013     Assessment: Diabetes control: fair control Progress toward A1C goal:  deteriorated Comments: HbA1c 7.4 today, up from 6.6 at last visit on 08/28/13. Pt admits to eating more sweets recently  Plan: Medications:  continue current medications Home glucose monitoring: Frequency: once a day Timing: N/A Instruction/counseling given: discussed foot care and discussed diet Educational resources provided: brochure Self management tools provided:   Other plans:  - Pt given referral to ophthalmology for annual eye exam (Dr. Baird Cancer) - Discussed cutting back on sweets, pt agreeable - Foot care discussed, importance of checking feet every day

## 2013-12-25 NOTE — Patient Instructions (Signed)
It was a pleasure taking care of you today, Kevin Soto.  Here is a summary of what we discussed today:  1. Diabetes - Cut down on sweets - Continue Glipizide - Continue checking blood sugars - Referral to ophthalmology, Dr. Baird Cancer   2. High Blood Pressure - Continue home medications - I will change your HCTZ medication so that you will only have to take 1 pill instead of 2 next time you refill

## 2013-12-25 NOTE — Progress Notes (Signed)
   Subjective:    Patient ID: Kevin Soto, male    DOB: [birth year], 53 y.o.   MRN: [id number]  HPI Mr. Madeira is a 53yo man w/ PMHx of HTN, Type 2 DM w/ peripheral neuropathy, hx retinal hemorrhage in 2012 s/p laser therapy, and hx of CVA in 2012 who presents today for a follow-up visit for the following:  1. DM Type 2: Patient reports that he needs a referral to his ophthalmologist, Dr. Baird Cancer for his annual eye exam. He is taking Glipizide 5 mg daily at home. He was previously on Metformin 1000 mg BID but due to an elevation in his Cr, Metformin was discontinued. Patient reports he tests his blood sugars twice a week with help from his niece, a Presenter, broadcasting, at home. He states his sugars have been in the 160-170s lately. His last HbA1c was 6.6 on 08/28/13 and today his HbA1c was 7.4. Pt admits that he has been eating more sweets recently. He denies any recent hypoglycemic episodes. He reports some tingling/numbness in his feet which is his baseline and constipation for the last 3-4 months. He states he takes Epsom salt and that relieves his constipation. He denies any recent infections, CP, SOB, palpitations, diarrhea, dysuria, polydipsia, polyuria.  2. HTN: Pt takes Carvedilol 25 mg BID, Quinapril 20 mg daily, Spironolactone 25 mg daily, and HCTZ 25 mg daily at home. He states his systolic blood pressures have been in the 130-140s. He denies headaches, changes in vision, and changes in urination. His BP this AM is 128/77.   Review of Systems General: Denies fever, chills, night sweats, changes in weight HEENT: Denies ear pain, rhinorrhea, sore throat CV: Denies orthopnea Pulm: Denies cough, wheezing GI: Denies abdominal pain, dysphagia, melena, hematochezia GU: Denies frequency, urgency Msk: Denies muscle cramps, joint pains Neuro: Denies weakness Skin: Denies rashes, bruises, cuts on his feet    Objective:   Physical Exam General: appears stated age, sitting comfortably in chair,  NAD HEENT: [place]/AT, EOMI, sclera anicteric, mucus membranes moist Neck: supple, no JVD CV: RRR, normal S1/S2, no m/g/r Pulm: CTA bilaterally, breaths non-labored Abd: BS+, soft, non-distended, non-tender Ext: warm, moves all, no edema Neuro: alert and oriented x 3, pleasant, CNs II-XII intact, strength 5/5 in upper and lower extremities       Assessment & Plan:

## 2013-12-28 NOTE — Progress Notes (Signed)
I saw and evaluated the patient.  I personally confirmed the key portions of the history and exam documented by Dr. Arcelia Jew and I reviewed pertinent patient test results.  The assessment, diagnosis, and plan were formulated together and I agree with the documentation in the resident's note.

## 2014-01-07 LAB — HM DIABETES EYE EXAM

## 2014-01-18 ENCOUNTER — Other Ambulatory Visit: Payer: Self-pay | Admitting: *Deleted

## 2014-01-18 ENCOUNTER — Other Ambulatory Visit: Payer: Self-pay | Admitting: Internal Medicine

## 2014-01-18 DIAGNOSIS — I420 Dilated cardiomyopathy: Secondary | ICD-10-CM

## 2014-01-18 MED ORDER — SPIRONOLACTONE 25 MG PO TABS: 25.0000 mg | ORAL_TABLET | Freq: Every day | ORAL | Status: DC

## 2014-01-18 NOTE — Telephone Encounter (Signed)
Called to pharm 

## 2014-02-09 ENCOUNTER — Encounter: Payer: Self-pay | Admitting: *Deleted

## 2014-02-22 ENCOUNTER — Other Ambulatory Visit: Payer: Self-pay | Admitting: Internal Medicine

## 2014-02-24 ENCOUNTER — Other Ambulatory Visit: Payer: Self-pay | Admitting: *Deleted

## 2014-02-24 DIAGNOSIS — I10 Essential (primary) hypertension: Secondary | ICD-10-CM

## 2014-02-25 MED ORDER — HYDROCHLOROTHIAZIDE 25 MG PO TABS: 25.0000 mg | ORAL_TABLET | Freq: Every day | ORAL | Status: DC

## 2014-02-25 NOTE — Telephone Encounter (Signed)
Rx called in to pharmacy. 

## 2014-03-08 ENCOUNTER — Other Ambulatory Visit: Payer: Self-pay | Admitting: *Deleted

## 2014-03-08 DIAGNOSIS — E785 Hyperlipidemia, unspecified: Secondary | ICD-10-CM

## 2014-03-08 NOTE — Telephone Encounter (Signed)
Call from pt requesting Crestor refill, last rx expired on 02/25/14- Will send to pcp for refill.  (Send as a "fax" to [place])

## 2014-03-09 MED ORDER — ROSUVASTATIN CALCIUM 5 MG PO TABS: 5.0000 mg | ORAL_TABLET | Freq: Every day | ORAL | Status: DC

## 2014-03-22 ENCOUNTER — Other Ambulatory Visit: Payer: Self-pay | Admitting: Internal Medicine

## 2014-04-28 ENCOUNTER — Other Ambulatory Visit: Payer: Self-pay | Admitting: *Deleted

## 2014-04-28 MED ORDER — QUINAPRIL HCL 20 MG PO TABS: 20.0000 mg | ORAL_TABLET | Freq: Every day | ORAL | Status: DC

## 2014-04-28 NOTE — Telephone Encounter (Signed)
This is not on his med list but he has been getting it regularly at [facility] and it is mentioned in the 12/25/2013 visit note

## 2014-04-30 ENCOUNTER — Encounter: Payer: Self-pay | Admitting: Cardiology

## 2014-04-30 ENCOUNTER — Ambulatory Visit (INDEPENDENT_AMBULATORY_CARE_PROVIDER_SITE_OTHER): Payer: No Typology Code available for payment source | Admitting: Cardiology

## 2014-04-30 VITALS — BP 150/82 | HR 65 | Ht 72.0 in | Wt 225.3 lb

## 2014-04-30 DIAGNOSIS — I2510 Atherosclerotic heart disease of native coronary artery without angina pectoris: Secondary | ICD-10-CM

## 2014-04-30 NOTE — Patient Instructions (Signed)
Your physician recommends that you schedule a follow-up appointment in: one year with Dr. Hochrein  

## 2014-04-30 NOTE — Progress Notes (Signed)
HPI The patient presents for follow up of CAD.  Since I last saw him he has done well. The patient denies any new symptoms such as chest discomfort, neck or arm discomfort. There has been no new shortness of breath, PND or orthopnea. There have been no reported palpitations, presyncope or syncope.  [facility] and doing other activities almost daily. With this he denies any cardiovascular symptoms. He does have difficulty with his gait and speech.  He has lost a good deal of vision secondary to diabetes.   Allergies  Allergen Reactions  . Clonidine Derivatives     Dizzy, too sleepy    Current Outpatient Prescriptions  Medication Sig Dispense Refill  . aspirin EC 81 MG tablet Take 1 tablet (81 mg total) by mouth daily.    . carvedilol (COREG) 25 MG tablet Take 1 tablet (25 mg total) by mouth 2 (two) times daily with a meal. 60 tablet 11  . clopidogrel (PLAVIX) 75 MG tablet TAKE 1 TABLET BY MOUTH ONCE A DAY 30 tablet 5  . glipiZIDE (GLUCOTROL) 5 MG tablet TAKE ONE TABLET BY MOUTH ONCE DAILY 30 tablet 3  . hydrochlorothiazide (HYDRODIURIL) 25 MG tablet Take 1 tablet (25 mg total) by mouth daily. 30 tablet 3  . nitroGLYCERIN (NITROSTAT) 0.4 MG SL tablet Place 1 tablet (0.4 mg total) under the tongue every 5 (five) minutes as needed. 100 tablet 1  . quinapril (ACCUPRIL) 20 MG tablet Take 1 tablet (20 mg total) by mouth at bedtime. 90 tablet 1  . rosuvastatin (CRESTOR) 5 MG tablet Take 1 tablet (5 mg total) by mouth at bedtime. 30 tablet 11  . spironolactone (ALDACTONE) 25 MG tablet Take 1 tablet (25 mg total) by mouth daily. 90 tablet 4   No current facility-administered medications for this visit.    Past Medical History  Diagnosis Date  . Diabetes mellitus   . Hypertension   . Macular hemorrhage   . Dyslipidemia   . Ischemic cardiomyopathy     echo 7/12: EF 40-45%, septal, apical, inf basal HK, mod LVH, mild MR, mild LAE  . Anemia   . CAD (coronary  artery disease)     cath 8/12:  LM ok, mLAD occluded with trivial collats from right AM and dRCA, mDx (small) 80%, RI 25%, tiny branch of OM 90%, mAM 30-40%, RCA sub-totally occluded, then 80%, EF 40%, inf HK.  He underwent PCI with [name] with placement of a Promus DES to the mRCA x 2  . History of stroke     evidence of CVA on MRI in past  . History of alcohol abuse   . Vision, loss, sudden 02/27/2011  . Gait instability 02/27/2011  . Sleep apnea   . Hyperlipidemia   . Stroke 2012    uses cane to steady due to dizziness    Past Surgical History  Procedure Laterality Date  . Coronary angioplasty with stent placement  2012  . Refractive surgery  2012    right and left eye    ROS:  As stated in the HPI and negative for all other systems.  PHYSICAL EXAM BP 150/82 mmHg  Pulse 65  Ht 6' (1.829 m)  Wt 225 lb 4.8 oz (102.195 kg)  BMI 30.55 kg/m2 GENERAL:  Well appearing HEENT:  Pupils equal round and reactive, fundi not visualized, oral mucosa unremarkable NECK:  No jugular venous distention, waveform within normal limits, carotid upstroke brisk and symmetric, no bruits,  no thyromegaly LYMPHATICS:  No cervical, inguinal adenopathy LUNGS:  Clear to auscultation bilaterally BACK:  No CVA tenderness CHEST:  Unremarkable HEART:  PMI not displaced or sustained,S1 and S2 within normal limits, no S3, no clicks, no rubs, no , positive S4 ABD:  Flat, positive bowel sounds normal in frequency in pitch, no bruits, no rebound, no guarding, no midline pulsatile mass, no hepatomegaly, no splenomegaly EXT:  2 plus pulses throughout, no edema, no cyanosis no clubbing SKIN:  No rashes no nodules NEURO:  Cranial nerves II through XII grossly intact, motor diffuse weakness PSYCH:  Cognitively intact, oriented to person place and time, halting speech.   EKG:  Sinus rhythm, rate 65, axis within normal limits, intervals within normal limits, inferolateral T wave inversions unchanged from previous  EKGs 04/30/2014  ASSESSMENT AND PLAN  CAD:  The patient has no new sypmtoms.  No further cardiovascular testing is indicated.  We will continue with aggressive risk reduction and meds as listed.  CARDIOMYOPATHY: his EF was normal on echo late last year. No further workup is suggested.  HTN:  His blood pressure is elevated. However, he has a blood pressure cuff at home and reports that his pressure is well-controlled in the [id number] systolic range. No change in therapy is indicated.

## 2014-05-17 ENCOUNTER — Ambulatory Visit (INDEPENDENT_AMBULATORY_CARE_PROVIDER_SITE_OTHER): Payer: No Typology Code available for payment source | Admitting: *Deleted

## 2014-05-17 ENCOUNTER — Ambulatory Visit: Payer: Self-pay | Admitting: *Deleted

## 2014-05-17 DIAGNOSIS — Z23 Encounter for immunization: Secondary | ICD-10-CM

## 2014-06-08 ENCOUNTER — Other Ambulatory Visit: Payer: Self-pay | Admitting: *Deleted

## 2014-06-08 MED ORDER — CARVEDILOL 25 MG PO TABS: 25.0000 mg | ORAL_TABLET | Freq: Two times a day (BID) | ORAL | Status: DC

## 2014-06-15 ENCOUNTER — Ambulatory Visit: Payer: PRIVATE HEALTH INSURANCE | Attending: Ophthalmology | Admitting: Occupational Therapy

## 2014-06-15 DIAGNOSIS — E1139 Type 2 diabetes mellitus with other diabetic ophthalmic complication: Secondary | ICD-10-CM | POA: Insufficient documentation

## 2014-06-15 DIAGNOSIS — H5410 Blindness, one eye, low vision other eye, unspecified eyes: Secondary | ICD-10-CM | POA: Insufficient documentation

## 2014-06-15 NOTE — Therapy (Signed)
[facility] [address] [place], [place], [address] Phone: [phone]   Fax:  [phone]  Occupational Therapy Evaluation  Patient Details  Name: Kevin Soto MRN: [id number] Date of Birth: Jun 02, 1960 Referring Provider:  Stephannie Li, OD  Encounter Date: 06/15/2014      OT End of Session - 06/15/14 1231    Visit Number 1   Number of Visits 5   Date for OT Re-Evaluation 08/12/14   Authorization Type GCCN   OT Start Time 0856   OT Stop Time 1005   OT Time Calculation (min) 69 min   Activity Tolerance Patient tolerated treatment well   Behavior During Therapy [facility] for tasks assessed/performed      Past Medical History  Diagnosis Date  . Diabetes mellitus   . Hypertension   . Macular hemorrhage   . Dyslipidemia   . Ischemic cardiomyopathy     echo 7/12: EF 40-45%, septal, apical, inf basal HK, mod LVH, mild MR, mild LAE  . Anemia   . CAD (coronary artery disease)     cath 8/12:  LM ok, mLAD occluded with trivial collats from right AM and dRCA, mDx (small) 80%, RI 25%, tiny branch of OM 90%, mAM 30-40%, RCA sub-totally occluded, then 80%, EF 40%, inf HK.  He underwent PCI with [name] with placement of a Promus DES to the mRCA x 2  . History of stroke     evidence of CVA on MRI in past  . History of alcohol abuse   . Vision, loss, sudden 02/27/2011  . Gait instability 02/27/2011  . Sleep apnea   . Hyperlipidemia   . Stroke 2012    uses cane to steady due to dizziness    Past Surgical History  Procedure Laterality Date  . Coronary angioplasty with stent placement  2012  . Refractive surgery  2012    right and left eye    There were no vitals taken for this visit.  Visit Diagnosis:  Diabetic visual loss: better eye: moderate vision impairment; lesser eye: near-total vision impairment      Subjective Assessment - 06/15/14 0903    Currently in Pain? No/denies          [facility] OT  Assessment - 06/15/14 0001    Assessment   Diagnosis glaucoma, diabetic retinopathy, with history of CVA in 2012   Onset Date 05/10/15   Assessment Pt reports significant visual impairment in left eye.   Prior Therapy CIR, 2012  Pt attends [employer] [facility] center 5 x week.   Precautions   Precautions Fall   Restrictions   Other Position/Activity Restrictions no driving   Home  Environment   Family/patient expects to be discharged to: Private residence   Living Arrangements Other relatives   Available Help at Discharge Family   Type of [place] One level   Bathroom Shower/Tub Tub/Shower unit   Lives With Other (Comment)  sister   Prior Function   Vocation Other (comment)  Pt has not worked since CVA    ADL   ADL comments Pt is modified independent with basic ADLS  Pt reports difficulty determining  color of clothes   IADL   Light Housekeeping --  Assists with laundry, pt's sister adjusts settings.   Meal Prep Does not utilize stove or oven  Pt's sister does majority of cooking/ cleaning   Mobility   Mobility Status Comments modified independent  Vision Assessment   Vision Assessment Vision tested   Visual Acuity Per MD/OD report   Right visual acuity tested 1 meter 20/125-1   Left Visual Acuity tested 1 meter light perception only   Per MD/OD Report OD 20/150, OS HM   Reading Acuity 20/200   Visual Fields --  decreased left peripheral visual field.   Cognition   Mini Mental State Exam  29/30  WNLs     Therapist initiated instruction in use of 4x stand and handheld magnifiers. Pt demonstrates increased difficulty with use of stand magnifier yet he was able to use 4x handheld magnifier with min v.c. For spot reading. Therapist started education reagrding compensatory strategies for vision in a busy environment. Therapist recommends pt use his can when out in the community(Pt was previously issued one by PT and he demonstrates improved  gait, and longer step length when using). Therapist to pursue a PT referral as pt reports balance issues. Therapist will refer pt to [facility] for the Blind for AE/ community resources.                         OT Long Term Goals - 06/15/14 1234    OT LONG TERM GOAL #1   Title Pt will demonstrate ability to spot read using 4x handheld magnifier modified independently.   Time 4   Period Weeks   Status New   OT LONG TERM GOAL #2   Title Pt will verbalize understanding of compensatory strategies for low vision and  AE to maximize safety and independence.   Time 4   Period Weeks   Status New   OT LONG TERM GOAL #3   Title Pt to be referred to [facility] for adaptive equipment/ community resources   Time 4   Period Weeks   Status New               Plan - 06/15/14 1232    Clinical Impression Statement Pt with glaucoma, diabetic retinopathy and hx of CVA presents to occupational therapy with visual impairments which impede performance of ADLS/ IADLS.   Rehab Potential Good   Clinical Impairments Affecting Rehab Potential low vision, balance   OT Frequency 1x / week   OT Duration Other (comment)  plus eval   OT Treatment/Interventions Self-care/ADL training;DME and/or AE instruction;Patient/family education;Therapeutic exercises;Therapeutic activities;Neuromuscular education;Visual/perceptual remediation/compensation   Plan Reinforce use of 4x handheld magnifier   Consulted and Agree with Plan of Care Patient        Problem List Patient Active Problem List   Diagnosis Date Noted  . Diabetic neuropathy 06/13/2013  . Anemia 06/24/2012  . Healthcare maintenance 06/24/2012  . Gait instability 02/27/2011  . Dyslipidemia 02/16/2011  . Vitreous hemorrhage of left eye 01/26/2011  . CAD (coronary artery disease) 01/26/2011  . Retinal hemorrhage 12/15/2010  . Diabetes mellitus type 2 with neurological manifestations 12/15/2010  . Alcohol use  12/07/2010  . Dilated cardiomyopathy   . Hypertension   . CVA (cerebral vascular accident) 11/28/2010    RINE,KATHRYN 06/15/2014, 12:54 PM Theone Murdoch, OTR/L [phone] Phone: [phone] 12:54 PM 06/15/2014 [facility] [address] [place], [place], [address] Phone: [phone]   Fax:  [phone]    Physician: Dr. Stephannie Li  Certification Start Date: [id number] Certification End Date: 08/12/14  Physician Documentation Your signature is required to indicate approval of the treatment plan as stated above.  Please sign and either  send electronically or make a copy of this report for your files and return this physician signed original.  Please mark one 1.__approve of plan   2. ___approve of plan with the followingconditions. ____________________________________________________________________________________________________________________________________________   ______________________                                                       _____________________ Physician Signature                                                                     Date    Faxed to MD for signature

## 2014-06-23 ENCOUNTER — Other Ambulatory Visit: Payer: Self-pay | Admitting: Occupational Therapy

## 2014-06-24 ENCOUNTER — Telehealth: Payer: Self-pay | Admitting: Internal Medicine

## 2014-06-24 ENCOUNTER — Ambulatory Visit: Payer: PRIVATE HEALTH INSURANCE | Attending: Ophthalmology | Admitting: Occupational Therapy

## 2014-06-24 DIAGNOSIS — R2681 Unsteadiness on feet: Secondary | ICD-10-CM

## 2014-06-24 DIAGNOSIS — H5410 Blindness, one eye, low vision other eye, unspecified eyes: Secondary | ICD-10-CM | POA: Insufficient documentation

## 2014-06-24 DIAGNOSIS — E1139 Type 2 diabetes mellitus with other diabetic ophthalmic complication: Secondary | ICD-10-CM

## 2014-06-24 NOTE — Telephone Encounter (Signed)
Kevin Soto from [facility] contacted me that patient would benefit from neuro-rehab after balance issues were noted at his OT referral appointment. I agree with this assessment and that he would benefit from outpatient neuro-rehab. Will place consult.

## 2014-06-24 NOTE — Therapy (Signed)
[facility] [address] [place], [place], [address] Phone: [phone]   Fax:  [phone]  Occupational Therapy Treatment  Patient Details  Name: Kevin Soto MRN: [id number] Date of Birth: 05/23/1960 Referring Provider:  Albin Felling, MD  Encounter Date: 06/24/2014      OT End of Session - 06/24/14 0910    Visit Number 2   Number of Visits 5   Date for OT Re-Evaluation 08/12/14   Authorization Type GCCN   OT Start Time 0851   OT Stop Time 0930   OT Time Calculation (min) 39 min   Activity Tolerance Patient tolerated treatment well   Behavior During Therapy [facility] for tasks assessed/performed      Past Medical History  Diagnosis Date  . Diabetes mellitus   . Hypertension   . Macular hemorrhage   . Dyslipidemia   . Ischemic cardiomyopathy     echo 7/12: EF 40-45%, septal, apical, inf basal HK, mod LVH, mild MR, mild LAE  . Anemia   . CAD (coronary artery disease)     cath 8/12:  LM ok, mLAD occluded with trivial collats from right AM and dRCA, mDx (small) 80%, RI 25%, tiny branch of OM 90%, mAM 30-40%, RCA sub-totally occluded, then 80%, EF 40%, inf HK.  He underwent PCI with [name] with placement of a Promus DES to the mRCA x 2  . History of stroke     evidence of CVA on MRI in past  . History of alcohol abuse   . Vision, loss, sudden 02/27/2011  . Gait instability 02/27/2011  . Sleep apnea   . Hyperlipidemia   . Stroke 2012    uses cane to steady due to dizziness    Past Surgical History  Procedure Laterality Date  . Coronary angioplasty with stent placement  2012  . Refractive surgery  2012    right and left eye    There were no vitals taken for this visit.  Visit Diagnosis:  Diabetic visual loss: better eye: moderate vision impairment; lesser eye: near-total vision impairment      Subjective Assessment - 06/24/14 0853    Currently in Pain? No/denies                          OT Education - 06/24/14 2048    Education provided Yes   Education Details use of 4x magnifier, compensatory strategies for low vision   Person(s) Educated Patient;Caregiver(s)   Methods Explanation;Demonstration;Verbal cues   Comprehension Verbalized understanding;Returned demonstration;Verbal cues required;Need further instruction             OT Long Term Goals - 06/15/14 1234    OT LONG TERM GOAL #1   Title Pt will demonstrate ability to spot read using 4x handheld magnifier modified independently.   Time 4   Period Weeks   Status New   OT LONG TERM GOAL #2   Title Pt will verbalize understanding of compensatory strategies for low vision and  AE to maximize safety and independence.   Time 4   Period Weeks   Status New   OT LONG TERM GOAL #3   Title Pt to be referred to [facility] for adaptive equipment/ community resources   Time 4   Period Weeks   Status New     Treatment: Pt was instructed in use of 4x handheld magnifier and proper positioning/ lighting, min-mod v.c. Pt preacticed navigating a busy  environment using head turns to compensate for visual impairment, min v,c, Pt/ girlfriend were provided with information regarding diabetic retinopathy.          Plan - 06/24/14 2046    Clinical Impression Statement Pt is progressing towards goals with improving ability to read using magnification.   Rehab Potential Good   Clinical Impairments Affecting Rehab Potential low vision, balance   OT Frequency 1x / week   OT Duration 4 weeks   OT Treatment/Interventions Self-care/ADL training;DME and/or AE instruction;Patient/family education;Therapeutic exercises;Therapeutic activities;Neuromuscular education;Visual/perceptual remediation/compensation   Plan Continue to reinforce use of 4x magnifier and compensatory strategies for low vision, check to see if PT order received.   Consulted and Agree with Plan of Care  Patient;Family member/caregiver   Family Member Consulted girlfriend        Problem List Patient Active Problem List   Diagnosis Date Noted  . Diabetic neuropathy 06/13/2013  . Anemia 06/24/2012  . Healthcare maintenance 06/24/2012  . Gait instability 02/27/2011  . Dyslipidemia 02/16/2011  . Vitreous hemorrhage of left eye 01/26/2011  . CAD (coronary artery disease) 01/26/2011  . Retinal hemorrhage 12/15/2010  . Diabetes mellitus type 2 with neurological manifestations 12/15/2010  . Alcohol use 12/07/2010  . Dilated cardiomyopathy   . Hypertension   . CVA (cerebral vascular accident) 11/28/2010    [name] 06/24/2014, 8:49 PM Theone Murdoch, OTR/L [phone] Phone: [phone] 8:50 PM 06/24/2014 [place] [address] [place], [place], [address] Phone: [phone]   Fax:  [phone]

## 2014-07-02 ENCOUNTER — Ambulatory Visit: Payer: PRIVATE HEALTH INSURANCE | Admitting: Occupational Therapy

## 2014-07-02 DIAGNOSIS — E1139 Type 2 diabetes mellitus with other diabetic ophthalmic complication: Secondary | ICD-10-CM

## 2014-07-02 DIAGNOSIS — H5410 Blindness, one eye, low vision other eye, unspecified eyes: Principal | ICD-10-CM

## 2014-07-02 NOTE — Therapy (Signed)
[facility] [address] [place], [place], [address] Phone: [phone]   Fax:  [phone]  Occupational Therapy Treatment  Patient Details  Name: Kevin Soto MRN: [id number] Date of Birth: 04-Jun-1960 Referring Provider:  Albin Felling, MD  Encounter Date: 07/02/2014      OT End of Session - 07/02/14 1737    Visit Number 3   Number of Visits 5   Date for OT Re-Evaluation 08/12/14   Authorization Type GCCN   OT Start Time 1533   OT Stop Time 1615   OT Time Calculation (min) 42 min   Activity Tolerance Patient tolerated treatment well   Behavior During Therapy [facility] for tasks assessed/performed      Past Medical History  Diagnosis Date  . Diabetes mellitus   . Hypertension   . Macular hemorrhage   . Dyslipidemia   . Ischemic cardiomyopathy     echo 7/12: EF 40-45%, septal, apical, inf basal HK, mod LVH, mild MR, mild LAE  . Anemia   . CAD (coronary artery disease)     cath 8/12:  LM ok, mLAD occluded with trivial collats from right AM and dRCA, mDx (small) 80%, RI 25%, tiny branch of OM 90%, mAM 30-40%, RCA sub-totally occluded, then 80%, EF 40%, inf HK.  He underwent PCI with [name] with placement of a Promus DES to the mRCA x 2  . History of stroke     evidence of CVA on MRI in past  . History of alcohol abuse   . Vision, loss, sudden 02/27/2011  . Gait instability 02/27/2011  . Sleep apnea   . Hyperlipidemia   . Stroke 2012    uses cane to steady due to dizziness    Past Surgical History  Procedure Laterality Date  . Coronary angioplasty with stent placement  2012  . Refractive surgery  2012    right and left eye    There were no vitals taken for this visit.  Visit Diagnosis:  Diabetic visual loss: better eye: moderate vision impairment; lesser eye: near-total vision impairment      Subjective Assessment - 07/02/14 1737    Currently in Pain? No/denies                          OT Education - 07/02/14 1737    Education Details use of magnifier, compensatory strategies for low vision   Person(s) Educated Patient   Methods Explanation;Demonstration   Comprehension Verbalized understanding;Returned demonstration;Verbal cues required             OT Long Term Goals - 07/02/14 1548    OT LONG TERM GOAL #1   Title Pt will demonstrate ability to spot read using 4x handheld magnifier modified independently.   Baseline Pt demonstrates ability with increased time and min difficulty   Status Achieved   OT LONG TERM GOAL #2   Title Pt will verbalize understanding of compensatory strategies for low vision and  AE to maximize safety and independence.   Baseline Pt has purachsed a 4x handheld magnifier   Status Achieved   OT LONG TERM GOAL #3   Title Pt to be referred to [facility] for adaptive equipment/ community resources   Status Achieved     Treatment: education provided regarding use of 4x handheld magnifier, pt returned demonstration with increased time and min difficulty. Therapist provided education regarding compensatory strategies for vision and referred pt to [facility] today. Therapist discussed diabetic retinopathy with pt/ girlfriend. Pt agrees with plans to d/c form OT, he plans to continue practicing with 4x handheld magnifier at home.          Plan - 07/02/14 1743    Clinical Impression Statement Pt is progressing towards goals he demonstrates improved ability to read using 4x handheld magnifier.   Rehab Potential Good   Clinical Impairments Affecting Rehab Potential low vision, balance   OT Frequency 1x / week   OT Duration 4 weeks   OT Treatment/Interventions Self-care/ADL training;DME and/or AE instruction;Patient/family education;Therapeutic exercises;Therapeutic activities;Neuromuscular education;Visual/perceptual remediation/compensation   Plan d/c form OT   Recommended  Other Services PT, ST, [facility]   Consulted and Agree with Plan of Care Family member/caregiver;Patient   Family Member Consulted girlfriend        Problem List Patient Active Problem List   Diagnosis Date Noted  . Diabetic neuropathy 06/13/2013  . Anemia 06/24/2012  . Healthcare maintenance 06/24/2012  . Gait instability 02/27/2011  . Dyslipidemia 02/16/2011  . Vitreous hemorrhage of left eye 01/26/2011  . CAD (coronary artery disease) 01/26/2011  . Retinal hemorrhage 12/15/2010  . Diabetes mellitus type 2 with neurological manifestations 12/15/2010  . Alcohol use 12/07/2010  . Dilated cardiomyopathy   . Hypertension   . CVA (cerebral vascular accident) 11/28/2010    RINE,KATHRYN 07/02/2014, 5:45 PM Theone Murdoch, OTR/L [phone] Phone: [phone] 5:45 PM 07/02/2014 [facility] [address] [place], [place], [address] Phone: [phone]   Fax:  [phone]

## 2014-07-02 NOTE — Patient Instructions (Signed)
Practice using your magnifier each day, make sure you have good lighting, try to read for 20-30 mins then give your eyes a rest  Large print books may be easier for you to read  I have referred you to [facility], they will contact you  When walking in the community use your cane and make sure to turn your head to left and right sides, to you don't run into obstacles.

## 2014-07-04 ENCOUNTER — Telehealth: Payer: Self-pay | Admitting: Internal Medicine

## 2014-07-04 DIAGNOSIS — R2681 Unsteadiness on feet: Secondary | ICD-10-CM

## 2014-07-04 DIAGNOSIS — I639 Cerebral infarction, unspecified: Secondary | ICD-10-CM

## 2014-07-04 DIAGNOSIS — R4789 Other speech disturbances: Secondary | ICD-10-CM

## 2014-07-04 NOTE — Telephone Encounter (Signed)
Notified by Carole Civil to edit Neuro-rehab referral note and that patient having word-finding difficulties per OT. Will place referral to speech therapy.

## 2014-07-06 ENCOUNTER — Other Ambulatory Visit: Payer: Self-pay | Admitting: Internal Medicine

## 2014-07-06 ENCOUNTER — Ambulatory Visit: Payer: PRIVATE HEALTH INSURANCE

## 2014-07-13 ENCOUNTER — Encounter: Payer: Self-pay | Admitting: Physical Therapy

## 2014-07-13 ENCOUNTER — Ambulatory Visit: Payer: PRIVATE HEALTH INSURANCE | Admitting: Physical Therapy

## 2014-07-13 DIAGNOSIS — R269 Unspecified abnormalities of gait and mobility: Secondary | ICD-10-CM

## 2014-07-13 DIAGNOSIS — R2689 Other abnormalities of gait and mobility: Secondary | ICD-10-CM

## 2014-07-13 NOTE — Therapy (Signed)
[facility] [address] [place], [place], [address] Phone: [phone]   Fax:  [phone]  Physical Therapy Evaluation  Patient Details  Name: Kevin Soto MRN: [id number] Date of Birth: Jun 18, 1960 Referring Provider:  Albin Felling, MD Resident,  Larey Dresser, MD attending  Encounter Date: 07/13/2014      PT End of Session - 07/13/14 1740    Visit Number 1   Number of Visits 5   Date for PT Re-Evaluation 08/13/14   PT Start Time [id number]   PT Stop Time 1530   PT Time Calculation (min) 45 min   Equipment Utilized During Treatment Gait belt   Activity Tolerance Patient tolerated treatment well   Behavior During Therapy [facility] for tasks assessed/performed      Past Medical History  Diagnosis Date  . Diabetes mellitus   . Hypertension   . Macular hemorrhage   . Dyslipidemia   . Ischemic cardiomyopathy     echo 7/12: EF 40-45%, septal, apical, inf basal HK, mod LVH, mild MR, mild LAE  . Anemia   . CAD (coronary artery disease)     cath 8/12:  LM ok, mLAD occluded with trivial collats from right AM and dRCA, mDx (small) 80%, RI 25%, tiny branch of OM 90%, mAM 30-40%, RCA sub-totally occluded, then 80%, EF 40%, inf HK.  He underwent PCI with [name] with placement of a Promus DES to the mRCA x 2  . History of stroke     evidence of CVA on MRI in past  . History of alcohol abuse   . Vision, loss, sudden 02/27/2011  . Gait instability 02/27/2011  . Sleep apnea   . Hyperlipidemia   . Stroke 2012    uses cane to steady due to dizziness    Past Surgical History  Procedure Laterality Date  . Coronary angioplasty with stent placement  2012  . Refractive surgery  2012    right and left eye    There were no vitals taken for this visit.  Visit Diagnosis:  Abnormality of gait  Balance problems      Subjective Assessment - 07/13/14 1459    Symptoms This 55yo male had a CVA 2012 and diabetic changes  (diagnosed 2012). He was referred to PT when OT evaluated and found balance deficits.   Patient Stated Goals Wants to walk better, without cane, walk faster   Currently in Pain? No/denies          [facility] PT Assessment - 07/13/14 1445    Assessment   Medical Diagnosis CVA, gait abnormality   Precautions   Precautions Fall  not to lift 50#,    Restrictions   Other Position/Activity Restrictions no driving   Balance Screen   Has the patient fallen in the past 6 months Yes   How many times? 2   Has the patient had a decrease in activity level because of a fear of falling?  No   Is the patient reluctant to leave their home because of a fear of falling?  No   Home Environment   Living Enviornment Private residence   Living Arrangements Other relatives  sister & adult niece   Type of [place] to enter   Entrance Stairs-Number of Steps 6   [place] One level   [place] - single point;Walker - 2 wheels   Prior Function   Level of Independence Independent with  basic ADLs;Independent with homemaking with ambulation;Independent with gait;Independent with transfers   Vocation On disability   ROM / Strength   AROM / PROM / Strength AROM;Strength   AROM   Overall AROM  Within functional limits for tasks performed   Strength   Overall Strength Within functional limits for tasks performed  Tested sitting grossly 5/5 4 extremities   Ambulation/Gait   Ambulation/Gait Yes   Ambulation/Gait Assistance 5: Supervision   Ambulation Distance (Feet) 150 Feet   Assistive device Straight cane   Gait Pattern Step-through pattern;Right circumduction;Decreased trunk rotation;Trunk flexed;Wide base of support   Ambulation Surface Level;Indoor   Gait velocity 1.72 ft/sec   Berg Balance Test   Sit to Stand Able to stand without using hands and stabilize independently   Standing Unsupported Able to stand safely 2 minutes   Sitting with  Back Unsupported but Feet Supported on Floor or Stool Able to sit safely and securely 2 minutes   Stand to Sit Sits safely with minimal use of hands   Transfers Able to transfer safely, minor use of hands   Standing Unsupported with Eyes Closed Able to stand 10 seconds with supervision   Standing Ubsupported with Feet Together Able to place feet together independently and stand for 1 minute with supervision   From Standing, Reach Forward with Outstretched Arm Can reach confidently >25 cm (10")   From Standing Position, Pick up Object from Floor Able to pick up shoe, needs supervision   From Standing Position, Turn to Look Behind Over each Shoulder Looks behind one side only/other side shows less weight shift   Turn 360 Degrees Needs close supervision or verbal cueing   Standing Unsupported, Alternately Place Feet on Step/Stool Able to complete 4 steps without aid or supervision   Standing Unsupported, One Foot in Front Able to plae foot ahead of the other independently and hold 30 seconds   Standing on One Leg Tries to lift leg/unable to hold 3 seconds but remains standing independently   Total Score 43   Timed Up and Go Test   Normal TUG (seconds) 17.77  17.77 cane, 18.06 no device   Cognitive TUG (seconds) 19.66  no device                               PT Long Term Goals - 07/13/14 1744    PT LONG TERM GOAL #1   Title demonstrates & verbalizes understanding of progressive HEP for balance & gait. (Target Date: 08/13/14)   Time 4   Period Weeks   Status New   PT LONG TERM GOAL #2   Title ambulates 400' outside including grass with single point cane with supervision.  (Target Date: 08/13/14)   Time 4   Period Weeks   Status New   PT LONG TERM GOAL #3   Title Negotiates ramp, curb & stairs (1 rail) with single point cane with supervision.  (Target Date: 08/13/14)   Time 4   Period Weeks   Status New   PT LONG TERM GOAL #4   Title ambulates with single point  cane with head turns to scan environment without balance loss with supervision.  (Target Date: 08/13/14)   Time 4   Period Weeks   Status New               Plan - 07/13/14 1741    Clinical Impression Statement This 54yo male could benefit from skilled PT  for instruction in gait & balance activities and how to perform safely. His Berg Balance, Timed Up & Go and Gait velocity all indicate fall risk.   Pt will benefit from skilled therapeutic intervention in order to improve on the following deficits Abnormal gait;Decreased balance;Decreased knowledge of precautions;Decreased knowledge of use of DME;Decreased mobility   Rehab Potential Good   PT Frequency 1x / week   PT Duration 4 weeks   PT Treatment/Interventions DME Instruction;Gait training;Stair training;Functional mobility training;Therapeutic activities;Therapeutic exercise;Balance training;Neuromuscular re-education   PT Next Visit Plan HEP & gait with cane on barriers   PT Home Exercise Plan balance (counter & corner)   Consulted and Agree with Plan of Care Patient;Family member/caregiver   Family Member Consulted fiance         Problem List Patient Active Problem List   Diagnosis Date Noted  . Diabetic neuropathy 06/13/2013  . Anemia 06/24/2012  . Healthcare maintenance 06/24/2012  . Gait instability 02/27/2011  . Dyslipidemia 02/16/2011  . Vitreous hemorrhage of left eye 01/26/2011  . CAD (coronary artery disease) 01/26/2011  . Retinal hemorrhage 12/15/2010  . Diabetes mellitus type 2 with neurological manifestations 12/15/2010  . Alcohol use 12/07/2010  . Dilated cardiomyopathy   . Hypertension   . CVA (cerebral vascular accident) 11/28/2010    Jamey Reas PT, DPT 07/13/2014, 5:48 PM  [facility] [address] [place], [place], [address] Phone: [phone]   Fax:  [phone]

## 2014-07-20 ENCOUNTER — Ambulatory Visit: Payer: Medicare Other | Attending: Ophthalmology | Admitting: Physical Therapy

## 2014-07-20 ENCOUNTER — Encounter: Payer: Self-pay | Admitting: Physical Therapy

## 2014-07-20 DIAGNOSIS — G4730 Sleep apnea, unspecified: Secondary | ICD-10-CM | POA: Diagnosis not present

## 2014-07-20 DIAGNOSIS — R42 Dizziness and giddiness: Secondary | ICD-10-CM | POA: Insufficient documentation

## 2014-07-20 DIAGNOSIS — R269 Unspecified abnormalities of gait and mobility: Secondary | ICD-10-CM | POA: Diagnosis not present

## 2014-07-20 DIAGNOSIS — I69322 Dysarthria following cerebral infarction: Secondary | ICD-10-CM | POA: Insufficient documentation

## 2014-07-20 DIAGNOSIS — E785 Hyperlipidemia, unspecified: Secondary | ICD-10-CM | POA: Insufficient documentation

## 2014-07-20 DIAGNOSIS — Z95818 Presence of other cardiac implants and grafts: Secondary | ICD-10-CM | POA: Diagnosis not present

## 2014-07-20 DIAGNOSIS — I420 Dilated cardiomyopathy: Secondary | ICD-10-CM | POA: Insufficient documentation

## 2014-07-20 DIAGNOSIS — I10 Essential (primary) hypertension: Secondary | ICD-10-CM | POA: Insufficient documentation

## 2014-07-20 DIAGNOSIS — I2510 Atherosclerotic heart disease of native coronary artery without angina pectoris: Secondary | ICD-10-CM | POA: Insufficient documentation

## 2014-07-20 DIAGNOSIS — R2689 Other abnormalities of gait and mobility: Secondary | ICD-10-CM

## 2014-07-20 DIAGNOSIS — E1140 Type 2 diabetes mellitus with diabetic neuropathy, unspecified: Secondary | ICD-10-CM | POA: Insufficient documentation

## 2014-07-20 DIAGNOSIS — I69398 Other sequelae of cerebral infarction: Secondary | ICD-10-CM | POA: Diagnosis not present

## 2014-07-20 NOTE — Patient Instructions (Signed)
Copyright  VHI. All rights reserved.  Feet Together, Head Motion - Eyes Open   With eyes open, feet together, move head slowly: up and down, right and left, diagonals (Up-right to down-left and Up-left to down-right) Repeat __10-15__ times each direction  Copyright  VHI. All rights reserved.  Feet Apart, Head Motion - Eyes Closed   With eyes closed and feet shoulder width apart, move head slowly, up and down right and left, diagonals (Up-right to down-left and Up-left to down-right). Repeat __10-15__ times each direction .  Copyright  VHI. All rights reserved.  Feet Apart (Compliant Surface) Head Motion - Eyes Open   With eyes open, standing on compliant surface: ___foam or pillow_____, feet shoulder width apart, move head slowly: up and down right and left, diagonals (Up-right to down-left and Up-left to down-right). Repeat __10-15__ times each direction  Copyright  VHI. All rights reserved.  Feet Apart (Compliant Surface) Head Motion - Eyes Closed   Stand on compliant surface: ___foam or pillow_____ with feet shoulder width apart. Close eyes and move head slowly, up and down right and left, diagonals (Up-right to down-left and Up-left to down-right). Repeat _10-15___ times each direction  Copyright  VHI. All rights reserved.  "I love a Parade" Lift   Using a chair to touch if necessary, march in place 101-5 time Foot raised as high as you can.   http://gt2.exer.us/344   Copyright  VHI. All rights reserved.  Balance: Three-Way Leg Swing   Stand in corner. Alternate kicking legs.  forward _10-15___ times, sideways __10-15__ times, back _10-15___ times.  http://orth.exer.us/86

## 2014-07-20 NOTE — Therapy (Signed)
[facility] [address] [place], [place], [address] Phone: [phone]   Fax:  [phone]  Physical Therapy Treatment  Patient Details  Name: Kevin Soto MRN: [id number] Date of Birth: 08/28/1960 Referring Provider:  Albin Felling, MD  Encounter Date: 07/20/2014      PT End of Session - 07/20/14 1201    Visit Number 2   Number of Visits 5   Date for PT Re-Evaluation 08/13/14   PT Start Time [id number]   PT Stop Time 1100   PT Time Calculation (min) 45 min   Equipment Utilized During Treatment Gait belt   Activity Tolerance Patient tolerated treatment well   Behavior During Therapy [facility] for tasks assessed/performed      Past Medical History  Diagnosis Date  . Diabetes mellitus   . Hypertension   . Macular hemorrhage   . Dyslipidemia   . Ischemic cardiomyopathy     echo 7/12: EF 40-45%, septal, apical, inf basal HK, mod LVH, mild MR, mild LAE  . Anemia   . CAD (coronary artery disease)     cath 8/12:  LM ok, mLAD occluded with trivial collats from right AM and dRCA, mDx (small) 80%, RI 25%, tiny branch of OM 90%, mAM 30-40%, RCA sub-totally occluded, then 80%, EF 40%, inf HK.  He underwent PCI with [name] with placement of a Promus DES to the mRCA x 2  . History of stroke     evidence of CVA on MRI in past  . History of alcohol abuse   . Vision, loss, sudden 02/27/2011  . Gait instability 02/27/2011  . Sleep apnea   . Hyperlipidemia   . Stroke 2012    uses cane to steady due to dizziness    Past Surgical History  Procedure Laterality Date  . Coronary angioplasty with stent placement  2012  . Refractive surgery  2012    right and left eye    There were no vitals taken for this visit.  Visit Diagnosis:  Abnormality of gait  Balance problems      Subjective Assessment - 07/20/14 1027    Symptoms He has not any falls or issues since evaluation   Currently in Pain? No/denies     Neuromuscular  Re-education: PT instructed in HEP for balance in corner for safety: Patient instruction: floor eyes open & eyes closed head movements, foam eyes open & eyes closed. Marching in place, alternate kicks forward, side and back. Treadmill- patient reports walks at [facility] 15 minutes at 1.74mph. Patient has decreased step length for 1.5 mph - PT instructed using Gait Trainer program in longer step length, patient return demo. Speed decreased to 1.2 mph and head turns each direction for 2 steps.                       PT Education - 07/20/14 1159    Education provided Yes   Education Details See patient instruction HEP, treadmill activities   Person(s) Educated Patient;Other (comment)  fiance   Methods Explanation;Demonstration;Handout   Comprehension Verbalized understanding;Returned demonstration;Need further instruction             PT Long Term Goals - 07/13/14 1744    PT LONG TERM GOAL #1   Title demonstrates & verbalizes understanding of progressive HEP for balance & gait. (Target Date: 08/13/14)   Time 4   Period Weeks   Status New   PT LONG TERM GOAL #2   Title  ambulates 400' outside including grass with single point cane with supervision.  (Target Date: 08/13/14)   Time 4   Period Weeks   Status New   PT LONG TERM GOAL #3   Title Negotiates ramp, curb & stairs (1 rail) with single point cane with supervision.  (Target Date: 08/13/14)   Time 4   Period Weeks   Status New   PT LONG TERM GOAL #4   Title ambulates with single point cane with head turns to scan environment without balance loss with supervision.  (Target Date: 08/13/14)   Time 4   Period Weeks   Status New               Plan - 07/20/14 1203    Clinical Impression Statement Patient appears to have basic understanding of HEP for balance and treadmill but needs further instructions.   Pt will benefit from skilled therapeutic intervention in order to improve on the following deficits  Abnormal gait;Decreased balance;Decreased knowledge of precautions;Decreased knowledge of use of DME;Decreased mobility   Rehab Potential Good   PT Frequency 1x / week   PT Duration 4 weeks   PT Treatment/Interventions DME Instruction;Gait training;Stair training;Functional mobility training;Therapeutic activities;Therapeutic exercise;Balance training;Neuromuscular re-education   PT Next Visit Plan check HEP & gait with cane on barriers   PT Home Exercise Plan balance (counter - still needs  & corner - issued 3/1)   Consulted and Agree with Plan of Care Patient;Family member/caregiver   Family Member Consulted fiance        Problem List Patient Active Problem List   Diagnosis Date Noted  . Diabetic neuropathy 06/13/2013  . Anemia 06/24/2012  . Healthcare maintenance 06/24/2012  . Gait instability 02/27/2011  . Dyslipidemia 02/16/2011  . Vitreous hemorrhage of left eye 01/26/2011  . CAD (coronary artery disease) 01/26/2011  . Retinal hemorrhage 12/15/2010  . Diabetes mellitus type 2 with neurological manifestations 12/15/2010  . Alcohol use 12/07/2010  . Dilated cardiomyopathy   . Hypertension   . CVA (cerebral vascular accident) 11/28/2010    Jamey Reas PT, DPT 07/20/2014, 12:06 PM  [facility] [address] [place], [place], [address] Phone: [phone]   Fax:  [phone]

## 2014-07-24 DIAGNOSIS — I2510 Atherosclerotic heart disease of native coronary artery without angina pectoris: Secondary | ICD-10-CM | POA: Diagnosis not present

## 2014-07-24 DIAGNOSIS — R269 Unspecified abnormalities of gait and mobility: Secondary | ICD-10-CM | POA: Diagnosis not present

## 2014-07-24 DIAGNOSIS — I69322 Dysarthria following cerebral infarction: Secondary | ICD-10-CM | POA: Diagnosis not present

## 2014-07-24 DIAGNOSIS — I10 Essential (primary) hypertension: Secondary | ICD-10-CM | POA: Diagnosis not present

## 2014-07-24 DIAGNOSIS — R42 Dizziness and giddiness: Secondary | ICD-10-CM | POA: Diagnosis not present

## 2014-07-24 DIAGNOSIS — E785 Hyperlipidemia, unspecified: Secondary | ICD-10-CM | POA: Diagnosis not present

## 2014-07-24 DIAGNOSIS — E1140 Type 2 diabetes mellitus with diabetic neuropathy, unspecified: Secondary | ICD-10-CM | POA: Diagnosis not present

## 2014-07-24 DIAGNOSIS — I420 Dilated cardiomyopathy: Secondary | ICD-10-CM | POA: Diagnosis not present

## 2014-07-24 DIAGNOSIS — I69398 Other sequelae of cerebral infarction: Secondary | ICD-10-CM | POA: Diagnosis not present

## 2014-07-24 DIAGNOSIS — G4730 Sleep apnea, unspecified: Secondary | ICD-10-CM | POA: Diagnosis not present

## 2014-07-24 DIAGNOSIS — Z95818 Presence of other cardiac implants and grafts: Secondary | ICD-10-CM | POA: Diagnosis not present

## 2014-07-27 ENCOUNTER — Ambulatory Visit: Payer: Medicare Other | Admitting: Physical Therapy

## 2014-07-27 ENCOUNTER — Encounter: Payer: Self-pay | Admitting: Physical Therapy

## 2014-07-27 DIAGNOSIS — R2689 Other abnormalities of gait and mobility: Secondary | ICD-10-CM

## 2014-07-27 DIAGNOSIS — R269 Unspecified abnormalities of gait and mobility: Secondary | ICD-10-CM

## 2014-07-27 DIAGNOSIS — E785 Hyperlipidemia, unspecified: Secondary | ICD-10-CM | POA: Diagnosis not present

## 2014-07-27 DIAGNOSIS — R42 Dizziness and giddiness: Secondary | ICD-10-CM | POA: Diagnosis not present

## 2014-07-27 DIAGNOSIS — I69398 Other sequelae of cerebral infarction: Secondary | ICD-10-CM | POA: Diagnosis not present

## 2014-07-27 DIAGNOSIS — I69322 Dysarthria following cerebral infarction: Secondary | ICD-10-CM | POA: Diagnosis not present

## 2014-07-27 DIAGNOSIS — E1140 Type 2 diabetes mellitus with diabetic neuropathy, unspecified: Secondary | ICD-10-CM | POA: Diagnosis not present

## 2014-07-27 NOTE — Patient Instructions (Signed)
Fitness Plan: 4 components well-rounded program 1. Strength - machines, bands, gravity    Do 3-5 days/ week.   Strengthen arms, legs and trunk.  Make sure you work both sides of joint: abdominals & back, biceps & triceps, inside & outside of thigh 2.Flexibility - stretching, Yoga  Do 3-5 days/week  Stretch arms, legs & trunk 3.Endurance- machines, walking,   Do 3-5 days /week  More important to increase time / how long you do it. Not necessarily intensity or speed.   Goal would be longer than 20-30 minutes.   Turn your head to look around - right /left, up/down and diagonals 4. Balance - exercises, yoga,   Do 3-5 days /week

## 2014-07-27 NOTE — Therapy (Signed)
[facility] [address] [place], [place], [address] Phone: [phone]   Fax:  [phone]  Physical Therapy Treatment  Patient Details  Name: Kevin Soto MRN: [id number] Date of Birth: 1961/04/01 Referring Provider:  Juliet Rude, MD  Encounter Date: 07/27/2014      PT End of Session - 07/27/14 1208    Visit Number 3   Number of Visits 5   Date for PT Re-Evaluation 08/13/14   PT Start Time 1100   PT Stop Time 1149   PT Time Calculation (min) 49 min   Equipment Utilized During Treatment Gait belt   Activity Tolerance Patient tolerated treatment well   Behavior During Therapy [facility] for tasks assessed/performed      Past Medical History  Diagnosis Date  . Diabetes mellitus   . Hypertension   . Macular hemorrhage   . Dyslipidemia   . Ischemic cardiomyopathy     echo 7/12: EF 40-45%, septal, apical, inf basal HK, mod LVH, mild MR, mild LAE  . Anemia   . CAD (coronary artery disease)     cath 8/12:  LM ok, mLAD occluded with trivial collats from right AM and dRCA, mDx (small) 80%, RI 25%, tiny branch of OM 90%, mAM 30-40%, RCA sub-totally occluded, then 80%, EF 40%, inf HK.  He underwent PCI with [name] with placement of a Promus DES to the mRCA x 2  . History of stroke     evidence of CVA on MRI in past  . History of alcohol abuse   . Vision, loss, sudden 02/27/2011  . Gait instability 02/27/2011  . Sleep apnea   . Hyperlipidemia   . Stroke 2012    uses cane to steady due to dizziness    Past Surgical History  Procedure Laterality Date  . Coronary angioplasty with stent placement  2012  . Refractive surgery  2012    right and left eye    There were no vitals taken for this visit.  Visit Diagnosis:  Abnormality of gait  Balance problems      Subjective Assessment - 07/27/14 1109    Symptoms he has been doing his exercises and they help.   Currently in Pain? No/denies     Neuromuscular  Re-Ed: Patient demonstrated understanding of balance program introduced last session with minor correctional cues. PT instructed patient and fiance in well-rounded fitness program with recommendation to continue at [facility] & add exercises / activities from PT. Both verbalized understanding.  Gait Training: Patient ambulated with single point cane in hall for reference walls with tactile cues for speed & path working on head turns and increasing speed. PT instructed fiance how to assist /guard patient to work on skills in park or community setting. Both verbalized understanding.                       PT Education - 07/27/14 1206    Education provided Yes   Education Details reviewed HEP issued last week, introduced component of well-rounded fitness plan - see patient instructions, gait with head turns & increasing speed with fiance helping   Person(s) Educated Patient;Other (comment)  faince   Methods Explanation;Demonstration;Handout   Comprehension Verbalized understanding;Returned demonstration             PT Long Term Goals - 07/13/14 1744    PT LONG TERM GOAL #1   Title demonstrates & verbalizes understanding of progressive HEP for balance & gait. (  Target Date: 08/13/14)   Time 4   Period Weeks   Status New   PT LONG TERM GOAL #2   Title ambulates 400' outside including grass with single point cane with supervision.  (Target Date: 08/13/14)   Time 4   Period Weeks   Status New   PT LONG TERM GOAL #3   Title Negotiates ramp, curb & stairs (1 rail) with single point cane with supervision.  (Target Date: 08/13/14)   Time 4   Period Weeks   Status New   PT LONG TERM GOAL #4   Title ambulates with single point cane with head turns to scan environment without balance loss with supervision.  (Target Date: 08/13/14)   Time 4   Period Weeks   Status New               Plan - 07/27/14 1208    Clinical Impression Statement Patient and fiance  seem to understand incoorperating balance into gait.    Pt will benefit from skilled therapeutic intervention in order to improve on the following deficits Abnormal gait;Decreased balance;Decreased knowledge of precautions;Decreased knowledge of use of DME;Decreased mobility   Rehab Potential Good   PT Frequency 1x / week   PT Duration 4 weeks   PT Treatment/Interventions DME Instruction;Gait training;Stair training;Functional mobility training;Therapeutic activities;Therapeutic exercise;Balance training;Neuromuscular re-education   PT Next Visit Plan check HEP & gait with cane on barriers   PT Home Exercise Plan gait with head turns,    Consulted and Agree with Plan of Care Patient;Family member/caregiver   Family Member Consulted fiance        Problem List Patient Active Problem List   Diagnosis Date Noted  . Diabetic neuropathy 06/13/2013  . Anemia 06/24/2012  . Healthcare maintenance 06/24/2012  . Gait instability 02/27/2011  . Dyslipidemia 02/16/2011  . Vitreous hemorrhage of left eye 01/26/2011  . CAD (coronary artery disease) 01/26/2011  . Retinal hemorrhage 12/15/2010  . Diabetes mellitus type 2 with neurological manifestations 12/15/2010  . Alcohol use 12/07/2010  . Dilated cardiomyopathy   . Hypertension   . CVA (cerebral vascular accident) 11/28/2010    Jamey Reas PT, DPT 07/27/2014, 12:10 PM  [facility] [address] [place], [place], [address] Phone: [phone]   Fax:  [phone]

## 2014-07-28 ENCOUNTER — Other Ambulatory Visit: Payer: Self-pay | Admitting: *Deleted

## 2014-07-28 DIAGNOSIS — I10 Essential (primary) hypertension: Secondary | ICD-10-CM

## 2014-07-28 NOTE — Telephone Encounter (Signed)
[facility] can no longer get accupril for free, would you consider changing to Benicar or Benicar HCT? Please change in EPIC

## 2014-07-29 ENCOUNTER — Ambulatory Visit: Payer: Medicare Other | Attending: Internal Medicine | Admitting: Speech Pathology

## 2014-07-29 DIAGNOSIS — I69322 Dysarthria following cerebral infarction: Secondary | ICD-10-CM | POA: Diagnosis not present

## 2014-07-29 MED ORDER — OLMESARTAN MEDOXOMIL 20 MG PO TABS: 20.0000 mg | ORAL_TABLET | Freq: Every day | ORAL | Status: DC

## 2014-07-29 NOTE — Telephone Encounter (Signed)
Rx faxed in.

## 2014-07-29 NOTE — Patient Instructions (Signed)
   SLOW LOUD OVER-ENNUNCIATE PAUSE  PA TA KA  PATA TAKA KAPA PATAKA  BUTTERCUP  CATERPILLAR  [place] BUCCANEERS  SLOW AND BIG - EXAGGERATE YOUR MOUTH, MAKE EACH CONSONANT  Speech exercises - do 5x each, x2-3/day SLOW BIG  SAY THE FOLLOWING- make every sound! Red leather, yellow leather  Big grocery buggy    Purple baby carriage    [facility] copper coffee pot Ripe purple cabbage Three free throws [employer], Blue Bulb Flash Message Dave dipped the dessert  [employer] [place] An Chief Financial Officer for [employer] Five valve levers Six Thick Thistles Stick Double Bubble Gum Fat cows give milk [employer] Gophers Fat frogs flip freely Kohl's into bed Get that game to [employer] Fish Cinnamon aluminum linoleum Black bugs blood Lovely lemon linament Buckle that Education officer, museum Takes Time A Shifty Salt Shaker   The gospel of Mark Shirts shrink, shells shouldn't [place] [employer] Take the tackle box Give me five flapjacks Fundamental relatives Call the cat "Buttercup" A calendar of [place] Four floors to cover Yellow oil ointment Fellow lovers of felines Catastrophe in [place]' plums The church's chimes chimed Telling time until eleven Unique [place] A Three Toed Tree Toad Knapsack Strap Snap [place]

## 2014-07-29 NOTE — Therapy (Signed)
Kevin Soto, [place], [address] Phone: [phone]   Fax:  [phone]  Speech Language Pathology Treatment  Patient Details  Name: Kevin Soto MRN: [id number] Date of Birth: [birth year] Referring Provider:  Juliet Rude, MD  Encounter Date: 07/29/2014      End of Session - 07/29/14 1051    SLP Start Time 6   SLP Stop Time  [id number]   SLP Time Calculation (min) 36 min   Activity Tolerance Patient tolerated treatment well      Past Medical History  Diagnosis Date  . Diabetes mellitus   . Hypertension   . Macular hemorrhage   . Dyslipidemia   . Ischemic cardiomyopathy     echo 7/12: EF 40-45%, septal, apical, inf basal HK, mod LVH, mild MR, mild LAE  . Anemia   . CAD (coronary artery disease)     cath 8/12:  LM ok, mLAD occluded with trivial collats from right AM and dRCA, mDx (small) 80%, RI 25%, tiny branch of OM 90%, mAM 30-40%, RCA sub-totally occluded, then 80%, EF 40%, inf HK.  He underwent PCI with Dr. Angelena Soto with placement of a Promus DES to the mRCA x 2  . History of stroke     evidence of CVA on MRI in past  . History of alcohol abuse   . Vision, loss, sudden 02/27/2011  . Gait instability 02/27/2011  . Sleep apnea   . Hyperlipidemia   . Stroke 2012    uses cane to steady due to dizziness    Past Surgical History  Procedure Laterality Date  . Coronary angioplasty with stent placement  2012  . Refractive surgery  2012    right and left eye    There were no vitals taken for this visit.  Visit Diagnosis: Dysarthria as late effect of stroke - Plan: SLP plan of care cert/re-cert      Subjective Assessment - 07/29/14 1025    Symptoms "They told me I needed to come to speech therapy for my talking"   Currently in Pain? No/denies          SLP Evaluation [facility] - 07/29/14 1025    SLP Visit Information   SLP Received On 07/29/14   General Information   HPI 54 y.o,male  s/p CVA in 2012 with slurred speech. Pt reports he has never had speech therapy and would like to talk more clearly.    Behavioral/Cognition WFL for ADL's  - reports managing meds and finances without difficulty.   Mobility Status walks with a cane   Prior Functional Status   Cognitive/Linguistic Baseline Within functional limits   Type of Home House    Lives With Family   Available Support Family;Friend(s);Brewing technologist On disability   Cognition   Overall Cognitive Status Within Functional Limits for tasks assessed   Motor Speech   Overall Motor Speech Impaired   Respiration Within functional limits   Phonation Normal   Resonance Within functional limits   Articulation Impaired   Level of Impairment Conversation   Intelligibility Intelligibility reduced   Conversation 75-100% accurate   Motor Planning Witnin functional limits   Motor Speech Errors Aware   Interfering Components --  time of onset   Effective Techniques Slow rate;Over-articulate;Pause   Phonation [facility]           ADULT SLP TREATMENT - 07/29/14 1056    Cognitive-Linquistic Treatment   Skilled Treatment Pt trained in compensations for dysarthria  at syllable level with nonsense words, multisyllabic words and phrases. Kevin Soto required usual mod A with modeling and direct instruction initially. By the end of the session, he was using compensations with occassional min A. Pt was trained in HEP for dysarthria with usual min A.           SLP Education - 07/29/14 1046    Education provided Yes   Education Details Compensations for dysarthria, HEP   Person(s) Educated Patient;Spouse   Methods Explanation;Demonstration   Comprehension Verbalized understanding;Returned demonstration            SLP Long Term Goals - 07/29/14 1059    SLP LONG TERM GOAL #1   Title Pt will perform HEP for dysarthria with mod I (09/02/14)   Time 4   Period Weeks   Status New   SLP LONG TERM GOAL #2   Title Pt will  utilize/verbalize compensations for dysarthria with mod I (09/02/14)   Time 4   Period Weeks   SLP LONG TERM GOAL #3   Title Pt will be 95% intelligible in noisy environement over 10 minute conversation with rare min A (09/02/14)   Time 4   Period Weeks          Plan - 07/29/14 1051    Clinical Impression Statement KevinSoto, a 54 y.o. male suffered a CVA in 2012. He reports his speech has been slurred since this stroke, but he has never received ST. He presents with mild dysarthria with slurred speech and imprecie consonants. His is accompanied by his girlfriend, who reports she does have some difficulty understanding him over the phone. Kevin Soto denies difficulty communicating in the communicty, but wuold like to make his speech more clear. He subjectively rates his speech to be 75% to 80% when compared to his premorbid speech. In this quiet environment, Kevin Soto is 100% intelligible. He does not report difficulty finding words and I did not appreciate word finding difficulties during this eval.  I recocmmend skilled ST to train pt in compensations for dysarthira and in HEP for dysarthira to maximize intellgibility ove the phone and in noisy environments.     Speech Therapy Frequency 1x /week   Duration 4 weeks   Treatment/Interventions Functional tasks;Patient/family education;SLP instruction and feedback;Compensatory strategies   Potential to Achieve Goals Good   Potential Considerations Other (comment)  remote onset of dysarthira (2012)   Consulted and Agree with Plan of Care Patient        Problem List Patient Active Problem List   Diagnosis Date Noted  . Diabetic neuropathy 06/13/2013  . Anemia 06/24/2012  . Healthcare maintenance 06/24/2012  . Gait instability 02/27/2011  . Dyslipidemia 02/16/2011  . Vitreous hemorrhage of left eye 01/26/2011  . CAD (coronary artery disease) 01/26/2011  . Retinal hemorrhage 12/15/2010  . Diabetes mellitus type 2 with neurological  manifestations 12/15/2010  . Alcohol use 12/07/2010  . Dilated cardiomyopathy   . Hypertension   . CVA (cerebral vascular accident) 11/28/2010    Jacquis Paxton, Annye Rusk, SLP 07/29/2014, 11:06 AM  [facility] [address] [place], [place], [address] Phone: [phone]   Fax:  [phone]

## 2014-07-29 NOTE — Assessment & Plan Note (Signed)
Pt on accupril 20 and HCTZ 25. Pharmacy will no longer provide this for free and needs changed to Benicar. Equivalent dose is Benicar 20. Keep HCTZ at 25

## 2014-08-03 ENCOUNTER — Encounter: Payer: Self-pay | Admitting: Physical Therapy

## 2014-08-03 ENCOUNTER — Ambulatory Visit: Payer: Medicare Other | Admitting: Physical Therapy

## 2014-08-03 ENCOUNTER — Ambulatory Visit: Payer: Medicare Other

## 2014-08-03 DIAGNOSIS — R42 Dizziness and giddiness: Secondary | ICD-10-CM | POA: Diagnosis not present

## 2014-08-03 DIAGNOSIS — R269 Unspecified abnormalities of gait and mobility: Secondary | ICD-10-CM | POA: Diagnosis not present

## 2014-08-03 DIAGNOSIS — I69322 Dysarthria following cerebral infarction: Secondary | ICD-10-CM | POA: Diagnosis not present

## 2014-08-03 DIAGNOSIS — E785 Hyperlipidemia, unspecified: Secondary | ICD-10-CM | POA: Diagnosis not present

## 2014-08-03 DIAGNOSIS — R2689 Other abnormalities of gait and mobility: Secondary | ICD-10-CM

## 2014-08-03 DIAGNOSIS — E1140 Type 2 diabetes mellitus with diabetic neuropathy, unspecified: Secondary | ICD-10-CM | POA: Diagnosis not present

## 2014-08-03 DIAGNOSIS — I69398 Other sequelae of cerebral infarction: Secondary | ICD-10-CM | POA: Diagnosis not present

## 2014-08-03 NOTE — Patient Instructions (Signed)
  Please complete the assigned speech therapy homework before your next session.

## 2014-08-03 NOTE — Therapy (Signed)
[facility] [address] [place], [place], [address] Phone: [phone]   Fax:  [phone]  Speech Language Pathology Treatment  Patient Details  Name: Kevin Soto MRN: [id number] Date of Birth: 23-Sep-1960 Referring Provider:  Juliet Rude, MD  Encounter Date: 08/03/2014      End of Session - 08/03/14 1358    Visit Number 2   Number of Visits 4   Date for SLP Re-Evaluation 09/02/14   SLP Start Time 91   SLP Stop Time  1400   SLP Time Calculation (min) 41 min   Activity Tolerance Patient tolerated treatment well      Past Medical History  Diagnosis Date  . Diabetes mellitus   . Hypertension   . Macular hemorrhage   . Dyslipidemia   . Ischemic cardiomyopathy     echo 7/12: EF 40-45%, septal, apical, inf basal HK, mod LVH, mild MR, mild LAE  . Anemia   . CAD (coronary artery disease)     cath 8/12:  LM ok, mLAD occluded with trivial collats from right AM and dRCA, mDx (small) 80%, RI 25%, tiny branch of OM 90%, mAM 30-40%, RCA sub-totally occluded, then 80%, EF 40%, inf HK.  He underwent PCI with [name] with placement of a Promus DES to the mRCA x 2  . History of stroke     evidence of CVA on MRI in past  . History of alcohol abuse   . Vision, loss, sudden 02/27/2011  . Gait instability 02/27/2011  . Sleep apnea   . Hyperlipidemia   . Stroke 2012    uses cane to steady due to dizziness    Past Surgical History  Procedure Laterality Date  . Coronary angioplasty with stent placement  2012  . Refractive surgery  2012    right and left eye    There were no vitals filed for this visit.  Visit Diagnosis: Dysarthria as late effect of stroke      Subjective Assessment - 08/03/14 1321    Currently in Pain? No/denies               ADULT SLP TREATMENT - 08/03/14 1322    General Information   Behavior/Cognition Alert;Pleasant mood;Cooperative   Treatment Provided   Treatment provided  Cognitive-Linquistic   Pain Assessment   Pain Assessment No/denies pain   Cognitive-Linquistic Treatment   Treatment focused on Dysarthria   Skilled Treatment SLP reviewed pt's HEP with him today with pt independent. Multisentence responses with compensations used at least 80% of the time. Imprecise words were self-corrected. Conversation re: personal (how he met fiance) and impersonal (how he would purchase a new car) were completed with rare min A for compensation use.    Assessment / Recommendations / Plan   Plan Continue with current plan of care   Progression Toward Goals   Progression toward goals Progressing toward goals              SLP Long Term Goals - 08/03/14 1409    SLP LONG TERM GOAL #1   Title Pt will perform HEP for dysarthria with mod I (09/02/14)   Status Achieved   SLP LONG TERM GOAL #2   Title Pt will utilize/verbalize compensations for dysarthria with mod I (09/02/14)   Status Achieved   SLP LONG TERM GOAL #3   Title Pt will be 95% intelligible in noisy environement over 10 minute conversation with rare min A (09/02/14)   Time 3  Period Weeks   Status On-going          Plan - 08/03/14 1410    Clinical Impression Statement Pt is beginning to use compensations in short conversational tasks. He met 2/3 LTGs today. Expect pt to participate in 1-2 more weeks of therapy before discharge.        Problem List Patient Active Problem List   Diagnosis Date Noted  . Diabetic neuropathy 06/13/2013  . Anemia 06/24/2012  . Healthcare maintenance 06/24/2012  . Gait instability 02/27/2011  . Dyslipidemia 02/16/2011  . Vitreous hemorrhage of left eye 01/26/2011  . CAD (coronary artery disease) 01/26/2011  . Retinal hemorrhage 12/15/2010  . Diabetes mellitus type 2 with neurological manifestations 12/15/2010  . Alcohol use 12/07/2010  . Dilated cardiomyopathy   . Hypertension   . CVA (cerebral vascular accident) 11/28/2010    Bridgett Larsson 08/03/2014,  2:11 PM  [facility] [address] [place], [place], [address] Phone: [phone]   Fax:  [phone]

## 2014-08-04 NOTE — Therapy (Signed)
[facility] [address] [place], [place], [address] Phone: [phone]   Fax:  [phone]  Physical Therapy Treatment  Patient Details  Name: Kevin Soto MRN: [id number] Date of Birth: July 06, 1960 Referring Provider:  Juliet Rude, MD  Encounter Date: 08/03/2014      PT End of Session - 08/03/14 1230    Visit Number 4   Number of Visits 5   Date for PT Re-Evaluation 08/13/14   PT Start Time 1230   PT Stop Time 1315   PT Time Calculation (min) 45 min   Equipment Utilized During Treatment Gait belt   Activity Tolerance Patient tolerated treatment well   Behavior During Therapy [facility] for tasks assessed/performed      Past Medical History  Diagnosis Date  . Diabetes mellitus   . Hypertension   . Macular hemorrhage   . Dyslipidemia   . Ischemic cardiomyopathy     echo 7/12: EF 40-45%, septal, apical, inf basal HK, mod LVH, mild MR, mild LAE  . Anemia   . CAD (coronary artery disease)     cath 8/12:  LM ok, mLAD occluded with trivial collats from right AM and dRCA, mDx (small) 80%, RI 25%, tiny branch of OM 90%, mAM 30-40%, RCA sub-totally occluded, then 80%, EF 40%, inf HK.  He underwent PCI with [name] with placement of a Promus DES to the mRCA x 2  . History of stroke     evidence of CVA on MRI in past  . History of alcohol abuse   . Vision, loss, sudden 02/27/2011  . Gait instability 02/27/2011  . Sleep apnea   . Hyperlipidemia   . Stroke 2012    uses cane to steady due to dizziness    Past Surgical History  Procedure Laterality Date  . Coronary angioplasty with stent placement  2012  . Refractive surgery  2012    right and left eye    There were no vitals filed for this visit.  Visit Diagnosis:  Abnormality of gait  Balance problems      Subjective Assessment - 08/03/14 1240    Symptoms He can tell improved balance. He went down stairs with improved steadiness. Exercises are going  well.    Currently in Pain? No/denies     Gait Training: Patient's wood single point cane [facility]) is ~1" too short. His fiance reports seeing gait deviations this creates when PT pointed them out. PT instructed pt & fiance in proper height with plans to purchase new cane. PT instructed with demo in technique / sequencing SPC on ramp & curb. Patient negotiated ramp & curb with SPC with SBA multiple reps to work on [employer] & aides with his learning needs. Stairs: reciprocal with 1 rail & SPC with cues on SPC use. Patient's home has 4-5 steps with no rail but wall on left side. PT set up simulation and demo / instructed technique. Patient negotiated with SPC touching wall multiple reps with SBA.                          PT Education - 08/03/14 1230    Education provided Yes   Education Details cane use on stairs, ramp & curb, proper cane height   Person(s) Educated Patient;Spouse   Methods Explanation;Demonstration   Comprehension Verbalized understanding;Returned demonstration;Need further instruction             PT Long Term Goals - 07/13/14 1744  PT LONG TERM GOAL #1   Title demonstrates & verbalizes understanding of progressive HEP for balance & gait. (Target Date: 08/13/14)   Time 4   Period Weeks   Status New   PT LONG TERM GOAL #2   Title ambulates 400' outside including grass with single point cane with supervision.  (Target Date: 08/13/14)   Time 4   Period Weeks   Status New   PT LONG TERM GOAL #3   Title Negotiates ramp, curb & stairs (1 rail) with single point cane with supervision.  (Target Date: 08/13/14)   Time 4   Period Weeks   Status New   PT LONG TERM GOAL #4   Title ambulates with single point cane with head turns to scan environment without balance loss with supervision.  (Target Date: 08/13/14)   Time 4   Period Weeks   Status New               Plan - 08/03/14 1230    Clinical Impression Statement Patient & fiance have  better understanding of cane use on barriers.   Pt will benefit from skilled therapeutic intervention in order to improve on the following deficits Abnormal gait;Decreased balance;Decreased knowledge of precautions;Decreased knowledge of use of DME;Decreased mobility   Rehab Potential Good   PT Frequency 1x / week   PT Duration 4 weeks   PT Treatment/Interventions DME Instruction;Gait training;Stair training;Functional mobility training;Therapeutic activities;Therapeutic exercise;Balance training;Neuromuscular re-education   PT Next Visit Plan check LTGs   Consulted and Agree with Plan of Care Patient;Family member/caregiver   Family Member Consulted fiance        Problem List Patient Active Problem List   Diagnosis Date Noted  . Diabetic neuropathy 06/13/2013  . Anemia 06/24/2012  . Healthcare maintenance 06/24/2012  . Gait instability 02/27/2011  . Dyslipidemia 02/16/2011  . Vitreous hemorrhage of left eye 01/26/2011  . CAD (coronary artery disease) 01/26/2011  . Retinal hemorrhage 12/15/2010  . Diabetes mellitus type 2 with neurological manifestations 12/15/2010  . Alcohol use 12/07/2010  . Dilated cardiomyopathy   . Hypertension   . CVA (cerebral vascular accident) 11/28/2010    Jamey Reas PT, DPT 08/04/2014, 6:42 AM  [facility] [address] [place], [place], [address] Phone: [phone]   Fax:  [phone]

## 2014-08-05 ENCOUNTER — Ambulatory Visit: Payer: Medicare Other | Admitting: Speech Pathology

## 2014-08-10 ENCOUNTER — Encounter: Payer: Self-pay | Admitting: Physical Therapy

## 2014-08-10 ENCOUNTER — Other Ambulatory Visit: Payer: Self-pay | Admitting: Internal Medicine

## 2014-08-10 ENCOUNTER — Ambulatory Visit: Payer: Medicare Other | Admitting: Speech Pathology

## 2014-08-10 ENCOUNTER — Ambulatory Visit: Payer: Medicare Other | Admitting: Physical Therapy

## 2014-08-10 DIAGNOSIS — R42 Dizziness and giddiness: Secondary | ICD-10-CM | POA: Diagnosis not present

## 2014-08-10 DIAGNOSIS — R269 Unspecified abnormalities of gait and mobility: Secondary | ICD-10-CM | POA: Diagnosis not present

## 2014-08-10 DIAGNOSIS — E785 Hyperlipidemia, unspecified: Secondary | ICD-10-CM | POA: Diagnosis not present

## 2014-08-10 DIAGNOSIS — I69322 Dysarthria following cerebral infarction: Secondary | ICD-10-CM

## 2014-08-10 DIAGNOSIS — E1140 Type 2 diabetes mellitus with diabetic neuropathy, unspecified: Secondary | ICD-10-CM | POA: Diagnosis not present

## 2014-08-10 DIAGNOSIS — R2689 Other abnormalities of gait and mobility: Secondary | ICD-10-CM

## 2014-08-10 DIAGNOSIS — I69398 Other sequelae of cerebral infarction: Secondary | ICD-10-CM | POA: Diagnosis not present

## 2014-08-10 NOTE — Therapy (Signed)
[facility] [address] [place], [place], [address] Phone: [phone]   Fax:  [phone]  Speech Language Pathology Treatment  Patient Details  Name: Kevin Soto MRN: [id number] Date of Birth: [birth year] Referring Provider:  Juliet Rude, MD  Encounter Date: 08/10/2014      End of Session - 08/10/14 1007    Visit Number 3   Number of Visits 4   Date for SLP Re-Evaluation 09/02/14   SLP Start Time 0932   SLP Stop Time  1008   SLP Time Calculation (min) 36 min   Activity Tolerance Patient tolerated treatment well      Past Medical History  Diagnosis Date  . Diabetes mellitus   . Hypertension   . Macular hemorrhage   . Dyslipidemia   . Ischemic cardiomyopathy     echo 7/12: EF 40-45%, septal, apical, inf basal HK, mod LVH, mild MR, mild LAE  . Anemia   . CAD (coronary artery disease)     cath 8/12:  LM ok, mLAD occluded with trivial collats from right AM and dRCA, mDx (small) 80%, RI 25%, tiny branch of OM 90%, mAM 30-40%, RCA sub-totally occluded, then 80%, EF 40%, inf HK.  He underwent PCI with [name] with placement of a Promus DES to the mRCA x 2  . History of stroke     evidence of CVA on MRI in past  . History of alcohol abuse   . Vision, loss, sudden 02/27/2011  . Gait instability 02/27/2011  . Sleep apnea   . Hyperlipidemia   . Stroke 2012    uses cane to steady due to dizziness    Past Surgical History  Procedure Laterality Date  . Coronary angioplasty with stent placement  2012  . Refractive surgery  2012    right and left eye    There were no vitals filed for this visit.  Visit Diagnosis: Dysarthria as late effect of stroke      Subjective Assessment - 08/10/14 0939    Symptoms "He told me I did well with my pronunciation."   Currently in Pain? No/denies               ADULT SLP TREATMENT - 08/10/14 0940    General Information   Behavior/Cognition Alert;Pleasant  mood;Cooperative   Treatment Provided   Treatment provided Cognitive-Linquistic   Cognitive-Linquistic Treatment   Treatment focused on Dysarthria   Skilled Treatment Pt performed samples of HEP with independence. Structured speech tasks with compensations  with supervision cues and conversation with supervision cues - speech rate has become more natural and precise.  Pt demonstrated good carryover with dysarthria compensations with supervision.   Assessment / Recommendations / Plan   Plan Continue with current plan of care   Progression Toward Goals   Progression toward goals Progressing toward goals          SLP Education - 08/10/14 1004    Education provided Yes            SLP Long Term Goals - 08/10/14 1004    SLP LONG TERM GOAL #1   Title Pt will perform HEP for dysarthria with mod I (09/02/14)   Status Achieved   SLP LONG TERM GOAL #2   Title Pt will utilize/verbalize compensations for dysarthria with mod I (09/02/14)   Status Achieved   SLP LONG TERM GOAL #3   Title Pt will be 95% intelligible in noisy environement over 10 minute conversation with  rare min A (09/02/14)   Time 3   Period Weeks   Status On-going          Plan - 08/10/14 1008    Clinical Impression Statement Pt utlized compensations during conversation with supervision cues - Recommend continue skilled ST 1-2 more visits for carryover of compensations. Pt to continue HEP.   Speech Therapy Frequency 1x /week   Treatment/Interventions Functional tasks;Patient/family education;SLP instruction and feedback;Compensatory strategies   Potential to Achieve Goals Good   Consulted and Agree with Plan of Care Patient        Problem List Patient Active Problem List   Diagnosis Date Noted  . Diabetic neuropathy 06/13/2013  . Anemia 06/24/2012  . Healthcare maintenance 06/24/2012  . Gait instability 02/27/2011  . Dyslipidemia 02/16/2011  . Vitreous hemorrhage of left eye 01/26/2011  . CAD (coronary  artery disease) 01/26/2011  . Retinal hemorrhage 12/15/2010  . Diabetes mellitus type 2 with neurological manifestations 12/15/2010  . Alcohol use 12/07/2010  . Dilated cardiomyopathy   . Hypertension   . CVA (cerebral vascular accident) 11/28/2010    Talibah Colasurdo, Annye Rusk, SLP 08/10/2014, 10:10 AM  [facility] [address] [place], [place], [address] Phone: [phone]   Fax:  [phone]

## 2014-08-10 NOTE — Therapy (Signed)
[facility] [address] [place], [place], [address] Phone: [phone]   Fax:  [phone]  Physical Therapy Treatment  Patient Details  Name: Kevin Soto MRN: [id number] Date of Birth: 18-Sep-1960 Referring Provider:  Juliet Rude, MD  Encounter Date: 08/10/2014      PT End of Session - 08/10/14 1015    Visit Number 5   Number of Visits 5   Date for PT Re-Evaluation 08/13/14   PT Start Time 1015   PT Stop Time 1045   PT Time Calculation (min) 30 min   Equipment Utilized During Treatment --   Activity Tolerance Patient tolerated treatment well   Behavior During Therapy [facility] for tasks assessed/performed      Past Medical History  Diagnosis Date  . Diabetes mellitus   . Hypertension   . Macular hemorrhage   . Dyslipidemia   . Ischemic cardiomyopathy     echo 7/12: EF 40-45%, septal, apical, inf basal HK, mod LVH, mild MR, mild LAE  . Anemia   . CAD (coronary artery disease)     cath 8/12:  LM ok, mLAD occluded with trivial collats from right AM and dRCA, mDx (small) 80%, RI 25%, tiny branch of OM 90%, mAM 30-40%, RCA sub-totally occluded, then 80%, EF 40%, inf HK.  He underwent PCI with [name] with placement of a Promus DES to the mRCA x 2  . History of stroke     evidence of CVA on MRI in past  . History of alcohol abuse   . Vision, loss, sudden 02/27/2011  . Gait instability 02/27/2011  . Sleep apnea   . Hyperlipidemia   . Stroke 2012    uses cane to steady due to dizziness    Past Surgical History  Procedure Laterality Date  . Coronary angioplasty with stent placement  2012  . Refractive surgery  2012    right and left eye    There were no vitals filed for this visit.  Visit Diagnosis:  Abnormality of gait  Balance problems      Subjective Assessment - 08/10/14 1019    Symptoms Exercises are going fine. Combining with [facility] activiites.   Currently in Pain? No/denies       Patient and fiance verbalize independence and understanding of fitness plan / HEP.      [facility] PT Assessment - 08/10/14 1015    Ambulation/Gait   Ambulation/Gait Yes   Ambulation Distance (Feet) 700 Feet   Assistive device Straight cane   Gait Pattern Step-through pattern;Right circumduction;Decreased trunk rotation;Trunk flexed;Wide base of support   Ambulation Surface Level;Unlevel;Indoor;Outdoor;Paved;Gravel;Grass   Gait velocity 1.96 ft/sec   Stairs Yes   Stair Management Technique With cane;Step to pattern;One rail Right;Alternating pattern  cane & wall support similar to his home step-to modified ind   Number of Stairs 4  3 reps   Berg Balance Test   Sit to Stand Able to stand without using hands and stabilize independently   Standing Unsupported Able to stand safely 2 minutes   Sitting with Back Unsupported but Feet Supported on Floor or Stool Able to sit safely and securely 2 minutes   Stand to Sit Sits safely with minimal use of hands   Transfers Able to transfer safely, minor use of hands   Standing Unsupported with Eyes Closed Able to stand 10 seconds safely   Standing Ubsupported with Feet Together Able to place feet together independently and stand 1 minute safely  From Standing, Reach Forward with Outstretched Arm Can reach confidently >25 cm (10")   From Standing Position, Pick up Object from Alligator to pick up shoe safely and easily   From Standing Position, Turn to Look Behind Over each Shoulder Looks behind one side only/other side shows less weight shift   Turn 360 Degrees Able to turn 360 degrees safely but slowly   Standing Unsupported, Alternately Place Feet on Step/Stool Able to complete 4 steps without aid or supervision   Standing Unsupported, One Foot in Front Able to plae foot ahead of the other independently and hold 30 seconds   Standing on One Leg Tries to lift leg/unable to hold 3 seconds but remains standing independently   Total Score 47    Timed Up and Go Test   Normal TUG (seconds) 14.92  14.92 sec without device, 15.03 sec w/ cane   Cognitive TUG (seconds) 19.04  no device                   [facility] Adult PT Treatment/Exercise - 2014-08-21 1015    Ambulation/Gait   Ambulation/Gait Assistance 5: Supervision   Ambulation/Gait Assistance Details able to turn head to scan without balance loss   Stairs Assistance 5: Supervision   Stairs Assistance Details (indicate cue type and reason) safe negotiating simulation to home left wall & cane   Door Management 5: Supervision  single point cane   Ramp 5: Supervision  single point cane   Curb 5: Supervision  single point cane                     PT Long Term Goals - 08/21/2014 1015    PT LONG TERM GOAL #1   Title demonstrates & verbalizes understanding of progressive HEP for balance & gait. (Target Date: 08/13/14)   Baseline MET 2014/08/21   Time 4   Period Weeks   Status Achieved   PT LONG TERM GOAL #2   Title ambulates 400' outside including grass with single point cane with supervision.  (Target Date: 08/13/14)   Baseline MET 21-Aug-2014   Time 4   Period Weeks   Status Achieved   PT LONG TERM GOAL #3   Title Negotiates ramp, curb & stairs (1 rail) with single point cane with supervision.  (Target Date: 08/13/14)   Baseline MET 08/21/2014   Time 4   Period Weeks   Status Achieved   PT LONG TERM GOAL #4   Title ambulates with single point cane with head turns to scan environment without balance loss with supervision.  (Target Date: 08/13/14)   Baseline MET 2014/08/21   Time 4   Period Weeks   Status Achieved               Plan - Aug 21, 2014 1015    Clinical Impression Statement Patient met all LTGs.    Pt will benefit from skilled therapeutic intervention in order to improve on the following deficits Abnormal gait;Decreased balance;Decreased knowledge of precautions;Decreased knowledge of use of DME;Decreased mobility   Rehab Potential Good   PT  Frequency 1x / week   PT Duration 4 weeks   PT Treatment/Interventions DME Instruction;Gait training;Stair training;Functional mobility training;Therapeutic activities;Therapeutic exercise;Balance training;Neuromuscular re-education   PT Next Visit Plan discharge   Consulted and Agree with Plan of Care Patient;Family member/caregiver   Family Member Consulted fiance          G-Codes - 08-21-14 1045    Functional Assessment Tool Used verbalizes  understanding of fitness plan / HEP with fiance guidance.   Functional Limitation Self care   Self Care Goal Status [phone]) At least 20 percent but less than 40 percent impaired, limited or restricted   Self Care Discharge Status [phone]) At least 20 percent but less than 40 percent impaired, limited or restricted      Problem List Patient Active Problem List   Diagnosis Date Noted  . Diabetic neuropathy 06/13/2013  . Anemia 06/24/2012  . Healthcare maintenance 06/24/2012  . Gait instability 02/27/2011  . Dyslipidemia 02/16/2011  . Vitreous hemorrhage of left eye 01/26/2011  . CAD (coronary artery disease) 01/26/2011  . Retinal hemorrhage 12/15/2010  . Diabetes mellitus type 2 with neurological manifestations 12/15/2010  . Alcohol use 12/07/2010  . Dilated cardiomyopathy   . Hypertension   . CVA (cerebral vascular accident) 11/28/2010    Jamey Reas PT, DPT 08/10/2014, 9:02 PM  [facility] [address] [place], [place], [address] Phone: [phone]   Fax:  [phone]

## 2014-08-10 NOTE — Therapy (Signed)
[facility] [address] [place], [place], [address] Phone: [phone]   Fax:  [phone]  Patient Details  Name: Kevin Soto MRN: [id number] Date of Birth: 1961/05/19 Referring Provider:  No ref. provider found  Encounter Date: 08/10/2014  PHYSICAL THERAPY DISCHARGE SUMMARY  Visits from Start of Care: 5  Current functional level related to goals / functional outcomes:     PT Long Term Goals - 08/10/14 1015    PT LONG TERM GOAL #1   Title demonstrates & verbalizes understanding of progressive HEP for balance & gait. (Target Date: 08/13/14)   Baseline MET 08/10/14   Time 4   Period Weeks   Status Achieved   PT LONG TERM GOAL #2   Title ambulates 400' outside including grass with single point cane with supervision.  (Target Date: 08/13/14)   Baseline MET 08/10/14   Time 4   Period Weeks   Status Achieved   PT LONG TERM GOAL #3   Title Negotiates ramp, curb & stairs (1 rail) with single point cane with supervision.  (Target Date: 08/13/14)   Baseline MET 08/10/14   Time 4   Period Weeks   Status Achieved   PT LONG TERM GOAL #4   Title ambulates with single point cane with head turns to scan environment without balance loss with supervision.  (Target Date: 08/13/14)   Baseline MET 08/10/14   Time 4   Period Weeks   Status Achieved       Remaining deficits: Patient has balance deficits noted by Merrilee Jansky Balance 47/56 and Timed Up-Go 14.92sec.   Education / Equipment: Charity fundraiser. Plan: Patient agrees to discharge.  Patient goals were met. Patient is being discharged due to meeting the stated rehab goals.  ?????       Siren Porrata PT, DPT 08/10/2014, 9:06 PM  [facility] [address] [place], [place], [address] Phone: [phone]   Fax:  [phone]

## 2014-08-12 ENCOUNTER — Ambulatory Visit: Payer: Medicare Other

## 2014-08-17 ENCOUNTER — Ambulatory Visit: Payer: Medicare Other | Admitting: Speech Pathology

## 2014-08-17 ENCOUNTER — Ambulatory Visit (INDEPENDENT_AMBULATORY_CARE_PROVIDER_SITE_OTHER): Payer: Medicare Other | Admitting: Internal Medicine

## 2014-08-17 ENCOUNTER — Encounter: Payer: Self-pay | Admitting: Internal Medicine

## 2014-08-17 VITALS — BP 135/74 | HR 66 | Temp 98.3°F | Ht 72.0 in | Wt 228.1 lb

## 2014-08-17 DIAGNOSIS — I420 Dilated cardiomyopathy: Secondary | ICD-10-CM

## 2014-08-17 DIAGNOSIS — I69322 Dysarthria following cerebral infarction: Secondary | ICD-10-CM | POA: Diagnosis not present

## 2014-08-17 DIAGNOSIS — E1122 Type 2 diabetes mellitus with diabetic chronic kidney disease: Secondary | ICD-10-CM

## 2014-08-17 DIAGNOSIS — E1140 Type 2 diabetes mellitus with diabetic neuropathy, unspecified: Secondary | ICD-10-CM | POA: Diagnosis not present

## 2014-08-17 DIAGNOSIS — R42 Dizziness and giddiness: Secondary | ICD-10-CM | POA: Diagnosis not present

## 2014-08-17 DIAGNOSIS — D649 Anemia, unspecified: Secondary | ICD-10-CM | POA: Diagnosis not present

## 2014-08-17 DIAGNOSIS — N183 Chronic kidney disease, stage 3 unspecified: Secondary | ICD-10-CM

## 2014-08-17 DIAGNOSIS — I129 Hypertensive chronic kidney disease with stage 1 through stage 4 chronic kidney disease, or unspecified chronic kidney disease: Secondary | ICD-10-CM

## 2014-08-17 DIAGNOSIS — I2510 Atherosclerotic heart disease of native coronary artery without angina pectoris: Secondary | ICD-10-CM | POA: Diagnosis not present

## 2014-08-17 DIAGNOSIS — E1149 Type 2 diabetes mellitus with other diabetic neurological complication: Secondary | ICD-10-CM

## 2014-08-17 DIAGNOSIS — I10 Essential (primary) hypertension: Secondary | ICD-10-CM

## 2014-08-17 DIAGNOSIS — I69398 Other sequelae of cerebral infarction: Secondary | ICD-10-CM | POA: Diagnosis not present

## 2014-08-17 DIAGNOSIS — Z79899 Other long term (current) drug therapy: Secondary | ICD-10-CM

## 2014-08-17 DIAGNOSIS — Z7982 Long term (current) use of aspirin: Secondary | ICD-10-CM

## 2014-08-17 DIAGNOSIS — R269 Unspecified abnormalities of gait and mobility: Secondary | ICD-10-CM | POA: Diagnosis not present

## 2014-08-17 DIAGNOSIS — E785 Hyperlipidemia, unspecified: Secondary | ICD-10-CM | POA: Diagnosis not present

## 2014-08-17 LAB — BASIC METABOLIC PANEL WITH GFR
BUN: 27 mg/dL — ABNORMAL HIGH (ref 6–23)
CO2: 27 meq/L (ref 19–32)
Calcium: 9.5 mg/dL (ref 8.4–10.5)
Chloride: 101 mEq/L (ref 96–112)
Creat: 1.73 mg/dL — ABNORMAL HIGH (ref 0.50–1.35)
GFR, Est African American: 50 mL/min — ABNORMAL LOW
GFR, Est Non African American: 43 mL/min — ABNORMAL LOW
Glucose, Bld: 123 mg/dL — ABNORMAL HIGH (ref 70–99)
Potassium: 4.8 mEq/L (ref 3.5–5.3)
Sodium: 136 mEq/L (ref 135–145)

## 2014-08-17 LAB — GLUCOSE, CAPILLARY: GLUCOSE-CAPILLARY: 140 mg/dL — AB (ref 70–99)

## 2014-08-17 LAB — CBC
HCT: 35.6 % — ABNORMAL LOW (ref 39.0–52.0)
Hemoglobin: 11.2 g/dL — ABNORMAL LOW (ref 13.0–17.0)
MCH: 27.8 pg (ref 26.0–34.0)
MCHC: 31.5 g/dL (ref 30.0–36.0)
MCV: 88.3 fL (ref 78.0–100.0)
MPV: 8.9 fL (ref 8.6–12.4)
PLATELETS: 322 10*3/uL (ref 150–400)
RBC: 4.03 MIL/uL — ABNORMAL LOW (ref 4.22–5.81)
RDW: 14.4 % (ref 11.5–15.5)
WBC: 9.2 10*3/uL (ref 4.0–10.5)

## 2014-08-17 LAB — LIPID PANEL
CHOLESTEROL: 146 mg/dL (ref 0–200)
HDL: 44 mg/dL (ref >=40)
LDL Cholesterol: 85 mg/dL (ref 0–99)
TRIGLYCERIDES: 86 mg/dL (ref <150)
Total CHOL/HDL Ratio: 3.3 Ratio
VLDL: 17 mg/dL (ref 0–40)

## 2014-08-17 LAB — POCT GLYCOSYLATED HEMOGLOBIN (HGB A1C): HEMOGLOBIN A1C: 7.9

## 2014-08-17 NOTE — Progress Notes (Signed)
   Subjective:    Patient ID: Kevin Soto, male    DOB: [birth year], 54 y.o.   MRN: [id number]  HPI Kevin Soto is a 54yo man with PMHx of HTN, CAD, Type 2 DM, and CVA who presents today for the following:  1. HTN: BP today 135/74. He takes Coreg 25 mg BID, HCTZ 25 mg daily, Accupril 10 mg daily, and Spironolactone 25 mg daily at home.   2. Type 2 DM: Last HbA1c 7.4 on 12/25/13. He takes Glipizide 5 mg daily at home. He denies polyuria and polydipsia. He notes his vision in his left eye has been blurry ever since his stroke. He is scheduled to see an optometrist next month. His last diabetic ophthalmology exam was in Aug 2015 which showed stable proliferative diabetic retinopathy.   3. HLD: Last lipid profile on 08/28/13 showed Chol 146, Trigly 41, HDL 47, and LDL 91. Patient takes Crestor 5 mg daily.   4. Anemia: Patient noted to have Hbg 9.1 in 2012 with work up including an anemia panel showing iron 42, TIBC 327, ferritin 230, Vit B12 287 (low normal), and methylmalonic acid 0.39 (normal). He was referred for a colonoscopy to rule out GI bleeding. Colonoscopy on 06/25/12 was normal. A repeat anemia panel was done on 06/24/12 which showed iron 86, TIBC 327, ferritin 232, vit B12 596, and folate 12.9. His MCV is normal in 86-88 range. Retic count normal at 1.5. Protein electrophoresis was done on 08/07/12 which revealed slightly elevated kappa and lambda light chains. He denies any melena, hematochezia, hematemesis, and hematuria.   5. CAD/Dilated Cardiomyopathy: Patient denies any chest pain, dyspnea on exertion, palpitations. His last echo in Dec 2014 showed a normal EF. He is followed by [name].   6. Hx CVA: Patient has history of ischemic stroke. He has been doing well at  Rehab with PT, OT, and speech. He is on aspirin 81 mg daily and plavix 75 mg daily.   Review of Systems General: Denies fever, chills, night sweats, changes in weight, changes in appetite HEENT: Denies headaches, ear  pain, changes in vision, rhinorrhea, sore throat CV: Denies CP, palpitations, SOB, orthopnea Pulm: Denies SOB, cough, wheezing GI: Denies abdominal pain, nausea, vomiting, diarrhea, constipation, melena, hematochezia GU: Denies dysuria, hematuria, frequency Msk: Denies muscle cramps, joint pains Neuro: Denies weakness, numbness, tingling Skin: Denies rashes, bruising    Objective:   Physical Exam General: sitting up in chair, pleasant, NAD HEENT: [place]/AT, EOMI, sclera anicteric, mucus membranes moist  CV: RRR, no m/g/r Pulm: CTA bilaterally, breaths non-labored  Abd: BS+, soft, non-tender, non-distended  Ext: warm, no edema, moves all Neuro: alert and oriented x 3, strength intact in all extremities, CNs II-XII grossly intact     Assessment & Plan:

## 2014-08-17 NOTE — Patient Instructions (Signed)
It was a pleasure seeing you today, Kevin Soto.  1. Blood pressure - Continue home medications - BP excellent today!  2. Type 2 Diabetes - Continue glipizide - Checking Hemoglobin A1c today  3. High cholesterol - Checking lipid panel today  General Instructions:   Please bring your medicines with you each time you come to clinic.  Medicines may include prescription medications, over-the-counter medications, herbal remedies, eye drops, vitamins, or other pills.   Progress Toward Treatment Goals:  Treatment Goal 12/25/2013  Hemoglobin A1C deteriorated  Blood pressure at goal    Self Care Goals & Plans:  Self Care Goal 12/25/2013  Manage my medications take my medicines as prescribed; bring my medications to every visit; refill my medications on time  Eat healthy foods drink diet soda or water instead of juice or soda; eat more vegetables; eat foods that are low in salt; eat baked foods instead of fried foods; eat fruit for snacks and desserts    Home Blood Glucose Monitoring 12/25/2013  Check my blood sugar once a day  When to check my blood sugar N/A     Care Management & Community Referrals:  No flowsheet data found.

## 2014-08-17 NOTE — Patient Instructions (Signed)
A SLY FLY FLEW INTO GUY'S EYE.  I KNOW YOU SHOULD GO SLOW THROUGH THE SNOW, JOE.  JAKE BAKED A FLAKY CAKE  CRAM THE HAM AND THE LAMB IN THE PAN  SAM SWAM ACROSS THE DAM  THE LAME DAME CAME TO THE SAME GAME.  I CAN'T CHANT WHEN I PANT.  THE GANG SANG SLANG WITH A BANG.  TAN DAN RAN WITH JAN AND AND THE MAN.  THE GUEST TOOK THE BEST TEST AFTER A REST.  IT'S FREE TO SKI WITH ME AROUND THE THREE TREES.  NELL YELLED WHEN SHE SMELLED THE SHELL.  THE VET'S PET WAS WET WITH SWEAT.  BESS'S DRESS COST LESS THAN JESS'S PRINTING PRESS.  THE SNAIL SCALED THE SIDE OF THE FRAIL PAIL.  SHE WAS SWORN TO FILL THE HORN WITH CORN EVERY MORN.  DOC HEARD A KNOW FROM A ROCK DOWN THE BLOCK.   CHIP, DON'T SKIP, SLIP, OR TRIP OVER THE STRIP.  SCOTT GOT A HOT COT ON THE YACHT.  THE PLUMP UMP STOOD BY THE STUMP IN THE CLUMP OF TREES.

## 2014-08-17 NOTE — Therapy (Signed)
[facility] [address] [place], [place], [address] Phone: [phone]   Fax:  [phone]  Speech Language Pathology Treatment  Patient Details  Name: Kevin Soto MRN: [id number] Date of Birth: [birth year] Referring Provider:  Juliet Rude, MD  Encounter Date: 08/17/2014      End of Session - 08/17/14 1010    Visit Number 4   Number of Visits 4   Date for SLP Re-Evaluation 09/02/14   SLP Start Time 0933   SLP Stop Time  1011   SLP Time Calculation (min) 38 min   Activity Tolerance Patient tolerated treatment well      Past Medical History  Diagnosis Date  . Diabetes mellitus   . Hypertension   . Macular hemorrhage   . Dyslipidemia   . Ischemic cardiomyopathy     echo 7/12: EF 40-45%, septal, apical, inf basal HK, mod LVH, mild MR, mild LAE  . Anemia   . CAD (coronary artery disease)     cath 8/12:  LM ok, mLAD occluded with trivial collats from right AM and dRCA, mDx (small) 80%, RI 25%, tiny branch of OM 90%, mAM 30-40%, RCA sub-totally occluded, then 80%, EF 40%, inf HK.  He underwent PCI with [name] with placement of a Promus DES to the mRCA x 2  . History of stroke     evidence of CVA on MRI in past  . History of alcohol abuse   . Vision, loss, sudden 02/27/2011  . Gait instability 02/27/2011  . Sleep apnea   . Hyperlipidemia   . Stroke 2012    uses cane to steady due to dizziness    Past Surgical History  Procedure Laterality Date  . Coronary angioplasty with stent placement  2012  . Refractive surgery  2012    right and left eye    There were no vitals filed for this visit.  Visit Diagnosis: Dysarthria as late effect of stroke      Subjective Assessment - 08/17/14 0939    Symptoms "We got 4 Dr. Deatra James books"               ADULT SLP TREATMENT - 08/17/14 0940    General Information   Behavior/Cognition Alert;Pleasant mood;Cooperative   Treatment Provided   Treatment  provided Cognitive-Linquistic   Pain Assessment   Pain Assessment No/denies pain   Cognitive-Linquistic Treatment   Treatment focused on Dysarthria   Skilled Treatment HEP with I, Added to HEP tongue twister, rhyming sentences. Conversational speech with  compensations  with supervision cues.    Assessment / Recommendations / Plan   Plan Continue with current plan of care   Progression Toward Goals   Progression toward goals Progressing toward goals          SLP Education - 08/17/14 1008    Education provided Yes   Education Details practice 15 minute conversation focusing on speech   Person(s) Educated Patient   Methods Explanation;Demonstration   Comprehension Verbalized understanding;Returned demonstration            SLP Long Term Goals - 08/17/14 1016    SLP LONG TERM GOAL #1   Title Pt will perform HEP for dysarthria with mod I (09/02/14)   Status Achieved   SLP LONG TERM GOAL #2   Title Pt will utilize/verbalize compensations for dysarthria with mod I (09/02/14)   Status Achieved   SLP LONG TERM GOAL #3   Title Pt will be 95% intelligible in  noisy environement over 10 minute conversation with rare min A (09/02/14)   Time 3   Period Weeks   Status On-going          Plan - 08/17/14 1012    Clinical Impression Statement Pt making good progress with HEP - goal met. Continue 1 session next week, then d/c if carryover of compensations contunue.   Speech Therapy Frequency 1x /week   Treatment/Interventions Functional tasks;Patient/family education;SLP instruction and feedback;Compensatory strategies   Potential to Achieve Goals Good   Consulted and Agree with Plan of Care Patient        Problem List Patient Active Problem List   Diagnosis Date Noted  . Diabetic neuropathy 06/13/2013  . Anemia 06/24/2012  . Healthcare maintenance 06/24/2012  . Gait instability 02/27/2011  . Dyslipidemia 02/16/2011  . Vitreous hemorrhage of left eye 01/26/2011  . CAD  (coronary artery disease) 01/26/2011  . Retinal hemorrhage 12/15/2010  . Diabetes mellitus type 2 with neurological manifestations 12/15/2010  . Alcohol use 12/07/2010  . Dilated cardiomyopathy   . Hypertension   . CVA (cerebral vascular accident) 11/28/2010    Tocarra Gassen, Annye Rusk, SLP 08/17/2014, 10:16 AM  [facility] [address] [place], [place], [address] Phone: [phone]   Fax:  [phone]

## 2014-08-18 DIAGNOSIS — N183 Chronic kidney disease, stage 3 unspecified: Secondary | ICD-10-CM | POA: Insufficient documentation

## 2014-08-18 DIAGNOSIS — N184 Chronic kidney disease, stage 4 (severe): Secondary | ICD-10-CM | POA: Insufficient documentation

## 2014-08-18 DIAGNOSIS — E1122 Type 2 diabetes mellitus with diabetic chronic kidney disease: Secondary | ICD-10-CM | POA: Insufficient documentation

## 2014-08-18 MED ORDER — QUINAPRIL HCL 20 MG PO TABS: 20.0000 mg | ORAL_TABLET | Freq: Every day | ORAL | Status: DC

## 2014-08-18 NOTE — Assessment & Plan Note (Signed)
BP Readings from Last 3 Encounters:  08/17/14 135/74  04/30/14 150/82  12/25/13 128/77    Lab Results  Component Value Date   NA 136 08/17/2014   K 4.8 08/17/2014   CREATININE 1.73* 08/17/2014    Assessment: Blood pressure control: controlled Progress toward BP goal:  at goal  Plan: Medications:  continue current medications  BP well controlled. Will continue Coreg 25 mg BID, HCTZ 25 mg daily, Accupril 10 mg daily, and Spironolactone 25 mg daily. Of note, patient had Olmesartan on his medication list and not Accupril. He states he takes "half a tablet" of Accupril daily. He did not know the exact dose. I saw that he was taking Accupril 20 mg daily per his last cardiology visit. I asked patient to bring in his medications at his next visit so the dose can be confirmed.

## 2014-08-18 NOTE — Assessment & Plan Note (Signed)
Lab Results  Component Value Date   HGBA1C 7.9 08/17/2014   HGBA1C 7.4 12/25/2013   HGBA1C 6.6 08/28/2013     Assessment: Diabetes control: fair control Progress toward A1C goal:  deteriorated  Plan: Medications:  continue current medications Instruction/counseling given: reminded to bring medications to each visit and discussed foot care  HbA1c has increased from 7.4 in Aug 2015 to 7.9 today. He is taking Glipizide 5 mg daily. Consider increasing Glipizide to 10 mg daily if HbA1c continues to be elevated in 3 months.

## 2014-08-18 NOTE — Assessment & Plan Note (Signed)
-   Repeat lipid profile today>>> Chol 146, Trigly 86, HDL 44, LDL 85. Would like for LDL to be less than 70 given his diabetes.  - Will continue Crestor 5 mg daily

## 2014-08-18 NOTE — Assessment & Plan Note (Signed)
Patient has not had work up for his kidney disease before. His GFR noted to be 52 in August 2014 and then 50 on today's labs. He does have long standing history of HTN and diabetes which is the most likely cause for his renal disease. His Cr has been in 1.5-1.6 range but has increased to 1.7 in the last few months. He has not had a urine microalbumin which is surprising given his diabetes.  - Will get urine microalbumin at next visit to make official classification of his CKD - Continue to monitor

## 2014-08-18 NOTE — Progress Notes (Signed)
Case discussed with Dr. Rivet soon after the resident saw the patient. We reviewed the resident's history and exam and pertinent patient test results. I agree with the assessment, diagnosis, and plan of care documented in the resident's note. 

## 2014-08-18 NOTE — Assessment & Plan Note (Signed)
EF normal per last echo. He is followed by [name]. Will continue Coreg 25 mg BID, HCTZ 25 mg daily, Accupril 20 mg daily, and Spironolactone 25 mg daily.

## 2014-08-18 NOTE — Assessment & Plan Note (Signed)
Denies any chest pain, dyspnea, palpitations. Last saw [name] in Dec 2015.  - Continue aspirin 81 mg daily, Coreg 25 mg BID, HCTZ 25 mg daily, Accupril 20 mg daily, Spironolactone 25 mg daily, and Crestor 5 mg daily

## 2014-08-18 NOTE — Assessment & Plan Note (Signed)
Patient had extensive work up in past for anemia which shows a normocytic normochromic anemia. He was noted to have Hbg in 9-10 range since 2012 with work up including an anemia panel showing iron 42, TIBC 327, ferritin 230, Vit B12 287 (low normal), and methylmalonic acid 0.39 (normal). He was referred for a colonoscopy to rule out GI bleeding. Colonoscopy on 06/25/12 was normal. A repeat anemia panel was done on 06/24/12 which showed iron 86, TIBC 327, ferritin 232, vit B12 596, and folate 12.9. His MCV is normal in 86-88 range. Retic count normal at 1.5. Protein electrophoresis was done on 08/07/12 which revealed slightly elevated kappa and lambda light chains. He saw Dr. Alen Blew (heme-onc) in March 2014 and he believed the anemia was likely multifactorial and most likely anemia of chronic disease. Patient does have renal insufficiency. His EPO was noted to be inappropriately normal at 5.6.  Dr. Alen Blew recommended monitoring for now and considering a bone marrow biopsy if he develops other cytopenias.  - Repeat CBC today>> Hbg 11.2, MCV 88. No other cytopenias present.  - Hbg higher than has been in past. Will continue to monitor for now.

## 2014-08-26 ENCOUNTER — Ambulatory Visit: Payer: Medicare Other | Attending: Ophthalmology | Admitting: Speech Pathology

## 2014-08-26 DIAGNOSIS — I69398 Other sequelae of cerebral infarction: Secondary | ICD-10-CM | POA: Insufficient documentation

## 2014-08-26 DIAGNOSIS — R42 Dizziness and giddiness: Secondary | ICD-10-CM | POA: Insufficient documentation

## 2014-08-26 DIAGNOSIS — I2510 Atherosclerotic heart disease of native coronary artery without angina pectoris: Secondary | ICD-10-CM | POA: Insufficient documentation

## 2014-08-26 DIAGNOSIS — I10 Essential (primary) hypertension: Secondary | ICD-10-CM | POA: Insufficient documentation

## 2014-08-26 DIAGNOSIS — E1140 Type 2 diabetes mellitus with diabetic neuropathy, unspecified: Secondary | ICD-10-CM | POA: Insufficient documentation

## 2014-08-26 DIAGNOSIS — I420 Dilated cardiomyopathy: Secondary | ICD-10-CM | POA: Insufficient documentation

## 2014-08-26 DIAGNOSIS — I69322 Dysarthria following cerebral infarction: Secondary | ICD-10-CM | POA: Insufficient documentation

## 2014-08-26 DIAGNOSIS — Z95818 Presence of other cardiac implants and grafts: Secondary | ICD-10-CM | POA: Insufficient documentation

## 2014-08-26 DIAGNOSIS — G4730 Sleep apnea, unspecified: Secondary | ICD-10-CM | POA: Insufficient documentation

## 2014-08-26 DIAGNOSIS — R269 Unspecified abnormalities of gait and mobility: Secondary | ICD-10-CM | POA: Insufficient documentation

## 2014-08-26 DIAGNOSIS — E785 Hyperlipidemia, unspecified: Secondary | ICD-10-CM | POA: Insufficient documentation

## 2014-08-26 NOTE — Therapy (Signed)
[facility] [address] [place], [place], [address] Phone: [phone]   Fax:  [phone]  Speech Language Pathology Treatment  Patient Details  Name: Kevin Soto MRN: [id number] Date of Birth: May 29, 1960 Referring Provider:  Juliet Rude, MD  Encounter Date: 08/26/2014      End of Session - 08/26/14 1008    Visit Number 5   Number of Visits 5   SLP Start Time 0932   SLP Stop Time  0768   SLP Time Calculation (min) 36 min   Activity Tolerance Patient tolerated treatment well      Past Medical History  Diagnosis Date  . Diabetes mellitus   . Hypertension   . Macular hemorrhage   . Dyslipidemia   . Ischemic cardiomyopathy     echo 7/12: EF 40-45%, septal, apical, inf basal HK, mod LVH, mild MR, mild LAE  . Anemia   . CAD (coronary artery disease)     cath 8/12:  LM ok, mLAD occluded with trivial collats from right AM and dRCA, mDx (small) 80%, RI 25%, tiny branch of OM 90%, mAM 30-40%, RCA sub-totally occluded, then 80%, EF 40%, inf HK.  He underwent PCI with [name] with placement of a Promus DES to the mRCA x 2  . History of stroke     evidence of CVA on MRI in past  . History of alcohol abuse   . Vision, loss, sudden 02/27/2011  . Gait instability 02/27/2011  . Sleep apnea   . Hyperlipidemia   . Stroke 2012    uses cane to steady due to dizziness    Past Surgical History  Procedure Laterality Date  . Coronary angioplasty with stent placement  2012  . Refractive surgery  2012    right and left eye    There were no vitals filed for this visit.  Visit Diagnosis: Dysarthria as late effect of stroke      Subjective Assessment - 08/26/14 0941    Subjective "We got engaged"   Currently in Pain? No/denies               ADULT SLP TREATMENT - 08/26/14 0942    General Information   Behavior/Cognition Alert;Pleasant mood;Cooperative   Treatment Provided   Treatment provided  Cognitive-Linquistic   Cognitive-Linquistic Treatment   Treatment focused on Dysarthria   Skilled Treatment Pt I with HEP. He reports his speech "has come along way." Conversation speech is 100%  intelligible, with rate improved and rate is not distracting. Pt judged his speech to be 95% when compared to his speech pre-stroke.    Assessment / Recommendations / Plan   Plan Continue with current plan of care   Progression Toward Goals   Progression toward goals Progressing toward goals          SLP Education - 08/26/14 1007    Education provided Yes   Education Details continue HEP upon d/c   Person(s) Educated Patient   Methods Explanation;Demonstration   Comprehension Verbalized understanding;Returned demonstration     SPEECH THERAPY DISCHARGE SUMMARY  Visits from Start of Care: 5  Current functional level related to goals / functional outcomes: See goals below   Remaining deficits: Dysarthria may recur with fatigue of illness.    Education / Equipment: HEP, compensations for dysarthira Plan: Patient agrees to discharge.  Patient goals were not met. Patient is being discharged due to meeting the stated rehab goals.  ?????  SLP Long Term Goals - Sep 24, 2014 1008    SLP LONG TERM GOAL #1   Title Pt will perform HEP for dysarthria with mod I (09/02/14)   Status Achieved   SLP LONG TERM GOAL #2   Title Pt will utilize/verbalize compensations for dysarthria with mod I (09/02/14)   Status Achieved   SLP LONG TERM GOAL #3   Title Pt will be 95% intelligible in noisy environement over 10 minute conversation with rare min A (09/02/14)   Time 3   Period Weeks   Status Achieved          Plan - Sep 24, 2014 1009    Clinical Impression Statement Goals met - pt rates his speech as 95% when compared to his speech pre-stroke. Carry over reported by pt and his fiancee. Pt I with HEP. D/c ST.   Treatment/Interventions Functional tasks;Patient/family education;SLP  instruction and feedback;Compensatory strategies   Potential to Achieve Goals Good   Potential Considerations Other (comment)   Consulted and Agree with Plan of Care Patient          G-Codes - Sep 24, 2014 1012    Functional Assessment Tool Used NOMS   Functional Limitations Motor speech   Motor Speech Goal Status [phone]) At least 1 percent but less than 20 percent impaired, limited or restricted   Motor Speech Goal Status (G5087) At least 1 percent but less than 20 percent impaired, limited or restricted      Problem List Patient Active Problem List   Diagnosis Date Noted  . CKD (chronic kidney disease), stage III 08/18/2014  . Diabetic neuropathy 06/13/2013  . Anemia 06/24/2012  . Healthcare maintenance 06/24/2012  . Gait instability 02/27/2011  . Dyslipidemia 02/16/2011  . Vitreous hemorrhage of left eye 01/26/2011  . CAD (coronary artery disease) 01/26/2011  . Retinal hemorrhage 12/15/2010  . Diabetes mellitus type 2 with neurological manifestations 12/15/2010  . Alcohol use 12/07/2010  . Dilated cardiomyopathy   . Hypertension   . CVA (cerebral vascular accident) 11/28/2010    Lovvorn, Annye Rusk, SLP 09-24-14, 10:14 AM  [facility] [address] [place], [place], [address] Phone: [phone]   Fax:  [phone]

## 2014-08-30 DIAGNOSIS — H3342 Traction detachment of retina, left eye: Secondary | ICD-10-CM | POA: Diagnosis not present

## 2014-08-30 DIAGNOSIS — E11351 Type 2 diabetes mellitus with proliferative diabetic retinopathy with macular edema: Secondary | ICD-10-CM | POA: Diagnosis not present

## 2014-08-30 DIAGNOSIS — T85398D Other mechanical complication of other ocular prosthetic devices, implants and grafts, subsequent encounter: Secondary | ICD-10-CM | POA: Diagnosis not present

## 2014-09-13 ENCOUNTER — Other Ambulatory Visit: Payer: Self-pay | Admitting: Internal Medicine

## 2014-09-13 DIAGNOSIS — E1149 Type 2 diabetes mellitus with other diabetic neurological complication: Secondary | ICD-10-CM

## 2014-09-13 DIAGNOSIS — I10 Essential (primary) hypertension: Secondary | ICD-10-CM

## 2014-09-13 MED ORDER — HYDROCHLOROTHIAZIDE 25 MG PO TABS: 25.0000 mg | ORAL_TABLET | Freq: Every day | ORAL | Status: DC

## 2014-09-13 MED ORDER — GLIPIZIDE 5 MG PO TABS: 5.0000 mg | ORAL_TABLET | Freq: Every day | ORAL | Status: DC

## 2014-09-15 ENCOUNTER — Telehealth: Payer: Self-pay | Admitting: *Deleted

## 2014-09-15 NOTE — Telephone Encounter (Signed)
Per Dr. Alen Blew, I left a message for patient stating that his counts are good and no follow up is needed. Instructed patient to call [phone] if he had any questions.

## 2014-09-15 NOTE — Telephone Encounter (Signed)
-----   Message from Wyatt Portela, MD sent at 09/15/2014  1:16 PM EDT ----- Contact: [phone]  Please let him know that his counts are better than they were previosuly. No follow up is needed.   Thanks.     ----- Message -----    From: Barbera Setters    Sent: 09/15/2014   1:00 PM      To: Wyatt Portela, MD, Barbera Setters  Patient was wondering if patient is needing to have a follow up visit. Last time seen was 06/25/13. Please advise.  Thank you

## 2014-09-29 ENCOUNTER — Other Ambulatory Visit: Payer: Self-pay | Admitting: *Deleted

## 2014-09-29 DIAGNOSIS — I10 Essential (primary) hypertension: Secondary | ICD-10-CM

## 2014-09-29 DIAGNOSIS — I420 Dilated cardiomyopathy: Secondary | ICD-10-CM

## 2014-09-29 DIAGNOSIS — E785 Hyperlipidemia, unspecified: Secondary | ICD-10-CM

## 2014-09-30 MED ORDER — SPIRONOLACTONE 25 MG PO TABS: 25.0000 mg | ORAL_TABLET | Freq: Every day | ORAL | Status: DC

## 2014-09-30 MED ORDER — NITROGLYCERIN 0.4 MG SL SUBL: 0.4000 mg | SUBLINGUAL_TABLET | SUBLINGUAL | Status: DC | PRN

## 2014-09-30 MED ORDER — QUINAPRIL HCL 20 MG PO TABS: 20.0000 mg | ORAL_TABLET | Freq: Every day | ORAL | Status: DC

## 2014-09-30 MED ORDER — GLIPIZIDE 5 MG PO TABS: 5.0000 mg | ORAL_TABLET | Freq: Every day | ORAL | Status: DC

## 2014-09-30 MED ORDER — CARVEDILOL 25 MG PO TABS
25.0000 mg | ORAL_TABLET | Freq: Two times a day (BID) | ORAL | Status: DC
Start: 2014-09-30 — End: 2015-06-12

## 2014-09-30 MED ORDER — ROSUVASTATIN CALCIUM 5 MG PO TABS: 5.0000 mg | ORAL_TABLET | Freq: Every day | ORAL | Status: DC

## 2014-09-30 MED ORDER — HYDROCHLOROTHIAZIDE 25 MG PO TABS: 25.0000 mg | ORAL_TABLET | Freq: Every day | ORAL | Status: DC

## 2014-09-30 MED ORDER — CLOPIDOGREL BISULFATE 75 MG PO TABS: 75.0000 mg | ORAL_TABLET | Freq: Every day | ORAL | Status: DC

## 2014-10-26 ENCOUNTER — Encounter: Payer: Self-pay | Admitting: Internal Medicine

## 2014-10-26 ENCOUNTER — Ambulatory Visit (INDEPENDENT_AMBULATORY_CARE_PROVIDER_SITE_OTHER): Payer: Medicare Other | Admitting: Internal Medicine

## 2014-10-26 VITALS — BP 148/82 | HR 65 | Temp 98.5°F | Ht 72.0 in | Wt 229.8 lb

## 2014-10-26 DIAGNOSIS — I129 Hypertensive chronic kidney disease with stage 1 through stage 4 chronic kidney disease, or unspecified chronic kidney disease: Secondary | ICD-10-CM

## 2014-10-26 DIAGNOSIS — E1165 Type 2 diabetes mellitus with hyperglycemia: Secondary | ICD-10-CM | POA: Diagnosis not present

## 2014-10-26 DIAGNOSIS — E1142 Type 2 diabetes mellitus with diabetic polyneuropathy: Secondary | ICD-10-CM | POA: Diagnosis not present

## 2014-10-26 DIAGNOSIS — N183 Chronic kidney disease, stage 3 unspecified: Secondary | ICD-10-CM

## 2014-10-26 DIAGNOSIS — I10 Essential (primary) hypertension: Secondary | ICD-10-CM

## 2014-10-26 DIAGNOSIS — E1149 Type 2 diabetes mellitus with other diabetic neurological complication: Secondary | ICD-10-CM

## 2014-10-26 DIAGNOSIS — E1122 Type 2 diabetes mellitus with diabetic chronic kidney disease: Secondary | ICD-10-CM | POA: Diagnosis not present

## 2014-10-26 DIAGNOSIS — E785 Hyperlipidemia, unspecified: Secondary | ICD-10-CM

## 2014-10-26 LAB — GLUCOSE, CAPILLARY: GLUCOSE-CAPILLARY: 126 mg/dL — AB (ref 65–99)

## 2014-10-26 LAB — POCT GLYCOSYLATED HEMOGLOBIN (HGB A1C): Hemoglobin A1C: 8

## 2014-10-26 MED ORDER — QUINAPRIL HCL 20 MG PO TABS: 40.0000 mg | ORAL_TABLET | Freq: Every day | ORAL | Status: DC

## 2014-10-26 MED ORDER — ROSUVASTATIN CALCIUM 5 MG PO TABS: 10.0000 mg | ORAL_TABLET | Freq: Every day | ORAL | Status: DC

## 2014-10-26 MED ORDER — GLIPIZIDE 10 MG PO TABS: 10.0000 mg | ORAL_TABLET | Freq: Every day | ORAL | Status: DC

## 2014-10-26 NOTE — Patient Instructions (Signed)
It was a pleasure seeing you today, Kevin Soto.   - Increase your Quinapril to 40 mg daily - Increase your Glipizide to 10 mg daily - We will get blood work and urine sample today - I have sent a message to our clinic pharmacist to help with your Crestor. I would like for you to be on Crestor 10 mg daily. I will contact you once I know the new medication and dosage.  - follow up in 3 months  General Instructions:   Please bring your medicines with you each time you come to clinic.  Medicines may include prescription medications, over-the-counter medications, herbal remedies, eye drops, vitamins, or other pills.   Progress Toward Treatment Goals:  Treatment Goal 08/17/2014  Hemoglobin A1C deteriorated  Blood pressure at goal    Self Care Goals & Plans:  Self Care Goal 12/25/2013  Manage my medications take my medicines as prescribed; bring my medications to every visit; refill my medications on time  Eat healthy foods drink diet soda or water instead of juice or soda; eat more vegetables; eat foods that are low in salt; eat baked foods instead of fried foods; eat fruit for snacks and desserts    Home Blood Glucose Monitoring 12/25/2013  Check my blood sugar once a day  When to check my blood sugar N/A     Care Management & Community Referrals:  No flowsheet data found.

## 2014-10-27 ENCOUNTER — Telehealth: Payer: Self-pay | Admitting: Pharmacist

## 2014-10-27 DIAGNOSIS — E785 Hyperlipidemia, unspecified: Secondary | ICD-10-CM

## 2014-10-27 LAB — MICROALBUMIN / CREATININE URINE RATIO
Creatinine, Urine: 101.4 mg/dL
Microalb Creat Ratio: 268.2 mg/g — ABNORMAL HIGH (ref 0.0–30.0)
Microalb, Ur: 27.2 mg/dL — ABNORMAL HIGH (ref <2.0)

## 2014-10-27 MED ORDER — SIMVASTATIN 20 MG PO TABS: 20.0000 mg | ORAL_TABLET | Freq: Every day | ORAL | Status: DC

## 2014-10-27 NOTE — Assessment & Plan Note (Signed)
Lab Results  Component Value Date   HGBA1C 8.0 10/26/2014   HGBA1C 7.9 08/17/2014   HGBA1C 7.4 12/25/2013     Assessment: Diabetes control: fair control Progress toward A1C goal:  deteriorated Comments: HbA1c continues to increase steadily, now 8.0. Would like better glucose control with his CKD Stage III.  Plan: Medications: Increase Glipizide to 10 mg daily  Home glucose monitoring: Frequency: once a day Instruction/counseling given: reminded to bring blood glucose meter & log to each visit, reminded to bring medications to each visit and discussed diet Other plans:  - Recheck HbA1c in 3 months - Patient will need eye and foot exams in August - Microalbumin/Cr ratio today>> elevated at 268, this is more indicative of diabetic renal disease. Patient is already on an ACE.

## 2014-10-27 NOTE — Progress Notes (Signed)
   Subjective:    Patient ID: Kevin Soto, male    DOB: [birth year], 54 y.o.   MRN: [id number]  HPI Kevin Soto is a 55yo man with PMHx of HTN, dilated cardiomyopathy, CVA, CAD, Type 2 DM with peripheral neuropathy, and CKD Stage III who presents to the clinic for a routine visit.   Please refer to A&P documentation.    Review of Systems General: Denies fever, chills, night sweats, changes in weight, changes in appetite HEENT: Denies headaches, ear pain, changes in vision, rhinorrhea, sore throat CV: Denies CP, palpitations, SOB, orthopnea Pulm: Denies SOB, cough, wheezing GI: Denies abdominal pain, nausea, vomiting, diarrhea, melena, hematochezia GU: Denies dysuria, hematuria, frequency Msk: Denies muscle cramps, joint pains Neuro: Denies weakness, numbness, tingling Skin: Denies rashes, bruising    Objective:   Physical Exam General: sitting up in chair, pleasant, NAD HEENT: [place]/AT, EOMI, sclera anicteric, mucus membranes moist CV: RRR, no m/g/r Pulm: CTA bilaterally, breaths non-labored Abd: BS+, soft, non-tender, non-distended Ext: warm, no edema Neuro: alert and oriented x 3, no focal deficits      Assessment & Plan:  Please refer to A&P documentation.

## 2014-10-27 NOTE — Assessment & Plan Note (Signed)
Patient had some concerns about the cost of his Crestor. He states he is having to pay $45 a month for Crestor 5 mg daily. His last LDL was 85 in March 2016. Would like LDL lower than 70 with his hx of diabetes.  - Increase Crestor to 10 mg daily - Referral to Dr. Maudie Mercury (pharmacy) to help with alternative to Crestor>> per Dr. Maudie Mercury Atorvastatin 20 mg $is 45 for 3 month supply. Pravastatin is more expensive on his plan. Simvastatin 20 mg $4/month at [facility] ($5 one-time membership fee) or $3/month at [facility] ($10 one-time membership fee). Will need to discuss options with patient.

## 2014-10-27 NOTE — Assessment & Plan Note (Signed)
Cr stable at 1.7. At last visit I did not get to check a urine microalbumin, will check today as want to qualify his CKD more since he has not received work up for his kidney disease. - Microalbumin/Cr ratio today>> elevated at 268. More indicative of renal disease secondary to diabetes.  - Continue quinapril  - Continue to monitor kidney function - Referral to nephrology if kidney function starts to decline

## 2014-10-27 NOTE — Telephone Encounter (Signed)
Therapeutic interchange from rosuvastatin to simvastatin per PCP.  Unable to reach patient for notification/education---pharmacy will also try contacting patient and will mail out Rx $0 copay.

## 2014-10-27 NOTE — Assessment & Plan Note (Signed)
BP Readings from Last 3 Encounters:  10/26/14 148/82  08/17/14 135/74  04/30/14 150/82    Lab Results  Component Value Date   NA 136 08/17/2014   K 4.8 08/17/2014   CREATININE 1.73* 08/17/2014    Assessment: Blood pressure control: mildly elevated Progress toward BP goal:  unchanged Comments: BP mildly elevated. Patient states home systolic BPs in 0000000 range. Would like better BP control with his Stage III kidney disease.   Plan: Medications:  Increase Quinapril to 40 mg daily. Continue Coreg 25 mg BID, HCTZ 25 mg daily, and Spironolactone 25 mg daily.

## 2014-10-28 NOTE — Progress Notes (Signed)
Internal Medicine Clinic Attending  Case discussed with Dr. Rivet at the time of the visit.  We reviewed the resident's history and exam and pertinent patient test results.  I agree with the assessment, diagnosis, and plan of care documented in the resident's note.  

## 2015-02-27 DIAGNOSIS — Z23 Encounter for immunization: Secondary | ICD-10-CM | POA: Diagnosis not present

## 2015-03-07 DIAGNOSIS — E113522 Type 2 diabetes mellitus with proliferative diabetic retinopathy with traction retinal detachment involving the macula, left eye: Secondary | ICD-10-CM | POA: Diagnosis not present

## 2015-03-07 DIAGNOSIS — E113551 Type 2 diabetes mellitus with stable proliferative diabetic retinopathy, right eye: Secondary | ICD-10-CM | POA: Diagnosis not present

## 2015-03-07 LAB — HM DIABETES EYE EXAM

## 2015-03-18 ENCOUNTER — Encounter: Payer: Self-pay | Admitting: Internal Medicine

## 2015-04-05 ENCOUNTER — Encounter: Payer: Self-pay | Admitting: Internal Medicine

## 2015-04-05 ENCOUNTER — Ambulatory Visit (INDEPENDENT_AMBULATORY_CARE_PROVIDER_SITE_OTHER): Payer: Medicare Other | Admitting: Internal Medicine

## 2015-04-05 VITALS — BP 128/79 | HR 76 | Temp 98.2°F | Ht 72.0 in | Wt 236.0 lb

## 2015-04-05 DIAGNOSIS — I10 Essential (primary) hypertension: Secondary | ICD-10-CM

## 2015-04-05 DIAGNOSIS — I6310 Cerebral infarction due to embolism of unspecified precerebral artery: Secondary | ICD-10-CM | POA: Diagnosis not present

## 2015-04-05 DIAGNOSIS — E1159 Type 2 diabetes mellitus with other circulatory complications: Secondary | ICD-10-CM | POA: Diagnosis not present

## 2015-04-05 DIAGNOSIS — E1149 Type 2 diabetes mellitus with other diabetic neurological complication: Secondary | ICD-10-CM | POA: Diagnosis not present

## 2015-04-05 DIAGNOSIS — I25119 Atherosclerotic heart disease of native coronary artery with unspecified angina pectoris: Secondary | ICD-10-CM | POA: Diagnosis not present

## 2015-04-05 DIAGNOSIS — I152 Hypertension secondary to endocrine disorders: Secondary | ICD-10-CM

## 2015-04-05 LAB — GLUCOSE, CAPILLARY: GLUCOSE-CAPILLARY: 124 mg/dL — AB (ref 65–99)

## 2015-04-05 LAB — POCT GLYCOSYLATED HEMOGLOBIN (HGB A1C): HEMOGLOBIN A1C: 7

## 2015-04-05 NOTE — Patient Instructions (Signed)
You are doing great, Kevin Soto!  Your Hemoglobin A1c came down to 7.0. Your diabetes is doing excellent.   Try eating more fruits and vegetables to help with your constipation.   Follow up appointment in 3 months.  General Instructions:   Please bring your medicines with you each time you come to clinic.  Medicines may include prescription medications, over-the-counter medications, herbal remedies, eye drops, vitamins, or other pills.   Progress Toward Treatment Goals:  Treatment Goal 10/26/2014  Hemoglobin A1C deteriorated  Blood pressure unchanged    Self Care Goals & Plans:  Self Care Goal 04/05/2015  Manage my medications take my medicines as prescribed; bring my medications to every visit; refill my medications on time  Monitor my health keep track of my blood glucose; bring my glucose meter and log to each visit; keep track of my blood pressure; check my feet daily  Eat healthy foods eat more vegetables; eat foods that are low in salt; eat baked foods instead of fried foods  Be physically active find workout friends    Home Blood Glucose Monitoring 10/26/2014  Check my blood sugar once a day  When to check my blood sugar -     Care Management & Community Referrals:  No flowsheet data found.

## 2015-04-09 NOTE — Progress Notes (Signed)
   Subjective:    Patient ID: Kevin Soto, male    DOB: 1960/10/10, 54 y.o.   MRN: [id number]  HPI Kevin Soto is a 54yo man with PMHx of HTN, CAD, CVA, and type 2 DM who presents today for follow up of his diabetes.   Type 2 DM: Last HbA1c 8.0, today has improved to 7.0. His Glipizide was increased to 10 mg daily at his last visit and he reports being compliant with this. He reports being active, swimming at the gym pool several times a week. He did not bring his glucometer today, but reports testing his blood sugar 4 times a week and usually are in the 90-140 range. Denies any blurry vision, polyuria, or polydipsia.  HTN: BP controlled at 128/79. He takes Coreg 25 mg BID, HCTZ 25 mg daily, Quinapril 40 mg daily, and Spironolactone 25 mg daily.  CAD s/p PCI to RCA: He denies any chest pain, dyspnea, or palpitations. He reports taking aspirin, coreg, and simvastatin.   Hx CVA: Denies any changes in vision, weakness, tingling/numbness, difficulty swallowing, or dizziness. He takes aspirin, plavix, and simvastatin.   Review of Systems General: Denies fever, chills, night sweats, changes in weight, changes in appetite HEENT: Denies headaches, ear pain, changes in vision, rhinorrhea, sore throat CV: Denies CP, palpitations, SOB, orthopnea Pulm: Denies SOB, cough, wheezing GI: Denies abdominal pain, nausea, vomiting, diarrhea, constipation, melena, hematochezia GU: Denies dysuria, hematuria, frequency Msk: Denies muscle cramps, joint pains Neuro: Denies weakness, numbness, tingling Skin: Denies rashes, bruising Psych: Denies depression, anxiety, hallucinations    Objective:   Physical Exam General: alert, sitting up, pleasant, speech mildly dysarthric from prior stroke, NAD HEENT: [place]/AT, EOMI, sclera anicteric, mucus membranes moist CV: RRR, no m/g/r Pulm: CTA bilaterally, breaths non-labored Abd: BS+, soft, non-tender Ext: warm, no peripheral edema  Neuro: alert and oriented x 3.  Strength 5/5 in upper and lower extremities bilaterally.     Assessment & Plan:  Please refer to A&P documentation.

## 2015-04-09 NOTE — Assessment & Plan Note (Signed)
No new neurological symptoms. Continue ASA, plavix, and statin.

## 2015-04-09 NOTE — Assessment & Plan Note (Signed)
No anginal symptoms. Continue Coreg, ASA, and statin.

## 2015-04-09 NOTE — Assessment & Plan Note (Signed)
Lab Results  Component Value Date   HGBA1C 7.0 04/05/2015   HGBA1C 8.0 10/26/2014   HGBA1C 7.9 08/17/2014     Assessment: Diabetes control: good control (HgbA1C at goal) Progress toward A1C goal:  at goal Comments: Well controlled. Patient reports increased physical activity which seems to have improved his blood sugars significantly.   Plan: Medications:  Continue Glipizide 10 mg daily. Home glucose monitoring: Frequency: once a day Timing: before breakfast Instruction/counseling given: Reminded to bring glucometer and medications to every visit. Other plans:  - Follow up in 3 months - Check HbA1c at that time

## 2015-04-09 NOTE — Assessment & Plan Note (Signed)
BP Readings from Last 3 Encounters:  04/05/15 128/79  10/26/14 148/82  08/17/14 135/74    Lab Results  Component Value Date   NA 136 08/17/2014   K 4.8 08/17/2014   CREATININE 1.73* 08/17/2014    Assessment: Blood pressure control: controlled Progress toward BP goal:  at goal Comments: BP well controlled.  Plan: Medications:  Continue Coreg 25 mg BID, HCTZ 25 mg daily, Quinapril 40 mg daily, and Spironolactone 25 mg daily.

## 2015-04-12 NOTE — Progress Notes (Signed)
Case discussed with Dr. Rivet soon after the resident saw the patient.  We reviewed the resident's history and exam and pertinent patient test results.  I agree with the assessment, diagnosis and plan of care documented in the resident's note. 

## 2015-04-13 ENCOUNTER — Encounter: Payer: Self-pay | Admitting: *Deleted

## 2015-04-28 ENCOUNTER — Other Ambulatory Visit: Payer: Self-pay | Admitting: Internal Medicine

## 2015-05-02 ENCOUNTER — Ambulatory Visit (INDEPENDENT_AMBULATORY_CARE_PROVIDER_SITE_OTHER): Payer: Medicare Other | Admitting: Cardiology

## 2015-05-02 ENCOUNTER — Encounter: Payer: Self-pay | Admitting: Cardiology

## 2015-05-02 VITALS — BP 128/76 | HR 77 | Ht 72.0 in | Wt 233.0 lb

## 2015-05-02 DIAGNOSIS — I25119 Atherosclerotic heart disease of native coronary artery with unspecified angina pectoris: Secondary | ICD-10-CM

## 2015-05-02 DIAGNOSIS — I152 Hypertension secondary to endocrine disorders: Secondary | ICD-10-CM

## 2015-05-02 DIAGNOSIS — I10 Essential (primary) hypertension: Secondary | ICD-10-CM

## 2015-05-02 DIAGNOSIS — E1159 Type 2 diabetes mellitus with other circulatory complications: Secondary | ICD-10-CM

## 2015-05-02 LAB — BASIC METABOLIC PANEL
BUN: 33 mg/dL — AB (ref 7–25)
CHLORIDE: 104 mmol/L (ref 98–110)
CO2: 23 mmol/L (ref 20–31)
Calcium: 9.5 mg/dL (ref 8.6–10.3)
Creat: 1.81 mg/dL — ABNORMAL HIGH (ref 0.70–1.33)
GLUCOSE: 155 mg/dL — AB (ref 65–99)
POTASSIUM: 6.1 mmol/L — AB (ref 3.5–5.3)
Sodium: 135 mmol/L (ref 135–146)

## 2015-05-02 NOTE — Patient Instructions (Signed)
Your physician wants you to follow-up in: 1 Year. You will receive a reminder letter in the mail two months in advance. If you don't receive a letter, please call our office to schedule the follow-up appointment.  Your physician recommends that you return for lab work in: [employer] [place] and [place]!!

## 2015-05-02 NOTE — Progress Notes (Signed)
HPI The patient presents for follow up of CAD.  Since I last saw him he has done well. The patient denies any new symptoms such as chest discomfort, neck or arm discomfort. There has been no new shortness of breath, PND or orthopnea. There have been no reported palpitations, presyncope or syncope.  He does exercise at the senior center swimming and doing other activities almost daily. With this he denies any cardiovascular symptoms. He does have difficulty with his gait and speech.   Allergies  Allergen Reactions  . Clonidine Derivatives     Dizzy, too sleepy    Current Outpatient Prescriptions  Medication Sig Dispense Refill  . aspirin EC 81 MG tablet Take 1 tablet (81 mg total) by mouth daily.    . carvedilol (COREG) 25 MG tablet Take 1 tablet (25 mg total) by mouth 2 (two) times daily with a meal. 180 tablet 3  . clopidogrel (PLAVIX) 75 MG tablet Take 1 tablet (75 mg total) by mouth daily. 90 tablet 3  . glipiZIDE (GLUCOTROL) 10 MG tablet Take 1 tablet by mouth  daily before breakfast 90 tablet 1  . hydrochlorothiazide (HYDRODIURIL) 25 MG tablet Take 1 tablet (25 mg total) by mouth daily. 90 tablet 3  . nitroGLYCERIN (NITROSTAT) 0.4 MG SL tablet Place 1 tablet (0.4 mg total) under the tongue every 5 (five) minutes as needed. 100 tablet 1  . quinapril (ACCUPRIL) 20 MG tablet Take 2 tablets (40 mg total) by mouth daily. 90 tablet 3  . simvastatin (ZOCOR) 20 MG tablet Take 1 tablet (20 mg total) by mouth daily. 90 tablet 3  . spironolactone (ALDACTONE) 25 MG tablet Take 1 tablet (25 mg total) by mouth daily. 90 tablet 3   No current facility-administered medications for this visit.    Past Medical History  Diagnosis Date  . Diabetes mellitus   . Hypertension   . Macular hemorrhage   . Dyslipidemia   . Ischemic cardiomyopathy     echo 7/12: EF 40-45%, septal, apical, inf basal HK, mod LVH, mild MR, mild LAE  . Anemia   . CAD (coronary artery disease)     cath 8/12:  LM ok, mLAD  occluded with trivial collats from right AM and dRCA, mDx (small) 80%, RI 25%, tiny branch of OM 90%, mAM 30-40%, RCA sub-totally occluded, then 80%, EF 40%, inf HK.  He underwent PCI with [name] with placement of a Promus DES to the mRCA x 2  . History of stroke     evidence of CVA on MRI in past  . History of alcohol abuse   . Vision, loss, sudden 02/27/2011  . Gait instability 02/27/2011  . Sleep apnea   . Hyperlipidemia   . Stroke [facility]) 2012    uses cane to steady due to dizziness    Past Surgical History  Procedure Laterality Date  . Coronary angioplasty with stent placement  2012  . Refractive surgery  2012    right and left eye    ROS:  As stated in the HPI and negative for all other systems.  PHYSICAL EXAM BP 128/76 mmHg  Pulse 77  Ht 6' (1.829 m)  Wt 233 lb (105.688 kg)  BMI 31.59 kg/m2 GENERAL:  Well appearing NECK:  No jugular venous distention, waveform within normal limits, carotid upstroke brisk and symmetric, no bruits, no thyromegaly LUNGS:  Clear to auscultation bilaterally CHEST:  Unremarkable HEART:  PMI not displaced or sustained,S1 and S2 within normal limits, no S3,  no clicks, no rubs, no , positive S4 ABD:  Flat, positive bowel sounds normal in frequency in pitch, no bruits, no rebound, no guarding, no midline pulsatile mass, no hepatomegaly, no splenomegaly EXT:  2 plus pulses throughout, no edema, no cyanosis no clubbing   EKG:  Sinus rhythm, rate 77, axis within normal limits, intervals within normal limits, inferolateral T wave inversions unchanged from previous EKGs 05/02/2015  ASSESSMENT AND PLAN  CAD:  The patient has no new sypmtoms.  No further cardiovascular testing is indicated.  We will continue with aggressive risk reduction and meds as listed.  CARDIOMYOPATHY:  His EF was normal on echo late last year. No further workup is suggested.  HTN:  The blood pressure is at target. No change in medications is indicated. We will continue with  therapeutic lifestyle changes (TLC).  CKD:  I do note that his creat has been creeping up.  His BMET was last checked in March.  He is on a high risk med (spironolactone) so I will check a BMET.    CVA:  He remains on ASA and Plavix secondary to his CVA.

## 2015-05-03 ENCOUNTER — Telehealth: Payer: Self-pay | Admitting: Cardiology

## 2015-05-03 ENCOUNTER — Telehealth: Payer: Self-pay

## 2015-05-03 DIAGNOSIS — E875 Hyperkalemia: Secondary | ICD-10-CM

## 2015-05-03 LAB — POTASSIUM: POTASSIUM: 5 mmol/L (ref 3.5–5.3)

## 2015-05-03 NOTE — Telephone Encounter (Signed)
Received call from [place] with [facility] lab calling to report stat potassium of 5.0.Advised we will call back with lab results after Dr.Hochrein reviews.

## 2015-05-03 NOTE — Telephone Encounter (Signed)
Returning call about lab results. Please call   Thanks

## 2015-05-03 NOTE — Telephone Encounter (Signed)
Patient called no answer.Left message on personal voice mail call office about his lab work done 05/02/15.

## 2015-05-03 NOTE — Telephone Encounter (Signed)
Returned call to patient no answer.LMTC. 

## 2015-05-03 NOTE — Telephone Encounter (Signed)
Received call back from patient.Advised lab done 05/02/15 revealed elevated potassium.Spoke to DOD Dr.Berry he advised repeat stat potassium.Patient will have done a [facility] lab at 1:30 pm today.

## 2015-05-06 ENCOUNTER — Telehealth: Payer: Self-pay | Admitting: Cardiology

## 2015-05-06 NOTE — Telephone Encounter (Signed)
Spoke to pt. Advised results normal, he voiced understanding.

## 2015-05-06 NOTE — Telephone Encounter (Signed)
Follow Up ° ° ° ° °Pt is returning call from earlier. Please call. °

## 2015-05-26 DIAGNOSIS — E113511 Type 2 diabetes mellitus with proliferative diabetic retinopathy with macular edema, right eye: Secondary | ICD-10-CM | POA: Diagnosis not present

## 2015-05-26 DIAGNOSIS — H2513 Age-related nuclear cataract, bilateral: Secondary | ICD-10-CM | POA: Diagnosis not present

## 2015-05-26 DIAGNOSIS — H16223 Keratoconjunctivitis sicca, not specified as Sjogren's, bilateral: Secondary | ICD-10-CM | POA: Diagnosis not present

## 2015-05-26 DIAGNOSIS — E113522 Type 2 diabetes mellitus with proliferative diabetic retinopathy with traction retinal detachment involving the macula, left eye: Secondary | ICD-10-CM | POA: Diagnosis not present

## 2015-06-12 ENCOUNTER — Other Ambulatory Visit: Payer: Self-pay | Admitting: Internal Medicine

## 2015-06-13 ENCOUNTER — Other Ambulatory Visit: Payer: Self-pay | Admitting: Internal Medicine

## 2015-07-13 ENCOUNTER — Ambulatory Visit (INDEPENDENT_AMBULATORY_CARE_PROVIDER_SITE_OTHER): Payer: Medicare Other | Admitting: Internal Medicine

## 2015-07-13 ENCOUNTER — Encounter: Payer: Self-pay | Admitting: Internal Medicine

## 2015-07-13 VITALS — BP 143/72 | HR 67 | Temp 98.3°F | Ht 72.0 in | Wt 233.7 lb

## 2015-07-13 DIAGNOSIS — I152 Hypertension secondary to endocrine disorders: Secondary | ICD-10-CM | POA: Diagnosis not present

## 2015-07-13 DIAGNOSIS — E1159 Type 2 diabetes mellitus with other circulatory complications: Secondary | ICD-10-CM | POA: Diagnosis not present

## 2015-07-13 DIAGNOSIS — E1149 Type 2 diabetes mellitus with other diabetic neurological complication: Secondary | ICD-10-CM

## 2015-07-13 DIAGNOSIS — E1142 Type 2 diabetes mellitus with diabetic polyneuropathy: Secondary | ICD-10-CM | POA: Diagnosis not present

## 2015-07-13 DIAGNOSIS — Z0000 Encounter for general adult medical examination without abnormal findings: Secondary | ICD-10-CM

## 2015-07-13 DIAGNOSIS — I10 Essential (primary) hypertension: Secondary | ICD-10-CM

## 2015-07-13 DIAGNOSIS — Z7984 Long term (current) use of oral hypoglycemic drugs: Secondary | ICD-10-CM | POA: Diagnosis not present

## 2015-07-13 DIAGNOSIS — Z7982 Long term (current) use of aspirin: Secondary | ICD-10-CM

## 2015-07-13 LAB — GLUCOSE, CAPILLARY: Glucose-Capillary: 121 mg/dL — ABNORMAL HIGH (ref 65–99)

## 2015-07-13 LAB — POCT GLYCOSYLATED HEMOGLOBIN (HGB A1C): HEMOGLOBIN A1C: 7.3

## 2015-07-13 NOTE — Progress Notes (Signed)
Patient ID: Kevin Soto, male   DOB: 02/07/1961, 55 y.o.   MRN: [id number]    Subjective:   Patient ID: Kevin Soto male   DOB: May 20, 1961 55 y.o.   MRN: [id number]  HPI: [name] is a 55 y.o. very pleasant man with past medical history of non-insulin dependent Type 2 DM, hypertension, hyperlipidemia, CVA, CAD s/p DES, CKD Stage 3, and chronic normocytic anemia who presents for follow-up of diabetes.   His last A1c was 7.9 on 08/17/14. He reports compliance with taking glipizide 10 mg daily with no episodes of hypoglycemia. He checks his blood sugar once daily with range in 100's. He unfortunately did not bring his glucose meter today. He has chronic blurry vision and occasional peripheral neuropathy but denies polyuria, polydipsia, polyphagia, or foot injury/ulceration. He tries to follow a healthy diet and exercise. His weight has been stable. He has had recent eye exam that revealed retinopathy.   He reports compliance with taking coreg, HCTZ, quinapril, and spironolactone for hypertension. He denies headache, chest pain, LE edema, or lightheadedness,   He is compliant with taking simvastatin for hyperlipidemia with no myalgias or myositis.     Past Medical History  Diagnosis Date  . Diabetes mellitus   . Hypertension   . Macular hemorrhage   . Dyslipidemia   . Ischemic cardiomyopathy     echo 7/12: EF 40-45%, septal, apical, inf basal HK, mod LVH, mild MR, mild LAE.  EF 55% by echo 2015  . Anemia   . CAD (coronary artery disease)     cath 8/12:  LM ok, mLAD occluded with trivial collats from right AM and dRCA, mDx (small) 80%, RI 25%, tiny branch of OM 90%, mAM 30-40%, RCA sub-totally occluded, then 80%, EF 40%, inf HK.  He underwent PCI with [name] with placement of a Promus DES to the mRCA x 2  . History of stroke     evidence of CVA on MRI in past  . History of alcohol abuse   . Vision, loss, sudden 02/27/2011  . Gait instability 02/27/2011  . Sleep  apnea   . Hyperlipidemia   . Stroke [facility]) 2012    uses cane to steady due to dizziness   Current Outpatient Prescriptions  Medication Sig Dispense Refill  . aspirin EC 81 MG tablet Take 1 tablet (81 mg total) by mouth daily.    . carvedilol (COREG) 25 MG tablet Take 1 tablet by mouth  twice a day with meals 180 tablet 2  . clopidogrel (PLAVIX) 75 MG tablet Take 1 tablet by mouth  daily 90 tablet 2  . glipiZIDE (GLUCOTROL) 10 MG tablet Take 1 tablet by mouth  daily before breakfast 90 tablet 1  . hydrochlorothiazide (HYDRODIURIL) 25 MG tablet Take 1 tablet by mouth  daily 90 tablet 2  . nitroGLYCERIN (NITROSTAT) 0.4 MG SL tablet Place 1 tablet (0.4 mg total) under the tongue every 5 (five) minutes as needed. 100 tablet 1  . quinapril (ACCUPRIL) 20 MG tablet Take 2 tablets (40 mg total) by mouth daily. 90 tablet 3  . simvastatin (ZOCOR) 20 MG tablet Take 1 tablet (20 mg total) by mouth daily. 90 tablet 3  . spironolactone (ALDACTONE) 25 MG tablet Take 1 tablet by mouth  daily 90 tablet 2   No current facility-administered medications for this visit.   Family History  Problem Relation Age of Onset  . Adopted: Yes  . Heart attack Mother     Died  68  . Heart failure Mother   . Diabetes Mother   . Heart attack Father   . Heart failure Father   . Diabetes Father   . Heart disease Sister     s/p CABG x3 in her mid 5's  . Prostate cancer Father   . Crohn's disease Sister   . Crohn's disease Other     niece  . Kidney disease Brother    Social History   Social History  . Marital Status: Single    Spouse Name: N/A  . Number of Children: 0  . Years of Education: N/A   Occupational History  . disabled    Social History Main Topics  . Smoking status: Never Smoker   . Smokeless tobacco: Never Used  . Alcohol Use: No  . Drug Use: No  . Sexual Activity: Not Asked   Other Topics Concern  . None   Social History Narrative   Lives in [place] with his cousin   No children    Exercises 3 times a week   Review of Systems: Review of Systems  Constitutional: Negative for fever and chills.  Eyes: Positive for blurred vision (chronic).  Respiratory: Negative for cough, shortness of breath and wheezing.   Cardiovascular: Negative for chest pain and leg swelling.  Gastrointestinal: Negative for nausea, vomiting, abdominal pain, diarrhea and constipation.  Genitourinary: Negative for dysuria, urgency and frequency.  Musculoskeletal: Negative for myalgias.  Neurological: Positive for sensory change (occasional peripheral neuropathy). Negative for headaches.  Endo/Heme/Allergies: Negative for polydipsia.    Objective:  Physical Exam: Filed Vitals:   07/13/15 1432  BP: 143/72  Pulse: 67  Temp: 98.3 F (36.8 C)  TempSrc: Oral  Height: 6' (1.829 m)  Weight: 233 lb 11.2 oz (106.006 kg)  SpO2: 100%    Physical Exam  Constitutional: He is oriented to person, place, and time. He appears well-developed and well-nourished. No distress.  HENT:  Head: Normocephalic and atraumatic.  Right Ear: External ear normal.  Left Ear: External ear normal.  Nose: Nose normal.  Mouth/Throat: Oropharynx is clear and moist. No oropharyngeal exudate.  Eyes: Conjunctivae and EOM are normal. Pupils are equal, round, and reactive to light. Right eye exhibits no discharge. Left eye exhibits no discharge. No scleral icterus.  Neck: Normal range of motion. Neck supple.  Cardiovascular: Normal rate, regular rhythm and normal heart sounds.   Pulmonary/Chest: Effort normal and breath sounds normal. No respiratory distress. He has no rales.  Abdominal: Soft. Bowel sounds are normal. He exhibits no distension. There is no tenderness. There is no rebound and no guarding.  Musculoskeletal: Normal range of motion. He exhibits no edema or tenderness.  Neurological: He is alert and oriented to person, place, and time.  Skin: Skin is warm and dry. No rash noted. He is not diaphoretic. No erythema.  No pallor.  Psychiatric: He has a normal mood and affect. His behavior is normal. Judgment and thought content normal.    Assessment & Plan:   Please see problem list for problem-based assessment and plan

## 2015-07-13 NOTE — Assessment & Plan Note (Signed)
Assessment: Pt with well-controlled hypertension compliant with three-class (diuretic, BB, and ACEi) anti-hypertensive therapy who presents with blood pressure of 143/72.  Plan: -BP 143/72 near goal <140/90 -Continue coreg 25 mg BID, quinapril 40 mg daily, HCTZ 25 mg daily, and spironolactone 25 mg daily  -Obtain CMP at next visit (it was ordered but pt did not stop by lab)

## 2015-07-13 NOTE — Assessment & Plan Note (Signed)
Obtain screening HIV and HCV Ab at next visit (it was ordered but pt did not stop by lab)

## 2015-07-13 NOTE — Patient Instructions (Signed)
-  Your A1c has improved from 7.9 to 7.3, keep taking glipizide 10 mg daily.  -Keep taking your other medications  -Will check your bloodwork and call you with the results  -Pleasure meeting you, please come back in 3 months  General Instructions:   Please bring your medicines with you each time you come to clinic.  Medicines may include prescription medications, over-the-counter medications, herbal remedies, eye drops, vitamins, or other pills.   Progress Toward Treatment Goals:  Treatment Goal 04/05/2015  Hemoglobin A1C at goal  Blood pressure at goal    Self Care Goals & Plans:  Self Care Goal 07/13/2015  Manage my medications take my medicines as prescribed; bring my medications to every visit; refill my medications on time  Monitor my health bring my glucose meter and log to each visit; keep track of my blood glucose  Eat healthy foods eat more vegetables; eat foods that are low in salt; eat baked foods instead of fried foods; drink diet soda or water instead of juice or soda  Be physically active find workout friends    Home Blood Glucose Monitoring 04/05/2015  Check my blood sugar once a day  When to check my blood sugar before breakfast     Care Management & Community Referrals:  No flowsheet data found.

## 2015-07-13 NOTE — Assessment & Plan Note (Addendum)
Assessment: Pt with last A1c 7.9 on 08/17/14 compliant with oral hypoglycemic therapy who presents with CBG of 121 and improved A1c of 7.3.   Plan: -A1c 7.3 near goal <7, continue glipizide 10 mg daily  -BP 143/72 near goal <140/90, continue coreg 25 mg BID, quinapril 40 mg daily, HCTZ 25 mg daily, and spironolactone 25 mg daily  -LDL 85 at goal <100, continue simvastatin 20 mg daily  -Last annual eye exam on 03/07/15 with retinopathy  -Last annual foot exam on 04/05/15 -Last annual urine microalbumin on 10/26/14 with 268.2 mg of proteinuria, continue quinapril 40 mg daily  -BMI 31.68 not at goal <25, encourage weight loss  -Continue aspirin 81 mg daily for secondary CVD prevention

## 2015-07-14 NOTE — Progress Notes (Signed)
Internal Medicine Clinic Attending  Case discussed with Dr. Rabbani soon after the resident saw the patient.  We reviewed the resident's history and exam and pertinent patient test results.  I agree with the assessment, diagnosis, and plan of care documented in the resident's note.  

## 2015-11-02 ENCOUNTER — Other Ambulatory Visit: Payer: Self-pay | Admitting: Internal Medicine

## 2015-11-07 DIAGNOSIS — E113522 Type 2 diabetes mellitus with proliferative diabetic retinopathy with traction retinal detachment involving the macula, left eye: Secondary | ICD-10-CM | POA: Diagnosis not present

## 2015-11-07 DIAGNOSIS — E113591 Type 2 diabetes mellitus with proliferative diabetic retinopathy without macular edema, right eye: Secondary | ICD-10-CM | POA: Diagnosis not present

## 2015-11-07 DIAGNOSIS — T85398D Other mechanical complication of other ocular prosthetic devices, implants and grafts, subsequent encounter: Secondary | ICD-10-CM | POA: Diagnosis not present

## 2015-11-07 DIAGNOSIS — H2522 Age-related cataract, morgagnian type, left eye: Secondary | ICD-10-CM | POA: Diagnosis not present

## 2016-01-12 MED FILL — glipiZIDE 10 MG TABS: 10 | 5 days supply | Qty: 5 | Fill #0

## 2016-01-12 MED FILL — SIMVASTATIN 20 MG TABLET: 20 | 5 days supply | Qty: 5 | Fill #0

## 2016-01-31 ENCOUNTER — Ambulatory Visit (INDEPENDENT_AMBULATORY_CARE_PROVIDER_SITE_OTHER): Payer: Medicare Other | Admitting: Internal Medicine

## 2016-01-31 ENCOUNTER — Encounter: Payer: Self-pay | Admitting: Internal Medicine

## 2016-01-31 ENCOUNTER — Other Ambulatory Visit: Payer: Self-pay | Admitting: Internal Medicine

## 2016-01-31 VITALS — BP 122/80 | HR 69 | Temp 97.8°F | Resp 20 | Ht 70.0 in | Wt 232.6 lb

## 2016-01-31 DIAGNOSIS — E1149 Type 2 diabetes mellitus with other diabetic neurological complication: Secondary | ICD-10-CM

## 2016-01-31 DIAGNOSIS — L03011 Cellulitis of right finger: Secondary | ICD-10-CM

## 2016-01-31 DIAGNOSIS — I69322 Dysarthria following cerebral infarction: Secondary | ICD-10-CM

## 2016-01-31 DIAGNOSIS — Z7984 Long term (current) use of oral hypoglycemic drugs: Secondary | ICD-10-CM

## 2016-01-31 DIAGNOSIS — Z0000 Encounter for general adult medical examination without abnormal findings: Secondary | ICD-10-CM

## 2016-01-31 DIAGNOSIS — M79644 Pain in right finger(s): Secondary | ICD-10-CM | POA: Diagnosis not present

## 2016-01-31 DIAGNOSIS — I152 Hypertension secondary to endocrine disorders: Secondary | ICD-10-CM

## 2016-01-31 DIAGNOSIS — Z7982 Long term (current) use of aspirin: Secondary | ICD-10-CM

## 2016-01-31 DIAGNOSIS — L03019 Cellulitis of unspecified finger: Secondary | ICD-10-CM | POA: Insufficient documentation

## 2016-01-31 DIAGNOSIS — Z23 Encounter for immunization: Secondary | ICD-10-CM

## 2016-01-31 DIAGNOSIS — I10 Essential (primary) hypertension: Secondary | ICD-10-CM

## 2016-01-31 DIAGNOSIS — Z7189 Other specified counseling: Secondary | ICD-10-CM | POA: Insufficient documentation

## 2016-01-31 DIAGNOSIS — Z79899 Other long term (current) drug therapy: Secondary | ICD-10-CM

## 2016-01-31 DIAGNOSIS — I6310 Cerebral infarction due to embolism of unspecified precerebral artery: Secondary | ICD-10-CM

## 2016-01-31 DIAGNOSIS — I25119 Atherosclerotic heart disease of native coronary artery with unspecified angina pectoris: Secondary | ICD-10-CM

## 2016-01-31 DIAGNOSIS — E1159 Type 2 diabetes mellitus with other circulatory complications: Secondary | ICD-10-CM | POA: Diagnosis not present

## 2016-01-31 DIAGNOSIS — Z955 Presence of coronary angioplasty implant and graft: Secondary | ICD-10-CM

## 2016-01-31 LAB — GLUCOSE, CAPILLARY: Glucose-Capillary: 215 mg/dL — ABNORMAL HIGH (ref 65–99)

## 2016-01-31 LAB — POCT GLYCOSYLATED HEMOGLOBIN (HGB A1C): Hemoglobin A1C: 8.3

## 2016-01-31 NOTE — Assessment & Plan Note (Signed)
Stable. Continue ASA, Plavix, and statin.

## 2016-01-31 NOTE — Assessment & Plan Note (Signed)
His presentation is most consistent with paronychia of the thumbnail. Advised warm soaks and applying triple antibiotic topical (Neosporin or Mupirocin) 2-3 times daily. Recommended to return to clinic if does not resolve in next few weeks.

## 2016-01-31 NOTE — Assessment & Plan Note (Signed)
BP well-controlled -Continue current regimen 

## 2016-01-31 NOTE — Patient Instructions (Addendum)
General Instructions: - Use topical triple antibiotic like Neosporoin or Mupirocin on your finger 2-3 times daily. Soak your finger in warm water prior to applying antibiotic ointment.  - Work on eating more "brown" carbohydrates instead of "white" ones. This means eating more whole grain and whole wheat products. - Avoid fatty foods such as butter, fried foods, desserts - Increase your activity- work on getting back to swimming and playing tennis - Will refer you to [name] for nutrition education. You will be contacted for an appointment. - Fill out advanced care planning forms and bring to next visit.  - Follow up in 3 months to address diabetes and advanced care planning  Please bring your medicines with you each time you come to clinic.  Medicines may include prescription medications, over-the-counter medications, herbal remedies, eye drops, vitamins, or other pills.   Progress Toward Treatment Goals:  Treatment Goal 04/05/2015  Hemoglobin A1C at goal  Blood pressure at goal    Self Care Goals & Plans:  Self Care Goal 01/31/2016  Manage my medications take my medicines as prescribed; bring my medications to every visit; refill my medications on time  Monitor my health keep track of my blood pressure; keep track of my weight  Eat healthy foods eat baked foods instead of fried foods; eat foods that are low in salt; drink diet soda or water instead of juice or soda  Be physically active find workout friends    Home Blood Glucose Monitoring 04/05/2015  Check my blood sugar once a day  When to check my blood sugar before breakfast     Care Management & Community Referrals:  No flowsheet data found.

## 2016-01-31 NOTE — Assessment & Plan Note (Signed)
Stable. Continue Coreg, ASA, Plavix, and statin.

## 2016-01-31 NOTE — Assessment & Plan Note (Signed)
He was given the advanced care planning forms today. We had a good discussion about what advanced directives are in general. I asked him to think about his wishes and the kind of care he would like to receive if he were unable to make decisions for himself. He agreed to do this and will bring his forms to his next visit so we can discuss further.

## 2016-01-31 NOTE — Assessment & Plan Note (Signed)
-   Flu shot today - Prevnar 13 today

## 2016-01-31 NOTE — Assessment & Plan Note (Signed)
His A1c has increased from 7.3 to 8.3. Likely this is related to dietary indiscretion. We discussed eating more complex carbohydrates (whole wheat, whole grain) when he does eat carbs and incorporating more vegetables. Advised to avoid sweets and sugared beverages. He was interested in meeting with [name] for further nutrition education, referral placed. Will continue Glipizide 10 mg daily for now. May need to increase to 10 mg BID if A1c does not improve at next visit after dietary changes. Follow up in 3 months.

## 2016-01-31 NOTE — Progress Notes (Signed)
   CC: Right thumb pain  HPI:  [name] is a 55 y.o. man with PMHx as noted below who presents today with right thumb pain.  Right Thumb Pain: Reports pain and swelling of his right thumb started about 1 week ago. He describes a throbbing, sharp pain that is primarily on the medial side of the nailbed of the thumb. States this happened to him once about 2 years ago and the pain/swelling resolved on its own. Denies any trauma to the thumb. Denies any pain that extends up his thumb, the rest of his hand, or wrist. Denies any drainage from the area.  HTN: BP 122/80 today. He is taking Coreg 25 mg BID, HCTZ 25 mg daily, Quinapril 40 mg daily, and Spironolactone 25 mg daily.   Type 2 DM: Last A1c 7.3. Today his A1c is 8.3. He is taking Glipizide 10 mg daily. Reports eating more carbohydrates than usual. He is also doing less physical activity compared to his last visit.   CAD: s/p DES to [facility] in 2012. He is on ASA, Plavix, Coreg, and Zocor. Denies any chest pain, shortness of breath, or palpitations.   Hx CVA: Remote infarcts noted on MRI in 2012. He is on ASA, Plavix, and statin. He has some residual dysarthria from prior stroke. He denies any new onset weakness, numbness/tingling, or changes in speech.   Advanced Care Planning: Patient given forms today. He would like to look them over and discuss this topic more at his next visit.    Past Medical History:  Diagnosis Date  . Anemia   . CAD (coronary artery disease)    cath 8/12:  LM ok, mLAD occluded with trivial collats from right AM and dRCA, mDx (small) 80%, RI 25%, tiny branch of OM 90%, mAM 30-40%, RCA sub-totally occluded, then 80%, EF 40%, inf HK.  He underwent PCI with [name] with placement of a Promus DES to the mRCA x 2  . Diabetes mellitus   . Dyslipidemia   . Gait instability 02/27/2011  . History of alcohol abuse   . History of stroke    evidence of CVA on MRI in past  . Hyperlipidemia   . Hypertension   .  Ischemic cardiomyopathy    echo 7/12: EF 40-45%, septal, apical, inf basal HK, mod LVH, mild MR, mild LAE.  EF 55% by echo 2015  . Macular hemorrhage   . Sleep apnea   . Stroke [facility]) 2012   uses cane to steady due to dizziness  . Vision, loss, sudden 02/27/2011    Review of Systems:  All negative except per HPI  Physical Exam:  Vitals:   01/31/16 1356  BP: 122/80  Pulse: 69  Resp: 20  Temp: 97.8 F (36.6 C)  TempSrc: Oral  SpO2: 100%  Weight: 232 lb 9.6 oz (105.5 kg)  Height: 5\' 10"  (1.778 m)   General: well-nourished, well-appearing man with mild dysarthria that has been present since his last stroke, pleasant HEENT: [place]/AT, EOMI, sclera anicteric, mucus membranes moist CV: RRR, no m/g/r Pulm: CTA bilaterally, breaths non-labored Abd: BS+, soft, non-tender Ext: warm, no edema. Right thumb has mild swelling and redness on the medial aspect of the thumbnail. Area is tender to palpation. No areas of fluctuance palpated. Neuro: alert and oriented x 3, no focal deficits  Assessment & Plan:   See Encounters Tab for problem based charting.  Patient discussed with Dr. Eppie Gibson

## 2016-02-01 LAB — BMP8+ANION GAP
ANION GAP: 15 mmol/L (ref 10.0–18.0)
BUN/Creatinine Ratio: 19 (ref 9–20)
BUN: 37 mg/dL — AB (ref 6–24)
CO2: 23 mmol/L (ref 18–29)
CREATININE: 1.9 mg/dL — AB (ref 0.76–1.27)
Calcium: 9.2 mg/dL (ref 8.7–10.2)
Chloride: 100 mmol/L (ref 96–106)
GFR calc Af Amer: 44 mL/min/{1.73_m2} — ABNORMAL LOW (ref >59)
GFR calc non Af Amer: 38 mL/min/{1.73_m2} — ABNORMAL LOW (ref >59)
Glucose: 193 mg/dL — ABNORMAL HIGH (ref 65–99)
Potassium: 4.9 mmol/L (ref 3.5–5.2)
SODIUM: 138 mmol/L (ref 134–144)

## 2016-02-02 NOTE — Progress Notes (Signed)
Case discussed with Dr. Arcelia Jew soon after the resident saw the patient. We reviewed the resident's history and exam and pertinent patient test results. I agree with the assessment, diagnosis, and plan of care documented in the resident's note.

## 2016-02-06 ENCOUNTER — Ambulatory Visit (INDEPENDENT_AMBULATORY_CARE_PROVIDER_SITE_OTHER): Payer: Medicare Other | Admitting: Internal Medicine

## 2016-02-06 VITALS — BP 134/63 | HR 80 | Temp 98.6°F | Ht 72.0 in

## 2016-02-06 DIAGNOSIS — B9689 Other specified bacterial agents as the cause of diseases classified elsewhere: Secondary | ICD-10-CM

## 2016-02-06 DIAGNOSIS — L02511 Cutaneous abscess of right hand: Secondary | ICD-10-CM

## 2016-02-06 DIAGNOSIS — L03011 Cellulitis of right finger: Secondary | ICD-10-CM

## 2016-02-06 MED ORDER — AMOXICILLIN-POT CLAVULANATE 875-125 MG PO TABS: 1.0000 | ORAL_TABLET | Freq: Two times a day (BID) | ORAL | 0 refills | Status: AC

## 2016-02-06 MED ORDER — AMOXICILLIN-POT CLAVULANATE 875-125 MG PO TABS: 1.0000 | ORAL_TABLET | Freq: Two times a day (BID) | ORAL | 0 refills | Status: DC

## 2016-02-06 NOTE — Patient Instructions (Signed)
Thank you for coming to see me today. It was a pleasure. Today we talked about:   Keep the bandage on your finger for another day to let this infection drain on its own.  Please take the antibiotic for 7 days.  Please follow-up with [place] in 12 weeks if not better  If you have any questions or concerns, please do not hesitate to call the office at [phone].  Take Care,   Jule Ser, DO

## 2016-02-06 NOTE — Progress Notes (Signed)
   CC: here for swelling of right thumb  HPI:  [name] is a 55 y.o. man with a past medical history listed below here today for follow up of his right thumb swelling.  Seen last week by his PCP for this.  Presentation consistent with paronychia of the thumb.  Patient advised to do warm soaks and apply Neosporin or Mupirocin 2-3 times daily.  He returns today because despite following this, his swelling of this area has worsened.  He reports prior history of this that resolved on its own.  The fluid collection is not draining, there was no trauma, and is not painful unless pressure is applied to it.  It is more of a nagging discomfort.  He has had no fever or chills, nausea or vomiting.  He reports to biting his fingernails.    Past Medical History:  Diagnosis Date  . Anemia   . CAD (coronary artery disease)    cath 8/12:  LM ok, mLAD occluded with trivial collats from right AM and dRCA, mDx (small) 80%, RI 25%, tiny branch of OM 90%, mAM 30-40%, RCA sub-totally occluded, then 80%, EF 40%, inf HK.  He underwent PCI with [name] with placement of a Promus DES to the mRCA x 2  . Diabetes mellitus   . Dyslipidemia   . Gait instability 02/27/2011  . History of alcohol abuse   . History of stroke    evidence of CVA on MRI in past  . Hyperlipidemia   . Hypertension   . Ischemic cardiomyopathy    echo 7/12: EF 40-45%, septal, apical, inf basal HK, mod LVH, mild MR, mild LAE.  EF 55% by echo 2015  . Macular hemorrhage   . Sleep apnea   . Stroke [facility]) 2012   uses cane to steady due to dizziness  . Vision, loss, sudden 02/27/2011    Review of Systems:   Please see pertinent ROS reviewed in HPI and problem based charting.   Physical Exam:  Vitals:   02/06/16 1528  BP: 134/63  Pulse: 80  Temp: 98.6 F (37 C)  TempSrc: Oral  SpO2: 100%  Height: 6' (1.829 m)   Physical Exam  Constitutional: He is well-developed, well-nourished, and in no distress.  HENT:  Head:  Normocephalic and atraumatic.  Musculoskeletal:  Right thumb with small abscess located just superior to the nail bed.  It is fluctuant with no drainage.  There is minimal surrounding redness to the area.       Assessment & Plan:   See Encounters Tab for problem based charting.  Patient seen with Dr. Daryll Drown.  Paronychia of thumb A: Unsure if this is truly paronychia or if it is a small abscess not involving the nailbed.  Either way, there appears to be an abscess that is amenable to I&D.  P: - I&D performed successfully with drainage of purulent material that was malodorous.  Patient with no history of MRSA infection, but Augmentin was chosen due to exposure of the area to oral flora.  We will do Augmentin BID for 7 days.  Incision and Drainage Procedure Note:  The affected area was cleaned in a sterile fashion. A 7-blade scalpel was used to incise the wound. Purulent fluid was drained from the area. A sterile gauze wrapping was applied to the area. The patient tolerated the procedure well. No complications were encountered.   Dr. Daryll Drown was present.

## 2016-02-07 NOTE — Assessment & Plan Note (Signed)
A: Unsure if this is truly paronychia or if it is a small abscess not involving the nailbed.  Either way, there appears to be an abscess that is amenable to I&D.  P: - I&D performed successfully with drainage of purulent material that was malodorous.  Patient with no history of MRSA infection, but Augmentin was chosen due to exposure of the area to oral flora.  We will do Augmentin BID for 7 days.  Incision and Drainage Procedure Note:  The affected area was cleaned in a sterile fashion. A 7-blade scalpel was used to incise the wound. Purulent fluid was drained from the area. A sterile gauze wrapping was applied to the area. The patient tolerated the procedure well. No complications were encountered.   Dr. Daryll Drown was present.

## 2016-02-08 ENCOUNTER — Encounter: Payer: Self-pay | Admitting: *Deleted

## 2016-02-14 NOTE — Progress Notes (Signed)
Internal Medicine Clinic Attending  I saw and evaluated the patient.  I personally confirmed the key portions of the history and exam documented by Dr. Juleen China and I reviewed pertinent patient test results.  The assessment, diagnosis, and plan were formulated together and I agree with the documentation in the resident's note.  I was present for entire procedure.

## 2016-02-21 ENCOUNTER — Telehealth: Payer: Self-pay | Admitting: Internal Medicine

## 2016-02-21 NOTE — Telephone Encounter (Signed)
APT. REMINDER CALL, LMTCB °

## 2016-02-22 ENCOUNTER — Ambulatory Visit (INDEPENDENT_AMBULATORY_CARE_PROVIDER_SITE_OTHER): Payer: Medicare Other | Admitting: Dietician

## 2016-02-22 DIAGNOSIS — E1149 Type 2 diabetes mellitus with other diabetic neurological complication: Secondary | ICD-10-CM

## 2016-02-22 DIAGNOSIS — Z713 Dietary counseling and surveillance: Secondary | ICD-10-CM | POA: Diagnosis not present

## 2016-02-22 NOTE — Progress Notes (Signed)
  Medical Nutrition Therapy:  Appt start time: 1696 end time:  1612. Visit # 1  Assessment:  Primary concerns today: increase in A1C.  Kevin Soto is here with his fiance. They will be married next month.  She is knowledgeable about foods and is trying to support him in his food choices. They eat out 4-5 times a week. He currently  lives with his sister and fixes some of his food and sister also fixes some. He has low vision, but can read with glasses. He was working out almost daily until a few months ago.  He denies symptoms of hypoglycemia despite not eating snacks. His reported intake is reasonable.  I suspect his fat/calorie intake has increased with the increase in eating out and intake of fried foods.     Preferred Learning Style: Auditory he has low vision Learning Readiness: Contemplating  ANTHROPOMETRICS: weight-230.5#, height-6', BMI-31 WEIGHT HISTORY:lowest as an adult- 198# highest 230s . His weight has increased ~ 30 # since last visit.  SLEEP:"good" sleeps 10- 7-8 AM wakes 2x MEDICATIONS: uses pill box, he didn't know what they were for, takes 325 mg iron sulfate daily BLOOD SUGAR: A1C recently increased 1%,  His niece was checking his blood sugar, but she has not been able to do so for the past month or more.   DIETARY INTAKE: Usual eating pattern includes 1 meals and 1 snacks per day..   24-hr recall:  B ( 8 AM): bowl ( ~ 1.5 cups) spec. K and 2% milk and coffee x2 with splenda and half and half or sausage and egg with toast, or milk and banana L ( 1 PM): sometimes out like at [employer] or [employer] or home BLT, salad, Iced tea with splenda/fried chicken, spinach and mushrooms D ( PM): hot dog x1 slaw, chili onion, mustard, 6 tater tots, cup of tea with splenda or out  At a fish restaurant that serves many fried and starchy foods. l Snk ( PM): apple, chips, ice cream Beverages: coffee, tea, milk and water  Usual physical activity: stopped going to [employer] [facility]  Estimated  energy needs: 1800-2000 calories 200-240 g carbohydrates 70 g protein 70 g fat  Progress Towards Goal(s):  In progress.   Nutritional Diagnosis:  NI-5.6.2 Excessive fat intake As related to food choices high in fat/fried foods.  As evidenced by his food recall.    Intervention:  Nutrition education about carb consistency, a healthy weight for him, healthy portions of fats. Signed him up for silver sneakers.  Coordination of care: recommend weight lossof 10-30 #, Consider alternative medicine that does not cause weight gain/low blood sugar if possible  Teaching Method Utilized: Visual.Auditory, Hands on  Handouts given during visit include:carb counting and meal planning for diabetes  Barriers to learning/adherence to lifestyle change: competing values Demonstrated degree of understanding via:  Teach Back   Monitoring/Evaluation:  Dietary intake, exercise, and body weight in 2 month(s).

## 2016-02-22 NOTE — Patient Instructions (Addendum)
Eating about the same amount of carbs at meals and snacks should help your blood sugars.   About 75 grams of  Starches and sugars (carb)  at each meal and 15-30 grams carbohydrates (starches and sugars) for snacks may work for you   Veggies, fats  and meats do not count- add them as needed.( 2-3 cups veggies a day and 1-2 cups fruit/day is recommended)   Schedule a visit with the doctor and [name] for after December 12th.

## 2016-02-24 ENCOUNTER — Ambulatory Visit: Payer: Medicare Other

## 2016-03-13 ENCOUNTER — Other Ambulatory Visit: Payer: Self-pay | Admitting: Internal Medicine

## 2016-03-13 DIAGNOSIS — N529 Male erectile dysfunction, unspecified: Secondary | ICD-10-CM

## 2016-03-13 MED ORDER — SILDENAFIL CITRATE 50 MG PO TABS: 50.0000 mg | ORAL_TABLET | Freq: Every day | ORAL | 0 refills | Status: DC | PRN

## 2016-03-14 ENCOUNTER — Other Ambulatory Visit: Payer: Self-pay

## 2016-04-27 ENCOUNTER — Other Ambulatory Visit: Payer: Self-pay | Admitting: *Deleted

## 2016-04-27 MED ORDER — GLIPIZIDE 10 MG PO TABS: ORAL_TABLET | ORAL | 1 refills | Status: DC

## 2016-04-27 MED ORDER — QUINAPRIL HCL 40 MG PO TABS: 40.0000 mg | ORAL_TABLET | Freq: Every day | ORAL | 1 refills | Status: DC

## 2016-05-07 ENCOUNTER — Telehealth: Payer: Self-pay | Admitting: Internal Medicine

## 2016-05-07 NOTE — Telephone Encounter (Signed)
APT. REMINDER CALL, LMTCB °

## 2016-05-07 NOTE — Progress Notes (Signed)
CC: Diabetes follow up  HPI:  [name] is a 55 y.o. man with PMHx as noted below who presents today for follow up of his diabetes.  Type 2 DM: Last A1c 8.3. His A1c had increased last visit likely due to dietary indiscretion. Today his A1c is 8.2. Since that visit he has met with [name] for diabetes and nutrition education. He reports losing 10 lbs and has been eating out at restaurants less. He is taking Glipizide 10 mg daily. His meter battery died so unable to download blood sugars today. He reports AM blood sugars mostly in the 130-140 range and evening blood sugars in the 160's. Denies any hypoglycemia.   Hyperlipidemia: Last lipid profile shows Chol 146, Trigly 86, HDL 44, and LDL 85. He is taking Simvastatin 20 mg daily. Denies any muscle cramps.  CKD Stage 3: Cr has slowly been trending up over the last 2-3 years. His Cr was 1.81 last Dec and prior to that had been in the 1.5-1.7 range. Denies any difficulty urinating.  Normocytic Anemia: He had extensive work up for his anemia in the past. Last colonoscopy in Feb 2014 was normal. He had a low normal Vitamin B12 level in 2012 with normal methylmalonic acid level. This was repeated in 2014 and was normal. He then had protein electrophoresis in 2014 which revealed slightly elevated kappa and lambda light chains. He saw Dr. Alen Blew with [facility] and his anemia was thought to be multifactorial and most likely anemia of chronic disease. He does have a hx of renal insufficiency. EPO noted to be inappropriately normal at 5.6. Dr. Alen Blew had recommended monitoring closely and considering bone marrow biopsy if the patient develops other cytopenias. Last CBC in 2016 shows a Hgb 11.2 without any other cytopenias. Denies any melena, hematochezia, or hematuria.  Advanced Care Planning: Patient had received the advanced care planning forms last visit. He and his wife brought them today and we reviewed them together. He does wish to be a full  code at this time. However, he notes he does not want to be on a "breathing machine" for an extended period of time. He wishes for his wife to be his HCPOA and she is agreeable to this role.   Erectile Dysfunction: He had requested Viagra over the phone a few months ago. I instructed him that I would like to see him in the office to discuss further and prescribed him 4 tablets. He reports Viagra "did not do anything." He states he has been unable to obtain an erection. This is very frustrating for him as him and his wife were just married a few months ago. He notes difficulty getting erections since his stroke a few years ago.    Past Medical History:  Diagnosis Date  . Anemia   . CAD (coronary artery disease)    cath 8/12:  LM ok, mLAD occluded with trivial collats from right AM and dRCA, mDx (small) 80%, RI 25%, tiny branch of OM 90%, mAM 30-40%, RCA sub-totally occluded, then 80%, EF 40%, inf HK.  He underwent PCI with [name] with placement of a Promus DES to the mRCA x 2  . Diabetes mellitus   . Dyslipidemia   . Gait instability 02/27/2011  . History of alcohol abuse   . History of stroke    evidence of CVA on MRI in past  . Hyperlipidemia   . Hypertension   . Ischemic cardiomyopathy    echo 7/12: EF 40-45%, septal, apical,  inf basal HK, mod LVH, mild MR, mild LAE.  EF 55% by echo 2015  . Macular hemorrhage   . Sleep apnea   . Stroke [facility]) 2012   uses cane to steady due to dizziness  . Vision, loss, sudden 02/27/2011    Review of Systems:  All negative except per HPI  Physical Exam:  Vitals:   05/08/16 1340  BP: 110/65  Pulse: 72  Temp: 98.7 F (37.1 C)  TempSrc: Oral  SpO2: 100%  Weight: 221 lb 12.8 oz (100.6 kg)  Height: 6' (1.829 m)   General: alert, sitting up, NAD HEENT: EOMI, sclera anicteric, mucus membranes moist CV: RRR, no m/g/r Pulm: CTA bilaterally, breaths non-labored Abd: BS+, soft, non-tender Ext: warm, no peripheral edema, distal pulses 2+,  toenails with onychomycosis, no open sores on feet Neuro: alert and oriented x 3  Assessment & Plan:   See Encounters Tab for problem based charting.  Patient discussed with Dr. Dareen Piano

## 2016-05-08 ENCOUNTER — Ambulatory Visit (INDEPENDENT_AMBULATORY_CARE_PROVIDER_SITE_OTHER): Payer: Medicare Other | Admitting: Dietician

## 2016-05-08 ENCOUNTER — Encounter (INDEPENDENT_AMBULATORY_CARE_PROVIDER_SITE_OTHER): Payer: Self-pay

## 2016-05-08 ENCOUNTER — Encounter: Payer: Self-pay | Admitting: Internal Medicine

## 2016-05-08 ENCOUNTER — Ambulatory Visit (INDEPENDENT_AMBULATORY_CARE_PROVIDER_SITE_OTHER): Payer: Medicare Other | Admitting: Internal Medicine

## 2016-05-08 VITALS — BP 110/65 | HR 72 | Temp 98.7°F | Ht 72.0 in | Wt 221.8 lb

## 2016-05-08 DIAGNOSIS — E1149 Type 2 diabetes mellitus with other diabetic neurological complication: Secondary | ICD-10-CM

## 2016-05-08 DIAGNOSIS — B351 Tinea unguium: Secondary | ICD-10-CM

## 2016-05-08 DIAGNOSIS — Z6830 Body mass index (BMI) 30.0-30.9, adult: Secondary | ICD-10-CM

## 2016-05-08 DIAGNOSIS — Z7189 Other specified counseling: Secondary | ICD-10-CM

## 2016-05-08 DIAGNOSIS — E1169 Type 2 diabetes mellitus with other specified complication: Secondary | ICD-10-CM

## 2016-05-08 DIAGNOSIS — E784 Other hyperlipidemia: Secondary | ICD-10-CM

## 2016-05-08 DIAGNOSIS — E1165 Type 2 diabetes mellitus with hyperglycemia: Secondary | ICD-10-CM

## 2016-05-08 DIAGNOSIS — Z7984 Long term (current) use of oral hypoglycemic drugs: Secondary | ICD-10-CM

## 2016-05-08 DIAGNOSIS — Z713 Dietary counseling and surveillance: Secondary | ICD-10-CM

## 2016-05-08 DIAGNOSIS — Z79899 Other long term (current) drug therapy: Secondary | ICD-10-CM

## 2016-05-08 DIAGNOSIS — N529 Male erectile dysfunction, unspecified: Secondary | ICD-10-CM | POA: Insufficient documentation

## 2016-05-08 DIAGNOSIS — I2510 Atherosclerotic heart disease of native coronary artery without angina pectoris: Secondary | ICD-10-CM

## 2016-05-08 DIAGNOSIS — E785 Hyperlipidemia, unspecified: Secondary | ICD-10-CM

## 2016-05-08 DIAGNOSIS — E1122 Type 2 diabetes mellitus with diabetic chronic kidney disease: Secondary | ICD-10-CM

## 2016-05-08 DIAGNOSIS — N183 Chronic kidney disease, stage 3 (moderate): Secondary | ICD-10-CM

## 2016-05-08 DIAGNOSIS — D649 Anemia, unspecified: Secondary | ICD-10-CM

## 2016-05-08 DIAGNOSIS — Z8673 Personal history of transient ischemic attack (TIA), and cerebral infarction without residual deficits: Secondary | ICD-10-CM

## 2016-05-08 LAB — GLUCOSE, CAPILLARY: GLUCOSE-CAPILLARY: 196 mg/dL — AB (ref 65–99)

## 2016-05-08 LAB — POCT GLYCOSYLATED HEMOGLOBIN (HGB A1C): HEMOGLOBIN A1C: 8.2

## 2016-05-08 MED ORDER — GLUCOSE BLOOD VI STRP: ORAL_STRIP | 12 refills | Status: DC

## 2016-05-08 MED ORDER — TADALAFIL 10 MG PO TABS: 10.0000 mg | ORAL_TABLET | Freq: Every day | ORAL | 0 refills | Status: DC | PRN

## 2016-05-08 MED ORDER — GLIPIZIDE 10 MG PO TABS: 10.0000 mg | ORAL_TABLET | Freq: Two times a day (BID) | ORAL | 1 refills | Status: DC

## 2016-05-08 NOTE — Assessment & Plan Note (Signed)
We filled out the paperwork together along with his wife present. They will plan to get the advanced care directive forms notarized and bring back to me. He has expressed that he would like CPR, intubation and mechanical ventilation, fluids, and antibiotics if he were to become ill and could not speak for himself. He has elected his wife to be his HCPOA.

## 2016-05-08 NOTE — Assessment & Plan Note (Signed)
His A1c is stable despite losing 9 lbs. I congratulated him on losing weight and encouraged him to continue his healthier lifestyle changes. I was unable to review his glucometer readings as his meter battery had died so unclear how his blood sugars are doing on a day to day basis. I reordered some testing supplies for him. Will increase his Glipizide to 10 mg twice daily. Can consider adding on a GLP-1 or SGLT2 agent if continues to have insulin resistance. I reminded him about his eye exam and he states he did get one already this year. He will find out name of his ophthalmologist so we can get report. Will place referral for podiatry given his onychomycosis. Additionally he has poor vision so will need to pay attention to his feet closely at visits. Follow up in 3 months.

## 2016-05-08 NOTE — Progress Notes (Signed)
  Medical Nutrition Therapy:  Appt start time: 1512 end time:  1630. Visit # 2  Assessment:  Primary concerns today: increase in A1C.  Kevin Soto is here with his wife. They eat out 1-2 times a week now. Kevin Soto has lost 8# in the past 2 months.. He is working out using silver sneakers one time a week.  He denies symptoms of hypoglycemia. His reported intake is reasonable.  I suspect his fat/calorie intake has decreased with the decrease in eating out and intake of fried foods.  Note history of CKD  Preferred Learning Style: Auditory he has low vision Learning Readiness: Changing  ANTHROPOMETRICS: weight-221.8#, height-6', BMI-30 IBW- 178 +/-18# SLEEP:"good" sleeps 10- 7-8 AM wakes 2x MEDICATIONS: glipizide increased to 10 mg twice a day BLOOD SUGAR: A1C 8.2% today, only dropping 0.1% despite weight loss. He has not been checking blood sugars.    DIETARY INTAKE: Usual eating pattern includes 1 meals and 1 snacks per day..   24-hr recall: not done today, patient able to state the basic principles of healthy diet, what he would buy at the grocery store and how it should be prepared. his wife is assisting with food prep and portions.  Estimated energy needs: 1800-2000 calories 200-240 g carbohydrates 70 g protein 70 g fat  Progress Towards Goal(s):  In progress.   Nutritional Diagnosis:  NI-5.6.2 Excessive fat intake As related to food choices high in fat/fried foods.improving  As evidenced by his food recall and weight loss.    Intervention:  Nutrition education about carb consistency, portion sizes, healthier carb choices   Coordination of care: needs testing supplies; recommend weight loss of 10-20 #, Consider alternative medicine that does not cause weight gain/low blood sugar if possible  Teaching Method Utilized: Visual.Auditory, Hands on Handouts given during visit include:carb counting and meal planning for diabetes. New accu chek guide meter Barriers to learning/adherence to  lifestyle change: competing values Demonstrated degree of understanding via:  Teach Back   Monitoring/Evaluation:  Dietary intake, exercise, and body weight in 3 month(s)  [name], [name], RD 05/08/2016 5:02 PM. .

## 2016-05-08 NOTE — Patient Instructions (Signed)
General Instructions: - Increase Glipizide to 10 mg twice daily (before breakfast and before dinner) - Get new battery for your blood glucose meter - Please find out name of eye doctor so we can fax and get report for your last eye exam - Get advanced care directive forms notarized - Prescription for Cialis sent to pharmacy (4 tablets) - Come in tomorrow morning (8AM) for testosterone level - Blood work today  Thank you for bringing your medicines today. This helps [place] keep you safe from mistakes.   Progress Toward Treatment Goals:  Treatment Goal 04/05/2015  Hemoglobin A1C at goal  Blood pressure at goal    Self Care Goals & Plans:  Self Care Goal 05/08/2016  Manage my medications take my medicines as prescribed; refill my medications on time; bring my medications to every visit  Monitor my health keep track of my blood glucose; bring my glucose meter and log to each visit; keep track of my blood pressure; check my feet daily  Eat healthy foods eat more vegetables; eat foods that are low in salt; eat baked foods instead of fried foods  Be physically active find an activity I enjoy    Home Blood Glucose Monitoring 04/05/2015  Check my blood sugar once a day  When to check my blood sugar before breakfast     Care Management & Community Referrals:  No flowsheet data found.

## 2016-05-08 NOTE — Assessment & Plan Note (Signed)
Stable. Check CBC today to assess hemoglobin and other blood counts.

## 2016-05-08 NOTE — Assessment & Plan Note (Signed)
Cr has progressively been trending up. Will check bmet today.

## 2016-05-08 NOTE — Assessment & Plan Note (Signed)
Stable. Check lipid panel today. Consider switching to Atorvastatin or Rosuvastatin given his hx of CVA, DM, and CAD.

## 2016-05-08 NOTE — Assessment & Plan Note (Signed)
His erectile dysfunction is most likely related to his diabetes. It's possible that his hx of stroke could be contributing but I think diabetes is more likely, especially since his A1c has been trending up. We discussed the importance of blood sugar control and how this can help ED. He has agreed to work closely with me to improve his diabetes control and in the meantime will have him try Cialis 10 mg daily PRN. Will also check a testosterone level.

## 2016-05-09 ENCOUNTER — Other Ambulatory Visit: Payer: Self-pay | Admitting: Dietician

## 2016-05-09 ENCOUNTER — Other Ambulatory Visit (INDEPENDENT_AMBULATORY_CARE_PROVIDER_SITE_OTHER): Payer: Medicare Other

## 2016-05-09 DIAGNOSIS — T85398D Other mechanical complication of other ocular prosthetic devices, implants and grafts, subsequent encounter: Secondary | ICD-10-CM | POA: Diagnosis not present

## 2016-05-09 DIAGNOSIS — N529 Male erectile dysfunction, unspecified: Secondary | ICD-10-CM

## 2016-05-09 DIAGNOSIS — E1149 Type 2 diabetes mellitus with other diabetic neurological complication: Secondary | ICD-10-CM

## 2016-05-09 DIAGNOSIS — E113591 Type 2 diabetes mellitus with proliferative diabetic retinopathy without macular edema, right eye: Secondary | ICD-10-CM | POA: Diagnosis not present

## 2016-05-09 DIAGNOSIS — E113522 Type 2 diabetes mellitus with proliferative diabetic retinopathy with traction retinal detachment involving the macula, left eye: Secondary | ICD-10-CM | POA: Diagnosis not present

## 2016-05-09 DIAGNOSIS — H2522 Age-related cataract, morgagnian type, left eye: Secondary | ICD-10-CM | POA: Diagnosis not present

## 2016-05-09 LAB — CBC WITH DIFFERENTIAL/PLATELET
Basophils Absolute: 0 10*3/uL (ref 0.0–0.2)
Basos: 0 %
EOS (ABSOLUTE): 0.3 10*3/uL (ref 0.0–0.4)
EOS: 4 %
HEMATOCRIT: 34.3 % — AB (ref 37.5–51.0)
HEMOGLOBIN: 10.9 g/dL — AB (ref 13.0–17.7)
Immature Grans (Abs): 0 10*3/uL (ref 0.0–0.1)
Immature Granulocytes: 0 %
LYMPHS ABS: 2.1 10*3/uL (ref 0.7–3.1)
Lymphs: 23 %
MCH: 28.2 pg (ref 26.6–33.0)
MCHC: 31.8 g/dL (ref 31.5–35.7)
MCV: 89 fL (ref 79–97)
MONOCYTES: 8 %
Monocytes Absolute: 0.7 10*3/uL (ref 0.1–0.9)
Neutrophils Absolute: 5.9 10*3/uL (ref 1.4–7.0)
Neutrophils: 65 %
Platelets: 322 10*3/uL (ref 150–379)
RBC: 3.86 x10E6/uL — AB (ref 4.14–5.80)
RDW: 13.9 % (ref 12.3–15.4)
WBC: 9.1 10*3/uL (ref 3.4–10.8)

## 2016-05-09 LAB — BMP8+ANION GAP
ANION GAP: 15 mmol/L (ref 10.0–18.0)
BUN/Creatinine Ratio: 12 (ref 9–20)
BUN: 29 mg/dL — ABNORMAL HIGH (ref 6–24)
CALCIUM: 9.4 mg/dL (ref 8.7–10.2)
CHLORIDE: 97 mmol/L (ref 96–106)
CO2: 23 mmol/L (ref 18–29)
Creatinine, Ser: 2.44 mg/dL — ABNORMAL HIGH (ref 0.76–1.27)
GFR calc non Af Amer: 28 mL/min/{1.73_m2} — ABNORMAL LOW (ref >59)
GFR, EST AFRICAN AMERICAN: 33 mL/min/{1.73_m2} — AB (ref >59)
GLUCOSE: 193 mg/dL — AB (ref 65–99)
POTASSIUM: 5.2 mmol/L (ref 3.5–5.2)
Sodium: 135 mmol/L (ref 134–144)

## 2016-05-09 LAB — LIPID PANEL
Chol/HDL Ratio: 3.8 ratio units (ref 0.0–5.0)
Cholesterol, Total: 149 mg/dL (ref 100–199)
HDL: 39 mg/dL — AB (ref >39)
LDL Calculated: 92 mg/dL (ref 0–99)
TRIGLYCERIDES: 89 mg/dL (ref 0–149)
VLDL Cholesterol Cal: 18 mg/dL (ref 5–40)

## 2016-05-09 MED ORDER — ACCU-CHEK FASTCLIX LANCETS MISC: 12 refills | Status: DC

## 2016-05-09 MED ORDER — ACCU-CHEK GUIDE W/DEVICE KIT: 1.0000 | PACK | Freq: Two times a day (BID) | 0 refills | Status: DC

## 2016-05-09 NOTE — Telephone Encounter (Signed)
Patient and wife received a sample Accu chek guide meter at yesterday's visit. He needs testing supplies for checking his blood sugar two time a day. They requested that the prescription be sent to [facility].

## 2016-05-09 NOTE — Progress Notes (Signed)
Internal Medicine Clinic Attending  Case discussed with Dr. Rivet at the time of the visit.  We reviewed the resident's history and exam and pertinent patient test results.  I agree with the assessment, diagnosis, and plan of care documented in the resident's note.  

## 2016-05-10 ENCOUNTER — Encounter: Payer: Self-pay | Admitting: Internal Medicine

## 2016-05-10 ENCOUNTER — Telehealth: Payer: Self-pay | Admitting: Dietician

## 2016-05-10 LAB — TESTOSTERONE: TESTOSTERONE: 488 ng/dL (ref 264–916)

## 2016-05-10 NOTE — Telephone Encounter (Signed)
Patients wife called because his blood sugar are higher than the target and want to know what he can do to lower them.  Lowest 227 highest 248, coffee and grapes-  [phone] Returned her call and she would prefer to talk tomorrow,

## 2016-05-11 NOTE — Telephone Encounter (Signed)
Patient's wife left another message today that Kevin Soto;s fasting sugar was 153 today and his blood sugar after eating 1/2 cup honey nut cheerios and milk was 229. Her concern was that both values are above the ADA targets that were provided in their diabetes education.  Their call was returned; message left advising him to walk for 30 minutes every day to improve insulin sensitivity and decrease his carbs as well as eat carbs that do not raise blood sugar as much (list was provided at training) as well as give the increased dose of  glipizide two weeks to see it's full effect.  Encouraged them to continue checkng and asking questions.

## 2016-05-23 ENCOUNTER — Telehealth: Payer: Self-pay | Admitting: Dietician

## 2016-05-24 NOTE — Telephone Encounter (Signed)
Have tried to reach patient per request of his wife. Unable to reach him at listed phone number or leave a message.

## 2016-05-31 ENCOUNTER — Encounter: Payer: Self-pay | Admitting: Podiatry

## 2016-05-31 ENCOUNTER — Ambulatory Visit (INDEPENDENT_AMBULATORY_CARE_PROVIDER_SITE_OTHER): Payer: Medicare Other | Admitting: Podiatry

## 2016-05-31 VITALS — BP 96/63 | HR 74 | Resp 18 | Ht 72.0 in | Wt 220.0 lb

## 2016-05-31 DIAGNOSIS — E119 Type 2 diabetes mellitus without complications: Secondary | ICD-10-CM

## 2016-05-31 DIAGNOSIS — M79676 Pain in unspecified toe(s): Secondary | ICD-10-CM

## 2016-05-31 DIAGNOSIS — B351 Tinea unguium: Secondary | ICD-10-CM | POA: Diagnosis not present

## 2016-05-31 NOTE — Progress Notes (Signed)
   Subjective:    Patient ID: Kevin Soto, male    DOB: 1960/08/20, 56 y.o.   MRN: [id number]  HPI this patient presents to the office with chief complaint of long thick painful nails. Nails are painful when he walks and wears his shoes. He says he went to a salon and they did not work on his nails properly his doctor recommended he make an appointment with this office for an evaluation and treatment of his long painful nails. Patient is diabetic with neurologic manifestations as well as kidney disease    Review of Systems  Constitutional: Positive for unexpected weight change.  HENT: Positive for sinus pain and sneezing.   Genitourinary: Positive for frequency.       Objective:   Physical Exam GENERAL APPEARANCE: Alert, conversant. Appropriately groomed. No acute distress.  VASCULAR: Pedal pulses are  palpable at  [facility] and PT bilateral.  Capillary refill time is immediate to all digits,  Normal temperature gradient.   NEUROLOGIC: sensation is normal to 5.07 monofilament at 5/5 sites bilateral.  Light touch is intact bilateral, Muscle strength normal.  MUSCULOSKELETAL: acceptable muscle strength, tone and stability bilateral.  Intrinsic muscluature intact bilateral.  Rectus appearance of foot and digits noted bilateral.  Mild HAV deformity both feet.  DERMATOLOGIC: skin color, texture, and turgor are within normal limits.  No preulcerative lesions or ulcers  are seen, no interdigital maceration noted.  No open lesions present.  No drainage noted.  NAILS  Thick disfigured discolored nails both feet.         Assessment & Plan:  Onychomycosis  B/L  Diabetes with no foot pathology.   IE  Debridement of Nails.  RTC 3 months.   Gardiner Barefoot DPM

## 2016-06-14 ENCOUNTER — Other Ambulatory Visit: Payer: Self-pay | Admitting: Internal Medicine

## 2016-07-09 NOTE — Progress Notes (Signed)
CC: Diabetes follow up  HPI:  [name] is a 56 y.o. man with PMHx as noted below who presents today for follow up of his diabetes.  Type 2 DM: Last A1c 8.2. We had increased his Glipizide to 10 mg BID at his last visit and he reports compliance with this change. He reports he has been eating better at home and not going out to eat as much as before. His wife reports he has lost more weight, but his appetite remains unchanged. His weight today is 213 lbs and he was 220 lbs at his last visit in December. He did not bring his glucometer today.   HTN: BP 114/65 today. He is taking Coreg 25 mg BID, HCTZ 25 mg daily, Quinapril 40 mg daily, and Spironolactone 25 mg daily.   Low back pain: Reports for the last 5 days he has had a "nagging" low back pain. He reports having this pain before, actually 3 times over the last 10 years. He reports typically the pain will last 1 week and then resolve on its own. He describes the pain as 6/10 in severity, constant, worse with sitting down, and non-radiating. He has difficulty describing the quality of the pain other than "nagging."  He has not tried any OTC medications. He denies any trauma to his back or being involved in prior car accidents. He denies any loss of bladder/bowel control, numbness/tingling, or weakness.    Hyperlipidemia: Currently on Simvastatin 20 mg daily and tolerating well. We discussed today about switching to a higher intensity statin with his hx of CAD, CVA, and DM and he is agreeable.   CKD Stage 3: Baseline Cr previously in 1.7-1.9 range. However, his Cr last visit had increased to 2.4. He denies any NSAID use. Denies any difficulty urinating.   Erectile Dysfunction: Reports he is still having trouble obtaining an erection. He did not try Cialis as this was too expensive. His wife reports they are going to try an all-natural medication that was recommended by a friend. His testosterone level was checked last visit and was  normal at 488.   Past Medical History:  Diagnosis Date  . Anemia   . CAD (coronary artery disease)    cath 8/12:  LM ok, mLAD occluded with trivial collats from right AM and dRCA, mDx (small) 80%, RI 25%, tiny branch of OM 90%, mAM 30-40%, RCA sub-totally occluded, then 80%, EF 40%, inf HK.  He underwent PCI with [name] with placement of a Promus DES to the mRCA x 2  . Diabetes mellitus   . Dyslipidemia   . Gait instability 02/27/2011  . History of alcohol abuse   . History of stroke    evidence of CVA on MRI in past  . Hyperlipidemia   . Hypertension   . Ischemic cardiomyopathy    echo 7/12: EF 40-45%, septal, apical, inf basal HK, mod LVH, mild MR, mild LAE.  EF 55% by echo 2015  . Macular hemorrhage   . Sleep apnea   . Stroke [facility]) 2012   uses cane to steady due to dizziness  . Vision, loss, sudden 02/27/2011    Review of Systems:   General: Denies fever, chills, night sweats HEENT: Denies headaches, ear pain, changes in vision, rhinorrhea, sore throat CV: Denies CP, palpitations, SOB, orthopnea Pulm: Denies SOB, cough, wheezing GI: Denies abdominal pain, nausea, vomiting, diarrhea, constipation, melena, hematochezia GU: Denies dysuria, hematuria, frequency Msk: See HPI Neuro: See HPI Skin: Denies rashes,  bruising Psych: Denies depression, anxiety, hallucinations  Physical Exam:  Vitals:   07/10/16 1408  BP: 114/65  Pulse: 76  Temp: 98.2 F (36.8 C)  TempSrc: Oral  SpO2: 100%  Weight: 213 lb 12.8 oz (97 kg)  Height: 6' (1.829 m)   General: well-nourished man sitting up, NAD HEENT: [place]/AT, EOMI, sclera anicteric, mucus membranes moist CV: RRR, no m/g/r Pulm: CTA bilaterally, breaths non-labored  Back: No tenderness to palpation of spinous processes or surrounding back muscles  Ext: no peripheral edema. Full ROM in all extremities. Neuro: alert and oriented x 3. Strength 5/5 in upper and lower extremities bilaterally, although pain is elicited in lower back  when testing the lower extremities. Sensation symmetric and  intact to light touch. Positive bilateral straight leg test. Gait slower and more cautious than previously.   Assessment & Plan:   See Encounters Tab for problem based charting.  Patient discussed with Dr. Eppie Gibson

## 2016-07-10 ENCOUNTER — Ambulatory Visit (HOSPITAL_COMMUNITY)
Admission: RE | Admit: 2016-07-10 | Discharge: 2016-07-10 | Disposition: A | Discharge status: Home / Self Care | Payer: Medicare Other | Source: Ambulatory Visit | Attending: Student in an Organized Health Care Education/Training Program | Admitting: Student in an Organized Health Care Education/Training Program

## 2016-07-10 ENCOUNTER — Encounter (INDEPENDENT_AMBULATORY_CARE_PROVIDER_SITE_OTHER): Payer: Self-pay

## 2016-07-10 ENCOUNTER — Ambulatory Visit (INDEPENDENT_AMBULATORY_CARE_PROVIDER_SITE_OTHER): Payer: Medicare Other | Admitting: Internal Medicine

## 2016-07-10 ENCOUNTER — Encounter: Payer: Self-pay | Admitting: Internal Medicine

## 2016-07-10 VITALS — BP 114/65 | HR 76 | Temp 98.2°F | Ht 72.0 in | Wt 213.8 lb

## 2016-07-10 DIAGNOSIS — G8929 Other chronic pain: Secondary | ICD-10-CM | POA: Diagnosis not present

## 2016-07-10 DIAGNOSIS — E1159 Type 2 diabetes mellitus with other circulatory complications: Secondary | ICD-10-CM

## 2016-07-10 DIAGNOSIS — M899 Disorder of bone, unspecified: Secondary | ICD-10-CM | POA: Insufficient documentation

## 2016-07-10 DIAGNOSIS — M545 Low back pain, unspecified: Secondary | ICD-10-CM

## 2016-07-10 DIAGNOSIS — Z7984 Long term (current) use of oral hypoglycemic drugs: Secondary | ICD-10-CM

## 2016-07-10 DIAGNOSIS — I152 Hypertension secondary to endocrine disorders: Secondary | ICD-10-CM | POA: Diagnosis not present

## 2016-07-10 DIAGNOSIS — N529 Male erectile dysfunction, unspecified: Secondary | ICD-10-CM | POA: Diagnosis not present

## 2016-07-10 DIAGNOSIS — Z8673 Personal history of transient ischemic attack (TIA), and cerebral infarction without residual deficits: Secondary | ICD-10-CM

## 2016-07-10 DIAGNOSIS — E1122 Type 2 diabetes mellitus with diabetic chronic kidney disease: Secondary | ICD-10-CM | POA: Diagnosis not present

## 2016-07-10 DIAGNOSIS — E784 Other hyperlipidemia: Secondary | ICD-10-CM | POA: Diagnosis not present

## 2016-07-10 DIAGNOSIS — N183 Chronic kidney disease, stage 3 (moderate): Secondary | ICD-10-CM

## 2016-07-10 DIAGNOSIS — R634 Abnormal weight loss: Secondary | ICD-10-CM

## 2016-07-10 DIAGNOSIS — E1149 Type 2 diabetes mellitus with other diabetic neurological complication: Secondary | ICD-10-CM | POA: Diagnosis not present

## 2016-07-10 DIAGNOSIS — R937 Abnormal findings on diagnostic imaging of other parts of musculoskeletal system: Secondary | ICD-10-CM

## 2016-07-10 DIAGNOSIS — Z79899 Other long term (current) drug therapy: Secondary | ICD-10-CM

## 2016-07-10 DIAGNOSIS — E785 Hyperlipidemia, unspecified: Secondary | ICD-10-CM

## 2016-07-10 DIAGNOSIS — I10 Essential (primary) hypertension: Secondary | ICD-10-CM

## 2016-07-10 DIAGNOSIS — E1169 Type 2 diabetes mellitus with other specified complication: Secondary | ICD-10-CM | POA: Diagnosis not present

## 2016-07-10 DIAGNOSIS — I2510 Atherosclerotic heart disease of native coronary artery without angina pectoris: Secondary | ICD-10-CM

## 2016-07-10 LAB — GLUCOSE, CAPILLARY: GLUCOSE-CAPILLARY: 150 mg/dL — AB (ref 65–99)

## 2016-07-10 MED ORDER — DICLOFENAC SODIUM 1 % TD GEL: 4.0000 g | Freq: Four times a day (QID) | TRANSDERMAL | 2 refills | Status: DC

## 2016-07-10 MED ORDER — ATORVASTATIN CALCIUM 40 MG PO TABS: 40.0000 mg | ORAL_TABLET | Freq: Every day | ORAL | 5 refills | Status: DC

## 2016-07-10 NOTE — Patient Instructions (Addendum)
General Instructions: - Please bring in blood glucose meter to next visit - We will get lumbar x-ray today of back  - Blood work today - Can take Tylenol 500 mg every 6-8 hours as needed. Do not exceed 3,000 mg daily. - Try voltaren gel for back, apply quarter size amount 4 times daily as needed - Can also try heating pad  - Do not take advil, ibuprofen, goody powder, etc as this can worsen kidney function - Follow up in 2 months  Please bring your medicines with you each time you come to clinic.  Medicines may include prescription medications, over-the-counter medications, herbal remedies, eye drops, vitamins, or other pills.   Progress Toward Treatment Goals:  Treatment Goal 04/05/2015  Hemoglobin A1C at goal  Blood pressure at goal    Self Care Goals & Plans:  Self Care Goal 07/10/2016  Manage my medications take my medicines as prescribed; refill my medications on time; bring my medications to every visit  Monitor my health bring my glucose meter and log to each visit; keep track of my blood glucose; keep track of my blood pressure  Eat healthy foods eat foods that are low in salt; eat baked foods instead of fried foods; eat more vegetables  Be physically active find an activity I enjoy    Home Blood Glucose Monitoring 04/05/2015  Check my blood sugar once a day  When to check my blood sugar before breakfast     Care Management & Community Referrals:  No flowsheet data found.

## 2016-07-11 ENCOUNTER — Telehealth: Payer: Self-pay | Admitting: Internal Medicine

## 2016-07-11 DIAGNOSIS — M545 Low back pain, unspecified: Secondary | ICD-10-CM | POA: Insufficient documentation

## 2016-07-11 LAB — BMP8+ANION GAP
Anion Gap: 15 (ref 10.0–18.0)
BUN/Creatinine Ratio: 14 (ref 9–20)
BUN: 34 mg/dL — ABNORMAL HIGH (ref 6–24)
CO2: 21 mmol/L (ref 18–29)
CREATININE: 2.42 mg/dL — AB (ref 0.76–1.27)
Calcium: 9.7 mg/dL (ref 8.7–10.2)
Chloride: 101 mmol/L (ref 96–106)
GFR, EST AFRICAN AMERICAN: 33 — AB (ref >59)
GFR, EST NON AFRICAN AMERICAN: 28 — AB (ref >59)
Glucose: 126 mg/dL — ABNORMAL HIGH (ref 65–99)
POTASSIUM: 5.3 mmol/L — AB (ref 3.5–5.2)
SODIUM: 137 mmol/L (ref 134–144)

## 2016-07-11 NOTE — Assessment & Plan Note (Signed)
BP well controlled. Continue Coreg 25 mg BID, HCTZ 25 mg daily, Quinapril 40 mg daily, and Spironolactone 25 mg daily.

## 2016-07-11 NOTE — Assessment & Plan Note (Signed)
Will repeat a bmet today to reassess his Cr. >> Cr 2.4, stable from December. His BUN is also elevated which could represent volume depletion. I recommended he stay hydrated. Will monitor closely and work on better diabetes control.

## 2016-07-11 NOTE — Assessment & Plan Note (Signed)
He reports a 10 year hx of infrequent back pain (only 3 flare ups in last 10 years) that typically resolves within 1 week. No symptoms to suggest sciatic pain. He has never had imaging of his back before so will obtain a lumbar x-ray. Advised to use Tylenol and voltaren gel as needed (no NSAIDs with his CKD stage 3).   Addendum: Lumbar x-ray concerning for sclerotic and lytic lesions at L4 that could represent osteoarthritis vs primary bone lesions vs metastatic. MRI lumbar spine recommended. I have discussed these results with the patient and have ordered a lumbar spine MRI. I am concerned with his weight loss (17 lbs in last 4 months) that this could represent a malignancy. Will follow MRI results closely.

## 2016-07-11 NOTE — Telephone Encounter (Signed)
1st attempt patient picked up phone but connection was poor. 2nd attempt- Left message for patient that I would like to review test results with him. Advised to call clinic and I can call him back.

## 2016-07-11 NOTE — Assessment & Plan Note (Signed)
He did not bring in his glucometer today so unable to assess his blood sugar control after his Glipizide was increased. I have asked him to bring his glucometer to his next visit which he has agreed to do. His weight loss is likely helping his glucose control, however I am concerned at the degree of weight loss he has had over the last several months. Initially I thought it was due to his healthier eating habits but he has lost 17 lbs since October and now with his low back pain I am concerned for potential malignancy (see separate documentation). He will follow up with me in 2 months.

## 2016-07-11 NOTE — Assessment & Plan Note (Signed)
Likely secondary to his DM. I have asked the patient and his wife to let me know what the name of the all-natural medication they plan on using is once they get it. Can also consider checking a TSH next visit since it has been several years since this was checked (normal in 2012).

## 2016-07-11 NOTE — Assessment & Plan Note (Signed)
Switched to Atorvastatin 40 mg daily.

## 2016-07-12 NOTE — Progress Notes (Signed)
Case discussed with Dr. Arcelia Jew at the time of the visit. We reviewed the resident's history and exam and pertinent patient test results. I agree with the assessment, diagnosis, and plan of care documented in the resident's note.  Given the lytic lesion seen on the lumbar spine film and the worsening renal function would consider obtaining a CMP at the last visit to see if there is a protein-albumin gap.

## 2016-07-26 ENCOUNTER — Ambulatory Visit (HOSPITAL_COMMUNITY): Admission: RE | Admit: 2016-07-26 | Payer: Medicare Other | Source: Ambulatory Visit

## 2016-07-26 ENCOUNTER — Telehealth: Payer: Self-pay | Admitting: *Deleted

## 2016-07-26 NOTE — Telephone Encounter (Signed)
Received call from pt and male caller-was rx'd atorvastatin at last visit, but they couldn't remember which medication it was replacing.  Per pt's medical record-simvastatin was changed to atorvastatin.  Pt informed to discard simvastatin and agreed.  Phone call complete.Despina Hidden Cassady3/8/20185:01 PM

## 2016-07-27 ENCOUNTER — Other Ambulatory Visit: Payer: Self-pay | Admitting: Internal Medicine

## 2016-08-23 ENCOUNTER — Ambulatory Visit: Payer: Medicare Other | Admitting: Podiatry

## 2016-09-17 ENCOUNTER — Telehealth: Payer: Self-pay | Admitting: Internal Medicine

## 2016-09-17 NOTE — Telephone Encounter (Signed)
APT. REMINDER CALL, PHONE NOT IN SERVICE °

## 2016-09-18 ENCOUNTER — Ambulatory Visit (INDEPENDENT_AMBULATORY_CARE_PROVIDER_SITE_OTHER): Payer: Medicare Other | Admitting: Internal Medicine

## 2016-09-18 ENCOUNTER — Encounter: Payer: Self-pay | Admitting: Internal Medicine

## 2016-09-18 VITALS — BP 133/57 | HR 78 | Temp 98.2°F | Ht 72.0 in | Wt 202.6 lb

## 2016-09-18 DIAGNOSIS — Z79899 Other long term (current) drug therapy: Secondary | ICD-10-CM | POA: Diagnosis not present

## 2016-09-18 DIAGNOSIS — R634 Abnormal weight loss: Secondary | ICD-10-CM | POA: Diagnosis not present

## 2016-09-18 DIAGNOSIS — I152 Hypertension secondary to endocrine disorders: Secondary | ICD-10-CM

## 2016-09-18 DIAGNOSIS — R972 Elevated prostate specific antigen [PSA]: Secondary | ICD-10-CM

## 2016-09-18 DIAGNOSIS — E1159 Type 2 diabetes mellitus with other circulatory complications: Secondary | ICD-10-CM | POA: Diagnosis not present

## 2016-09-18 DIAGNOSIS — Z7984 Long term (current) use of oral hypoglycemic drugs: Secondary | ICD-10-CM | POA: Diagnosis not present

## 2016-09-18 DIAGNOSIS — D638 Anemia in other chronic diseases classified elsewhere: Secondary | ICD-10-CM

## 2016-09-18 DIAGNOSIS — I10 Essential (primary) hypertension: Secondary | ICD-10-CM

## 2016-09-18 DIAGNOSIS — E1140 Type 2 diabetes mellitus with diabetic neuropathy, unspecified: Secondary | ICD-10-CM | POA: Diagnosis not present

## 2016-09-18 DIAGNOSIS — D649 Anemia, unspecified: Secondary | ICD-10-CM

## 2016-09-18 DIAGNOSIS — R937 Abnormal findings on diagnostic imaging of other parts of musculoskeletal system: Secondary | ICD-10-CM

## 2016-09-18 DIAGNOSIS — G8929 Other chronic pain: Secondary | ICD-10-CM

## 2016-09-18 DIAGNOSIS — N529 Male erectile dysfunction, unspecified: Secondary | ICD-10-CM | POA: Diagnosis not present

## 2016-09-18 DIAGNOSIS — M545 Low back pain: Secondary | ICD-10-CM

## 2016-09-18 DIAGNOSIS — E1149 Type 2 diabetes mellitus with other diabetic neurological complication: Secondary | ICD-10-CM

## 2016-09-18 LAB — GLUCOSE, CAPILLARY: Glucose-Capillary: 230 mg/dL — ABNORMAL HIGH (ref 65–99)

## 2016-09-18 LAB — POCT GLYCOSYLATED HEMOGLOBIN (HGB A1C): HEMOGLOBIN A1C: 7.3

## 2016-09-18 MED ORDER — GLIPIZIDE 10 MG PO TABS: 10.0000 mg | ORAL_TABLET | Freq: Two times a day (BID) | ORAL | 1 refills | Status: DC

## 2016-09-18 NOTE — Patient Instructions (Signed)
General Instructions: - Glipizide refilled - Will check a TSH (thyroid level) today to make sure this is not contributing to your erectile dysfunction. Ok to continue total body medication you brought. - We will get MRI lumbar spine (back) scheduled - Follow up in 3 months with your new doctor!  Thank you for bringing your medicines today. This helps [place] keep you safe from mistakes.   Progress Toward Treatment Goals:  Treatment Goal 04/05/2015  Hemoglobin A1C at goal  Blood pressure at goal    Self Care Goals & Plans:  Self Care Goal 07/10/2016  Manage my medications take my medicines as prescribed; refill my medications on time; bring my medications to every visit  Monitor my health bring my glucose meter and log to each visit; keep track of my blood glucose; keep track of my blood pressure  Eat healthy foods eat foods that are low in salt; eat baked foods instead of fried foods; eat more vegetables  Be physically active find an activity I enjoy    Home Blood Glucose Monitoring 04/05/2015  Check my blood sugar once a day  When to check my blood sugar before breakfast     Care Management & Community Referrals:  No flowsheet data found.

## 2016-09-18 NOTE — Progress Notes (Signed)
CC: Follow up for low back pain  HPI:  Kevin Soto is a 56 y.o. man with PMHx as noted below who presents today for follow up of his low back pain.  LBP: Patient had reported at his last visit 2 months ago that he had a "nagging" low back pain. He noted the pain had been present for over 10 years but had acutely worsened. X-rays of the lumbar spine were obtained and revealed an abnormal L4 vertebral body with mixed lytic and sclerotic appearing lesions of the anterior portion. An MRI lumbar spine was then ordered but patient states there was some lack of communication because he never received a call back to schedule. He states his back pain resolved shortly after his visit in Feb. He denies any reoccurrence of the pain. He has also lost a significant amount of weight. His weight had been stable at 232 lbs for several years and then has gradually decreased since Oct 2017: 230 lbs (Oct '17)> 221 lbs (Dec '17)> 213 lbs (Feb '18)> 202 lbs today. His wife who accompanied him attributes this weight loss to their improvement in diet by cooking at home and not eating at restaurants.   HTN: BP mildly elevated at 144/69. Repeat BP 133/57. He is taking Coreg 25 mg BID, HCTZ 25 mg daily, Quinapril 40 mg daily, and Spironolactone 25 mg daily.  Type 2 DM: Last A1c 8.2. Today his A1c has improved to 7.3. He is taking Glipizide 10 mg BID. He notes he has been eating very well at home and has started going to the gym 3 times per week. He does weight lifting and swimming at the gym. He brought his glucometer today and blood sugars are mostly in the 120-220 range with no particular pattern during the day. He had one low of 46. His wife brought a log that included his blood sugars and also what he ate that day. They have found this helpful in seeing which foods cause his blood sugar to become elevated.   Erectile Dysfunction: Reports he did not try the Cialis that was prescribed last visit. Patient and his  wife report they have a family friend who recommended OTC total body men's enhancement pills. These pills contain vegetable steric acid and are "all natural." Patient states he is unable to maintain an erection to have sexual intercourse but does feel he is able to get an erection for a short period of time which is improvement.   Past Medical History:  Diagnosis Date  . Anemia   . CAD (coronary artery disease)    cath 8/12:  LM ok, mLAD occluded with trivial collats from right AM and dRCA, mDx (small) 80%, RI 25%, tiny branch of OM 90%, mAM 30-40%, RCA sub-totally occluded, then 80%, EF 40%, inf HK.  He underwent PCI with [name] with placement of a Promus DES to the mRCA x 2  . Diabetes mellitus   . Dyslipidemia   . Gait instability 02/27/2011  . History of alcohol abuse   . History of stroke    evidence of CVA on MRI in past  . Hyperlipidemia   . Hypertension   . Ischemic cardiomyopathy    echo 7/12: EF 40-45%, septal, apical, inf basal HK, mod LVH, mild MR, mild LAE.  EF 55% by echo 2015  . Macular hemorrhage   . Sleep apnea   . Stroke [facility]) 2012   uses cane to steady due to dizziness  . Vision, loss, sudden  02/27/2011    Review of Systems:   All negative except per HPI  Physical Exam:  Vitals:   09/18/16 1441 09/18/16 1507  BP: (!) 144/69 (!) 133/57  Pulse: 78 78  Temp: 98.2 F (36.8 C)   TempSrc: Oral   SpO2: 100%   Weight: 202 lb 9.6 oz (91.9 kg)   Height: 6' (1.829 m)    Repeat BP 133/57  General: Well-nourished man in NAD HEENT: EOMI, sclera anicteric, mucus membranes moist CV: RRR, no m/g/r Pulm: CTA bilaterally, breaths non-labored Abd: BS+, soft, non-tender Ext: no peripheral edema. Strength 5/5 in lower extremities bilaterally  Assessment & Plan:   See Encounters Tab for problem based charting.  Patient discussed with Dr. Angelia Mould

## 2016-09-19 LAB — PSA: Prostate Specific Ag, Serum: 43.6 ng/mL — ABNORMAL HIGH (ref 0.0–4.0)

## 2016-09-19 LAB — TSH: TSH: 1.56 u[IU]/mL (ref 0.450–4.500)

## 2016-09-19 NOTE — Assessment & Plan Note (Signed)
BP well-controlled -Continue current regimen 

## 2016-09-19 NOTE — Assessment & Plan Note (Addendum)
His A1c has improved significantly and this is likely due to his dramatic weight loss. I am concerned about how quickly he is losing weight and based on work up could represent prostate cancer. Advised him to continue Glipizide 10 mg BID for now but this may need to be decreased, especially if he has more hypoglycemic episodes or further weight loss.

## 2016-09-19 NOTE — Assessment & Plan Note (Addendum)
Patient's low back pain has resolved, but his lumbar x-ray findings of sclerotic lesions at L4 are concerning especially in the setting of his significant weight loss. His improved dietary intake could be contributing to his weight loss but I feel he has been losing weight too quickly to be entirely diet related. He also has a chronic normocytic anemia that was worked up extensively, including by heme/onc and was attributed to anemia of chronic disease. However, he did note having back pain for almost 10 years. A PSA level was checked today and was elevated at 43. I am concerned about prostate cancer and that his lumbar x-ray findings represent metastatic disease. Will have patient see Urology for prostate biopsy and will obtain MRI lumbar spine for further evaluation of mets. He may need additional imaging if MRI reveals multiple metastatic lesions.

## 2016-09-19 NOTE — Assessment & Plan Note (Signed)
Based on his work up for his back pain it's possible that prostate cancer could be the reason for his erectile dysfunction. This is being evaluated further. A TSH was checked and was normal. Advised patient that it was ok to use his OTC male enhancement medication.

## 2016-09-25 ENCOUNTER — Telehealth: Payer: Self-pay | Admitting: Internal Medicine

## 2016-09-25 NOTE — Telephone Encounter (Signed)
Called patient's phone but no answer. Called wife's phone and left message to call back office number with times that they are available to talk tomorrow so I can call back.

## 2016-09-25 NOTE — Progress Notes (Signed)
Internal Medicine Clinic Attending  Case discussed with Dr. Rivet soon after the resident saw the patient.  We reviewed the resident's history and exam and pertinent patient test results.  I agree with the assessment, diagnosis, and plan of care documented in the resident's note.  

## 2016-09-26 ENCOUNTER — Ambulatory Visit: Payer: Medicare Other | Admitting: Podiatry

## 2016-09-26 ENCOUNTER — Telehealth: Payer: Self-pay | Admitting: Internal Medicine

## 2016-09-26 ENCOUNTER — Telehealth: Payer: Self-pay

## 2016-09-26 NOTE — Telephone Encounter (Signed)
Requesting lab result.

## 2016-09-26 NOTE — Telephone Encounter (Signed)
Left message for patient to call back with a good time to discuss lab results.

## 2016-09-27 ENCOUNTER — Ambulatory Visit (HOSPITAL_COMMUNITY)
Admission: RE | Admit: 2016-09-27 | Discharge: 2016-09-27 | Disposition: A | Discharge status: Home / Self Care | Payer: Medicare Other | Source: Ambulatory Visit | Attending: Student in an Organized Health Care Education/Training Program | Admitting: Student in an Organized Health Care Education/Training Program

## 2016-09-27 DIAGNOSIS — M48061 Spinal stenosis, lumbar region without neurogenic claudication: Secondary | ICD-10-CM | POA: Insufficient documentation

## 2016-09-27 DIAGNOSIS — M545 Low back pain: Secondary | ICD-10-CM

## 2016-09-27 DIAGNOSIS — G8929 Other chronic pain: Secondary | ICD-10-CM

## 2016-09-27 DIAGNOSIS — M5136 Other intervertebral disc degeneration, lumbar region: Secondary | ICD-10-CM | POA: Insufficient documentation

## 2016-09-27 DIAGNOSIS — M5137 Other intervertebral disc degeneration, lumbosacral region: Secondary | ICD-10-CM | POA: Diagnosis not present

## 2016-09-27 NOTE — Telephone Encounter (Signed)
Reviewed lab results with patient and his wife. Voiced understanding. They have a Urology appointment scheduled already.

## 2016-10-05 ENCOUNTER — Telehealth: Payer: Self-pay | Admitting: Internal Medicine

## 2016-10-05 NOTE — Telephone Encounter (Signed)
Has questions about his medical conditions discussed at his last office visit with you.

## 2016-10-08 NOTE — Telephone Encounter (Signed)
Addressed questions with patient's wife. Also discussed MRI results are reassuring that lesion in lumbar spine likely not a met. They will see urology soon for prostate biopsy.

## 2016-10-19 ENCOUNTER — Encounter: Payer: Self-pay | Admitting: Internal Medicine

## 2016-10-19 ENCOUNTER — Ambulatory Visit (INDEPENDENT_AMBULATORY_CARE_PROVIDER_SITE_OTHER): Payer: Medicare Other | Admitting: Internal Medicine

## 2016-10-19 VITALS — BP 95/61 | HR 72 | Temp 98.4°F | Ht 72.0 in | Wt 199.8 lb

## 2016-10-19 DIAGNOSIS — N183 Chronic kidney disease, stage 3 (moderate): Secondary | ICD-10-CM

## 2016-10-19 DIAGNOSIS — R634 Abnormal weight loss: Secondary | ICD-10-CM | POA: Diagnosis not present

## 2016-10-19 DIAGNOSIS — E1122 Type 2 diabetes mellitus with diabetic chronic kidney disease: Secondary | ICD-10-CM | POA: Diagnosis not present

## 2016-10-19 DIAGNOSIS — R1084 Generalized abdominal pain: Secondary | ICD-10-CM

## 2016-10-19 DIAGNOSIS — D649 Anemia, unspecified: Secondary | ICD-10-CM

## 2016-10-19 DIAGNOSIS — Z8639 Personal history of other endocrine, nutritional and metabolic disease: Secondary | ICD-10-CM

## 2016-10-19 DIAGNOSIS — R972 Elevated prostate specific antigen [PSA]: Secondary | ICD-10-CM

## 2016-10-19 DIAGNOSIS — I10 Essential (primary) hypertension: Secondary | ICD-10-CM

## 2016-10-19 DIAGNOSIS — E1159 Type 2 diabetes mellitus with other circulatory complications: Secondary | ICD-10-CM

## 2016-10-19 DIAGNOSIS — R109 Unspecified abdominal pain: Secondary | ICD-10-CM | POA: Insufficient documentation

## 2016-10-19 DIAGNOSIS — I129 Hypertensive chronic kidney disease with stage 1 through stage 4 chronic kidney disease, or unspecified chronic kidney disease: Secondary | ICD-10-CM | POA: Diagnosis not present

## 2016-10-19 DIAGNOSIS — D72829 Elevated white blood cell count, unspecified: Secondary | ICD-10-CM

## 2016-10-19 LAB — COMPREHENSIVE METABOLIC PANEL
ALBUMIN: 3.6 g/dL (ref 3.5–5.0)
ALT: 14 U/L — ABNORMAL LOW (ref 17–63)
AST: 16 U/L (ref 15–41)
Alkaline Phosphatase: 67 U/L (ref 38–126)
Anion gap: 8 (ref 5–15)
BILIRUBIN TOTAL: 0.7 mg/dL (ref 0.3–1.2)
BUN: 40 mg/dL — AB (ref 6–20)
CHLORIDE: 101 mmol/L (ref 101–111)
CO2: 25 mmol/L (ref 22–32)
Calcium: 10.1 mg/dL (ref 8.9–10.3)
Creatinine, Ser: 2.57 mg/dL — ABNORMAL HIGH (ref 0.61–1.24)
GFR calc Af Amer: 30 mL/min — ABNORMAL LOW (ref >60)
GFR calc non Af Amer: 26 mL/min — ABNORMAL LOW (ref >60)
GLUCOSE: 184 mg/dL — AB (ref 65–99)
Potassium: 4.5 mmol/L (ref 3.5–5.1)
Sodium: 134 mmol/L — ABNORMAL LOW (ref 135–145)
Total Protein: 7.6 g/dL (ref 6.5–8.1)

## 2016-10-19 LAB — CBC WITH DIFFERENTIAL/PLATELET
BASOS ABS: 0 10*3/uL (ref 0.0–0.1)
Basophils Relative: 0 %
Eosinophils Absolute: 0.3 10*3/uL (ref 0.0–0.7)
Eosinophils Relative: 3 %
HEMATOCRIT: 32.1 % — AB (ref 39.0–52.0)
Hemoglobin: 10.5 g/dL — ABNORMAL LOW (ref 13.0–17.0)
Lymphocytes Relative: 21 %
Lymphs Abs: 2.3 10*3/uL (ref 0.7–4.0)
MCH: 27.9 pg (ref 26.0–34.0)
MCHC: 32.7 g/dL (ref 30.0–36.0)
MCV: 85.1 fL (ref 78.0–100.0)
MONO ABS: 0.6 10*3/uL (ref 0.1–1.0)
Monocytes Relative: 5 %
Neutro Abs: 8 10*3/uL — ABNORMAL HIGH (ref 1.7–7.7)
Neutrophils Relative %: 71 %
PLATELETS: 300 10*3/uL (ref 150–400)
RBC: 3.77 MIL/uL — ABNORMAL LOW (ref 4.22–5.81)
RDW: 13.3 % (ref 11.5–15.5)
WBC: 11.2 10*3/uL — ABNORMAL HIGH (ref 4.0–10.5)

## 2016-10-19 NOTE — Patient Instructions (Addendum)
Kevin Soto it was a pleasure meeting you today.   STOP taking Spironolactone and Lisinopril.   Continue taking Hydrochlorothiazide and Carvedilol.   I have referred to gastroenterology for a colonoscopy.   I have also referred you to Nephrology ([facility]) because your kidney function has been declining over time.   Please return for a follow-up visit in 2 weeks.

## 2016-10-20 NOTE — Assessment & Plan Note (Addendum)
Assessment Blood pressure soft at this visit but patient asymptomatic.   Plan -Quinapril and spironolactone were discontinued. -Continue hydrochlorothiazide and coreg -Return to clinic in 2 weeks  Please see note on CKD 3 from this visit.

## 2016-10-20 NOTE — Assessment & Plan Note (Addendum)
Assessment Patient has stage III chronic kidney disease and is currently on an ACEI and spironolactone. Prior labs showing hyperkalemia. Repeat labs at this visit showing normal potassium (4.5). However, his renal function has been worsening over time and creatinine is now 2.5. Blood pressure at this visit soft but patient asymptomatic.  Plan -Advised patient to stop taking quinapril and spironolactone -Continue other antihypertensives including hydrochlorothiazide and Coreg -Referral to nephrology  Addendum 10/22/2016 at 5:42 pm: Patient does have diabetes and hypertension but the extent of his chronic illnesses does not explain his worsening renal failure. Would also consider multiple myeloma on the differential in the setting of unexplained weight loss and back pain reported during prior visit. Tried calling the patient, wife said he was not available at this time. Will try calling him again tomorrow to see if he is able to come into the clinic for further labwork.  Addendum 10/23/2016 at 5:52 pm: Spoke to the patient over the phone. Discussed getting additional lab work and patient agreed. Patient will be here within the next 1-2 days to get lab work done.  Orders for the following labs have been placed: Urinalysis with microscopy Urine microalbumin to creatinine ratio HIV antibody Hep C antibody Hepatitis B surface antigen, surface antibody, and core antibody Serum protein electrophoresis Kappa/ lambda light chains  Renal ultrasound has been ordered as well  Addendum 10/26/2016 at 1:12 PM: UA showing trace glucose and ketones (patient has type 2 diabetes). Ketones could also likely be secondary to starvation as patient has been losing weight. Hepatitis B testing negative. Hepatitis C antibody negative. HIV antibody nonreactive.   Addendum 10/26/2016 at 5:28 pm: Microalbumin/ Cr ratio slightly elevated at 32. Will hold off ACEI/ ARB at this time due to worsening renal function. SPEP  normal. Kappa/ lambda light chain ratio mildly elevated at 1.7 likely in the setting of CKD. Spoke to the patient over the phone and discussed lab results. States his renal US is scheduled on June 11.   Addendum 10/26/2016 at 6:03 pm: Urine microscopic examination without RBCs or casts.   Addendum 10/30/2016 at 12:56 pm: Renal US normal. Spoke to the patient over the phone. Encouraged him to make an appointment with nephrology.

## 2016-10-20 NOTE — Assessment & Plan Note (Addendum)
History of present illness Patient reports having a 4 day history of diffuse abdominal pain which is now improving. He describes the pain as dull in quality and intermittent in nature. States the pain is not associated with eating. Denies having any nausea, vomiting, diarrhea, or constipation. Denies having any GERD symptoms. Denies having any melena, hematochezia, or hematemesis. Patient was accompanied by his wife who stated they got married in November 2017. Per wife, since after they have been married, they have stopped eating out and cook most of the meals at home. Wife believes patient has lost his appetite for food over time and she is concerned he is losing a lot of weight. Patient denies having any fevers, night sweats, or fatigue. Denies having any dysphagia or odynophagia. He is a never smoker and denies any history of occupational exposures. He consumes alcohol 2-3 times per month.  Assessment Unintentional weight loss and loss of appetite bring up the concern for possible colon cancer considering patient's age. He has lost 33 pounds since September 2017. Although patient denied having any fatigue, he appeared lethargic on exam. Last colonoscopy done in February 2014 was normal. LFTs checked at this visit normal. Patient does not endorse any active GI bleed and hemoglobin stable at 10.5 (anemia of chronic disease in the setting of CKD 3). He does have a mild leukocytosis on labs (white count 11.2) but other than the abdominal pain does not have any other GI complaints. PSA checked during patient's prior visit on 09/18/2016 was 43.6. He was referred to urology at that time and confirms he has an upcoming appointment this month. At the prior visit, lumbar MRI was ordered (due to complain of back pain) and did not show any signs of metastasis. Although lung cancer is common among men and can metastasize to the GI tract, patient is a never smoker making him low risk.   Plan -Referral to  gastroenterology for repeat colonoscopy -Return to clinic in 2 weeks

## 2016-10-20 NOTE — Progress Notes (Signed)
   CC: Patient is complaining of abdominal pain. Stage III chronic kidney disease was also discussed during this visit.  HPI:  [name] is a 56 y.o. male with a past medical history of conditions listed below presenting to the clinic complaining of abdominal pain. Stage III chronic kidney disease was also discussed during this visit. Please see problem based charting for the status of the patient's current and chronic medical conditions.   Past Medical History:  Diagnosis Date  . Anemia   . CAD (coronary artery disease)    cath 8/12:  LM ok, mLAD occluded with trivial collats from right AM and dRCA, mDx (small) 80%, RI 25%, tiny branch of OM 90%, mAM 30-40%, RCA sub-totally occluded, then 80%, EF 40%, inf HK.  He underwent PCI with [name] with placement of a Promus DES to the mRCA x 2  . Diabetes mellitus   . Dyslipidemia   . Gait instability 02/27/2011  . History of alcohol abuse   . History of stroke    evidence of CVA on MRI in past  . Hyperlipidemia   . Hypertension   . Ischemic cardiomyopathy    echo 7/12: EF 40-45%, septal, apical, inf basal HK, mod LVH, mild MR, mild LAE.  EF 55% by echo 2015  . Macular hemorrhage   . Sleep apnea   . Stroke [facility]) 2012   uses cane to steady due to dizziness  . Vision, loss, sudden 02/27/2011    Review of Systems:  Pertinent positives mentioned in HPI. Remainder of all ROS negative.   Physical Exam:  Vitals:   10/19/16 1004 10/19/16 1102  BP: 99/61 95/61  Pulse: 74 72  Temp: 98.4 F (36.9 C)   TempSrc: Oral   SpO2: 100%   Weight: 199 lb 12.8 oz (90.6 kg)   Height: 6' (1.829 m)    Physical Exam  Constitutional: He is oriented to person, place, and time. No distress.  Seems lethargic  HENT:  Head: Normocephalic and atraumatic.  Eyes: Right eye exhibits no discharge. Left eye exhibits no discharge.  Neck: Neck supple. No tracheal deviation present.  Cardiovascular: Normal rate, regular rhythm and intact distal  pulses.   Pulmonary/Chest: Effort normal and breath sounds normal. No respiratory distress. He has no wheezes.  Abdominal: Soft. Bowel sounds are normal. He exhibits no distension. There is no tenderness.  Loose skin and stretch marks noted  Musculoskeletal: Normal range of motion. He exhibits no edema.  Neurological: He is alert and oriented to person, place, and time.  Skin: Skin is warm and dry.    Assessment & Plan:   See Encounters Tab for problem based charting.  Patient discussed with Dr. Lynnae January

## 2016-10-22 ENCOUNTER — Other Ambulatory Visit: Payer: Self-pay | Admitting: Internal Medicine

## 2016-10-22 DIAGNOSIS — D649 Anemia, unspecified: Secondary | ICD-10-CM

## 2016-10-22 NOTE — Progress Notes (Signed)
Internal Medicine Clinic Attending  Case discussed with Dr. Marlowe Sax at the time of the visit.  We reviewed the resident's history and exam and pertinent patient test results.  I agree with the assessment, diagnosis, and plan of care documented in the resident's note. This is worrisome - weight loss, worsening renal failure, lower BP. Pt did not make f/U appt, I sent message tot front desk to sch appt. GI referral bc colonoscopy prep was only fair. Does have anemia (though likely renal failure). His degree of DM and HTN control does noto explain the worsening renal failure. Would add MM to diff dx.

## 2016-10-22 NOTE — Addendum Note (Signed)
Addended by: Truddie Crumble on: 10/22/2016 11:53 AM   Modules accepted: Orders

## 2016-10-23 NOTE — Addendum Note (Signed)
Addended by: Shela Leff on: 10/23/2016 06:00 PM   Modules accepted: Orders

## 2016-10-23 NOTE — Assessment & Plan Note (Addendum)
Assessment  Hemoglobin stable at 10.5.  Plan -Ferritin has been ordered. Pt will be returning to the clinic for additional labwork.   Addendum 10/26/2016 at 1:12 PM: Ferritin normal (117).

## 2016-10-25 ENCOUNTER — Other Ambulatory Visit (INDEPENDENT_AMBULATORY_CARE_PROVIDER_SITE_OTHER): Payer: Medicare Other

## 2016-10-25 ENCOUNTER — Encounter: Payer: Self-pay | Admitting: Physician Assistant

## 2016-10-25 ENCOUNTER — Ambulatory Visit (INDEPENDENT_AMBULATORY_CARE_PROVIDER_SITE_OTHER): Payer: Medicare Other | Admitting: Physician Assistant

## 2016-10-25 VITALS — BP 100/60 | HR 76 | Ht 72.0 in | Wt 203.0 lb

## 2016-10-25 DIAGNOSIS — N183 Chronic kidney disease, stage 3 unspecified: Secondary | ICD-10-CM

## 2016-10-25 DIAGNOSIS — R630 Anorexia: Secondary | ICD-10-CM | POA: Diagnosis not present

## 2016-10-25 DIAGNOSIS — R1084 Generalized abdominal pain: Secondary | ICD-10-CM

## 2016-10-25 DIAGNOSIS — R972 Elevated prostate specific antigen [PSA]: Secondary | ICD-10-CM | POA: Diagnosis not present

## 2016-10-25 DIAGNOSIS — E1122 Type 2 diabetes mellitus with diabetic chronic kidney disease: Secondary | ICD-10-CM

## 2016-10-25 DIAGNOSIS — D649 Anemia, unspecified: Secondary | ICD-10-CM | POA: Diagnosis not present

## 2016-10-25 DIAGNOSIS — R634 Abnormal weight loss: Secondary | ICD-10-CM

## 2016-10-25 NOTE — Progress Notes (Signed)
Subjective:    Patient ID: Kevin Kevin Soto, male    DOB: 04-06-1961, 56 y.o.   MRN: [id number]  HPI Kevin is a pleasant 56 year old African-American male referred today by Kevin Kevin Soto for evaluation of abdominal pain decrease in appetite and unintentional weight loss. Patient is known to Kevin Kevin Soto from previous colonoscopy done in 2014 which was a normal exam. Patient has history of hypertension, review of CVA for which she is on Plavix and aspirin, he has a dilated cardiomyopathy with EF of 55%, and adult-onset diabetes mellitus. Patient's wife says he had gradually lost from 230 pounds to 199 pounds over the past 8-9 months. He had been complaining of a "stomachache" over the past month or so. When asked he says he he had an aching discomfort is scleral abdomen. He had a couple of episodes of vomiting. No fever or chills no changes in bowel habits melena or hematochezia. He denies any dysphagia or odynophagia. They had tried Tums and Pepto-Bismol without any improvement in symptoms. He denies any regular aspirin or NSAID use. No previous abdominal surgeries. Interestingly over the past week or so his abdominal pain has premeds subsided and his appetite has improved. His wife says he has gained a couple of pounds which is very encouraging. He has not had any recent imaging, labs done on 09/18/2016 show a PSA of 43.6, WBC of 11.2, hemoglobin 10.5 hematocrit of 32 creatinine of 2.5 LFTs within normal limits. He had MRI of the lumbar spine in May 2018 which showed multilevel lumbar disc disease  Review of Systems Pertinent positive and negative review of systems were noted in the above HPI section.  All other review of systems was otherwise negative.  Outpatient Encounter Prescriptions as of 10/25/2016  Medication Sig  . ACCU-CHEK FASTCLIX LANCETS MISC Use to check blood sugar two times a day  . aspirin EC 81 MG tablet Take 1 tablet (81 mg total) by mouth daily.  Marland Kitchen atorvastatin (LIPITOR) 40 MG  tablet Take 1 tablet (40 mg total) by mouth daily.  . Blood Glucose Monitoring Suppl (ACCU-CHEK GUIDE) w/Device KIT 1 each by Does not apply route 2 (two) times daily.  . clopidogrel (PLAVIX) 75 MG tablet TAKE 1 TABLET BY MOUTH  DAILY  . diclofenac sodium (VOLTAREN) 1 % GEL Apply 4 g topically 4 (four) times daily.  Marland Kitchen glipiZIDE (GLUCOTROL) 10 MG tablet Take 1 tablet (10 mg total) by mouth 2 (two) times daily before a meal.  . glucose blood (ACCU-CHEK GUIDE) test strip Use as instructed  . nitroGLYCERIN (NITROSTAT) 0.4 MG SL tablet Place 1 tablet (0.4 mg total) under the tongue every 5 (five) minutes as needed.  . tadalafil (CIALIS) 10 MG tablet Take 1 tablet (10 mg total) by mouth daily as needed for erectile dysfunction.  . [DISCONTINUED] carvedilol (COREG) 25 MG tablet TAKE 1 TABLET BY MOUTH  TWICE A DAY WITH MEALS  . [DISCONTINUED] hydrochlorothiazide (HYDRODIURIL) 25 MG tablet TAKE 1 TABLET BY MOUTH  DAILY   No facility-administered encounter medications on file as of 10/25/2016.    Allergies  Allergen Reactions  . Clonidine Derivatives     Dizzy, too sleepy   Patient Active Problem List   Diagnosis Date Noted  . Abdominal pain 10/19/2016  . Low back pain 07/11/2016  . Erectile dysfunction 05/08/2016  . Advanced care planning/counseling discussion 01/31/2016  . CKD stage 3 due to type 2 diabetes mellitus ([place]) 08/18/2014  . Normocytic anemia 06/24/2012  . Preventative health care 06/24/2012  .  Hyperlipidemia due to type 2 diabetes mellitus ([place]) 02/16/2011  . Atherosclerotic heart disease of native coronary artery with unspecified angina pectoris ([place]) 01/26/2011  . Diabetes mellitus type 2 with neurological manifestations ([place]) 12/15/2010  . Dilated cardiomyopathy ([place])   . Hypertension associated with diabetes ([place])   . CVA (cerebral vascular accident) ([place]) 11/28/2010   Social History   Social History  . Marital status: Married    Spouse name: Kevin Kevin Soto  . Number of children:  0  . Years of education: N/A   Occupational History  . disabled    Social History Main Topics  . Smoking status: Never Smoker  . Smokeless tobacco: Never Used  . Alcohol use 2.4 oz/week    2 Cans of beer, 2 Shots of liquor per week     Comment: Rarely.  . Drug use: No  . Sexual activity: Not on file   Other Topics Concern  . Not on file   Social History Narrative   Lives in [place] with his cousin   No children   Exercises 3 times a week    [name] family history includes Crohn's disease in his other and sister; Diabetes in his father and mother; Heart attack in his father and mother; Heart disease in his sister; Heart failure in his father and mother; Kidney disease in his brother; Prostate cancer in his father. He was adopted.      Objective:    Vitals:   10/25/16 0921  BP: 100/60  Pulse: 76    Physical Exam  well-developed African-American male in no acute distress, accompanied by his wife, he has a wide-based gait and is somewhat unsteady, Blood pressure 100/60 pulse 76, height 6 foot, weight 203, BMI 27.5. HEENT; nontraumatic normocephalic EOMI PERRLA sclera anicteric, Cardiovascular regular rate and rhythm with S1-S2, Pulmonary ;clear bilaterally, Abdomen ;soft, there is no focal tenderness no guarding or rebound no palpable mass or hepatosplenomegaly no appreciable ascites, Rectal; exam not done, Extremities ;no clubbing cyanosis or edema skin warm and dry, Neuropsych ;mood and affect appropriate he has had prior CVA and speech is somewhat slow       Assessment & Plan:   #27 56 year old African-American male with weight loss of about 30 pounds over the past 8-9 months, decrease in appetite and with more recent complaints of rather generalized abdominal discomfort over the past month. Patient and wife stating that his abdominal pain has resolved over the past week or so and he is eating better. Etiology of his symptoms is not clear, certainly concerning  with significant unintentional weight loss. Recent labs show markedly increased PSA very concerning for prostate cancer, rule out metastatic. Rule out other occult malignancy #2 chronic kidney disease stage III   #3 adult-onset diabetes mellitus #4 dilated cardiomyopathy with EF 55% #5 previous CVA #6 Chronic antiplatelet therapy with Plavix and aspirin #7 Hypertension #8 Colon cancer surveillance-normal colonoscopy February 2014  Plan; Long discussion with patient and wife. Will schedule for CT of the abdomen and pelvis with oral but no IV contrast Urology consultation is in progress with appointment scheduled for later this month. Further plans pending findings on CT.  Royalti Schauf S Firas Guardado PA-C 10/25/2016   Cc: Shela Leff, MD

## 2016-10-25 NOTE — Patient Instructions (Signed)
  You have been scheduled for a CT scan of the abdomen and pelvis at [facility] ([address]---this is in the same building as Press [facility]).   You are scheduled on 10-30-2016 at 4:30 am. You should arrive at 4:15  to your appointment time for registration. Please follow the written instructions below on the day of your exam:  WARNING: IF YOU ARE ALLERGIC TO IODINE/X-RAY DYE, PLEASE NOTIFY RADIOLOGY IMMEDIATELY AT [phone]! YOU WILL BE GIVEN A 13 HOUR PREMEDICATION PREP.  1) Do not eat or drink anything after 12:30 PM (4 hours prior to your test) 2) You have been given 2 bottles of oral contrast to drink. The solution may taste               better if refrigerated, but do NOT add ice or any other liquid to this solution. Shake             well before drinking.    Drink 1 bottle of contrast @ 2:30 pm (2 hours prior to your exam)  Drink 1 bottle of contrast @ 3:30 pm (1 hour prior to your exam)  You may take any medications as prescribed with a small amount of water except for the following: Metformin, Glucophage, Glucovance, Avandamet, Riomet, Fortamet, Actoplus Met, Janumet, Glumetza or Metaglip. The above medications must be held the day of the exam AND 48 hours after the exam.  The purpose of you drinking the oral contrast is to aid in the visualization of your intestinal tract. The contrast solution may cause some diarrhea. Before your exam is started, you will be given a small amount of fluid to drink. Depending on your individual set of symptoms, you may also receive an intravenous injection of x-ray contrast/dye. Plan on being at [facility] for 30 minutes or long, depending on the type of exam you are having performed.  If you have any questions regarding your exam or if you need to reschedule, you may call the CT department at [phone] between the hours of 8:00 am and 5:00 pm,  Monday-Friday.  ________________________________________________________________________

## 2016-10-26 DIAGNOSIS — R972 Elevated prostate specific antigen [PSA]: Secondary | ICD-10-CM | POA: Insufficient documentation

## 2016-10-26 LAB — URINALYSIS, ROUTINE W REFLEX MICROSCOPIC
Bilirubin, UA: NEGATIVE
Leukocytes, UA: NEGATIVE
Nitrite, UA: NEGATIVE
RBC UA: NEGATIVE
Specific Gravity, UA: 1.021 (ref 1.005–1.030)
Urobilinogen, Ur: 1 mg/dL (ref 0.2–1.0)
pH, UA: 5 (ref 5.0–7.5)

## 2016-10-26 LAB — PROTEIN ELECTROPHORESIS, SERUM
A/G RATIO SPE: 1 (ref 0.7–1.7)
Albumin ELP: 3.5 g/dL (ref 2.9–4.4)
Alpha 1: 0.2 g/dL (ref 0.0–0.4)
Alpha 2: 0.9 g/dL (ref 0.4–1.0)
Beta: 1 g/dL (ref 0.7–1.3)
GLOBULIN, TOTAL: 3.6 g/dL (ref 2.2–3.9)
Gamma Globulin: 1.5 g/dL (ref 0.4–1.8)
TOTAL PROTEIN: 7.1 g/dL (ref 6.0–8.5)

## 2016-10-26 LAB — MICROALBUMIN / CREATININE URINE RATIO
CREATININE, UR: 258.8 mg/dL
MICROALB/CREAT RATIO: 32 mg/g{creat} — AB (ref 0.0–30.0)
Microalbumin, Urine: 82.8 ug/mL

## 2016-10-26 LAB — HIV ANTIBODY (ROUTINE TESTING W REFLEX): HIV SCREEN 4TH GENERATION: NONREACTIVE

## 2016-10-26 LAB — KAPPA/LAMBDA LIGHT CHAINS
Ig Kappa Free Light Chain: 65.2 mg/L — ABNORMAL HIGH (ref 3.3–19.4)
Ig Lambda Free Light Chain: 37.8 mg/L — ABNORMAL HIGH (ref 5.7–26.3)
Kappa/Lambda FluidC Ratio: 1.72 — ABNORMAL HIGH (ref 0.26–1.65)

## 2016-10-26 LAB — HEPATITIS C ANTIBODY

## 2016-10-26 LAB — HEPATITIS B CORE ANTIBODY, TOTAL: Hep B Core Total Ab: NEGATIVE

## 2016-10-26 LAB — FERRITIN: Ferritin: 117 ng/mL (ref 30–400)

## 2016-10-26 LAB — HEPATITIS B SURFACE ANTIBODY,QUALITATIVE: Hep B Surface Ab, Qual: NONREACTIVE

## 2016-10-26 LAB — HEPATITIS B SURFACE ANTIGEN: Hepatitis B Surface Ag: NEGATIVE

## 2016-10-26 NOTE — Assessment & Plan Note (Addendum)
PSA checked during prior visit was 43.6. Patient has unintentionally lost 33 lbs since September 2017 (see note on abdominal pain). He is an [place] American male. I am very much concerned about possible prostate cancer. Patient was already referred to Urology during his prior visit. I spoke to him and his wife over the phone again today and explained to them the patient will need a prostate biopsy as soon as possible. They confirmed having an upcoming appointment with [facility] on 11/12/2016. I encouraged them to go to this appointment and requested the patient to sign a release form there so urology can fax me his future records. Patient and wife agreed with the plan.

## 2016-10-26 NOTE — Progress Notes (Signed)
I agree with the above note, plan 

## 2016-10-28 LAB — URINALYSIS, MICROSCOPIC ONLY
Casts: NONE SEEN /lpf
EPITHELIAL CELLS (NON RENAL): NONE SEEN /HPF (ref 0–10)

## 2016-10-28 LAB — SPECIMEN STATUS REPORT

## 2016-10-29 ENCOUNTER — Ambulatory Visit (HOSPITAL_COMMUNITY)
Admission: RE | Admit: 2016-10-29 | Discharge: 2016-10-29 | Disposition: A | Discharge status: Home / Self Care | Payer: Medicare Other | Source: Ambulatory Visit | Attending: Internal Medicine | Admitting: Internal Medicine

## 2016-10-29 DIAGNOSIS — N183 Chronic kidney disease, stage 3 (moderate): Secondary | ICD-10-CM | POA: Insufficient documentation

## 2016-10-29 DIAGNOSIS — E1122 Type 2 diabetes mellitus with diabetic chronic kidney disease: Secondary | ICD-10-CM | POA: Diagnosis not present

## 2016-10-29 DIAGNOSIS — R93429 Abnormal radiologic findings on diagnostic imaging of unspecified kidney: Secondary | ICD-10-CM | POA: Diagnosis not present

## 2016-10-30 ENCOUNTER — Ambulatory Visit (INDEPENDENT_AMBULATORY_CARE_PROVIDER_SITE_OTHER)
Admission: RE | Admit: 2016-10-30 | Discharge: 2016-10-30 | Disposition: A | Discharge status: Home / Self Care | Payer: Medicare Other | Source: Ambulatory Visit | Attending: Physician Assistant | Admitting: Physician Assistant

## 2016-10-30 ENCOUNTER — Ambulatory Visit: Payer: Medicare Other | Admitting: Podiatry

## 2016-10-30 ENCOUNTER — Encounter: Payer: Self-pay | Admitting: *Deleted

## 2016-10-30 DIAGNOSIS — R634 Abnormal weight loss: Secondary | ICD-10-CM

## 2016-10-30 DIAGNOSIS — R1084 Generalized abdominal pain: Secondary | ICD-10-CM | POA: Diagnosis not present

## 2016-10-30 DIAGNOSIS — R630 Anorexia: Secondary | ICD-10-CM

## 2016-10-30 DIAGNOSIS — R972 Elevated prostate specific antigen [PSA]: Secondary | ICD-10-CM

## 2016-11-03 ENCOUNTER — Other Ambulatory Visit: Payer: Self-pay | Admitting: Internal Medicine

## 2016-11-05 ENCOUNTER — Telehealth: Payer: Self-pay | Admitting: Physician Assistant

## 2016-11-05 NOTE — Telephone Encounter (Signed)
See Imaging results. Left a message to call agin.

## 2016-11-07 ENCOUNTER — Other Ambulatory Visit: Payer: Self-pay

## 2016-11-07 NOTE — Progress Notes (Signed)
Dear Dr. Fuller Plan,  Patient may hold Plavix for 5 days prior to the procedure and may resume the medication the day after the procedure.  Sincerely,  Wandra Feinstein

## 2016-11-12 DIAGNOSIS — R972 Elevated prostate specific antigen [PSA]: Secondary | ICD-10-CM | POA: Diagnosis not present

## 2016-11-12 DIAGNOSIS — N184 Chronic kidney disease, stage 4 (severe): Secondary | ICD-10-CM | POA: Diagnosis not present

## 2016-11-28 NOTE — Telephone Encounter (Signed)
Opened in error

## 2016-11-30 ENCOUNTER — Telehealth: Payer: Self-pay | Admitting: Internal Medicine

## 2016-11-30 NOTE — Telephone Encounter (Signed)
Per Elpidio Eric @ [facility] sent a letter on 11/07/2016 Via Epic in reference to him having an endoscopic Procedure on 12/18/2016.  Please address the 11/07/2016 letter about his medications for this procedure as they have called once again per their office .  Please advise.

## 2016-12-04 ENCOUNTER — Encounter: Payer: Self-pay | Admitting: Internal Medicine

## 2016-12-04 ENCOUNTER — Ambulatory Visit (AMBULATORY_SURGERY_CENTER): Payer: Self-pay

## 2016-12-04 VITALS — Ht 72.0 in | Wt 204.6 lb

## 2016-12-04 DIAGNOSIS — R1030 Lower abdominal pain, unspecified: Secondary | ICD-10-CM

## 2016-12-04 DIAGNOSIS — R634 Abnormal weight loss: Secondary | ICD-10-CM

## 2016-12-04 DIAGNOSIS — R935 Abnormal findings on diagnostic imaging of other abdominal regions, including retroperitoneum: Secondary | ICD-10-CM

## 2016-12-04 DIAGNOSIS — I2510 Atherosclerotic heart disease of native coronary artery without angina pectoris: Secondary | ICD-10-CM | POA: Insufficient documentation

## 2016-12-04 NOTE — Telephone Encounter (Signed)
Sherrie Ronnald Ramp for [facility] called back requesting order for patient to discontinue Plavix. Per doctor Lynnae January she will review patient record and will get back in touch with Velora Heckler

## 2016-12-04 NOTE — Telephone Encounter (Signed)
I spoke with the patient's caregiver Aleta, she verbalized understanding to have the patient hold his Plavix on 12/13/16 and that he should remain on his ASA.

## 2016-12-04 NOTE — Progress Notes (Signed)
Per pt, no allergies to soy or egg products.Pt not taking any weight loss meds or using  O2 at home.  Pt refused Emmi video.  Pt and wife Kevin Soto) came into the office today for his pre-visit prior to his endoscopy on 12/18/16. Pt is on Plavix 75 mg daily and per wife, had stopped Plavix on 11/29/16 prior to his prostate biopsy on 12/06/17. Wife was not aware of how long he is to stay off his Plavix or ASA. Coag letter was faxed to Dr Marlowe Sax office for instructions on Plavix prior to his endoscopy scheduled for 12/18/16.  Advised pt's wife to call our office next week if she has not heard anything regarding Plavix instructions. She understood. No response today from Dr. Marlowe Sax office.

## 2016-12-04 NOTE — Telephone Encounter (Signed)
Hi Margaret,  I had already messaged GI back on 11/07/2016 stating it was okay to hold his plavix for 5 days. Chilon messaged me 4 days ago and we had sent another message stating it was okay. Please let me know if I need to talk to someone on the phone.   Thanks,  Vasu

## 2016-12-04 NOTE — Telephone Encounter (Signed)
I have reviewed chart inc PMHx, PL, Most recent cards note, most recent [facility] note. Had cath and PCI with DES placed in 2012. Notes in 2016 and 2017 indicated CAD "stable" or "no angina". For repeat colonoscopy. OK per this info, understanding I have not eval pt, for pt to come off plavix 5 days pre-colonoscopy. Will forward to Dr Marlowe Sax, PCP and eval pt most recently in [facility], for review.

## 2016-12-06 ENCOUNTER — Encounter: Payer: Self-pay | Admitting: Gastroenterology

## 2016-12-06 DIAGNOSIS — C61 Malignant neoplasm of prostate: Secondary | ICD-10-CM | POA: Diagnosis not present

## 2016-12-06 DIAGNOSIS — R972 Elevated prostate specific antigen [PSA]: Secondary | ICD-10-CM | POA: Diagnosis not present

## 2016-12-11 DIAGNOSIS — H3582 Retinal ischemia: Secondary | ICD-10-CM | POA: Diagnosis not present

## 2016-12-11 DIAGNOSIS — H2522 Age-related cataract, morgagnian type, left eye: Secondary | ICD-10-CM | POA: Diagnosis not present

## 2016-12-11 DIAGNOSIS — E113522 Type 2 diabetes mellitus with proliferative diabetic retinopathy with traction retinal detachment involving the macula, left eye: Secondary | ICD-10-CM | POA: Diagnosis not present

## 2016-12-11 DIAGNOSIS — E113591 Type 2 diabetes mellitus with proliferative diabetic retinopathy without macular edema, right eye: Secondary | ICD-10-CM | POA: Diagnosis not present

## 2016-12-18 ENCOUNTER — Encounter: Payer: Medicare Other | Admitting: Gastroenterology

## 2016-12-18 ENCOUNTER — Ambulatory Visit (AMBULATORY_SURGERY_CENTER): Payer: Medicare Other | Admitting: Gastroenterology

## 2016-12-18 ENCOUNTER — Encounter: Payer: Self-pay | Admitting: Gastroenterology

## 2016-12-18 VITALS — BP 111/84 | HR 65 | Temp 97.8°F | Resp 19 | Ht 72.0 in | Wt 204.0 lb

## 2016-12-18 DIAGNOSIS — R933 Abnormal findings on diagnostic imaging of other parts of digestive tract: Secondary | ICD-10-CM | POA: Diagnosis present

## 2016-12-18 DIAGNOSIS — K2970 Gastritis, unspecified, without bleeding: Secondary | ICD-10-CM

## 2016-12-18 DIAGNOSIS — K229 Disease of esophagus, unspecified: Secondary | ICD-10-CM

## 2016-12-18 DIAGNOSIS — K2950 Unspecified chronic gastritis without bleeding: Secondary | ICD-10-CM | POA: Diagnosis not present

## 2016-12-18 DIAGNOSIS — B9681 Helicobacter pylori [H. pylori] as the cause of diseases classified elsewhere: Secondary | ICD-10-CM | POA: Diagnosis not present

## 2016-12-18 DIAGNOSIS — R1013 Epigastric pain: Secondary | ICD-10-CM

## 2016-12-18 DIAGNOSIS — R634 Abnormal weight loss: Secondary | ICD-10-CM | POA: Diagnosis not present

## 2016-12-18 DIAGNOSIS — R935 Abnormal findings on diagnostic imaging of other abdominal regions, including retroperitoneum: Secondary | ICD-10-CM | POA: Diagnosis not present

## 2016-12-18 MED ORDER — PANTOPRAZOLE SODIUM 40 MG PO TBEC: 40.0000 mg | DELAYED_RELEASE_TABLET | Freq: Every day | ORAL | 1 refills | Status: DC

## 2016-12-18 MED ORDER — SODIUM CHLORIDE 0.9 % IV SOLN: 500.0000 mL | INTRAVENOUS | Status: AC

## 2016-12-18 MED ORDER — FLUCONAZOLE 100 MG PO TABS: 100.0000 mg | ORAL_TABLET | Freq: Every day | ORAL | 0 refills | Status: DC

## 2016-12-18 NOTE — Progress Notes (Signed)
Spontaneous respirations throughout. VSS. Resting comfortably. To PACU on room air. Report to  Penny RN.  

## 2016-12-18 NOTE — Op Note (Signed)
[facility] Patient Name: Kevin Soto Procedure Date: 12/18/2016 1:52 PM MRN: [id number] Endoscopist: Ladene Artist , MD Age: 56 Referring MD:  Date of Birth: 08/30/1960 Gender: Male Account #: [id number] Procedure:                Upper GI endoscopy Indications:              Epigastric abdominal pain, Abnormal CT of the GI                            tract (stomach), Weight loss Medicines:                Monitored Anesthesia Care Procedure:                Pre-Anesthesia Assessment:                           - Prior to the procedure, a History and Physical                            was performed, and patient medications and                            allergies were reviewed. The patient's tolerance of                            previous anesthesia was also reviewed. The risks                            and benefits of the procedure and the sedation                            options and risks were discussed with the patient.                            All questions were answered, and informed consent                            was obtained. Prior Anticoagulants: The patient has                            taken Plavix (clopidogrel), last dose was 5 days                            prior to procedure. ASA Grade Assessment: III - A                            patient with severe systemic disease. After                            reviewing the risks and benefits, the patient was                            deemed in satisfactory condition to undergo the  procedure.                           After obtaining informed consent, the endoscope was                            passed under direct vision. Throughout the                            procedure, the patient's blood pressure, pulse, and                            oxygen saturations were monitored continuously. The                            Model GIF-[id number]) scope was introduced                   through the mouth, and advanced to the second part                            of duodenum. The upper GI endoscopy was                            accomplished without difficulty. The patient                            tolerated the procedure well. Scope In: Scope Out: Findings:                 Localized candidiasis was found in the distal                            esophagus.                           The exam of the esophagus was otherwise normal.                           A few localized, small non-bleeding erosions were                            found in the gastric body and in the gastric                            antrum. There were no stigmata of recent bleeding.                            Biopsies were taken with a cold forceps for                            histology.                           The exam of the stomach was otherwise normal.  One non-bleeding superficial duodenal ulcer with no                            stigmata of bleeding was found in the duodenal                            bulb. The lesion was 8 mm in largest dimension.                           The second portion of the duodenum was normal. Complications:            No immediate complications. Estimated Blood Loss:     Estimated blood loss was minimal. Impression:               - Monilial esophagitis.                           - Non-bleeding erosive gastropathy. Biopsied.                           - One non-bleeding duodenal ulcer with no stigmata                            of bleeding.                           - Normal second portion of the duodenum. Recommendation:           - Patient has a contact number available for                            emergencies. The signs and symptoms of potential                            delayed complications were discussed with the                            patient. Return to normal activities tomorrow.                             Written discharge instructions were provided to the                            patient.                           - Resume previous diet.                           - Continue present medications.                           - No aspirin, ibuprofen, naproxen, or other                            non-steroidal anti-inflammatory drugs. ASA 81 mg  daily is OK.                           - Await pathology results.                           - Resume Plavix (clopidogrel) at prior dose                            tomorrow. Refer to managing physician for further                            adjustment of therapy.                           - Protonix (pantoprazole) 40 mg PO daily for 2                            months.                           - Return to GI office in 6 weeks with Dr. Ardis Hughs.                           - Diflucan (fluconazole) 100 mg PO daily for 7 days. Ladene Artist, MD 12/18/2016 2:17:40 PM This report has been signed electronically.

## 2016-12-18 NOTE — Patient Instructions (Signed)
New medications ordered ,sent to your pharmacy for pick up  NO ASPIRIN, ASPIRIN CONTAINING PRODUCTS (BC OR GOODY POWDERS) OR NSAIDS (IBUPROFEN, ADVIL, ALEVE, AND MOTRIN) FOR ; TYLENOL IS OK TO TAKE,baby aspirin ok to take.  Resume Plavix tomorrow  Make appointment to return to Dr Ardis Hughs for office appointment in 6 weeks   YOU HAD AN ENDOSCOPIC PROCEDURE TODAY AT [facility]:   Refer to the procedure report that was given to you for any specific questions about what was found during the examination.  If the procedure report does not answer your questions, please call your gastroenterologist to clarify.  If you requested that your care partner not be given the details of your procedure findings, then the procedure report has been included in a sealed envelope for you to review at your convenience later.  YOU SHOULD EXPECT: Some feelings of bloating in the abdomen. Passage of more gas than usual.  Walking can help get rid of the air that was put into your GI tract during the procedure and reduce the bloating. If you had a lower endoscopy (such as a colonoscopy or flexible sigmoidoscopy) you may notice spotting of blood in your stool or on the toilet paper. If you underwent a bowel prep for your procedure, you may not have a normal bowel movement for a few days.  Please Note:  You might notice some irritation and congestion in your nose or some drainage.  This is from the oxygen used during your procedure.  There is no need for concern and it should clear up in a day or so.  SYMPTOMS TO REPORT IMMEDIATELY:     Following upper endoscopy (EGD)  Vomiting of blood or coffee ground material  New chest pain or pain under the shoulder blades  Painful or persistently difficult swallowing  New shortness of breath  Fever of 100F or higher  Black, tarry-looking stools  For urgent or emergent issues, a gastroenterologist can be reached at any hour by calling [phone].   DIET:   We do recommend a small meal at first, but then you may proceed to your regular diet.  Drink plenty of fluids but you should avoid alcoholic beverages for 24 hours.  ACTIVITY:  You should plan to take it easy for the rest of today and you should NOT DRIVE or use heavy machinery until tomorrow (because of the sedation medicines used during the test).    FOLLOW UP: Our staff will call the number listed on your records the next business day following your procedure to check on you and address any questions or concerns that you may have regarding the information given to you following your procedure. If we do not reach you, we will leave a message.  However, if you are feeling well and you are not experiencing any problems, there is no need to return our call.  We will assume that you have returned to your regular daily activities without incident.  If any biopsies were taken you will be contacted by phone or by letter within the next 1-3 weeks.  Please call us at [phone] if you have not heard about the biopsies in 3 weeks.    SIGNATURES/CONFIDENTIALITY: You and/or your care partner have signed paperwork which will be entered into your electronic medical record.  These signatures attest to the fact that that the information above on your After Visit Summary has been reviewed and is understood.  Full responsibility of the confidentiality of this  discharge information lies with you and/or your care-partner. 

## 2016-12-18 NOTE — Progress Notes (Signed)
Called to room to assist during endoscopic procedure.  Patient ID and intended procedure confirmed with present staff. Received instructions for my participation in the procedure from the performing physician.  

## 2016-12-19 ENCOUNTER — Telehealth: Payer: Self-pay | Admitting: *Deleted

## 2016-12-19 NOTE — Telephone Encounter (Signed)
Message left

## 2016-12-21 ENCOUNTER — Telehealth: Payer: Self-pay | Admitting: Gastroenterology

## 2016-12-21 NOTE — Telephone Encounter (Signed)
Pt's wife called questioning which medication is patient supposed to take 30 mins before eating. She was told pantoprazole she stated she had figured it out right before I called back.

## 2016-12-28 ENCOUNTER — Telehealth: Payer: Self-pay | Admitting: Gastroenterology

## 2016-12-28 NOTE — Telephone Encounter (Signed)
Dr. Fuller Plan do you still want to prescribe?

## 2016-12-30 NOTE — Telephone Encounter (Signed)
Nystatin Oral suspension 600,000 units qid for 7 days, swish and swallow.  DC Diflucan.  This is Dr. Ardis Hughs' patient so further mgmt per Dr. Ardis Hughs.

## 2016-12-31 ENCOUNTER — Other Ambulatory Visit: Payer: Self-pay

## 2016-12-31 MED ORDER — PANTOPRAZOLE SODIUM 40 MG PO TBEC: 40.0000 mg | DELAYED_RELEASE_TABLET | Freq: Two times a day (BID) | ORAL | 0 refills | Status: DC

## 2016-12-31 MED ORDER — BIS SUBCIT-METRONID-TETRACYC 140-125-125 MG PO CAPS: 3.0000 | ORAL_CAPSULE | Freq: Three times a day (TID) | ORAL | 0 refills | Status: DC

## 2016-12-31 MED ORDER — NYSTATIN 100000 UNIT/ML MT SUSP: 6.0000 mL | Freq: Four times a day (QID) | OROMUCOSAL | 0 refills | Status: DC

## 2016-12-31 NOTE — Telephone Encounter (Signed)
Spoke with [facility] and informed them that we sent a new Rx and to cancel the Rx for Diflucan.

## 2016-12-31 NOTE — Telephone Encounter (Signed)
Prescription for nystatin sent to patient's pharmacy in place of the Diflucan.

## 2017-01-01 ENCOUNTER — Telehealth: Payer: Self-pay | Admitting: Gastroenterology

## 2017-01-01 MED ORDER — BIS SUBCIT-METRONID-TETRACYC 140-125-125 MG PO CAPS: 3.0000 | ORAL_CAPSULE | Freq: Three times a day (TID) | ORAL | 0 refills | Status: DC

## 2017-01-01 NOTE — Telephone Encounter (Signed)
Prescription resent to [facility] per patient's request.

## 2017-01-02 ENCOUNTER — Telehealth: Payer: Self-pay

## 2017-01-02 MED ORDER — BISMUTH SUBSALICYLATE 262 MG PO CHEW: CHEWABLE_TABLET | ORAL | 0 refills | Status: DC

## 2017-01-02 MED ORDER — METRONIDAZOLE 250 MG PO TABS: 250.0000 mg | ORAL_TABLET | Freq: Two times a day (BID) | ORAL | 0 refills | Status: AC

## 2017-01-02 MED ORDER — DOXYCYCLINE HYCLATE 100 MG PO TABS: 100.0000 mg | ORAL_TABLET | Freq: Two times a day (BID) | ORAL | 0 refills | Status: AC

## 2017-01-02 NOTE — Telephone Encounter (Signed)
Left a message for patient to return my call. 

## 2017-01-02 NOTE — Telephone Encounter (Signed)
Wife Kevin Soto returned phone call. Best # [phone]

## 2017-01-02 NOTE — Telephone Encounter (Signed)
Yes prescribe the individual medications included in Pylera for 14 days along with a PPI bid for 14 days.  Kevin Soto has the regimen we have used when Pylera was not covered.

## 2017-01-02 NOTE — Telephone Encounter (Signed)
Patient's wife states I can call her husband back now and he will answer. Called patient again and he did not answer.

## 2017-01-02 NOTE — Telephone Encounter (Signed)
Pt's wife returning call to [facility] best call back # [phone].

## 2017-01-02 NOTE — Telephone Encounter (Signed)
Returned wife's call and informed her that she is not on patient's DPR and I need to speak to him directly. Pt's wife said that was fine and gave me the number for her husband. Per wife called [phone] and patient did not answer and there was no voicemail to leave a message.

## 2017-01-02 NOTE — Telephone Encounter (Signed)
Received fax from pharmacy that Pylera is not covered by insurance plan. Contacted the rep for the company to try to get samples. He informed me samples are no longer available. Dr. Fuller Plan, can we switch patient to alternative: pepto-bismol, metronidazole, doxycycline and a PPI?

## 2017-01-03 NOTE — Telephone Encounter (Signed)
Attempted to contact patient again to inform him we sent in an alternative medication in place of the Pylera that was not covered by insurance. Patient did not answer and the voicemail has not been set up.

## 2017-01-04 NOTE — Telephone Encounter (Signed)
Informed patient that I sent a alternative to Pylera and to also take his Protonix twice daily while taking these medications x 14 days. Patient verbalized understanding.

## 2017-01-07 ENCOUNTER — Telehealth: Payer: Self-pay | Admitting: Gastroenterology

## 2017-01-07 NOTE — Telephone Encounter (Signed)
Patient's wife wanted to make sure it was ok to wait to pick up the medications to treat H. Pylori. She states they need to wait when they get paid again. They said it will be a few more days. Wife states the budget there money every month and do not have the 40 dollars to pick it up. Informed patient's wife that it would be ok to wait just a few more days. Patient's wife verbalized understanding.

## 2017-01-08 ENCOUNTER — Telehealth: Payer: Self-pay | Admitting: Gastroenterology

## 2017-01-08 NOTE — Telephone Encounter (Signed)
Informed patient's wife to take Nystatin four times a day swish and swallow x 7 days. She verbalized understanding.

## 2017-01-14 ENCOUNTER — Telehealth: Payer: Self-pay | Admitting: Gastroenterology

## 2017-01-14 NOTE — Telephone Encounter (Signed)
Instructed patient's wife to to have patient take Protonix twice daily x 14 days when he starts other combination medication to treat H. Pylori. Patient's wife verbalized understanding.

## 2017-01-22 DIAGNOSIS — E1129 Type 2 diabetes mellitus with other diabetic kidney complication: Secondary | ICD-10-CM | POA: Diagnosis not present

## 2017-01-22 DIAGNOSIS — D631 Anemia in chronic kidney disease: Secondary | ICD-10-CM | POA: Diagnosis not present

## 2017-01-22 DIAGNOSIS — I129 Hypertensive chronic kidney disease with stage 1 through stage 4 chronic kidney disease, or unspecified chronic kidney disease: Secondary | ICD-10-CM | POA: Diagnosis not present

## 2017-01-22 DIAGNOSIS — N2581 Secondary hyperparathyroidism of renal origin: Secondary | ICD-10-CM | POA: Diagnosis not present

## 2017-01-22 DIAGNOSIS — R809 Proteinuria, unspecified: Secondary | ICD-10-CM | POA: Diagnosis not present

## 2017-01-22 DIAGNOSIS — N183 Chronic kidney disease, stage 3 (moderate): Secondary | ICD-10-CM | POA: Diagnosis not present

## 2017-01-23 ENCOUNTER — Telehealth: Payer: Self-pay | Admitting: Gastroenterology

## 2017-01-23 MED ORDER — PANTOPRAZOLE SODIUM 40 MG PO TBEC: 40.0000 mg | DELAYED_RELEASE_TABLET | Freq: Two times a day (BID) | ORAL | 0 refills | Status: DC

## 2017-01-23 NOTE — Telephone Encounter (Signed)
Patient's wife states he only had 11 day supply of Protonix and needs 3 more days sent the pharmacy unless he can stop taking this early. Informed patient's wife that patient needs to finish the full treatment and take the 3 more days. Rx sent to patient's pharmacy.

## 2017-01-29 DIAGNOSIS — C61 Malignant neoplasm of prostate: Secondary | ICD-10-CM | POA: Diagnosis not present

## 2017-01-29 DIAGNOSIS — N184 Chronic kidney disease, stage 4 (severe): Secondary | ICD-10-CM | POA: Diagnosis not present

## 2017-01-31 ENCOUNTER — Encounter: Payer: Self-pay | Admitting: Radiation Oncology

## 2017-01-31 ENCOUNTER — Telehealth: Payer: Self-pay | Admitting: Gastroenterology

## 2017-01-31 MED ORDER — PANTOPRAZOLE SODIUM 40 MG PO TBEC: 40.0000 mg | DELAYED_RELEASE_TABLET | Freq: Two times a day (BID) | ORAL | 0 refills | Status: DC

## 2017-01-31 NOTE — Telephone Encounter (Signed)
Left a message for [name] that I sent a refill of Protonix to patient's pharmacy.

## 2017-02-07 ENCOUNTER — Encounter (HOSPITAL_COMMUNITY): Payer: Medicare Other

## 2017-02-11 ENCOUNTER — Encounter: Payer: Self-pay | Admitting: Radiation Oncology

## 2017-02-12 DIAGNOSIS — C61 Malignant neoplasm of prostate: Secondary | ICD-10-CM | POA: Diagnosis not present

## 2017-02-12 DIAGNOSIS — Z5111 Encounter for antineoplastic chemotherapy: Secondary | ICD-10-CM | POA: Diagnosis not present

## 2017-02-18 ENCOUNTER — Ambulatory Visit: Payer: Medicare Other | Admitting: Radiation Oncology

## 2017-02-18 ENCOUNTER — Ambulatory Visit: Payer: Medicare Other

## 2017-02-19 ENCOUNTER — Other Ambulatory Visit (HOSPITAL_COMMUNITY): Payer: Self-pay | Admitting: *Deleted

## 2017-02-19 ENCOUNTER — Ambulatory Visit (INDEPENDENT_AMBULATORY_CARE_PROVIDER_SITE_OTHER): Payer: Medicare Other | Admitting: Gastroenterology

## 2017-02-19 ENCOUNTER — Encounter: Payer: Self-pay | Admitting: Gastroenterology

## 2017-02-19 ENCOUNTER — Encounter (INDEPENDENT_AMBULATORY_CARE_PROVIDER_SITE_OTHER): Payer: Self-pay

## 2017-02-19 VITALS — BP 100/70 | HR 66 | Ht 72.0 in | Wt 202.0 lb

## 2017-02-19 DIAGNOSIS — A048 Other specified bacterial intestinal infections: Secondary | ICD-10-CM | POA: Diagnosis not present

## 2017-02-19 DIAGNOSIS — K269 Duodenal ulcer, unspecified as acute or chronic, without hemorrhage or perforation: Secondary | ICD-10-CM

## 2017-02-19 NOTE — Patient Instructions (Addendum)
H pylori stool antigen in one month to confirm eradication.  Normal BMI (Body Mass Index- based on height and weight) is between 19 and 25. Your BMI today is Body mass index is 27.4 kg/m. Marland Kitchen Please consider follow up  regarding your BMI with your Primary Care Provider.

## 2017-02-19 NOTE — Progress Notes (Signed)
Review of pertinent gastrointestinal problems: 1. Duodenal ulcer, EGD Dr. Fuller Plan 11/2016 (done for abnormal stomach on CT, abd pain, weight loss).  H. Pylori +.  Treated with pylera type abx and PPI BID.  Also noted mild gastritis and monilia esophagitis (treated with swish/swallow).  2. Routine risk for colon cancer: Colonoscopy 06/2012 Dr. Ardis Hughs for screning was normal.  HPI: This is a very pleasant 56 year old man whom I last saw many years ago. He was recently here in our office and saw Amy for abdominal pain. CT scan suggested an abnormal stomach. Follow-up EGD with Dr. Fuller Plan, inadvertent scheduling error, showed H. pylori positive duodenal ulcer with mild gastritis. Also monilial esophagitis. Both were treated appropriately.  Today he feels well. Is here with his wife. They're most concerned about his recent diagnosis of prostate cancer.  Chief complaint is duodenal ulcer, H. pylori positive  He completed h. Pylori antibiotics 2-3 weeks ago.  He feels well overall.  Recently diagnosed with prostate cancer.  They believe he will be treated with radiation, oral and Injection meds.  ROS: complete GI ROS as described in HPI, all other review negative.  Constitutional:  No unintentional weight loss  Weight has stablized   Past Medical History:  Diagnosis Date  . Abdominal pain    lower abd  . Abnormal abdominal CT scan    wall thickening  . Anemia   . CAD (coronary artery disease)    cath 8/12:  LM ok, mLAD occluded with trivial collats from right AM and dRCA, mDx (small) 80%, RI 25%, tiny branch of OM 90%, mAM 30-40%, RCA sub-totally occluded, then 80%, EF 40%, inf HK.  He underwent PCI with [name] with placement of a Promus DES to the mRCA x 2  . Diabetes mellitus   . Dyslipidemia   . Gait instability 02/27/2011  . History of alcohol abuse   . History of stroke    evidence of CVA on MRI in past  . Hyperlipidemia   . Hypertension   . Ischemic cardiomyopathy    echo 7/12:  EF 40-45%, septal, apical, inf basal HK, mod LVH, mild MR, mild LAE.  EF 55% by echo 2015  . Macular hemorrhage   . Prostate cancer ([place])   . PSA elevation   . Sleep apnea    no c-pap  . Stroke [facility]) 2012   uses cane to steady due to dizziness  . Vision, loss, sudden 02/27/2011  . Weight loss     Past Surgical History:  Procedure Laterality Date  . CORONARY ANGIOPLASTY WITH STENT PLACEMENT  2012   2 stents  . REFRACTIVE SURGERY  2012   right and left eye/ per pt only left eye    Current Outpatient Prescriptions  Medication Sig Dispense Refill  . ACCU-CHEK FASTCLIX LANCETS MISC Use to check blood sugar two times a day 102 each 12  . aspirin EC 81 MG tablet Take 1 tablet (81 mg total) by mouth daily.    Marland Kitchen atorvastatin (LIPITOR) 40 MG tablet Take 1 tablet (40 mg total) by mouth daily. 30 tablet 5  . bismuth subsalicylate (PEPTO-BISMOL) 262 MG chewable tablet Take 2 tablets by mouth twice daily x 14 days 56 tablet 0  . Blood Glucose Monitoring Suppl (ACCU-CHEK GUIDE) w/Device KIT 1 each by Does not apply route 2 (two) times daily. 1 kit 0  . carvedilol (COREG) 25 MG tablet Take 25 mg by mouth 2 (two) times daily with a meal.    . clopidogrel (  PLAVIX) 75 MG tablet TAKE 1 TABLET BY MOUTH  DAILY 90 tablet 4  . diclofenac sodium (VOLTAREN) 1 % GEL Apply 4 g topically 4 (four) times daily. 1 Tube 2  . glipiZIDE (GLUCOTROL) 10 MG tablet Take 1 tablet (10 mg total) by mouth 2 (two) times daily before a meal. 180 tablet 1  . glucose blood (ACCU-CHEK GUIDE) test strip Use as instructed 100 each 12  . hydrochlorothiazide (HYDRODIURIL) 25 MG tablet Take 25 mg by mouth daily.    . nitroGLYCERIN (NITROSTAT) 0.4 MG SL tablet Place 1 tablet (0.4 mg total) under the tongue every 5 (five) minutes as needed. 100 tablet 1  . quinapril (ACCUPRIL) 10 MG tablet Take 10 mg by mouth daily.    Marland Kitchen spironolactone (ALDACTONE) 50 MG tablet Take 50 mg by mouth daily.    . tadalafil (CIALIS) 10 MG tablet Take 1  tablet (10 mg total) by mouth daily as needed for erectile dysfunction. 4 tablet 0   Current Facility-Administered Medications  Medication Dose Route Frequency Provider Last Rate Last Dose  . 0.9 %  sodium chloride infusion  500 mL Intravenous Continuous Ladene Artist, MD        Allergies as of 02/19/2017 - Review Complete 12/18/2016  Allergen Reaction Noted  . Clonidine derivatives  12/15/2010    Family History  Problem Relation Age of Onset  . Adopted: Yes  . Heart attack Mother        Died 61  . Heart failure Mother   . Diabetes Mother   . Heart attack Father   . Heart failure Father   . Diabetes Father   . Prostate cancer Father   . Heart disease Sister        s/p CABG x3 in her mid 10's  . Crohn's disease Sister   . Crohn's disease Other        niece  . Kidney disease Brother     Social History   Social History  . Marital status: Single    Spouse name: Aleta  . Number of children: 0  . Years of education: N/A   Occupational History  . disabled    Social History Main Topics  . Smoking status: Never Smoker  . Smokeless tobacco: Never Used  . Alcohol use 2.4 oz/week    2 Cans of beer, 2 Shots of liquor per week     Comment: Rarely.  . Drug use: No  . Sexual activity: Not on file   Other Topics Concern  . Not on file   Social History Narrative   Lives in [place] with his cousin   No children   Exercises 3 times a week     Physical Exam: BP 100/70   Pulse 66   Ht 6' (1.829 m)   Wt 202 lb (91.6 kg)   BMI 27.40 kg/m  Constitutional: generally well-appearing Psychiatric: alert and oriented x3 Abdomen: soft, nontender, nondistended, no obvious ascites, no peritoneal signs, normal bowel sounds No peripheral edema noted in lower extremities  Assessment and plan: 56 y.o. male with H. pylori positive gastritis, duodenal ulcer  He completed appropriate antibiotics. He is feeling well overall. I do wonder if some of his recent weight loss was  from his recently diagnosed prostate cancer. Certainly the H. pylori positive gastritis, duodenal ulcer could be solely responsible for the weight loss. He is feeling well now. We will plan on stool testing for H. pylori antigen in about one month to confirm eradication.  I see no reason for any further blood tests or imaging studies at this point.    Please see the "Patient Instructions" section for addition details about the plan.  Owens Loffler, MD [facility] 02/19/2017, 9:55 AM

## 2017-02-20 ENCOUNTER — Ambulatory Visit (INDEPENDENT_AMBULATORY_CARE_PROVIDER_SITE_OTHER): Payer: Medicare Other | Admitting: Podiatry

## 2017-02-20 ENCOUNTER — Encounter: Payer: Self-pay | Admitting: Podiatry

## 2017-02-20 ENCOUNTER — Ambulatory Visit (HOSPITAL_COMMUNITY)
Admission: RE | Admit: 2017-02-20 | Discharge: 2017-02-20 | Disposition: A | Discharge status: Home / Self Care | Payer: Medicare Other | Source: Ambulatory Visit | Attending: Nephrology | Admitting: Nephrology

## 2017-02-20 DIAGNOSIS — D631 Anemia in chronic kidney disease: Secondary | ICD-10-CM | POA: Diagnosis not present

## 2017-02-20 DIAGNOSIS — M79676 Pain in unspecified toe(s): Secondary | ICD-10-CM

## 2017-02-20 DIAGNOSIS — B351 Tinea unguium: Secondary | ICD-10-CM

## 2017-02-20 DIAGNOSIS — E119 Type 2 diabetes mellitus without complications: Secondary | ICD-10-CM

## 2017-02-20 DIAGNOSIS — N189 Chronic kidney disease, unspecified: Secondary | ICD-10-CM | POA: Diagnosis not present

## 2017-02-20 MED ORDER — SODIUM CHLORIDE 0.9 % IV SOLN
510.0000 mg | INTRAVENOUS | Status: DC
  Administered 2017-02-20: 510 mg via INTRAVENOUS
  Filled 2017-02-20: qty 17

## 2017-02-20 NOTE — Discharge Instructions (Signed)

## 2017-02-20 NOTE — Progress Notes (Signed)
Complaint:  Visit Type: Patient returns to my office for continued preventative foot care services. Complaint: Patient states" my nails have grown long and thick and become painful to walk and wear shoes" Patient has been diagnosed with DM with no foot complications. The patient presents for preventative foot care services. No changes to ROS  Podiatric Exam: Vascular: dorsalis pedis and posterior tibial pulses are palpable bilateral. Capillary return is immediate. Temperature gradient is WNL. Skin turgor WNL  Sensorium: Normal Semmes Weinstein monofilament test. Normal tactile sensation bilaterally. Nail Exam: Pt has thick disfigured discolored nails with subungual debris noted bilateral entire nail hallux through fifth toenails Ulcer Exam: There is no evidence of ulcer or pre-ulcerative changes or infection. Orthopedic Exam: Muscle tone and strength are WNL. No limitations in general ROM. No crepitus or effusions noted. Foot type and digits show no abnormalities. HAV  B/L. Skin: No Porokeratosis. No infection or ulcers  Diagnosis:  Onychomycosis, , Pain in right toe, pain in left toes  Diabetes  Treatment & Plan Procedures and Treatment: Consent by patient was obtained for treatment procedures.   Debridement of mycotic and hypertrophic toenails, 1 through 5 bilateral and clearing of subungual debris. No ulceration, no infection noted.  Return Visit-Office Procedure: Patient instructed to return to the office for a follow up visit 3 months for continued evaluation and treatment.    Gardiner Barefoot DPM

## 2017-02-25 NOTE — Progress Notes (Signed)
Radiation Oncology         [phone] ________________________________  Initial outpatient Consultation  Name: Kevin Soto MRN: [id number]  Date: 02/27/2017  DOB: 1960/05/29  [id number], Wandra Feinstein, MD  Alexis Frock, MD   REFERRING PHYSICIAN: Alexis Frock, MD  DIAGNOSIS: 56 year-old gentleman with Stage T2c adenocarcinoma of the prostate with Gleason Score of 4+4, and PSA of 43.      ICD-10-CM   1. Malignant neoplasm of prostate ([address]) C61     HISTORY OF PRESENT ILLNESS: Kevin Soto is a 56 y.o. male with a diagnosis of prostate cancer. He was an established patient of Dr. [name], followed for h/o elevated PSA since 2009. He recently transferred to Dr. Tresa Moore when Dr. [name]. He was noted to have an elevated PSA of 43 in May 2018 on further evaluation of a questionable L4 lytic bone lesion by plain film obtained for evaluation of back pain and weight loss.  Accordingly, he was referred for evaluation in urology by Dr. Tresa Moore on 11/12/16,  digital rectal examination was performed at that time revealing and estimated 25 g prostate  and diffuse firmness without large nodules.  The patient proceeded to transrectal ultrasound with 12 biopsies of the prostate on 12/05/16.  The prostate volume measured 33 cc.  Out of 12 core biopsies, 12 were positive.  The maximum Gleason score was 4+4, and this was seen in right mid, right base lateral, right mid lateral, and right apex lateral. Gleason score 4+3 was seen in left mid, left apex, and right apex. Gleason score 3+4 was seen in right base, left base, and left mid lateral. Gleason score 3+3 was seen in left base lateral and left apex lateral.  CT Abdomen/Pelvis was performed on 10/30/16 to investigate chronic generalized abdominal discomfort and 30 lbs weight loss. This scan showed no definite acute abnormality or adenopathy within the abdomen or pelvis.  There was a question of mild wall thickening at the antrum of the  stomach.   MRI spine was performed on 09/27/16 for further evaluation of questionable L4 lytic bone lesion by plain film and revealed degenerative changes only, no evidence of lytic lesions.     He has not had a bone scan to date to our knowledge based on records reviewed from [facility].   The patient was started on ADT with Biclutamide on 01/29/17 and then received Lupron 27m around 02/12/17.  Of note, the patient has a family history of prostate cancer in his father who died at the age of 58 He denies any family history of breast cancer or GI cancers.    PMH is significant for stage IV CRI, DM, asthma and TIA/mini-stroke.  The patient reviewed the biopsy results with his urologist and he has kindly been referred today for discussion of potential radiation treatment options.     PREVIOUS RADIATION THERAPY: No  PAST MEDICAL HISTORY:  Past Medical History:  Diagnosis Date  . Abdominal pain    lower abd  . Abnormal abdominal CT scan    wall thickening  . Anemia   . CAD (coronary artery disease)    cath 8/12:  LM ok, mLAD occluded with trivial collats from right AM and dRCA, mDx (small) 80%, RI 25%, tiny branch of OM 90%, mAM 30-40%, RCA sub-totally occluded, then 80%, EF 40%, inf HK.  He underwent PCI with Dr. MAngelena Formwith placement of a Promus DES to the mRCA x 2  . Diabetes mellitus   . Dyslipidemia   .  Gait instability 02/27/2011  . History of alcohol abuse   . History of stroke    evidence of CVA on MRI in past  . Hyperlipidemia   . Hypertension   . Ischemic cardiomyopathy    echo 7/12: EF 40-45%, septal, apical, inf basal HK, mod LVH, mild MR, mild LAE.  EF 55% by echo 2015  . Macular hemorrhage   . Prostate cancer ([place])   . PSA elevation   . Sleep apnea    no c-pap  . Stroke [facility]) 2012   uses cane to steady due to dizziness  . Vision, loss, sudden 02/27/2011  . Weight loss       PAST SURGICAL HISTORY: Past Surgical History:  Procedure Laterality Date  .  CORONARY ANGIOPLASTY WITH STENT PLACEMENT  2012   2 stents  . REFRACTIVE SURGERY  2012   right and left eye/ per pt only left eye    FAMILY HISTORY:  Family History  Problem Relation Age of Onset  . Adopted: Yes  . Heart attack Mother        Died 61  . Heart failure Mother   . Diabetes Mother   . Heart attack Father   . Heart failure Father   . Diabetes Father   . Prostate cancer Father   . Cancer Father        Prostate  . Heart disease Sister        s/p CABG x3 in her mid 52's  . Crohn's disease Sister   . Crohn's disease Other        niece  . Kidney disease Brother     SOCIAL HISTORY:  Social History   Social History  . Marital status: Single    Spouse name: Aleta  . Number of children: 0  . Years of education: N/A   Occupational History  . disabled    Social History Main Topics  . Smoking status: Never Smoker  . Smokeless tobacco: Never Used  . Alcohol use 2.4 oz/week    2 Cans of beer, 2 Shots of liquor per week     Comment: Rarely.  . Drug use: No  . Sexual activity: No   Other Topics Concern  . Not on file   Social History Narrative   Lives in [place] with his cousin   No children   Exercises 3 times a week    ALLERGIES: Clonidine derivatives  MEDICATIONS:  Current Outpatient Prescriptions  Medication Sig Dispense Refill  . ACCU-CHEK FASTCLIX LANCETS MISC Use to check blood sugar two times a day 102 each 12  . aspirin EC 81 MG tablet Take 1 tablet (81 mg total) by mouth daily.    Marland Kitchen atorvastatin (LIPITOR) 40 MG tablet Take 1 tablet (40 mg total) by mouth daily. 30 tablet 5  . bismuth subsalicylate (PEPTO-BISMOL) 262 MG chewable tablet Take 2 tablets by mouth twice daily x 14 days 56 tablet 0  . Blood Glucose Monitoring Suppl (ACCU-CHEK GUIDE) w/Device KIT 1 each by Does not apply route 2 (two) times daily. 1 kit 0  . carvedilol (COREG) 25 MG tablet Take 25 mg by mouth 2 (two) times daily with a meal.    . clopidogrel (PLAVIX) 75 MG tablet  TAKE 1 TABLET BY MOUTH  DAILY 90 tablet 4  . diclofenac sodium (VOLTAREN) 1 % GEL Apply 4 g topically 4 (four) times daily. 1 Tube 2  . glipiZIDE (GLUCOTROL) 10 MG tablet Take 1 tablet (10 mg total) by mouth  2 (two) times daily before a meal. 180 tablet 1  . glucose blood (ACCU-CHEK GUIDE) test strip Use as instructed 100 each 12  . hydrochlorothiazide (HYDRODIURIL) 25 MG tablet Take 25 mg by mouth daily.    . nitroGLYCERIN (NITROSTAT) 0.4 MG SL tablet Place 1 tablet (0.4 mg total) under the tongue every 5 (five) minutes as needed. 100 tablet 1  . quinapril (ACCUPRIL) 10 MG tablet Take 10 mg by mouth daily.    Marland Kitchen spironolactone (ALDACTONE) 50 MG tablet Take 50 mg by mouth daily.    . tadalafil (CIALIS) 10 MG tablet Take 1 tablet (10 mg total) by mouth daily as needed for erectile dysfunction. 4 tablet 0   Current Facility-Administered Medications  Medication Dose Route Frequency Provider Last Rate Last Dose  . 0.9 %  sodium chloride infusion  500 mL Intravenous Continuous Ladene Artist, MD        REVIEW OF SYSTEMS:  On review of systems, the patient reports that he is doing well overall. He is accompanied by his wife today. He denies any chest pain, shortness of breath, cough, fevers, chills, night sweats. He reports weight changes, stating he weighed 230 lbs in October 2017 and today patient weighed 200.2 lbs. His weight has been stable recently. He denies any bowel disturbances, and denies abdominal pain, nausea or vomiting. He denies any new musculoskeletal or joint aches or pains. He denies trouble with swelling of the feet or ankles. He is currently taking Casodex and received Lupron 45 at the end of September. Denies fatigue, hot flashes, or mood swings since ADT injection. His IPSS was 5, indicating mild urinary symptoms. Reports frequency, ED, and urinary leakage at night. He denies dysuria or hematuria. He is not able to complete sexual activity. A complete review of systems is obtained and  is otherwise negative.    PHYSICAL EXAM:  Wt Readings from Last 3 Encounters:  02/27/17 200 lb 3.2 oz (90.8 kg)  02/20/17 200 lb (90.7 kg)  02/19/17 202 lb (91.6 kg)   Temp Readings from Last 3 Encounters:  02/27/17 98 F (36.7 C) (Oral)  02/20/17 98.6 F (37 C) (Oral)  12/18/16 97.8 F (36.6 C)   BP Readings from Last 3 Encounters:  02/27/17 126/73  02/20/17 127/66  02/19/17 100/70   Pulse Readings from Last 3 Encounters:  02/27/17 70  02/20/17 (!) 56  02/19/17 66   Pain Assessment Pain Score: 0-No pain/10  In general this is a well appearing african-american male in no acute distress. He is alert and oriented x4 and appropriate throughout the examination. HEENT reveals that the patient is normocephalic, atraumatic. EOMs are intact. PERRLA. Skin is intact without any evidence of gross lesions. Cardiovascular exam reveals a regular rate and rhythm, no clicks rubs or murmurs are auscultated. Chest is clear to auscultation bilaterally. Lymphatic assessment is performed and does not reveal any adenopathy in the cervical, supraclavicular, axillary, or inguinal chains. Abdomen has active bowel sounds in all quadrants and is intact. The abdomen is soft, non tender, non distended. Lower extremities are negative for pretibial pitting edema, deep calf tenderness, cyanosis or clubbing.   KPS = 90  100 - Normal; no complaints; no evidence of disease. 90   - Able to carry on normal activity; minor signs or symptoms of disease. 80   - Normal activity with effort; some signs or symptoms of disease. 12   - Cares for self; unable to carry on normal activity or to do active work.  60   - Requires occasional assistance, but is able to care for most of his personal needs. 50   - Requires considerable assistance and frequent medical care. 15   - Disabled; requires special care and assistance. 46   - Severely disabled; hospital admission is indicated although death not imminent. 19   - Very  sick; hospital admission necessary; active supportive treatment necessary. 10   - Moribund; fatal processes progressing rapidly. 0     - Dead  Karnofsky DA, Abelmann [place], Craver LS and Burchenal [facility] [phone]) The use of the nitrogen mustards in the palliative treatment of carcinoma: with particular reference to bronchogenic carcinoma Cancer 1 634-56  LABORATORY DATA:  Lab Results  Component Value Date   WBC 11.2 (H) 10/19/2016   HGB 10.5 (L) 10/19/2016   HCT 32.1 (L) 10/19/2016   MCV 85.1 10/19/2016   PLT 300 10/19/2016   Lab Results  Component Value Date   NA 134 (L) 10/19/2016   K 4.5 10/19/2016   CL 101 10/19/2016   CO2 25 10/19/2016   Lab Results  Component Value Date   ALT 14 (L) 10/19/2016   AST 16 10/19/2016   ALKPHOS 67 10/19/2016   BILITOT 0.7 10/19/2016     RADIOGRAPHY: No results found.    IMPRESSION/PLAN: 1. 56 y.o. gentleman with Stage T2c adenocarcinoma of the prostate with Gleason Score of 4+4, and PSA of 43. Today we reviewed the findings and workup thus far.  We discussed the natural history of prostate cancer.  We reviewed the the implications of T-stage, Gleason's Score, and PSA on decision-making and outcomes in prostate cancer.  His Gleason score and PSA place him in the high risk category.  We discussed radiation treatment in the management of high prostate cancer with regard to the logistics and delivery of external beam radiation treatment in combination with LT-ADTas well as the logistics and delivery of prostate brachytherapy in combination with LT-ADT. We also discussed the logistics and delivery of 5 weeks external beam radiation with a prostate seed boost in addition to LT-ADT as the most aggressive form of radiotherapy. We discussed the use of SpaceOAR to reduce the potential rectal toxicities associated with prostate radiotherapy.  We compared and contrasted each of these approaches and also compared these against prostatectomy. The patient is not an ideal  surgical candidate based on his multiple medical co-morbidities.   At the conclusion of our conversation, the patient elects to proceed with prostate brachytherapy and 5 weeks external beam radiation in addition to LT-ADT.  We will order a bone scan to be performed in the near future to complete his disease staging and r/o osseous metastatic disease.   We will share our findings with Dr. Tresa Moore and move forward with scheduling the patient for brachytherapy with SpaceOAR insertion and placement of three gold fiducial markers into the prostate in the near future. We will see him back in the office approximately 3 weeks after his procedure and will proceed with CT Simulation for planning for 5 weeks of prostate IMRT to begin shortly thereafter.     Nicholos Johns, PA-C    Tyler Pita, MD  [facility] Direct Dial: [phone]  Fax: [phone] [email or web address]  Skype  [email or web address]    Page Me    This document serves as a record of services personally performed by Tyler Pita, MD and Freeman Caldron, PA-C. It was created on their behalf by Arlyce Harman, a trained medical scribe. The creation of  this record is based on the scribe's personal observations and the provider's statements to them. This document has been checked and approved by the attending provider.

## 2017-02-26 ENCOUNTER — Encounter: Payer: Self-pay | Admitting: Radiation Oncology

## 2017-02-26 ENCOUNTER — Other Ambulatory Visit (HOSPITAL_COMMUNITY): Payer: Self-pay | Admitting: *Deleted

## 2017-02-26 NOTE — Progress Notes (Signed)
GU Location of Tumor / Histology: high risk prostate cancer  If Prostate Cancer, Gleason Score is (4 + 4) and PSA is (43). Prostate volume: 33 cc.  Kevin Soto was an established patient of Dr. Arlyn Leak since 2009. Patient transferred to Dr. Tresa Moore when [name].   Biopsies of prostate (if applicable) revealed:    Past/Anticipated interventions by urology, if any: Biopsy, referral to Dr. Tammi Klippel to discuss primary radiation, [name] plans to give 2 years of ADT, administered Lupron 45 end of September  Past/Anticipated interventions by medical oncology, if any: no  Weight changes, if any: Yes, in October 2018 he weighed 230. Today patient weighed 200.2.  Bowel/Bladder complaints, if any: IPSS 5. Reports frequency, ED. Denies dysuria or hematuria. Reports urinary leakage at night. Denies fatigue, hot flashes or mood swings since ADT injection   Nausea/Vomiting, if any: no  Pain issues, if any:  Denies pain.   SAFETY ISSUES:  Prior radiation? no  Pacemaker/ICD? no  Possible current pregnancy? no  Is the patient on methotrexate? no  Current Complaints / other details:  56 year old male.Father with history of prostate cancer. Denies hx of breast cancer among family.

## 2017-02-27 ENCOUNTER — Encounter: Payer: Self-pay | Admitting: Radiation Oncology

## 2017-02-27 ENCOUNTER — Ambulatory Visit
Admission: RE | Admit: 2017-02-27 | Discharge: 2017-02-27 | Disposition: A | Discharge status: Home / Self Care | Payer: Medicare Other | Source: Ambulatory Visit | Attending: Radiation Oncology | Admitting: Radiation Oncology

## 2017-02-27 ENCOUNTER — Ambulatory Visit (HOSPITAL_COMMUNITY): Admission: RE | Admit: 2017-02-27 | Payer: Medicare Other | Source: Ambulatory Visit

## 2017-02-27 VITALS — BP 126/73 | HR 70 | Temp 98.0°F | Resp 18 | Ht 72.0 in | Wt 200.2 lb

## 2017-02-27 DIAGNOSIS — Z9289 Personal history of other medical treatment: Secondary | ICD-10-CM | POA: Diagnosis not present

## 2017-02-27 DIAGNOSIS — R972 Elevated prostate specific antigen [PSA]: Secondary | ICD-10-CM | POA: Diagnosis not present

## 2017-02-27 DIAGNOSIS — Z8042 Family history of malignant neoplasm of prostate: Secondary | ICD-10-CM | POA: Diagnosis not present

## 2017-02-27 DIAGNOSIS — Z510 Encounter for antineoplastic radiation therapy: Secondary | ICD-10-CM | POA: Diagnosis not present

## 2017-02-27 DIAGNOSIS — C61 Malignant neoplasm of prostate: Secondary | ICD-10-CM | POA: Insufficient documentation

## 2017-02-27 DIAGNOSIS — Z79818 Long term (current) use of other agents affecting estrogen receptors and estrogen levels: Secondary | ICD-10-CM | POA: Diagnosis not present

## 2017-02-27 NOTE — Progress Notes (Signed)
See progress note under physician encounter. 

## 2017-02-28 ENCOUNTER — Ambulatory Visit (HOSPITAL_COMMUNITY)
Admission: RE | Admit: 2017-02-28 | Discharge: 2017-02-28 | Disposition: A | Discharge status: Home / Self Care | Payer: Medicare Other | Source: Ambulatory Visit | Attending: Nephrology | Admitting: Nephrology

## 2017-02-28 ENCOUNTER — Telehealth: Payer: Self-pay | Admitting: *Deleted

## 2017-02-28 DIAGNOSIS — D631 Anemia in chronic kidney disease: Secondary | ICD-10-CM | POA: Diagnosis not present

## 2017-02-28 MED ORDER — SODIUM CHLORIDE 0.9 % IV SOLN
510.0000 mg | INTRAVENOUS | Status: DC
  Administered 2017-02-28: 13:00:00 510 mg via INTRAVENOUS
  Filled 2017-02-28: qty 17

## 2017-02-28 NOTE — Telephone Encounter (Signed)
CALLED PATIENT TO INFORM OF BONE SCAN ON 03-08-17 - ARRIVAL TIME - 7:45 AM @ [facility] AND PATIENT TO RETURN @ 11 AM FOR SCAN, LVM FOR A RETURN CALL

## 2017-03-07 ENCOUNTER — Other Ambulatory Visit: Payer: Self-pay

## 2017-03-08 ENCOUNTER — Ambulatory Visit (HOSPITAL_COMMUNITY): Payer: Medicare Other

## 2017-03-08 ENCOUNTER — Encounter (HOSPITAL_COMMUNITY): Payer: Medicare Other

## 2017-03-11 DIAGNOSIS — C61 Malignant neoplasm of prostate: Secondary | ICD-10-CM | POA: Insufficient documentation

## 2017-03-19 ENCOUNTER — Telehealth: Payer: Self-pay | Admitting: Medical Oncology

## 2017-03-19 NOTE — Telephone Encounter (Signed)
I left a message with Kevin Soto to introduce myself as the prostate nurse navigator. I was unable to meet him the day he consulted with Dr. Tammi Klippel. I asked him to return my call.

## 2017-03-27 ENCOUNTER — Other Ambulatory Visit: Payer: Self-pay | Admitting: Urology

## 2017-03-27 DIAGNOSIS — C61 Malignant neoplasm of prostate: Secondary | ICD-10-CM

## 2017-03-28 ENCOUNTER — Telehealth: Payer: Self-pay | Admitting: *Deleted

## 2017-03-28 ENCOUNTER — Other Ambulatory Visit: Payer: Self-pay | Admitting: Urology

## 2017-03-28 NOTE — Telephone Encounter (Signed)
CALLED PATIENT TO INFORM OF PRE SEED PLANNING CT FOR 03-29-17 @ 3:30 PM, SPOKE WITH PATIENT'S WIFE AND SHE IS AWARE OF THESE APPTS.

## 2017-03-29 ENCOUNTER — Ambulatory Visit
Admission: RE | Admit: 2017-03-29 | Discharge: 2017-03-29 | Disposition: A | Discharge status: Home / Self Care | Payer: Medicare Other | Source: Ambulatory Visit | Attending: Radiation Oncology | Admitting: Radiation Oncology

## 2017-03-29 ENCOUNTER — Encounter: Payer: Self-pay | Admitting: Medical Oncology

## 2017-03-29 ENCOUNTER — Ambulatory Visit (HOSPITAL_COMMUNITY)
Admission: RE | Admit: 2017-03-29 | Discharge: 2017-03-29 | Disposition: A | Discharge status: Home / Self Care | Payer: Medicare Other | Source: Ambulatory Visit | Attending: Urology | Admitting: Urology

## 2017-03-29 ENCOUNTER — Encounter (HOSPITAL_COMMUNITY)
Admission: RE | Admit: 2017-03-29 | Discharge: 2017-03-29 | Disposition: A | Discharge status: Home / Self Care | Payer: Medicare Other | Source: Ambulatory Visit | Attending: Urology | Admitting: Urology

## 2017-03-29 DIAGNOSIS — C61 Malignant neoplasm of prostate: Secondary | ICD-10-CM | POA: Diagnosis not present

## 2017-03-29 DIAGNOSIS — Z510 Encounter for antineoplastic radiation therapy: Secondary | ICD-10-CM | POA: Diagnosis not present

## 2017-03-29 MED ORDER — TECHNETIUM TC 99M MEDRONATE IV KIT
20.8000 | PACK | Freq: Once | INTRAVENOUS | Status: AC | PRN
  Administered 2017-03-29: 20.8 via INTRAVENOUS

## 2017-03-29 NOTE — Progress Notes (Signed)
  Radiation Oncology         [phone] ________________________________  Name: Kevin Soto MRN: [id number]  Date: 03/29/2017  DOB: 01/19/1961  SIMULATION AND TREATMENT PLANNING NOTE PUBIC ARCH STUDY  EK:BTCYELY, Wandra Feinstein, MD  Alexis Frock, MD  DIAGNOSIS: Stage T2c adenocarcinoma of the prostate with Gleason Score of 4+4, and PSA of 43     ICD-10-CM   1. Malignant neoplasm of prostate ([place]) C61    COMPLEX SIMULATION:  The patient presented today for evaluation for possible prostate seed implant. He was brought to the radiation planning suite and placed supine on the CT couch. A 3-dimensional image study set was obtained in upload to the planning computer. There, on each axial slice, I contoured the prostate gland. Then, using three-dimensional radiation planning tools I reconstructed the prostate in view of the structures from the transperineal needle pathway to assess for possible pubic arch interference. In doing so, I did not appreciate any pubic arch interference. Also, the patient's prostate volume was estimated based on the drawn structure. The volume was 26 cc.  Given the pubic arch appearance and prostate volume, patient remains a good candidate to proceed with prostate seed implant. Today, he freely provided informed written consent to proceed.    PLAN: The patient will undergo prostate seed boost followed by EBRT   ________________________________  Sheral Apley. Tammi Klippel, M.D.   This document serves as a record of services personally performed by Tyler Pita, MD. It was created on his behalf by Margit Banda, a trained medical scribe. The creation of this record is based on the scribe's personal observations and the provider's statements to them. This document has been checked and approved by the attending provider.

## 2017-03-29 NOTE — Progress Notes (Signed)
Met [name] and introduced myself to as nurse navigator and my role. I was unable to meet him the day he consults with Dr. Tammi Klippel. He is scheduled for seed implant 05/08/17. I asked him to call with questions or concerns.

## 2017-04-01 ENCOUNTER — Other Ambulatory Visit (HOSPITAL_COMMUNITY): Payer: Medicare Other

## 2017-04-01 ENCOUNTER — Encounter (HOSPITAL_COMMUNITY): Payer: Medicare Other

## 2017-04-02 ENCOUNTER — Telehealth: Payer: Self-pay | Admitting: Radiation Oncology

## 2017-04-02 NOTE — Telephone Encounter (Signed)
Phoned patient as requested by Freeman Caldron, PA-C. Aleta answered and reported the patient was at the gym. Explained to Aleta his bone scan was negative for metastatic disease and the plan is to proceed with seed implant on 05/08/17 followed by IMRT. She verbalized understanding, appreciation for the call and commented to inform the patient.

## 2017-04-04 ENCOUNTER — Encounter (HOSPITAL_COMMUNITY)
Admission: RE | Admit: 2017-04-04 | Discharge: 2017-04-04 | Disposition: A | Discharge status: Home / Self Care | Payer: Medicare Other | Source: Ambulatory Visit | Attending: Urology | Admitting: Urology

## 2017-04-04 ENCOUNTER — Ambulatory Visit (HOSPITAL_COMMUNITY)
Admission: RE | Admit: 2017-04-04 | Discharge: 2017-04-04 | Disposition: A | Discharge status: Home / Self Care | Payer: Medicare Other | Source: Ambulatory Visit | Attending: Urology | Admitting: Urology

## 2017-04-04 DIAGNOSIS — Z01818 Encounter for other preprocedural examination: Secondary | ICD-10-CM | POA: Insufficient documentation

## 2017-04-04 DIAGNOSIS — I10 Essential (primary) hypertension: Secondary | ICD-10-CM | POA: Diagnosis not present

## 2017-04-04 DIAGNOSIS — R9431 Abnormal electrocardiogram [ECG] [EKG]: Secondary | ICD-10-CM | POA: Insufficient documentation

## 2017-04-04 DIAGNOSIS — C61 Malignant neoplasm of prostate: Secondary | ICD-10-CM | POA: Diagnosis not present

## 2017-04-04 DIAGNOSIS — I440 Atrioventricular block, first degree: Secondary | ICD-10-CM | POA: Diagnosis not present

## 2017-04-15 ENCOUNTER — Telehealth: Payer: Self-pay | Admitting: *Deleted

## 2017-04-15 NOTE — Telephone Encounter (Signed)
Patient had called requesting refill on Accupril. Med not on medlist. Looks like it was d/cd 10/19/16 OV. Patient unaware & has been taking med until last Wed when he ran out. Last OV 10/19/16, no upcoming appts. pls advise.

## 2017-04-17 ENCOUNTER — Other Ambulatory Visit: Payer: Self-pay | Admitting: *Deleted

## 2017-04-17 DIAGNOSIS — E1149 Type 2 diabetes mellitus with other diabetic neurological complication: Secondary | ICD-10-CM

## 2017-04-17 MED ORDER — GLIPIZIDE 10 MG PO TABS: 10.0000 mg | ORAL_TABLET | Freq: Two times a day (BID) | ORAL | 1 refills | Status: DC

## 2017-04-17 NOTE — Telephone Encounter (Signed)
Please schedule him for a follow-up appt. Thanks.

## 2017-04-22 DIAGNOSIS — C61 Malignant neoplasm of prostate: Secondary | ICD-10-CM | POA: Diagnosis not present

## 2017-04-22 DIAGNOSIS — N184 Chronic kidney disease, stage 4 (severe): Secondary | ICD-10-CM | POA: Diagnosis not present

## 2017-04-24 DIAGNOSIS — I129 Hypertensive chronic kidney disease with stage 1 through stage 4 chronic kidney disease, or unspecified chronic kidney disease: Secondary | ICD-10-CM | POA: Diagnosis not present

## 2017-04-24 DIAGNOSIS — E1122 Type 2 diabetes mellitus with diabetic chronic kidney disease: Secondary | ICD-10-CM | POA: Diagnosis not present

## 2017-04-24 DIAGNOSIS — D631 Anemia in chronic kidney disease: Secondary | ICD-10-CM | POA: Diagnosis not present

## 2017-04-24 DIAGNOSIS — R809 Proteinuria, unspecified: Secondary | ICD-10-CM | POA: Diagnosis not present

## 2017-04-24 DIAGNOSIS — N183 Chronic kidney disease, stage 3 (moderate): Secondary | ICD-10-CM | POA: Diagnosis not present

## 2017-04-24 DIAGNOSIS — N2581 Secondary hyperparathyroidism of renal origin: Secondary | ICD-10-CM | POA: Diagnosis not present

## 2017-04-24 DIAGNOSIS — Z23 Encounter for immunization: Secondary | ICD-10-CM | POA: Diagnosis not present

## 2017-04-30 ENCOUNTER — Telehealth: Payer: Self-pay | Admitting: *Deleted

## 2017-04-30 NOTE — Telephone Encounter (Signed)
CALLED PATIENT TO REMIND OF LABS FOR IMPLANT ON 05-01-17 @ 2 PM, LVM FOR A RETURN CALL

## 2017-05-01 ENCOUNTER — Inpatient Hospital Stay (HOSPITAL_COMMUNITY): Admission: RE | Admit: 2017-05-01 | Payer: Medicare Other | Source: Ambulatory Visit

## 2017-05-02 ENCOUNTER — Encounter (HOSPITAL_COMMUNITY)
Admission: RE | Admit: 2017-05-02 | Discharge: 2017-05-02 | Disposition: A | Discharge status: Home / Self Care | Payer: Medicare Other | Source: Ambulatory Visit | Attending: Urology | Admitting: Urology

## 2017-05-02 ENCOUNTER — Encounter (INDEPENDENT_AMBULATORY_CARE_PROVIDER_SITE_OTHER): Payer: Self-pay

## 2017-05-02 DIAGNOSIS — E1169 Type 2 diabetes mellitus with other specified complication: Secondary | ICD-10-CM | POA: Insufficient documentation

## 2017-05-02 DIAGNOSIS — E1149 Type 2 diabetes mellitus with other diabetic neurological complication: Secondary | ICD-10-CM | POA: Diagnosis not present

## 2017-05-02 DIAGNOSIS — Z01812 Encounter for preprocedural laboratory examination: Secondary | ICD-10-CM | POA: Diagnosis not present

## 2017-05-02 DIAGNOSIS — E1159 Type 2 diabetes mellitus with other circulatory complications: Secondary | ICD-10-CM | POA: Insufficient documentation

## 2017-05-02 DIAGNOSIS — N183 Chronic kidney disease, stage 3 (moderate): Secondary | ICD-10-CM | POA: Diagnosis not present

## 2017-05-02 DIAGNOSIS — D649 Anemia, unspecified: Secondary | ICD-10-CM | POA: Insufficient documentation

## 2017-05-02 DIAGNOSIS — I2510 Atherosclerotic heart disease of native coronary artery without angina pectoris: Secondary | ICD-10-CM | POA: Insufficient documentation

## 2017-05-02 DIAGNOSIS — I10 Essential (primary) hypertension: Secondary | ICD-10-CM | POA: Diagnosis not present

## 2017-05-02 DIAGNOSIS — Z8673 Personal history of transient ischemic attack (TIA), and cerebral infarction without residual deficits: Secondary | ICD-10-CM | POA: Insufficient documentation

## 2017-05-02 DIAGNOSIS — E785 Hyperlipidemia, unspecified: Secondary | ICD-10-CM | POA: Diagnosis not present

## 2017-05-02 DIAGNOSIS — E1122 Type 2 diabetes mellitus with diabetic chronic kidney disease: Secondary | ICD-10-CM | POA: Diagnosis not present

## 2017-05-02 LAB — COMPREHENSIVE METABOLIC PANEL
ALBUMIN: 3.7 g/dL (ref 3.5–5.0)
ALT: 27 U/L (ref 17–63)
AST: 22 U/L (ref 15–41)
Alkaline Phosphatase: 62 U/L (ref 38–126)
Anion gap: 8 (ref 5–15)
BUN: 36 mg/dL — AB (ref 6–20)
CHLORIDE: 100 mmol/L — AB (ref 101–111)
CO2: 27 mmol/L (ref 22–32)
CREATININE: 1.69 mg/dL — AB (ref 0.61–1.24)
Calcium: 9.6 mg/dL (ref 8.9–10.3)
GFR calc Af Amer: 50 mL/min — ABNORMAL LOW (ref >60)
GFR calc non Af Amer: 43 mL/min — ABNORMAL LOW (ref >60)
Glucose, Bld: 241 mg/dL — ABNORMAL HIGH (ref 65–99)
POTASSIUM: 4.5 mmol/L (ref 3.5–5.1)
SODIUM: 135 mmol/L (ref 135–145)
Total Bilirubin: 0.8 mg/dL (ref 0.3–1.2)
Total Protein: 8 g/dL (ref 6.5–8.1)

## 2017-05-02 LAB — CBC
HCT: 34.5 % — ABNORMAL LOW (ref 39.0–52.0)
HEMOGLOBIN: 11.5 g/dL — AB (ref 13.0–17.0)
MCH: 29.2 pg (ref 26.0–34.0)
MCHC: 33.3 g/dL (ref 30.0–36.0)
MCV: 87.6 fL (ref 78.0–100.0)
PLATELETS: 282 10*3/uL (ref 150–400)
RBC: 3.94 MIL/uL — AB (ref 4.22–5.81)
RDW: 14.5 % (ref 11.5–15.5)
WBC: 8.4 10*3/uL (ref 4.0–10.5)

## 2017-05-02 LAB — PROTIME-INR
INR: 1.1
Prothrombin Time: 14.1 seconds (ref 11.4–15.2)

## 2017-05-02 LAB — APTT: aPTT: 31 seconds (ref 24–36)

## 2017-05-03 ENCOUNTER — Encounter (HOSPITAL_BASED_OUTPATIENT_CLINIC_OR_DEPARTMENT_OTHER): Payer: Self-pay | Admitting: *Deleted

## 2017-05-03 ENCOUNTER — Other Ambulatory Visit: Payer: Self-pay

## 2017-05-03 DIAGNOSIS — C61 Malignant neoplasm of prostate: Secondary | ICD-10-CM | POA: Diagnosis not present

## 2017-05-03 NOTE — Progress Notes (Signed)
Received call from wife.  Appointment made to see [name] 12/21.  Wife tried unsuccessfully to get appt prior to Wednesday.

## 2017-05-03 NOTE — Progress Notes (Signed)
Reviewed c Dr. Tobias Alexander. He recommended that pt see his cardiologist if he can before the procedure.  "I won't cancel him, but he needs to see his cardiologist.".  Left message for [facility] @ [facility].
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
## 2017-05-07 ENCOUNTER — Telehealth: Payer: Self-pay | Admitting: *Deleted

## 2017-05-07 ENCOUNTER — Ambulatory Visit (INDEPENDENT_AMBULATORY_CARE_PROVIDER_SITE_OTHER): Payer: Medicare Other | Admitting: Physician Assistant

## 2017-05-07 ENCOUNTER — Encounter: Payer: Self-pay | Admitting: Physician Assistant

## 2017-05-07 ENCOUNTER — Encounter: Payer: Self-pay | Admitting: *Deleted

## 2017-05-07 VITALS — BP 112/68 | HR 66 | Ht 72.0 in | Wt 203.8 lb

## 2017-05-07 DIAGNOSIS — E1159 Type 2 diabetes mellitus with other circulatory complications: Secondary | ICD-10-CM | POA: Diagnosis not present

## 2017-05-07 DIAGNOSIS — I10 Essential (primary) hypertension: Secondary | ICD-10-CM | POA: Diagnosis not present

## 2017-05-07 DIAGNOSIS — Z01818 Encounter for other preprocedural examination: Secondary | ICD-10-CM

## 2017-05-07 DIAGNOSIS — I2510 Atherosclerotic heart disease of native coronary artery without angina pectoris: Secondary | ICD-10-CM

## 2017-05-07 NOTE — Telephone Encounter (Signed)
CALLED PATIENT TO REMIND OF PROCEDURE FOR 05-08-17, SPOKE WITH PATIENT AND HE IS AWARE OF THIS PROCEDURE

## 2017-05-07 NOTE — Patient Instructions (Signed)
Medication Instructions: Your physician recommends that you continue on your current medications as directed. Please refer to the Current Medication list given to you today.  If you need a refill on your cardiac medications before your next appointment, please call your pharmacy.    Follow-Up: Your physician wants you to follow-up in: 12 months with [name] or Rosaria Ferries, PA. You will receive a reminder letter in the mail two months in advance. If you don't receive a letter, please call our office at [phone] to schedule this follow-up appointment.   Thank you for choosing [facility] at [facility]!!

## 2017-05-07 NOTE — Anesthesia Preprocedure Evaluation (Addendum)
Anesthesia Evaluation  Patient identified by MRN, date of birth, ID band Patient awake    Reviewed: Allergy & Precautions, NPO status , Patient's Chart, lab work & pertinent test results, reviewed documented beta blocker date and time   History of Anesthesia Complications Negative for: history of anesthetic complications  Airway Mallampati: II  TM Distance: >3 FB Neck ROM: Full    Dental no notable dental hx. (+) Dental Advisory Given   Pulmonary sleep apnea ,    Pulmonary exam normal        Cardiovascular hypertension, Pt. on home beta blockers + angina + CAD and + Cardiac Stents  Normal cardiovascular exam  Study Conclusions  - Left ventricle: The cavity size was normal. Systolic function was normal. The estimated ejection fraction was in the range of 55% to 60%. Wall motion was normal; there were no regional wall motion abnormalities. - Aortic valve: calcified non coronary cusp - Mitral valve: Calcified annulus. Mildly thickened leaflets .   Neuro/Psych CVA, No Residual Symptoms    GI/Hepatic negative GI ROS, Neg liver ROS,   Endo/Other  diabetes  Renal/GU Renal InsufficiencyRenal disease     Musculoskeletal negative musculoskeletal ROS (+)   Abdominal   Peds  Hematology negative hematology ROS (+)   Anesthesia Other Findings Day of surgery medications reviewed with the patient.  Reproductive/Obstetrics                           Anesthesia Physical Anesthesia Plan  ASA: III  Anesthesia Plan: General   Post-op Pain Management:    Induction: Intravenous  PONV Risk Score and Plan: 3 and Ondansetron, Dexamethasone and Scopolamine patch - Pre-op  Airway Management Planned: LMA  Additional Equipment:   Intra-op Plan:   Post-operative Plan: Extubation in OR  Informed Consent: I have reviewed the patients History and Physical, chart, labs and discussed the procedure  including the risks, benefits and alternatives for the proposed anesthesia with the patient or authorized representative who has indicated his/her understanding and acceptance.   Dental advisory given  Plan Discussed with: CRNA and Anesthesiologist  Anesthesia Plan Comments:        Anesthesia Quick Evaluation

## 2017-05-07 NOTE — Progress Notes (Signed)
Cardiology Office Note   Date:  05/07/2017   ID:  Kevin Soto, DOB [birth year], MRN [id number]  PCP:  Shela Leff, MD  Cardiologist: [name], 05/02/2015 Rosaria Ferries, PA-C   Chief Complaint  Patient presents with  . Medical Clearance    History of Present Illness: Kevin Soto is a 56 y.o. male with a history of DM, HTN, HLD, CAD w/ mLAD 100%, D1 80% OM branch 90%, RCA 99%>>DES x 2, EF 40% 12/2010; CKD III, CVA, OSA, remote ETOH abuse  He needs radiation seed implants, tentatively scheduled for 12/19. It requires general anesthesia.   Kevin Soto presents for cardiology follow up.   He never gets chest pain.   His PCP follows his lipids and has kept him on his medications.   He exercises 2 x week. He uses the weight room, swims and walks the track. His CVA does not limit him in exercising. He used to do more, but had slowed down.   He never gets chest pain.   He feels he can do a flight of stairs without stopping, but has not had to do that recently. However, he did the stationary bike and walked the track as well as doing some weights, yesterday. No CP or SOB with exertion. He gets a little SOB w/ exertion, but not extreme.   No LE edema, no orthopnea or PND. Does not feel he gets unusually SOB w/ exertion, no recent change.   He sees Dr Arty Baumgartner for renal function. Sees Dr Tammi Klippel for prostate CA. His PSA has dropped from 45 to 0.9 with an injection>>seed implant tomorrow and injection in January.   Past Medical History:  Diagnosis Date  . Abdominal pain    lower abd  . Abnormal abdominal CT scan    wall thickening  . Anemia   . CAD (coronary artery disease)    cath 8/12:  LM ok, mLAD occluded with trivial collats from right AM and dRCA, mDx (small) 80%, RI 25%, tiny branch of OM 90%, mAM 30-40%, RCA sub-totally occluded, then 80%, EF 40%, inf HK.  He underwent PCI with [name] with placement of a Promus DES to the mRCA x 2  .  Chronic kidney disease    stage 3 per chart  . Diabetes mellitus   . Dyslipidemia   . ED (erectile dysfunction)   . Gait instability 02/27/2011  . History of alcohol abuse   . History of stomach ulcers   . History of stroke    evidence of CVA on MRI in past  . Hyperlipidemia   . Hypertension   . Ischemic cardiomyopathy    echo 7/12: EF 40-45%, septal, apical, inf basal HK, mod LVH, mild MR, mild LAE.  EF 55% by echo 2015  . Macular hemorrhage   . Macular hemorrhage   . Memory change   . Prostate cancer ([place])   . PSA elevation   . Sleep apnea    no c-pap; wife denies  . Stroke [facility]) 2012   gait distrubance; wife denies use of assistive device  . Vision, loss, sudden 02/27/2011  . Wears glasses   . Weight loss     Past Surgical History:  Procedure Laterality Date  . CORONARY ANGIOPLASTY WITH STENT PLACEMENT  2012   2 stents  . REFRACTIVE SURGERY  2012   right and left eye/ per pt only left eye    Current Outpatient Medications  Medication Sig Dispense Refill  . ACCU-CHEK  FASTCLIX LANCETS MISC Use to check blood sugar two times a day 102 each 12  . aspirin EC 81 MG tablet Take 1 tablet (81 mg total) by mouth daily.    Marland Kitchen bismuth subsalicylate (PEPTO-BISMOL) 262 MG chewable tablet Take 2 tablets by mouth twice daily x 14 days 56 tablet 0  . Blood Glucose Monitoring Suppl (ACCU-CHEK GUIDE) w/Device KIT 1 each by Does not apply route 2 (two) times daily. 1 kit 0  . carvedilol (COREG) 25 MG tablet Take 25 mg by mouth 2 (two) times daily with a meal.    . clopidogrel (PLAVIX) 75 MG tablet TAKE 1 TABLET BY MOUTH  DAILY 90 tablet 4  . glipiZIDE (GLUCOTROL) 10 MG tablet Take 1 tablet (10 mg total) by mouth 2 (two) times daily before a meal. 180 tablet 1  . glucose blood (ACCU-CHEK GUIDE) test strip Use as instructed 100 each 12  . hydrochlorothiazide (HYDRODIURIL) 25 MG tablet Take 25 mg by mouth daily.    . nitroGLYCERIN (NITROSTAT) 0.4 MG SL tablet Place 1 tablet (0.4 mg total)  under the tongue every 5 (five) minutes as needed. 100 tablet 1  . quinapril (ACCUPRIL) 40 MG tablet Take 40 mg by mouth at bedtime.      Current Facility-Administered Medications  Medication Dose Route Frequency Provider Last Rate Last Dose  . 0.9 %  sodium chloride infusion  500 mL Intravenous Continuous Ladene Artist, MD        Allergies:   Clonidine derivatives    Social History:  The patient  reports that  has never smoked. he has never used smokeless tobacco. He reports that he drinks about 2.4 oz of alcohol per week. He reports that he does not use drugs.   Family History:  The patient's family history includes Cancer in his father; Crohn's disease in his other and sister; Diabetes in his father and mother; Heart attack in his father and mother; Heart disease in his sister; Heart failure in his father and mother; Kidney disease in his brother; Prostate cancer in his father. He was adopted.    ROS:  Please see the history of present illness. All other systems are reviewed and negative.    PHYSICAL EXAM: VS:  BP 112/68   Pulse 66   Ht 6' (1.829 m)   Wt 203 lb 12.8 oz (92.4 kg)   BMI 27.64 kg/m  , BMI Body mass index is 27.64 kg/m. GEN: Well nourished, well developed, male in no acute distress  HEENT: normal for age  Neck: no JVD, no carotid bruit, no masses Cardiac: RRR; no murmur, no rubs, or gallops Respiratory:  clear to auscultation bilaterally, normal work of breathing GI: soft, nontender, nondistended, + BS MS: no deformity or atrophy; no edema; distal pulses are 2+ in all 4 extremities   Skin: warm and dry, no rash Neuro:  Strength and sensation are intact Psych: euthymic mood, full affect   EKG:  EKG is ordered today. The ekg ordered today demonstrates sinus rhythm, heart rate 66, diffuse anterolateral T wave changes, asymptomatic; different from 04/04/2017 ECG but similar to 04/2013 ECG  ECHO: 05/19/2013 - Left ventricle: The cavity size was normal.  Systolic function was normal. The estimated ejection fraction was in the range of 55% to 60%. Wall motion was normal; there were no regional wall motion abnormalities. - Aortic valve: calcified non coronary cusp - Mitral valve: Calcified annulus. Mildly thickened leaflets. - Atrial septum: No defect or patent foramen ovale was  identified.  CATH: 01/11/2011  Coronaries:  Left main was normal.  LAD was occluded in the mid segment.  There was trivial collateral flow seen from the right acute marginal and distal right coronary artery.  There is a diagonal, which is small with diffuse disease along the 80% stenosis.  There is a long mid 80% stenosis. There is a ramus intermediate which is large branching and got 25% stenosis in the main segment.  There has been a large territory.  The circumflex and AV groove had luminal irregularities.  There is a distal obtuse marginal, which is small.  This vessel had a very tiny branch with 90% stenosis.  The right coronary artery was a dominant vessel. There was a large acute marginal with mid 30-40% stenosis with some septal perforator.  Following this, there was a long subtotal stenosis followed by 80% disease before the PDA and posterolateral.  PDA and posterolateral were somewhat small-to-moderate sized vessels with some diffuse disease.  Left ventriculogram:  The left ventriculogram was obtained in the RAO projection.  The EF was 40% with inferior hypokinesis. CONCLUSION:  Severe two-vessel coronary artery disease.  Ischemic cardiomyopathy. PLAN:  After careful consideration, we will attempt revascularization of the right coronary artery.  He will need aggressive risk reduction and medical management.  PCI: 01/11/2011 IMPRESSION:  Successful percutaneous transluminal coronary angioplasty with placement two overlapping drug-eluting stents in the mid and distal right coronary artery.  2.5 x 38-mm Promus and 2.75 x 12-mm Promus  DES  RECOMMENDATIONS:  The patient will be continued on aspirin and Plavix for at least 1 year.  We will also continue his beta-blocker and statin.  Recent Labs: 09/18/2016: TSH 1.560 05/02/2017: ALT 27; BUN 36; Creatinine, Ser 1.69; Hemoglobin 11.5; Platelets 282; Potassium 4.5; Sodium 135    Lipid Panel    Component Value Date/Time   CHOL 149 05/08/2016 1457   TRIG 89 05/08/2016 1457   HDL 39 (L) 05/08/2016 1457   CHOLHDL 3.8 05/08/2016 1457   CHOLHDL 3.3 08/17/2014 1532   VLDL 17 08/17/2014 1532   LDLCALC 92 05/08/2016 1457     Wt Readings from Last 3 Encounters:  05/07/17 203 lb 12.8 oz (92.4 kg)  02/28/17 200 lb (90.7 kg)  02/27/17 200 lb 3.2 oz (90.8 kg)     Other studies Reviewed: Additional studies/ records that were reviewed today include: Office notes, hospital records and testing.  ASSESSMENT AND PLAN:  1.  Preoperative evaluation: Kevin Soto has been revascularized, incompletely.  He is asymptomatic from a cardiac standpoint and has good exercise tolerance.  This is not a high risk procedure. His RCRI IS 6.6% from a score of 2.    He is at slightly increased but acceptable risk for the planned procedure without further cardiac workup.  2.  CAD: He was revascularized in 2012 with 2 drug-eluting stents to the RCA.  The LAD was chronically occluded, diagonal 80% and OM branch 90%.  Medical therapy was recommended for these as they are too small to be revascularized or chronically occluded and cannot be opened.  He has been asymptomatic since that time.  He is on aspirin, Lipitor 40, carvedilol 25 mg twice daily and Plavix.    Continue these medications, but it is okay to hold the aspirin and Plavix temporarily as needed for procedure.  3.  Hypertension: His blood pressure is under good control on carvedilol 25 mg twice daily, Accupril 40 mg daily and HCTZ 25 mg daily.  His labs are  followed by his PCP.  4.  Hyperlipidemia: He is on Lipitor 40 mg daily.  He and his  wife were made aware that his goal LDL is 70 or less.  Current medicines are reviewed at length with the patient today.  The patient does not have concerns regarding medicines.  The following changes have been made:  no change  Labs/ tests ordered today include:  No orders of the defined types were placed in this encounter.    Disposition:   FU with [name]  Signed, Rosaria Ferries, PA-C  05/07/2017 11:45 AM    [place] Phone: [phone]; Fax: [phone]  This note was written with the assistance of speech recognition software. Please excuse any transcriptional errors.

## 2017-05-07 NOTE — Progress Notes (Signed)
RECEIVED MESSAGE FROM CON[place], OR SCHEDULER FOR [facility], VIA PHONE.  STATED SHE RECEIVED CALL FROM DR [facility] OFFICE, PT CARDIOLOGIST,  NURSE AND PT WAS SEEN TODAY BY PA , CLEARANCE WAS GIVEN AND FAXED TO [facility].  CALLED AND LM FOR CONI THE MESSAGE WAS RECEIVED AND WHEN SHE RECEIVES CLEARANCE PLEASE TO [place] # [phone].

## 2017-05-08 ENCOUNTER — Encounter (HOSPITAL_BASED_OUTPATIENT_CLINIC_OR_DEPARTMENT_OTHER)
Admission: RE | Disposition: A | Discharge status: Home / Self Care | Payer: Self-pay | Source: Ambulatory Visit | Attending: Urology

## 2017-05-08 ENCOUNTER — Ambulatory Visit (HOSPITAL_BASED_OUTPATIENT_CLINIC_OR_DEPARTMENT_OTHER): Payer: Medicare Other | Admitting: Anesthesiology

## 2017-05-08 ENCOUNTER — Ambulatory Visit (HOSPITAL_BASED_OUTPATIENT_CLINIC_OR_DEPARTMENT_OTHER)
Admission: RE | Admit: 2017-05-08 | Discharge: 2017-05-08 | Disposition: A | Discharge status: Home / Self Care | Payer: Medicare Other | Source: Ambulatory Visit | Attending: Urology | Admitting: Urology

## 2017-05-08 ENCOUNTER — Encounter (HOSPITAL_COMMUNITY): Payer: Self-pay

## 2017-05-08 ENCOUNTER — Ambulatory Visit (HOSPITAL_COMMUNITY)
Admission: RE | Admit: 2017-05-08 | Discharge: 2017-05-08 | Disposition: A | Discharge status: Home / Self Care | Payer: Medicare Other | Source: Ambulatory Visit | Attending: Urology | Admitting: Urology

## 2017-05-08 ENCOUNTER — Ambulatory Visit (HOSPITAL_COMMUNITY): Payer: Medicare Other

## 2017-05-08 ENCOUNTER — Encounter (HOSPITAL_BASED_OUTPATIENT_CLINIC_OR_DEPARTMENT_OTHER): Payer: Self-pay | Admitting: Anesthesiology

## 2017-05-08 DIAGNOSIS — E1122 Type 2 diabetes mellitus with diabetic chronic kidney disease: Secondary | ICD-10-CM | POA: Insufficient documentation

## 2017-05-08 DIAGNOSIS — Z833 Family history of diabetes mellitus: Secondary | ICD-10-CM | POA: Diagnosis not present

## 2017-05-08 DIAGNOSIS — Z8379 Family history of other diseases of the digestive system: Secondary | ICD-10-CM | POA: Diagnosis not present

## 2017-05-08 DIAGNOSIS — Z8673 Personal history of transient ischemic attack (TIA), and cerebral infarction without residual deficits: Secondary | ICD-10-CM | POA: Diagnosis not present

## 2017-05-08 DIAGNOSIS — I2510 Atherosclerotic heart disease of native coronary artery without angina pectoris: Secondary | ICD-10-CM | POA: Diagnosis not present

## 2017-05-08 DIAGNOSIS — Z8719 Personal history of other diseases of the digestive system: Secondary | ICD-10-CM | POA: Insufficient documentation

## 2017-05-08 DIAGNOSIS — N183 Chronic kidney disease, stage 3 (moderate): Secondary | ICD-10-CM | POA: Diagnosis not present

## 2017-05-08 DIAGNOSIS — Z841 Family history of disorders of kidney and ureter: Secondary | ICD-10-CM | POA: Diagnosis not present

## 2017-05-08 DIAGNOSIS — C61 Malignant neoplasm of prostate: Secondary | ICD-10-CM | POA: Diagnosis not present

## 2017-05-08 DIAGNOSIS — I255 Ischemic cardiomyopathy: Secondary | ICD-10-CM | POA: Diagnosis not present

## 2017-05-08 DIAGNOSIS — I129 Hypertensive chronic kidney disease with stage 1 through stage 4 chronic kidney disease, or unspecified chronic kidney disease: Secondary | ICD-10-CM | POA: Insufficient documentation

## 2017-05-08 DIAGNOSIS — N184 Chronic kidney disease, stage 4 (severe): Secondary | ICD-10-CM | POA: Insufficient documentation

## 2017-05-08 DIAGNOSIS — Z79899 Other long term (current) drug therapy: Secondary | ICD-10-CM | POA: Diagnosis not present

## 2017-05-08 DIAGNOSIS — Z7982 Long term (current) use of aspirin: Secondary | ICD-10-CM | POA: Diagnosis not present

## 2017-05-08 DIAGNOSIS — Z8042 Family history of malignant neoplasm of prostate: Secondary | ICD-10-CM | POA: Insufficient documentation

## 2017-05-08 DIAGNOSIS — Z955 Presence of coronary angioplasty implant and graft: Secondary | ICD-10-CM | POA: Diagnosis not present

## 2017-05-08 DIAGNOSIS — Z9889 Other specified postprocedural states: Secondary | ICD-10-CM | POA: Insufficient documentation

## 2017-05-08 DIAGNOSIS — G4730 Sleep apnea, unspecified: Secondary | ICD-10-CM | POA: Insufficient documentation

## 2017-05-08 DIAGNOSIS — Z8546 Personal history of malignant neoplasm of prostate: Secondary | ICD-10-CM

## 2017-05-08 DIAGNOSIS — Z7984 Long term (current) use of oral hypoglycemic drugs: Secondary | ICD-10-CM | POA: Diagnosis not present

## 2017-05-08 DIAGNOSIS — Z7902 Long term (current) use of antithrombotics/antiplatelets: Secondary | ICD-10-CM | POA: Insufficient documentation

## 2017-05-08 DIAGNOSIS — Z8249 Family history of ischemic heart disease and other diseases of the circulatory system: Secondary | ICD-10-CM | POA: Insufficient documentation

## 2017-05-08 HISTORY — DX: Personal history of peptic ulcer disease: Z87.11

## 2017-05-08 HISTORY — DX: Male erectile dysfunction, unspecified: N52.9

## 2017-05-08 HISTORY — DX: Chronic kidney disease, unspecified: N18.9

## 2017-05-08 HISTORY — DX: Personal history of other diseases of the digestive system: Z87.19

## 2017-05-08 HISTORY — PX: RADIOACTIVE SEED IMPLANT: SHX5150

## 2017-05-08 HISTORY — PX: SPACE OAR INSTILLATION: SHX6769

## 2017-05-08 LAB — GLUCOSE, CAPILLARY
GLUCOSE-CAPILLARY: 148 mg/dL — AB (ref 65–99)
GLUCOSE-CAPILLARY: 173 mg/dL — AB (ref 65–99)

## 2017-05-08 SURGERY — INSERTION, RADIATION SOURCE, PROSTATE
Anesthesia: General

## 2017-05-08 MED ORDER — SCOPOLAMINE 1 MG/3DAYS TD PT72
MEDICATED_PATCH | TRANSDERMAL | Status: AC
  Filled 2017-05-08: qty 1

## 2017-05-08 MED ORDER — CEFTRIAXONE SODIUM 1 G IJ SOLR
1.0000 g | Freq: Once | INTRAMUSCULAR | Status: AC
  Administered 2017-05-08: 1 g via INTRAVENOUS
  Filled 2017-05-08: qty 10

## 2017-05-08 MED ORDER — LIDOCAINE 2% (20 MG/ML) 5 ML SYRINGE
INTRAMUSCULAR | Status: AC
  Filled 2017-05-08: qty 5

## 2017-05-08 MED ORDER — MIDAZOLAM HCL 2 MG/2ML IJ SOLN
INTRAMUSCULAR | Status: AC
  Filled 2017-05-08: qty 2

## 2017-05-08 MED ORDER — IOHEXOL 300 MG/ML  SOLN
INTRAMUSCULAR | Status: DC | PRN
  Administered 2017-05-08: 7 mL via URETHRAL

## 2017-05-08 MED ORDER — DEXTROSE 5 % IV SOLN
INTRAVENOUS | Status: AC
  Filled 2017-05-08: qty 50

## 2017-05-08 MED ORDER — SODIUM CHLORIDE 0.9 % IR SOLN
Status: DC | PRN
  Administered 2017-05-08: 1000 mL via INTRAVESICAL

## 2017-05-08 MED ORDER — DEXAMETHASONE SODIUM PHOSPHATE 10 MG/ML IJ SOLN
INTRAMUSCULAR | Status: AC
  Filled 2017-05-08: qty 1

## 2017-05-08 MED ORDER — LIDOCAINE HCL (CARDIAC) 20 MG/ML IV SOLN
INTRAVENOUS | Status: DC | PRN
  Administered 2017-05-08: 60 mg via INTRAVENOUS

## 2017-05-08 MED ORDER — FLEET ENEMA 7-19 GM/118ML RE ENEM
1.0000 | ENEMA | Freq: Once | RECTAL | Status: DC
  Filled 2017-05-08: qty 1

## 2017-05-08 MED ORDER — ONDANSETRON HCL 4 MG/2ML IJ SOLN
INTRAMUSCULAR | Status: DC | PRN
  Administered 2017-05-08: 4 mg via INTRAVENOUS

## 2017-05-08 MED ORDER — FENTANYL CITRATE (PF) 100 MCG/2ML IJ SOLN
INTRAMUSCULAR | Status: AC
  Filled 2017-05-08: qty 2

## 2017-05-08 MED ORDER — SODIUM CHLORIDE 0.9 % IJ SOLN
INTRAMUSCULAR | Status: DC | PRN
  Administered 2017-05-08: 10 mL

## 2017-05-08 MED ORDER — ONDANSETRON HCL 4 MG/2ML IJ SOLN
INTRAMUSCULAR | Status: AC
  Filled 2017-05-08: qty 2

## 2017-05-08 MED ORDER — SCOPOLAMINE 1 MG/3DAYS TD PT72
1.0000 | MEDICATED_PATCH | TRANSDERMAL | Status: DC
  Administered 2017-05-08: 1.5 mg via TRANSDERMAL
  Filled 2017-05-08: qty 1

## 2017-05-08 MED ORDER — HYDROMORPHONE HCL 1 MG/ML IJ SOLN
0.2500 mg | INTRAMUSCULAR | Status: DC | PRN
  Filled 2017-05-08: qty 0.5

## 2017-05-08 MED ORDER — SENNOSIDES-DOCUSATE SODIUM 8.6-50 MG PO TABS: 1.0000 | ORAL_TABLET | Freq: Two times a day (BID) | ORAL | 0 refills | Status: DC

## 2017-05-08 MED ORDER — SODIUM CHLORIDE 0.9 % IV SOLN
INTRAVENOUS | Status: DC
  Administered 2017-05-08: 1000 mL via INTRAVENOUS
  Filled 2017-05-08: qty 1000

## 2017-05-08 MED ORDER — DEXAMETHASONE SODIUM PHOSPHATE 4 MG/ML IJ SOLN
INTRAMUSCULAR | Status: DC | PRN
  Administered 2017-05-08: 10 mg via INTRAVENOUS

## 2017-05-08 MED ORDER — CEFTRIAXONE SODIUM 1 G IJ SOLR
INTRAMUSCULAR | Status: AC
  Filled 2017-05-08: qty 10

## 2017-05-08 MED ORDER — TRAMADOL HCL 50 MG PO TABS: 50.0000 mg | ORAL_TABLET | Freq: Four times a day (QID) | ORAL | 0 refills | Status: DC | PRN

## 2017-05-08 MED ORDER — SODIUM CHLORIDE 0.9 % IV SOLN: INTRAVENOUS | Status: DC | PRN

## 2017-05-08 MED ORDER — STERILE WATER FOR IRRIGATION IR SOLN
Status: DC | PRN
  Administered 2017-05-08: 500 mL

## 2017-05-08 MED ORDER — EPHEDRINE SULFATE 50 MG/ML IJ SOLN
INTRAMUSCULAR | Status: DC | PRN
  Administered 2017-05-08: 10 mg via INTRAVENOUS

## 2017-05-08 MED ORDER — PROPOFOL 10 MG/ML IV BOLUS
INTRAVENOUS | Status: AC
  Filled 2017-05-08: qty 40

## 2017-05-08 MED ORDER — TAMSULOSIN HCL 0.4 MG PO CAPS: 0.4000 mg | ORAL_CAPSULE | Freq: Every day | ORAL | 5 refills | Status: DC | PRN

## 2017-05-08 MED ORDER — PROPOFOL 10 MG/ML IV BOLUS
INTRAVENOUS | Status: DC | PRN
  Administered 2017-05-08: 160 mg via INTRAVENOUS

## 2017-05-08 MED ORDER — PROMETHAZINE HCL 25 MG/ML IJ SOLN
6.2500 mg | INTRAMUSCULAR | Status: DC | PRN
  Filled 2017-05-08: qty 1

## 2017-05-08 MED ORDER — FENTANYL CITRATE (PF) 100 MCG/2ML IJ SOLN
INTRAMUSCULAR | Status: DC | PRN
  Administered 2017-05-08 (×4): 25 ug via INTRAVENOUS

## 2017-05-08 SURGICAL SUPPLY — 30 items
BAG URINE DRAINAGE (UROLOGICAL SUPPLIES) ×3 IMPLANT
BLADE CLIPPER SURG (BLADE) ×3 IMPLANT
CATH FOLEY 2WAY SLVR  5CC 16FR (CATHETERS) ×2
CATH FOLEY 2WAY SLVR 5CC 16FR (CATHETERS) ×1 IMPLANT
CATH ROBINSON RED A/P 16FR (CATHETERS) IMPLANT
CATH ROBINSON RED A/P 20FR (CATHETERS) ×3 IMPLANT
CLOTH BEACON ORANGE TIMEOUT ST (SAFETY) ×3 IMPLANT
COVER BACK TABLE 60X90IN (DRAPES) ×3 IMPLANT
COVER MAYO STAND STRL (DRAPES) ×3 IMPLANT
COVER TABLE BACK 60X90 (DRAPES) ×3 IMPLANT
DRSG TEGADERM 4X4.75 (GAUZE/BANDAGES/DRESSINGS) ×3 IMPLANT
DRSG TEGADERM 8X12 (GAUZE/BANDAGES/DRESSINGS) ×6 IMPLANT
GAUZE SPONGE 4X4 12PLY STRL LF (GAUZE/BANDAGES/DRESSINGS) ×3 IMPLANT
GLOVE BIO SURGEON STRL SZ7.5 (GLOVE) ×3 IMPLANT
GLOVE ECLIPSE 8.0 STRL XLNG CF (GLOVE) ×3 IMPLANT
GOWN STRL REUS W/TWL LRG LVL3 (GOWN DISPOSABLE) ×3 IMPLANT
GOWN STRL REUS W/TWL XL LVL3 (GOWN DISPOSABLE) ×3 IMPLANT
HOLDER FOLEY CATH W/STRAP (MISCELLANEOUS) IMPLANT
IMPL SPACEOAR SYSTEM 10ML (MISCELLANEOUS) ×1 IMPLANT
IMPLANT SPACEOAR SYSTEM 10ML (MISCELLANEOUS) ×3
IV SOD CHL 0.9% 1000ML (IV SOLUTION) ×3 IMPLANT
KIT RM TURNOVER CYSTO AR (KITS) ×3 IMPLANT
PACK CYSTO (CUSTOM PROCEDURE TRAY) ×3 IMPLANT
SELECTSEED 1-125 ×3 IMPLANT
SURGILUBE 2OZ TUBE FLIPTOP (MISCELLANEOUS) ×3 IMPLANT
SUT BONE WAX W31G (SUTURE) ×3 IMPLANT
SYRINGE 10CC LL (SYRINGE) ×3 IMPLANT
UNDERPAD 30X30 (UNDERPADS AND DIAPERS) ×6 IMPLANT
UNDERPAD 30X30 INCONTINENT (UNDERPADS AND DIAPERS) ×6 IMPLANT
WATER STERILE IRR 500ML POUR (IV SOLUTION) ×3 IMPLANT

## 2017-05-08 NOTE — Anesthesia Procedure Notes (Signed)
Procedure Name: LMA Insertion Date/Time: 05/08/2017 8:44 AM Performed by: Justice Rocher, CRNA Pre-anesthesia Checklist: Patient identified, Emergency Drugs available, Suction available and Patient being monitored Patient Re-evaluated:Patient Re-evaluated prior to induction Oxygen Delivery Method: Circle system utilized Preoxygenation: Pre-oxygenation with 100% oxygen Induction Type: IV induction Ventilation: Mask ventilation without difficulty LMA: LMA inserted LMA Size: 5.0 Number of attempts: 1 Airway Equipment and Method: Bite block Placement Confirmation: positive ETCO2 and breath sounds checked- equal and bilateral Tube secured with: Tape Dental Injury: Teeth and Oropharynx as per pre-operative assessment

## 2017-05-08 NOTE — Progress Notes (Signed)
  Radiation Oncology         [phone] ________________________________  Name: [name] MRN: [id number]  Date: 05/08/2017  DOB: 1961/01/20       Prostate Seed Implant  HB:[id number], Wandra Feinstein, MD  No ref. provider found  DIAGNOSIS:  56 year-old gentleman with Stage T2c adenocarcinoma of the prostate with Gleason Score of 4+4, and PSA of 43    ICD-10-CM   1. History of prostate cancer Z85.46 CANCELED: US Guided Needle Placement    CANCELED: [place] Intraoperative    CANCELED: [place] Transrectal Complete    CANCELED: US Guided Needle Placement    CANCELED: [place] Intraoperative    CANCELED: [place] Transrectal Complete  2. History of prostate cancer Z85.46 CANCELED: US Guided Needle Placement    CANCELED: [place] Intraoperative    CANCELED: [place] Transrectal Complete    CANCELED: US Guided Needle Placement    CANCELED: [place] Intraoperative    CANCELED: [place] Transrectal Complete  3. History of prostate cancer Z85.46 CANCELED: US Guided Needle Placement    CANCELED: [place] Intraoperative    CANCELED: [place] Transrectal Complete    CANCELED: US Guided Needle Placement    CANCELED: [place] Intraoperative    CANCELED: [place] Transrectal Complete    PROCEDURE: Insertion of radioactive I-125 seeds into the prostate gland.  RADIATION DOSE: 110 Gy, boost therapy.  TECHNIQUE: Kevin Soto was brought to the operating room with the urologist. He was placed in the dorsolithotomy position. He was catheterized and a rectal tube was inserted. The perineum was shaved, prepped and draped. The ultrasound probe was then introduced into the rectum to see the prostate gland.  TREATMENT DEVICE: A needle grid was attached to the ultrasound probe stand and anchor needles were placed.  3D PLANNING: The prostate was imaged in 3D using a sagittal sweep of the prostate probe. These images were transferred to the planning computer. There, the prostate, urethra and rectum were defined on each axial reconstructed image. Then, the software  created an optimized 3D plan and a few seed positions were adjusted. The quality of the plan was reviewed using [facility] information for the target and the following two organs at risk:  Urethra and Rectum.  Then the accepted plan was uploaded to the seed Selectron afterloading unit.  PROSTATE VOLUME STUDY:  Using transrectal ultrasound the volume of the prostate was verified to be 22.8 cc.  SPECIAL TREATMENT PROCEDURE/SUPERVISION AND HANDLING: The Nucletron FIRST system was used to place the needles under sagittal guidance. A total of 19 needles were used to deposit 59 seeds in the prostate gland. The individual seed activity was 0.287 mCi.  SpaceOAR:  Yes  COMPLEX SIMULATION: At the end of the procedure, an anterior radiograph of the pelvis was obtained to document seed positioning and count. Cystoscopy was performed to check the urethra and bladder.  MICRODOSIMETRY: At the end of the procedure, the patient was emitting 0.095 mR/hr at 1 meter. Accordingly, he was considered safe for hospital discharge.  PLAN: The patient will return to the radiation oncology clinic for post implant CT dosimetry in three weeks.   ________________________________  Sheral Apley Tammi Klippel, M.D.

## 2017-05-08 NOTE — Brief Op Note (Signed)
05/08/2017  10:07 AM  PATIENT:  Kevin Soto  56 y.o. male  PRE-OPERATIVE DIAGNOSIS:  PROSTATE CANCER  POST-OPERATIVE DIAGNOSIS:  PROSTATE CANCER  PROCEDURE:  Procedure(s): RADIOACTIVE SEED IMPLANT/BRACHYTHERAPY IMPLANT (N/A) SPACE OAR INSTILLATION (N/A)  SURGEON:  Surgeon(s) and Role:    * Alexis Frock, MD - Primary    * Tyler Pita, MD - Assisting  PHYSICIAN ASSISTANT:   ASSISTANTS: none   ANESTHESIA:   general  EBL:  0 mL   BLOOD ADMINISTERED:none  DRAINS: none   LOCAL MEDICATIONS USED:  NONE  SPECIMEN:  No Specimen  DISPOSITION OF SPECIMEN:  N/A  COUNTS:  YES  TOURNIQUET:  * No tourniquets in log *  DICTATION: .Other Dictation: Dictation Number [id number]  PLAN OF CARE: Discharge to home after PACU  PATIENT DISPOSITION:  PACU - hemodynamically stable.   Delay start of Pharmacological VTE agent (>24hrs) due to surgical blood loss or risk of bleeding: yes

## 2017-05-08 NOTE — Discharge Instructions (Signed)
1 - You may have urinary urgency (bladder spasms) and bloody urine on / off x few days.This is normal.  2 - Call MD or go to ER for fever >102, severe pain / nausea / vomiting not relieved by medications, or acute change in medical status  Radioactive Seed Implant Home Care Instructions   Activity:    Rest for the remainder of the day.  Do not drive or operate equipment today.  You may resume normal activities in a few days as instructed by your physician, without risk of harmful radiation exposure to those around you, provided you follow the time and distance precautions on the Radiation Oncology Instruction Sheet.   Meals: Drink plenty of lipuids and eat light foods, such as gelatin or soup this evening .  You may return to normal meal plan tomorrow.  Return To Work: You may return to work as instructed by Naval architect.  Special Instruction:   If any seeds are found, use tweezers to pick up seeds and place in a glass container of any kind and bring to your physician's office.  Call your physician if any of these symptoms occur:   Persistent or heavy bleeding  Urine stream diminishes or stops completely after catheter is removed  Fever equal to or greater than 101 degrees F  Cloudy urine with a strong foul odor  Severe pain  You may feel some burning pain and/or hesitancy when you urinate after the catheter is removed.  These symptoms may increase over the next few weeks, but should diminish within forur to six weeks.  Applying moist heat to the lower abdomen or a hot tub bath may help relieve the pain.  If the discomfort becomes severe, please call your physician for additional medications.   Post Anesthesia Home Care Instructions  Activity: Get plenty of rest for the remainder of the day. A responsible individual must stay with you for 24 hours following the procedure.  For the next 24 hours, DO NOT: -Drive a car -Paediatric nurse -Drink alcoholic beverages -Take any  medication unless instructed by your physician -Make any legal decisions or sign important papers.  Meals: Start with liquid foods such as gelatin or soup. Progress to regular foods as tolerated. Avoid greasy, spicy, heavy foods. If nausea and/or vomiting occur, drink only clear liquids until the nausea and/or vomiting subsides. Call your physician if vomiting continues.  Special Instructions/Symptoms: Your throat may feel dry or sore from the anesthesia or the breathing tube placed in your throat during surgery. If this causes discomfort, gargle with warm salt water. The discomfort should disappear within 24 hours.  If you had a scopolamine patch placed behind your ear for the management of post- operative nausea and/or vomiting:  1. The medication in the patch is effective for 72 hours, after which it should be removed.  Wrap patch in a tissue and discard in the trash. Wash hands thoroughly with soap and water. 2. You may remove the patch earlier than 72 hours if you experience unpleasant side effects which may include dry mouth, dizziness or visual disturbances. 3. Avoid touching the patch. Wash your hands with soap and water after contact with the patch.

## 2017-05-08 NOTE — Transfer of Care (Signed)
Immediate Anesthesia Transfer of Care Note  Patient: Kevin Soto  Procedure(s) Performed: Procedure(s) (LRB): RADIOACTIVE SEED IMPLANT/BRACHYTHERAPY IMPLANT (N/A) SPACE OAR INSTILLATION (N/A)  Patient Location: PACU  Anesthesia Type: General  Level of Consciousness: awake, sedated, patient cooperative and responds to stimulation  Airway & Oxygen Therapy: Patient Spontanous Breathing and Patient connected to [place] oxygen  Post-op Assessment: Report given to PACU RN, Post -op Vital signs reviewed and stable and Patient moving all extremities  Post vital signs: Reviewed and stable  Complications: No apparent anesthesia complications

## 2017-05-08 NOTE — H&P (Signed)
Kevin Soto is an 56 y.o. male.    Chief Complaint: Pre-op Prostate Brachytherapy Seed Implantation / Space-OAR  HPI:   1 -High RIsk Prostate Cancer - PSA 43 09/2016 on eval L4 lytic bone lesion by plain film on eval back pain / weight loss, FU MRI however favors only degenerative changes. CT 10/2016 w/o pelvic adenopathy. DRE 10/2016 25gm firm throughout, no large nodules, not fixed. TRUS BX 11/2016 60mL no median lobe and 12/12 cores positive with up to Gleason 8 cancer.   Primary Thearpy: Plan brachyterapy + EBRT + 2 years androen deprivation. First Lupron 02/2017   2 -Stage 4 Chronic Renal Insufficiency - Cr 2.5-3's. CT and renal US 2018 w/o hydro. He is diabetic with glycosuria and proteinuria.   PMH sig for DM2 (A1c 7-8), CVA/Plavix (slowed gate and some mentation only residual deficits, disabled, has held piror). His PCP is Albin Felling MD with [facility].   Today "Kevin Soto" is seen to proceed with prostate brachytherapy seed implantation and SPACE-OAR placement.     Past Medical History:  Diagnosis Date  . Abdominal pain    lower abd  . Abnormal abdominal CT scan    wall thickening  . Anemia   . CAD (coronary artery disease)    cath 8/12:  LM ok, mLAD occluded with trivial collats from right AM and dRCA, mDx (small) 80%, RI 25%, tiny branch of OM 90%, mAM 30-40%, RCA sub-totally occluded, then 80%, EF 40%, inf HK.  He underwent PCI with [name] with placement of a Promus DES to the mRCA x 2  . Chronic kidney disease    stage 3 per chart  . Diabetes mellitus   . Dyslipidemia   . ED (erectile dysfunction)   . Gait instability 02/27/2011  . History of alcohol abuse   . History of stomach ulcers   . History of stroke    evidence of CVA on MRI in past  . Hyperlipidemia   . Hypertension   . Ischemic cardiomyopathy    echo 7/12: EF 40-45%, septal, apical, inf basal HK, mod LVH, mild MR, mild LAE.  EF 55% by echo 2015  . Macular hemorrhage   . Macular hemorrhage   .  Memory change   . Prostate cancer ([place])   . PSA elevation   . Sleep apnea    no c-pap; wife denies  . Stroke [facility]) 2012   gait distrubance; wife denies use of assistive device  . Vision, loss, sudden 02/27/2011  . Wears glasses   . Weight loss     Past Surgical History:  Procedure Laterality Date  . CORONARY ANGIOPLASTY WITH STENT PLACEMENT  2012   2 stents  . REFRACTIVE SURGERY  2012   right and left eye/ per pt only left eye    Family History  Adopted: Yes  Problem Relation Age of Onset  . Heart attack Mother        Died 61  . Heart failure Mother   . Diabetes Mother   . Heart attack Father   . Heart failure Father   . Diabetes Father   . Prostate cancer Father   . Cancer Father        Prostate  . Heart disease Sister        s/p CABG x3 in her mid 74's  . Crohn's disease Sister   . Crohn's disease Other        niece  . Kidney disease Brother    Social History:  reports that  has never smoked. he has never used smokeless tobacco. He reports that he drinks about 2.4 oz of alcohol per week. He reports that he does not use drugs.  Allergies:  Allergies  Allergen Reactions  . Clonidine Derivatives     Dizzy, too sleepy    No medications prior to admission.    No results found for this or any previous visit (from the past 48 hour(s)). No results found.  Review of Systems  Constitutional: Negative.  Negative for chills and fever.  HENT: Negative.   Eyes: Negative.   Respiratory: Negative.   Cardiovascular: Negative.   Gastrointestinal: Negative.   Genitourinary: Negative.  Negative for flank pain and hematuria.  Musculoskeletal: Negative.   Skin: Negative.   Neurological: Negative.   Endo/Heme/Allergies: Negative.   Psychiatric/Behavioral: Negative.     Height 6' (1.829 m), weight 93.9 kg (207 lb). Physical Exam  Constitutional: He appears well-developed.  HENT:  Head: Normocephalic.  Eyes: Pupils are equal, round, and reactive to light.  Neck:  Normal range of motion.  Cardiovascular: Normal rate.  Respiratory: Effort normal.  GI:  Mild truncal obesity, stable.   Genitourinary:  Genitourinary Comments: No CVAT  Musculoskeletal: Normal range of motion.  Neurological: He is alert.  Skin: Skin is warm.  Psychiatric: He has a normal mood and affect.     Assessment/Plan  Proceed as planned with prostate brachytherapy seed implantation and SPACE-OAR. Risks, benefits, alternatives, expected peri-op course discussed previously and reiterated today.   Alexis Frock, MD 05/08/2017, 6:30 AM

## 2017-05-08 NOTE — Anesthesia Postprocedure Evaluation (Signed)
Anesthesia Post Note  Patient: PARMINDER CUPPLES  Procedure(s) Performed: RADIOACTIVE SEED IMPLANT/BRACHYTHERAPY IMPLANT (N/A ) SPACE OAR INSTILLATION (N/A )     Patient location during evaluation: PACU Anesthesia Type: General Level of consciousness: sedated Pain management: pain level controlled Vital Signs Assessment: post-procedure vital signs reviewed and stable Respiratory status: spontaneous breathing and respiratory function stable Cardiovascular status: stable Postop Assessment: no apparent nausea or vomiting Anesthetic complications: no    Last Vitals:  Vitals:   05/08/17 1045 05/08/17 1100  BP: (!) 160/82 (!) 161/83  Pulse: (!) 59 66  Resp: 12 15  Temp:    SpO2: 100% 100%    Last Pain:  Vitals:   05/08/17 0653  TempSrc: Oral                 Kalieb Freeland DANIEL

## 2017-05-09 ENCOUNTER — Encounter (HOSPITAL_BASED_OUTPATIENT_CLINIC_OR_DEPARTMENT_OTHER): Payer: Self-pay | Admitting: Urology

## 2017-05-09 NOTE — Op Note (Signed)
NAME:  Kevin Soto, Kevin Soto                  ACCOUNT NO.:  MEDICAL RECORD NO.:  [id number]  LOCATION:                                 FACILITY:  PHYSICIAN:  Alexis Frock, MD          DATE OF BIRTH:  DATE OF PROCEDURE:  05/08/2017                              OPERATIVE REPORT   DIAGNOSIS:  High-risk adenocarcinoma of the prostate.  PROCEDURES: 1. Brachytherapy seed implantation with ultrasound guidance. 2. Cystoscopy. 3. SpaceOAR perirectal gel matrix injection.  ESTIMATED BLOOD LOSS:  Nil.  COMPLICATION:  None.  SPECIMEN:  None.  DRAINS:  None.  BRACHYTHERAPY PARAMETERS:  59 seeds placed over 19 catheters for prescribed dose of 110 Gy.  FINDINGS: 1. 26 mL prostate by ultrasound. 2. Successful placement of brachytherapy seeds as per plan. 3. No evidence of intraluminal seeds with final cystoscopy. 4. Successful placement of SpaceOAR gentle matrix separating the     anterior rectal wall away from the posterior prostate.  INDICATION:  Mr. Boedecker is a very pleasant 56 year old gentleman with recent history of very high-risk adenocarcinoma of the prostate.  This was clinically localized by metastatic survey.  Options were discussed for management including a curative intent pathway with radiation anterior to the androgen deprivation, which he wished to proceed with. He has already received androgen deprivation.  He presents today for brachytherapy seed implantation for the prostate portion of his radiotherapy.  He is also to receive SpaceOAR gentle matrix today to help minimize local radiation toxicity.  Informed consent was obtained and placed in the medical record.  PROCEDURE IN DETAIL:  The patient being Kevin Soto, was verified. Procedure being brachytherapy seed implantation, SpaceOAR placement and cystoscopy was confirmed.  Procedure was carried out.  Time-out was performed.  Intravenous antibiotics were administered.  General LMA anesthesia was introduced.   The patient was placed into a high lithotomy position.  Sterile field was created by prepping and draping the patient's penis, perineum using iodine.  The stepper transrectal ultrasound with probe was placed per rectum and anchor probe was placed. Transrectal ultrasound performed on planning done per Radiation Oncology note in dosimetry.  Next, brachytherapy seed implantation was performed using the Nucletron device to load 19 catheters, which were placed in the anterior-posterior direction sequentially placing 59 seeds in appropriate position.  Next, the Stepper apparatus was removed as were anchored probes.  The transrectal ultrasound was taken off anterior tension to allow better visualization of the perirectal space and under ultrasound guidance, the SpaceOAR finder needle was placed trans- perineally approximately 7 mm anterior to the anus, and under ultrasound guidance, the tip was navigated to the midgland of the prostate sagittal with the tip being in what appeared to be the fat plane anterior to the rectum and posterior to the prostate.  Small hydrodissection was performed using 3 mL of saline, which corroborated adequate tip placement.  Next, 10 mL of gentle matrix SpaceOAR was injected over 10 seconds, which resulted in excellent placement of the anterior rectal wall away from the area of the posterior prostate.  The transrectal ultrasound apparatus was then completely removed and cystourethroscopy was then performed using a 16-French flexible  cystoscope.  Inspection of the anterior and posterior urethra unremarkable.  Inspection of the urinary bladder revealed no diverticula, calcifications, papillary lesions.  Ureteral orifices were singleton bilaterally.  There was no evidence of intraluminal seeds.  Procedure was then terminated.  The patient tolerated the procedure well.  There were no immediate periprocedural complications.  The patient was taken to the postanesthesia  care unit in stable condition.          ______________________________ Alexis Frock, MD     TM/MEDQ  D:  05/08/2017  T:  05/09/2017  Job:  [id number]

## 2017-05-10 ENCOUNTER — Ambulatory Visit: Payer: Medicare Other | Admitting: Cardiology

## 2017-05-23 ENCOUNTER — Telehealth: Payer: Self-pay | Admitting: *Deleted

## 2017-05-23 NOTE — Telephone Encounter (Signed)
Called patient to remind of appts. for 05-24-17, spoke with patient and he is aware of these appts.

## 2017-05-24 ENCOUNTER — Ambulatory Visit: Payer: Self-pay | Admitting: Radiation Oncology

## 2017-05-24 ENCOUNTER — Ambulatory Visit: Payer: Medicare Other | Admitting: Radiation Oncology

## 2017-05-24 ENCOUNTER — Ambulatory Visit (HOSPITAL_COMMUNITY)
Admission: RE | Admit: 2017-05-24 | Discharge: 2017-05-24 | Disposition: A | Discharge status: Home / Self Care | Payer: Medicare Other | Source: Ambulatory Visit | Attending: Urology | Admitting: Urology

## 2017-05-24 DIAGNOSIS — C61 Malignant neoplasm of prostate: Secondary | ICD-10-CM

## 2017-05-27 ENCOUNTER — Encounter: Payer: Self-pay | Admitting: Medical Oncology

## 2017-05-27 ENCOUNTER — Ambulatory Visit
Admission: RE | Admit: 2017-05-27 | Discharge: 2017-05-27 | Disposition: A | Discharge status: Home / Self Care | Payer: Medicare Other | Source: Ambulatory Visit | Attending: Radiation Oncology | Admitting: Radiation Oncology

## 2017-05-27 ENCOUNTER — Other Ambulatory Visit: Payer: Self-pay

## 2017-05-27 ENCOUNTER — Encounter: Payer: Self-pay | Admitting: Radiation Oncology

## 2017-05-27 DIAGNOSIS — C61 Malignant neoplasm of prostate: Secondary | ICD-10-CM

## 2017-05-27 DIAGNOSIS — Z510 Encounter for antineoplastic radiation therapy: Secondary | ICD-10-CM | POA: Diagnosis not present

## 2017-05-27 NOTE — Progress Notes (Signed)
Per scheduler at [facility] patient is to have lab work on 3/26 at 1000 then, see Dr. Tresa Moore on 4/2 at 1115. Patient informed of these appointments.

## 2017-05-27 NOTE — Progress Notes (Signed)
  Radiation Oncology         [phone] ________________________________  Name: Kevin Soto MRN: [id number]  Date: 05/27/2017  DOB: Jan 30, 1961  COMPLEX SIMULATION NOTE  NARRATIVE:  The patient was brought to the [facility] today following prostate seed implantation approximately one month ago.  Identity was confirmed.  All relevant records and images related to the planned course of therapy were reviewed.  Then, the patient was set-up supine.  CT images were obtained.  The CT images were loaded into the planning software.  Then the prostate and rectum were contoured.  Treatment planning then occurred.  The implanted iodine 125 seeds were identified by the physics staff for projection of radiation distribution  I have requested : 3D Simulation  I have requested a DVH of the following structures: Prostate and rectum.    ________________________________  Sheral Apley Tammi Klippel, M.D.  This document serves as a record of services personally performed by Tyler Pita, MD. It was created on his behalf by Rae Lips, a trained medical scribe. The creation of this record is based on the scribe's personal observations and the provider's statements to them. This document has been checked and approved by the attending provider.

## 2017-05-27 NOTE — Progress Notes (Signed)
  Radiation Oncology         [phone] ________________________________  Name: Kevin Soto MRN: [id number]  Date: 05/27/2017  DOB: 1960/06/27  SIMULATION AND TREATMENT PLANNING NOTE    ICD-10-CM   1. Malignant neoplasm of prostate ([place]) C61     DIAGNOSIS:  57 year-old gentleman with Stage T2c adenocarcinoma of the prostate with Gleason Score of 4+4, and PSA of 43  NARRATIVE:  The patient was brought to the [facility].  Identity was confirmed.  All relevant records and images related to the planned course of therapy were reviewed.  The patient freely provided informed written consent to proceed with treatment after reviewing the details related to the planned course of therapy. The consent form was witnessed and verified by the simulation staff.  Then, the patient was set-up in a stable reproducible supine position for radiation therapy.  A vacuum lock pillow device was custom fabricated to position his legs in a reproducible immobilized position.  Then, I performed a urethrogram under sterile conditions to identify the prostatic apex.  CT images were obtained.  Surface markings were placed.  The CT images were loaded into the planning software.  Then the prostate target and avoidance structures including the rectum, bladder, bowel and hips were contoured.  Treatment planning then occurred.  The radiation prescription was entered and confirmed.  A total of one complex treatment devices were fabricated. I have requested : Intensity Modulated Radiotherapy (IMRT) is medically necessary for this case for the following reason:  Rectal sparing.Marland Kitchen  PLAN:  The patient will receive 45 Gy in 25 fractions of 1.8 Gy, to supplement an up-front prostate seed implant boost of 110 Gy to achieve a total nominal dose of 165 Gy.  ________________________________  Sheral Apley Tammi Klippel, M.D.

## 2017-05-27 NOTE — Progress Notes (Signed)
Radiation Oncology         [phone] ________________________________  Name: Kevin Soto MRN: [id number]  Date of Service: 05/27/2017 DOB: August 18, 1960  Follow-Up Post-Seed  CC: Shela Leff, MD  Alexis Frock, MD  Diagnosis:   57 y.o. gentleman with Stage T2c adenocarcinoma of the prostate with Gleason Score of 4+4, and PSA of 43    ICD-10-CM   1. Malignant neoplasm of prostate ([place]) C61      Narrative:  The patient returns today for follow up since undergoing radioactive seed implant for his prostate cancer. At the time of surgery, he received a total of 59 seeds.  He has undergone repeat pelvic CT in our department today and the initial evaluation of these images reveal successful seed implant quality. His pretreatment IPSS was reviewed and was 5, his reported IPSS score today is 4.     On review of systems, the patient reports that he is doing well overall. He denies any chest pain, shortness of breath, cough, fevers, chills, night sweats, unintended weight changes. He reports that he is having the following urinary symptoms frequency, and intermittency. He denies changes in quality of life as a result.   ALLERGIES:  is allergic to clonidine derivatives.  Meds: Current Outpatient Medications  Medication Sig Dispense Refill  . ACCU-CHEK FASTCLIX LANCETS MISC Use to check blood sugar two times a day 102 each 12  . aspirin EC 81 MG tablet Take 1 tablet (81 mg total) by mouth daily.    Marland Kitchen bismuth subsalicylate (PEPTO-BISMOL) 262 MG chewable tablet Take 2 tablets by mouth twice daily x 14 days 56 tablet 0  . Blood Glucose Monitoring Suppl (ACCU-CHEK GUIDE) w/Device KIT 1 each by Does not apply route 2 (two) times daily. 1 kit 0  . carvedilol (COREG) 25 MG tablet Take 25 mg by mouth 2 (two) times daily with a meal.    . clopidogrel (PLAVIX) 75 MG tablet TAKE 1 TABLET BY MOUTH  DAILY 90 tablet 4  . glipiZIDE (GLUCOTROL) 10 MG tablet Take 1 tablet (10 mg total) by mouth 2  (two) times daily before a meal. 180 tablet 1  . glucose blood (ACCU-CHEK GUIDE) test strip Use as instructed 100 each 12  . hydrochlorothiazide (HYDRODIURIL) 25 MG tablet Take 25 mg by mouth daily.    . nitroGLYCERIN (NITROSTAT) 0.4 MG SL tablet Place 1 tablet (0.4 mg total) under the tongue every 5 (five) minutes as needed. 100 tablet 1  . quinapril (ACCUPRIL) 40 MG tablet Take 40 mg by mouth at bedtime.     . senna-docusate (SENOKOT-S) 8.6-50 MG tablet Take 1 tablet by mouth 2 (two) times daily. While taking strong pain meds to prevent constipation. 20 tablet 0  . tamsulosin (FLOMAX) 0.4 MG CAPS capsule Take 1 capsule (0.4 mg total) by mouth daily as needed. For urinary urgency after prostate radiation 30 capsule 5  . traMADol (ULTRAM) 50 MG tablet Take 1-2 tablets (50-100 mg total) by mouth every 6 (six) hours as needed for moderate pain or severe pain. Post-operatively 20 tablet 0   Current Facility-Administered Medications  Medication Dose Route Frequency Provider Last Rate Last Dose  . 0.9 %  sodium chloride infusion  500 mL Intravenous Continuous Ladene Artist, MD        Physical Findings: BP 138/89 (BP Location: Left Arm, Patient Position: Sitting, Cuff Size: Normal)   Pulse 72   Temp 98.3 F (36.8 C) (Oral)   Resp 18   Wt 205  lb (93 kg)   SpO2 100%   BMI 27.80 kg/m  Pain Assessment Pain Score: 0-No pain/10 In general this is a well appearing African American  male in no acute distress. He's alert and oriented x4 and appropriate throughout the examination. Cardiopulmonary assessment is negative for acute distress and he exhibits normal effort.    Lab Findings: Lab Results  Component Value Date   WBC 8.4 05/02/2017   HGB 11.5 (L) 05/02/2017   HGB 10.9 (L) 05/08/2016   HGB 10.1 (L) 06/25/2013   HCT 34.5 (L) 05/02/2017   HCT 34.3 (L) 05/08/2016   HCT 31.1 (L) 06/25/2013   PLT 282 05/02/2017   PLT 322 05/08/2016    Lab Results  Component Value Date   NA 135  05/02/2017   NA 137 07/10/2016   NA 144 06/25/2013   K 4.5 05/02/2017   K 4.5 06/25/2013   CHLORIDE 106 06/25/2013   CO2 27 05/02/2017   CO2 28 06/25/2013   GLUCOSE 241 (H) 05/02/2017   GLUCOSE 131 06/25/2013   GLUCOSE 261 (H) 08/07/2012   BUN 36 (H) 05/02/2017   BUN 34 (H) 07/10/2016   BUN 21.3 06/25/2013   CREATININE 1.69 (H) 05/02/2017   CREATININE 1.81 (H) 05/02/2015   CREATININE 1.7 (H) 06/25/2013   BILITOT 0.8 05/02/2017   BILITOT 0.28 06/25/2013   ALKPHOS 62 05/02/2017   ALKPHOS 63 06/25/2013   AST 22 05/02/2017   AST 20 06/25/2013   ALT 27 05/02/2017   ALT 40 06/25/2013   PROT 8.0 05/02/2017   PROT 7.1 10/25/2016   PROT 7.4 06/25/2013   ALBUMIN 3.7 05/02/2017   ALBUMIN 3.8 06/25/2013   CALCIUM 9.6 05/02/2017   CALCIUM 9.8 06/25/2013   ANIONGAP 8 05/02/2017    Radiographic Findings: Dg C-arm 1-60 Min-no Report  Result Date: 05/08/2017 Fluoroscopy was utilized by the requesting physician.  No radiographic interpretation.    Impression/Plan: 1. Stage T2c adenocarcinoma of the prostate with Gleason Score of 4+4, and PSA of 43. The patient has done well with tolerating the side effects of radioactive seed implant. We anticipate that his symptoms will continue to improve as the radioactive decay continues. We again reviewed perioperative expectations, and reviewed the rationale for returning to urology in April for his next ADT injection. We will plan to proceed with his 5 week course of XRT to the prostate, and this will begin next Wednesday. We will also send the formal assessment of implant quality based on his CT and pre-seed placement planning to his urologist. He states agreement and understanding.      Carola Rhine, PAC  [email or web address]  Skype  [email or web address]    Page Me   This document serves as a record of services personally performed by Shona Simpson, PA-C. It was created on her behalf by Rae Lips, a trained medical scribe. The creation of  this record is based on the scribe's personal observations and the providers' statements to them. This document has been checked and approved by the attending providers.

## 2017-05-27 NOTE — Progress Notes (Signed)
Patient returns today for post seed follow up. Weight and vitals stable. Denies pain. Pre seed IPSS 4. Post seed IPSS 5. Denies dysuria or hematuria. Reports "a couple" of episodes of leakage. Patient reports he has not seen Dr. Tresa Moore, urologist, since seed implant and is unaware of any upcoming appointments. Patient had MRI to confirm SpaceOar placement on 05/24/17.  BP 138/89 (BP Location: Left Arm, Patient Position: Sitting, Cuff Size: Normal)   Pulse 72   Temp 98.3 F (36.8 C) (Oral)   Resp 18   Wt 205 lb (93 kg)   SpO2 100%   BMI 27.80 kg/m  Wt Readings from Last 3 Encounters:  05/27/17 205 lb (93 kg)  05/08/17 205 lb 12.8 oz (93.4 kg)  05/07/17 203 lb 12.8 oz (92.4 kg)

## 2017-05-28 DIAGNOSIS — Z510 Encounter for antineoplastic radiation therapy: Secondary | ICD-10-CM | POA: Insufficient documentation

## 2017-05-28 DIAGNOSIS — C61 Malignant neoplasm of prostate: Secondary | ICD-10-CM | POA: Insufficient documentation

## 2017-06-03 ENCOUNTER — Encounter: Payer: Self-pay | Admitting: Radiation Oncology

## 2017-06-03 DIAGNOSIS — C61 Malignant neoplasm of prostate: Secondary | ICD-10-CM | POA: Diagnosis not present

## 2017-06-03 DIAGNOSIS — Z510 Encounter for antineoplastic radiation therapy: Secondary | ICD-10-CM | POA: Diagnosis not present

## 2017-06-03 NOTE — Progress Notes (Signed)
  Radiation Oncology         [phone] ________________________________  Name: Kevin Soto MRN: [id number]  Date: 06/03/2017  DOB: Aug 13, 1960  3D Planning Note   Prostate Brachytherapy Post-Implant Dosimetry  Diagnosis: 57 yo man with stage T2c adenocarcinoma of the prostate with Gleason Score of 4+4, and PSA of 43  Narrative: On a previous date, Kevin Soto returned following prostate seed implantation for post implant planning. He underwent CT scan complex simulation to delineate the three-dimensional structures of the pelvis and demonstrate the radiation distribution.  Since that time, the seed localization, and complex isodose planning with dose volume histograms have now been completed.  Results:   Prostate Coverage - The dose of radiation delivered to the 90% or more of the prostate gland (D90) was 100.8% of the prescription dose. This exceeds our goal of greater than 90%. Rectal Sparing - The volume of rectal tissue receiving the prescription dose or higher was 0.0 cc. This falls under our thresholds tolerance of 1.0 cc.  Impression: The prostate seed implant appears to show adequate target coverage and appropriate rectal sparing.  Plan:  The patient will continue to follow with urology for ongoing PSA determinations. I would anticipate a high likelihood for local tumor control with minimal risk for rectal morbidity.  ________________________________  Sheral Apley Tammi Klippel, M.D.

## 2017-06-04 DIAGNOSIS — Z510 Encounter for antineoplastic radiation therapy: Secondary | ICD-10-CM | POA: Diagnosis not present

## 2017-06-04 DIAGNOSIS — C61 Malignant neoplasm of prostate: Secondary | ICD-10-CM | POA: Diagnosis not present

## 2017-06-05 ENCOUNTER — Ambulatory Visit: Payer: Medicare Other

## 2017-06-05 DIAGNOSIS — C61 Malignant neoplasm of prostate: Secondary | ICD-10-CM | POA: Diagnosis not present

## 2017-06-05 DIAGNOSIS — Z510 Encounter for antineoplastic radiation therapy: Secondary | ICD-10-CM | POA: Diagnosis not present

## 2017-06-06 ENCOUNTER — Ambulatory Visit
Admission: RE | Admit: 2017-06-06 | Discharge: 2017-06-06 | Disposition: A | Discharge status: Home / Self Care | Payer: Medicare Other | Source: Ambulatory Visit | Attending: Radiation Oncology | Admitting: Radiation Oncology

## 2017-06-06 DIAGNOSIS — C61 Malignant neoplasm of prostate: Secondary | ICD-10-CM | POA: Diagnosis not present

## 2017-06-06 DIAGNOSIS — Z510 Encounter for antineoplastic radiation therapy: Secondary | ICD-10-CM | POA: Diagnosis not present

## 2017-06-06 NOTE — Progress Notes (Signed)
Pt here for patient teaching.  Pt given Radiation and You booklet.  Reviewed areas of pertinence such as diarrhea, fatigue, nausea and vomiting, skin changes and urinary and bladder changes . Pt able to give teach back of have Imodium on hand and drink plenty of water,. Pt verbalizes understanding of information given and will contact nursing with any questions or concerns.     [email or web address]/index

## 2017-06-07 ENCOUNTER — Ambulatory Visit
Admission: RE | Admit: 2017-06-07 | Discharge: 2017-06-07 | Disposition: A | Discharge status: Home / Self Care | Payer: Medicare Other | Source: Ambulatory Visit | Attending: Radiation Oncology | Admitting: Radiation Oncology

## 2017-06-07 DIAGNOSIS — C61 Malignant neoplasm of prostate: Secondary | ICD-10-CM | POA: Diagnosis not present

## 2017-06-07 DIAGNOSIS — Z510 Encounter for antineoplastic radiation therapy: Secondary | ICD-10-CM | POA: Diagnosis not present

## 2017-06-10 ENCOUNTER — Ambulatory Visit
Admission: RE | Admit: 2017-06-10 | Discharge: 2017-06-10 | Disposition: A | Discharge status: Home / Self Care | Payer: Medicare Other | Source: Ambulatory Visit | Attending: Radiation Oncology | Admitting: Radiation Oncology

## 2017-06-10 DIAGNOSIS — Z510 Encounter for antineoplastic radiation therapy: Secondary | ICD-10-CM | POA: Diagnosis not present

## 2017-06-10 DIAGNOSIS — C61 Malignant neoplasm of prostate: Secondary | ICD-10-CM | POA: Diagnosis not present

## 2017-06-11 ENCOUNTER — Ambulatory Visit
Admission: RE | Admit: 2017-06-11 | Discharge: 2017-06-11 | Disposition: A | Discharge status: Home / Self Care | Payer: Medicare Other | Source: Ambulatory Visit | Attending: Radiation Oncology | Admitting: Radiation Oncology

## 2017-06-11 DIAGNOSIS — C61 Malignant neoplasm of prostate: Secondary | ICD-10-CM | POA: Diagnosis not present

## 2017-06-11 DIAGNOSIS — Z510 Encounter for antineoplastic radiation therapy: Secondary | ICD-10-CM | POA: Diagnosis not present

## 2017-06-12 ENCOUNTER — Ambulatory Visit
Admission: RE | Admit: 2017-06-12 | Discharge: 2017-06-12 | Disposition: A | Discharge status: Home / Self Care | Payer: Medicare Other | Source: Ambulatory Visit | Attending: Radiation Oncology | Admitting: Radiation Oncology

## 2017-06-12 ENCOUNTER — Telehealth: Payer: Self-pay | Admitting: Internal Medicine

## 2017-06-12 ENCOUNTER — Other Ambulatory Visit: Payer: Self-pay | Admitting: *Deleted

## 2017-06-12 DIAGNOSIS — C61 Malignant neoplasm of prostate: Secondary | ICD-10-CM | POA: Diagnosis not present

## 2017-06-12 DIAGNOSIS — E1149 Type 2 diabetes mellitus with other diabetic neurological complication: Secondary | ICD-10-CM

## 2017-06-12 DIAGNOSIS — Z510 Encounter for antineoplastic radiation therapy: Secondary | ICD-10-CM | POA: Diagnosis not present

## 2017-06-12 MED ORDER — GLIPIZIDE 10 MG PO TABS: 10.0000 mg | ORAL_TABLET | Freq: Two times a day (BID) | ORAL | 0 refills | Status: DC

## 2017-06-12 NOTE — Telephone Encounter (Signed)
Patient wife is requesting a refill on glipizice 10mg  , they would like it  Rush to pharmacy for pickup [place]

## 2017-06-13 ENCOUNTER — Ambulatory Visit
Admission: RE | Admit: 2017-06-13 | Discharge: 2017-06-13 | Disposition: A | Discharge status: Home / Self Care | Payer: Medicare Other | Source: Ambulatory Visit | Attending: Radiation Oncology | Admitting: Radiation Oncology

## 2017-06-13 DIAGNOSIS — Z510 Encounter for antineoplastic radiation therapy: Secondary | ICD-10-CM | POA: Diagnosis not present

## 2017-06-13 DIAGNOSIS — C61 Malignant neoplasm of prostate: Secondary | ICD-10-CM | POA: Diagnosis not present

## 2017-06-14 ENCOUNTER — Ambulatory Visit
Admission: RE | Admit: 2017-06-14 | Discharge: 2017-06-14 | Disposition: A | Discharge status: Home / Self Care | Payer: Medicare Other | Source: Ambulatory Visit | Attending: Radiation Oncology | Admitting: Radiation Oncology

## 2017-06-14 ENCOUNTER — Telehealth: Payer: Self-pay | Admitting: *Deleted

## 2017-06-14 DIAGNOSIS — C61 Malignant neoplasm of prostate: Secondary | ICD-10-CM | POA: Diagnosis not present

## 2017-06-14 DIAGNOSIS — Z510 Encounter for antineoplastic radiation therapy: Secondary | ICD-10-CM | POA: Diagnosis not present

## 2017-06-14 NOTE — Telephone Encounter (Signed)
Please advise patient to keep taking Atorvastatin as it was not stopped by me. Per chart review, patient's previous PCP Dr. Arcelia Jew has stopped Simvastatin and started him on Atorvastatin instead. He was seen by cardiology in December and they wanted him to continue to Atorvastatin. Thanks.

## 2017-06-14 NOTE — Telephone Encounter (Signed)
Simvastatin was discontinued previously. Patient should be taking Lipitor 40 mg daily. Please ask him if he has this medication at home, if not I can send a prescription to his pharmacy. Thanks.

## 2017-06-14 NOTE — Telephone Encounter (Signed)
Fax from  [facility] - requesting refill on Simvastatin; not on current medication list. ?discontinued. Thanks

## 2017-06-14 NOTE — Telephone Encounter (Signed)
Talked to pt's wife - she named all meds he's taking and cholesterol med was not mentioned. Stated she had written down, Atorvastatin was stopped in June of last year by Dr Marlowe Sax. But she will have pt to re-start med. And will keep appt on 2/12 and bring all medications. No refill needed.

## 2017-06-17 ENCOUNTER — Ambulatory Visit
Admission: RE | Admit: 2017-06-17 | Discharge: 2017-06-17 | Disposition: A | Discharge status: Home / Self Care | Payer: Medicare Other | Source: Ambulatory Visit | Attending: Radiation Oncology | Admitting: Radiation Oncology

## 2017-06-17 DIAGNOSIS — C61 Malignant neoplasm of prostate: Secondary | ICD-10-CM | POA: Diagnosis not present

## 2017-06-17 DIAGNOSIS — Z510 Encounter for antineoplastic radiation therapy: Secondary | ICD-10-CM | POA: Diagnosis not present

## 2017-06-18 ENCOUNTER — Ambulatory Visit
Admission: RE | Admit: 2017-06-18 | Discharge: 2017-06-18 | Disposition: A | Discharge status: Home / Self Care | Payer: Medicare Other | Source: Ambulatory Visit | Attending: Radiation Oncology | Admitting: Radiation Oncology

## 2017-06-18 DIAGNOSIS — Z510 Encounter for antineoplastic radiation therapy: Secondary | ICD-10-CM | POA: Diagnosis not present

## 2017-06-18 DIAGNOSIS — C61 Malignant neoplasm of prostate: Secondary | ICD-10-CM | POA: Diagnosis not present

## 2017-06-19 ENCOUNTER — Ambulatory Visit
Admission: RE | Admit: 2017-06-19 | Discharge: 2017-06-19 | Disposition: A | Discharge status: Home / Self Care | Payer: Medicare Other | Source: Ambulatory Visit | Attending: Radiation Oncology | Admitting: Radiation Oncology

## 2017-06-19 DIAGNOSIS — C61 Malignant neoplasm of prostate: Secondary | ICD-10-CM | POA: Diagnosis not present

## 2017-06-19 DIAGNOSIS — Z510 Encounter for antineoplastic radiation therapy: Secondary | ICD-10-CM | POA: Diagnosis not present

## 2017-06-20 ENCOUNTER — Ambulatory Visit
Admission: RE | Admit: 2017-06-20 | Discharge: 2017-06-20 | Disposition: A | Discharge status: Home / Self Care | Payer: Medicare Other | Source: Ambulatory Visit | Attending: Radiation Oncology | Admitting: Radiation Oncology

## 2017-06-20 DIAGNOSIS — C61 Malignant neoplasm of prostate: Secondary | ICD-10-CM | POA: Diagnosis not present

## 2017-06-20 DIAGNOSIS — Z510 Encounter for antineoplastic radiation therapy: Secondary | ICD-10-CM | POA: Diagnosis not present

## 2017-06-21 ENCOUNTER — Ambulatory Visit
Admission: RE | Admit: 2017-06-21 | Discharge: 2017-06-21 | Disposition: A | Discharge status: Home / Self Care | Payer: Medicare Other | Source: Ambulatory Visit | Attending: Radiation Oncology | Admitting: Radiation Oncology

## 2017-06-21 ENCOUNTER — Telehealth: Payer: Self-pay | Admitting: *Deleted

## 2017-06-21 DIAGNOSIS — C61 Malignant neoplasm of prostate: Secondary | ICD-10-CM | POA: Diagnosis not present

## 2017-06-21 DIAGNOSIS — Z510 Encounter for antineoplastic radiation therapy: Secondary | ICD-10-CM | POA: Diagnosis not present

## 2017-06-21 MED ORDER — ATORVASTATIN CALCIUM 40 MG PO TABS: 40.0000 mg | ORAL_TABLET | Freq: Every day | ORAL | 1 refills | Status: DC

## 2017-06-21 NOTE — Telephone Encounter (Signed)
Prescription has been sent to the pharmacy.  Thanks.

## 2017-06-21 NOTE — Telephone Encounter (Signed)
Call from [facility]  - pt needs refill on his cholesterol medication. Thanks

## 2017-06-24 ENCOUNTER — Ambulatory Visit
Admission: RE | Admit: 2017-06-24 | Discharge: 2017-06-24 | Disposition: A | Discharge status: Home / Self Care | Payer: Medicare Other | Source: Ambulatory Visit | Attending: Radiation Oncology | Admitting: Radiation Oncology

## 2017-06-24 DIAGNOSIS — C61 Malignant neoplasm of prostate: Secondary | ICD-10-CM | POA: Diagnosis not present

## 2017-06-24 DIAGNOSIS — Z510 Encounter for antineoplastic radiation therapy: Secondary | ICD-10-CM | POA: Diagnosis not present

## 2017-06-25 ENCOUNTER — Ambulatory Visit
Admission: RE | Admit: 2017-06-25 | Discharge: 2017-06-25 | Disposition: A | Discharge status: Home / Self Care | Payer: Medicare Other | Source: Ambulatory Visit | Attending: Radiation Oncology | Admitting: Radiation Oncology

## 2017-06-25 DIAGNOSIS — C61 Malignant neoplasm of prostate: Secondary | ICD-10-CM | POA: Diagnosis not present

## 2017-06-25 DIAGNOSIS — Z510 Encounter for antineoplastic radiation therapy: Secondary | ICD-10-CM | POA: Diagnosis not present

## 2017-06-26 ENCOUNTER — Ambulatory Visit: Payer: Medicare Other | Admitting: Podiatry

## 2017-06-26 ENCOUNTER — Ambulatory Visit
Admission: RE | Admit: 2017-06-26 | Discharge: 2017-06-26 | Disposition: A | Discharge status: Home / Self Care | Payer: Medicare Other | Source: Ambulatory Visit | Attending: Radiation Oncology | Admitting: Radiation Oncology

## 2017-06-26 DIAGNOSIS — Z510 Encounter for antineoplastic radiation therapy: Secondary | ICD-10-CM | POA: Diagnosis not present

## 2017-06-26 DIAGNOSIS — C61 Malignant neoplasm of prostate: Secondary | ICD-10-CM | POA: Diagnosis not present

## 2017-06-27 ENCOUNTER — Ambulatory Visit
Admission: RE | Admit: 2017-06-27 | Discharge: 2017-06-27 | Disposition: A | Discharge status: Home / Self Care | Payer: Medicare Other | Source: Ambulatory Visit | Attending: Radiation Oncology | Admitting: Radiation Oncology

## 2017-06-27 DIAGNOSIS — Z510 Encounter for antineoplastic radiation therapy: Secondary | ICD-10-CM | POA: Diagnosis not present

## 2017-06-27 DIAGNOSIS — R3915 Urgency of urination: Secondary | ICD-10-CM | POA: Diagnosis not present

## 2017-06-27 DIAGNOSIS — C61 Malignant neoplasm of prostate: Secondary | ICD-10-CM | POA: Diagnosis not present

## 2017-06-28 ENCOUNTER — Ambulatory Visit
Admission: RE | Admit: 2017-06-28 | Discharge: 2017-06-28 | Disposition: A | Discharge status: Home / Self Care | Payer: Medicare Other | Source: Ambulatory Visit | Attending: Radiation Oncology | Admitting: Radiation Oncology

## 2017-06-28 DIAGNOSIS — Z510 Encounter for antineoplastic radiation therapy: Secondary | ICD-10-CM | POA: Diagnosis not present

## 2017-06-28 DIAGNOSIS — C61 Malignant neoplasm of prostate: Secondary | ICD-10-CM | POA: Diagnosis not present

## 2017-07-01 ENCOUNTER — Ambulatory Visit
Admission: RE | Admit: 2017-07-01 | Discharge: 2017-07-01 | Disposition: A | Discharge status: Home / Self Care | Payer: Medicare Other | Source: Ambulatory Visit | Attending: Radiation Oncology | Admitting: Radiation Oncology

## 2017-07-01 DIAGNOSIS — Z510 Encounter for antineoplastic radiation therapy: Secondary | ICD-10-CM | POA: Diagnosis not present

## 2017-07-01 DIAGNOSIS — C61 Malignant neoplasm of prostate: Secondary | ICD-10-CM | POA: Diagnosis not present

## 2017-07-02 ENCOUNTER — Encounter (INDEPENDENT_AMBULATORY_CARE_PROVIDER_SITE_OTHER): Payer: Self-pay

## 2017-07-02 ENCOUNTER — Other Ambulatory Visit: Payer: Self-pay

## 2017-07-02 ENCOUNTER — Encounter: Payer: Self-pay | Admitting: Internal Medicine

## 2017-07-02 ENCOUNTER — Ambulatory Visit (INDEPENDENT_AMBULATORY_CARE_PROVIDER_SITE_OTHER): Payer: Medicare Other | Admitting: Internal Medicine

## 2017-07-02 ENCOUNTER — Ambulatory Visit
Admission: RE | Admit: 2017-07-02 | Discharge: 2017-07-02 | Disposition: A | Discharge status: Home / Self Care | Payer: Medicare Other | Source: Ambulatory Visit | Attending: Radiation Oncology | Admitting: Radiation Oncology

## 2017-07-02 VITALS — BP 150/84 | HR 63 | Temp 98.4°F | Ht 72.0 in | Wt 205.2 lb

## 2017-07-02 DIAGNOSIS — N183 Chronic kidney disease, stage 3 (moderate): Secondary | ICD-10-CM | POA: Diagnosis not present

## 2017-07-02 DIAGNOSIS — Z23 Encounter for immunization: Secondary | ICD-10-CM

## 2017-07-02 DIAGNOSIS — I10 Essential (primary) hypertension: Principal | ICD-10-CM

## 2017-07-02 DIAGNOSIS — C61 Malignant neoplasm of prostate: Secondary | ICD-10-CM | POA: Diagnosis not present

## 2017-07-02 DIAGNOSIS — Z79899 Other long term (current) drug therapy: Secondary | ICD-10-CM | POA: Diagnosis not present

## 2017-07-02 DIAGNOSIS — Z8669 Personal history of other diseases of the nervous system and sense organs: Secondary | ICD-10-CM

## 2017-07-02 DIAGNOSIS — I129 Hypertensive chronic kidney disease with stage 1 through stage 4 chronic kidney disease, or unspecified chronic kidney disease: Secondary | ICD-10-CM | POA: Diagnosis not present

## 2017-07-02 DIAGNOSIS — E1122 Type 2 diabetes mellitus with diabetic chronic kidney disease: Secondary | ICD-10-CM

## 2017-07-02 DIAGNOSIS — Z7984 Long term (current) use of oral hypoglycemic drugs: Secondary | ICD-10-CM

## 2017-07-02 DIAGNOSIS — Z510 Encounter for antineoplastic radiation therapy: Secondary | ICD-10-CM | POA: Diagnosis not present

## 2017-07-02 DIAGNOSIS — Z0000 Encounter for general adult medical examination without abnormal findings: Secondary | ICD-10-CM

## 2017-07-02 DIAGNOSIS — E1149 Type 2 diabetes mellitus with other diabetic neurological complication: Secondary | ICD-10-CM | POA: Diagnosis not present

## 2017-07-02 DIAGNOSIS — Z8639 Personal history of other endocrine, nutritional and metabolic disease: Secondary | ICD-10-CM

## 2017-07-02 DIAGNOSIS — E1159 Type 2 diabetes mellitus with other circulatory complications: Secondary | ICD-10-CM

## 2017-07-02 LAB — GLUCOSE, CAPILLARY: Glucose-Capillary: 228 mg/dL — ABNORMAL HIGH (ref 65–99)

## 2017-07-02 LAB — POCT GLYCOSYLATED HEMOGLOBIN (HGB A1C): Hemoglobin A1C: 9.3

## 2017-07-02 MED ORDER — LISINOPRIL 10 MG PO TABS: 10.0000 mg | ORAL_TABLET | Freq: Every day | ORAL | 0 refills | Status: DC

## 2017-07-02 NOTE — Assessment & Plan Note (Signed)
Lab Results  Component Value Date   HGBA1C 9.3 07/02/2017   HGBA1C 7.3 09/18/2016   HGBA1C 8.2 05/08/2016     Assessment: Diabetes control:  Controlled Comments: Currently taking glipizide 10 mg twice daily.  Denies having any symptoms of hypoglycemia.  Kevin Soto has a history of diabetic retinopathy.  Patient will schedule an appointment soon to see Dr. Tye Savoy at [facility].  Foot exam was done at this visit.  Plan: Medications: Continue glipizide.  A1c result came back after patient left the clinic.  I tried calling him but was not able to reach him over the phone. Will try calling again to discuss starting him on metformin 500 mg twice daily. Other plans: Repeat A1c in 3 months

## 2017-07-02 NOTE — Patient Instructions (Addendum)
Kevin Soto it was nice seeing you today.  -Start taking lisinopril 10 mg once daily  -Continue taking hydrochlorothiazide and Coreg as instructed for high blood pressure  -Continue taking glipizide as instructed   FOLLOW-UP INSTRUCTIONS When: In 4 weeks For: Blood pressure recheck and labs What to bring: Your medications

## 2017-07-02 NOTE — Progress Notes (Signed)
   CC: Follow-up of diabetes and hypertension.  HPI:  [name] is a 57 y.o. male with a past medical history of conditions listed below presenting to the clinic for a follow-up of diabetes and hypertension. Please see problem based charting for the status of the patient's current and chronic medical conditions.   Past Medical History:  Diagnosis Date  . Abdominal pain    lower abd  . Abnormal abdominal CT scan    wall thickening  . Anemia   . CAD (coronary artery disease)    cath 8/12:  LM ok, mLAD occluded with trivial collats from right AM and dRCA, mDx (small) 80%, RI 25%, tiny branch of OM 90%, mAM 30-40%, RCA sub-totally occluded, then 80%, EF 40%, inf HK.  He underwent PCI with [name] with placement of a Promus DES to the mRCA x 2  . Chronic kidney disease    stage 3 per chart  . Diabetes mellitus   . Dyslipidemia   . ED (erectile dysfunction)   . Gait instability 02/27/2011  . History of alcohol abuse   . History of stomach ulcers   . History of stroke    evidence of CVA on MRI in past  . Hyperlipidemia   . Hypertension   . Ischemic cardiomyopathy    echo 7/12: EF 40-45%, septal, apical, inf basal HK, mod LVH, mild MR, mild LAE.  EF 55% by echo 2015  . Macular hemorrhage   . Macular hemorrhage   . Memory change   . Prostate cancer ([place])   . PSA elevation   . Sleep apnea    no c-pap; wife denies  . Stroke [facility]) 2012   gait distrubance; wife denies use of assistive device  . Vision, loss, sudden 02/27/2011  . Wears glasses   . Weight loss    Review of Systems: Pertinent positives mentioned in HPI. Remainder of all ROS negative.   Physical Exam:  Vitals:   07/02/17 1436 07/02/17 1553  BP: (!) 147/72 (!) 150/84  Pulse: 61 63  Temp: 98.4 F (36.9 C)   TempSrc: Oral   SpO2: 100%   Weight: 205 lb 3.2 oz (93.1 kg)   Height: 6' (1.829 m)    Physical Exam  Constitutional: He is oriented to person, place, and time. No distress.  HENT:  Head:  Normocephalic and atraumatic.  Mouth/Throat: Oropharynx is clear and moist.  Eyes: Right eye exhibits no discharge. Left eye exhibits no discharge.  Cardiovascular: Normal rate, regular rhythm and intact distal pulses.  Pulmonary/Chest: Effort normal and breath sounds normal. No respiratory distress. He has no wheezes. He has no rales.  Abdominal: Soft. Bowel sounds are normal. He exhibits no distension. There is no tenderness.  Musculoskeletal: He exhibits no edema.  Neurological: He is alert and oriented to person, place, and time.  Skin: Skin is warm and dry.    Assessment & Plan:   See Encounters Tab for problem based charting.  Patient discussed with Dr. Evette Doffing

## 2017-07-02 NOTE — Assessment & Plan Note (Signed)
Stage T2cadenocarcinoma of the prostate.  Currently being followed by radiation oncology (Dr. Tammi Klippel) urology (Dr. Tresa Moore).

## 2017-07-02 NOTE — Assessment & Plan Note (Signed)
Pneumococcal 23 valent vaccine administered at this visit.

## 2017-07-02 NOTE — Assessment & Plan Note (Signed)
BP Readings from Last 3 Encounters:  07/02/17 (!) 150/84  05/27/17 138/89  05/08/17 (!) 152/81    Lab Results  Component Value Date   NA 135 05/02/2017   K 4.5 05/02/2017   CREATININE 1.69 (H) 05/02/2017    Assessment: Blood pressure control:  Above goal Comments: Currently taking hydrochlorothiazide 25 mg daily and Coreg 25 mg twice daily.  He was previously on quinapril was discontinued last year due to hyperkalemia and elevation in his creatinine in the setting of chronic kidney disease.  Repeat labs from December 2018 showing normal potassium and GFR 50.  Plan: Start low-dose ACE inhibitor (lisinopril 10 mg daily) due to its renal protective effect in the setting of CKD and diabetes.  Continue hydrochlorothiazide and Coreg.  Advised patient to return to the clinic in 1 month for blood pressure recheck.  BMP needs to be repeated at that visit.

## 2017-07-03 ENCOUNTER — Ambulatory Visit
Admission: RE | Admit: 2017-07-03 | Discharge: 2017-07-03 | Disposition: A | Discharge status: Home / Self Care | Payer: Medicare Other | Source: Ambulatory Visit | Attending: Radiation Oncology | Admitting: Radiation Oncology

## 2017-07-03 DIAGNOSIS — Z510 Encounter for antineoplastic radiation therapy: Secondary | ICD-10-CM | POA: Diagnosis not present

## 2017-07-03 DIAGNOSIS — C61 Malignant neoplasm of prostate: Secondary | ICD-10-CM | POA: Diagnosis not present

## 2017-07-03 NOTE — Progress Notes (Signed)
Internal Medicine Clinic Attending  Case discussed with Dr. Rathoreat the time of the visit. We reviewed the resident's history and exam and pertinent patient test results. I agree with the assessment, diagnosis, and plan of care documented in the resident's note.  

## 2017-07-04 ENCOUNTER — Ambulatory Visit
Admission: RE | Admit: 2017-07-04 | Discharge: 2017-07-04 | Disposition: A | Discharge status: Home / Self Care | Payer: Medicare Other | Source: Ambulatory Visit | Attending: Radiation Oncology | Admitting: Radiation Oncology

## 2017-07-04 DIAGNOSIS — Z510 Encounter for antineoplastic radiation therapy: Secondary | ICD-10-CM | POA: Diagnosis not present

## 2017-07-04 DIAGNOSIS — C61 Malignant neoplasm of prostate: Secondary | ICD-10-CM | POA: Diagnosis not present

## 2017-07-04 MED ORDER — METFORMIN HCL 500 MG PO TABS: 500.0000 mg | ORAL_TABLET | Freq: Two times a day (BID) | ORAL | 1 refills | Status: DC

## 2017-07-04 NOTE — Addendum Note (Signed)
Addended by: Shela Leff on: 07/04/2017 09:37 AM   Modules accepted: Orders

## 2017-07-05 ENCOUNTER — Ambulatory Visit
Admission: RE | Admit: 2017-07-05 | Discharge: 2017-07-05 | Disposition: A | Discharge status: Home / Self Care | Payer: Medicare Other | Source: Ambulatory Visit | Attending: Radiation Oncology | Admitting: Radiation Oncology

## 2017-07-05 DIAGNOSIS — Z510 Encounter for antineoplastic radiation therapy: Secondary | ICD-10-CM | POA: Diagnosis not present

## 2017-07-05 DIAGNOSIS — C61 Malignant neoplasm of prostate: Secondary | ICD-10-CM | POA: Diagnosis not present

## 2017-07-08 ENCOUNTER — Ambulatory Visit
Admission: RE | Admit: 2017-07-08 | Discharge: 2017-07-08 | Disposition: A | Discharge status: Home / Self Care | Payer: Medicare Other | Source: Ambulatory Visit | Attending: Radiation Oncology | Admitting: Radiation Oncology

## 2017-07-08 DIAGNOSIS — Z510 Encounter for antineoplastic radiation therapy: Secondary | ICD-10-CM | POA: Diagnosis not present

## 2017-07-08 DIAGNOSIS — C61 Malignant neoplasm of prostate: Secondary | ICD-10-CM | POA: Diagnosis not present

## 2017-07-09 ENCOUNTER — Ambulatory Visit
Admission: RE | Admit: 2017-07-09 | Discharge: 2017-07-09 | Disposition: A | Discharge status: Home / Self Care | Payer: Medicare Other | Source: Ambulatory Visit | Attending: Radiation Oncology | Admitting: Radiation Oncology

## 2017-07-09 ENCOUNTER — Encounter: Payer: Self-pay | Admitting: Radiation Oncology

## 2017-07-09 DIAGNOSIS — Z510 Encounter for antineoplastic radiation therapy: Secondary | ICD-10-CM | POA: Diagnosis not present

## 2017-07-09 DIAGNOSIS — C61 Malignant neoplasm of prostate: Secondary | ICD-10-CM | POA: Diagnosis not present

## 2017-07-10 NOTE — Progress Notes (Signed)
  Radiation Oncology         (336) [phone] ________________________________  Name: Kevin Soto MRN: [id number]  Date: 07/09/2017  DOB: 1961/01/10  End of Treatment Note  Diagnosis:   57 year-old gentleman with Stage T2c adenocarcinoma of the prostate with Gleason Score of 4+4, and PSA of 43     Indication for treatment:  Curative, Definitive Radiotherapy       Radiation treatment dates:    1. 05/08/2017: Insertion of radioactive I-125 seeds into the prostate gland; 110 Gy, boost therapy. 2. Prostate IMRT:  05/26/2017 to 07/09/2017.  Site/dose: The patient received 45 Gy in 25 fractions of 1.8 Gy, to supplement an up-front prostate seed implant boost of 110 Gy to achieve a total nominal dose of 155 Gy.  Beams/energy:    1. Prostate brachytherapy:  Insertion of radioactive I-125 seeds into the prostate gland; 110 Gy, boost therapy.  2. The patient was treated with IMRT using volumetric arc therapy delivering 6 MV X-rays to clockwise and counterclockwise circumferential arcs with a 90 degree collimator offset to avoid dose scalloping.  Image guidance was performed with daily cone beam CT prior to each fraction to align to gold markers in the prostate and assure proper bladder and rectal fill volumes.  Immobilization was achieved with BodyFix custom mold.  Narrative: The patient tolerated radiation treatment relatively well.   He experienced some minor urinary irritation including, some leakage and nocturia x2. He denied any fatigue, urgency, dysuria, or hematuria. He felt like he was able to empty his bladder well on voiding and maintained a strong stream. He denied any bowel issues.  Plan: The patient has completed radiation treatment. He will return to radiation oncology clinic for routine followup in one month. I advised him to call or return sooner if he has any questions or concerns related to his recovery or treatment. ________________________________  Sheral Apley. Tammi Klippel,  M.D.  This document serves as a record of services personally performed by Tyler Pita, MD. It was created on his behalf by Arlyce Harman, a trained medical scribe. The creation of this record is based on the scribe's personal observations and the provider's statements to them. This document has been checked and approved by the attending provider.

## 2017-07-24 ENCOUNTER — Telehealth: Payer: Self-pay | Admitting: Internal Medicine

## 2017-07-24 NOTE — Telephone Encounter (Signed)
Patietn wife requesting Mamie

## 2017-07-24 NOTE — Telephone Encounter (Signed)
Attempted to reach patient/wife. Number listed for patient is incorrect. Wife's line busy X 2. Will make another attempt later. Hubbard Hartshorn, RN, BSN

## 2017-07-24 NOTE — Telephone Encounter (Signed)
Another attempt made to contact patient/wife. Line remains busy. Hubbard Hartshorn, RN, BSN

## 2017-07-26 ENCOUNTER — Ambulatory Visit: Payer: Medicare Other | Admitting: Podiatry

## 2017-07-26 NOTE — Telephone Encounter (Signed)
Questions about meds. Please call back.  

## 2017-07-30 ENCOUNTER — Other Ambulatory Visit: Payer: Self-pay

## 2017-07-30 ENCOUNTER — Ambulatory Visit (INDEPENDENT_AMBULATORY_CARE_PROVIDER_SITE_OTHER): Payer: Medicare Other | Admitting: Internal Medicine

## 2017-07-30 ENCOUNTER — Encounter: Payer: Self-pay | Admitting: Internal Medicine

## 2017-07-30 VITALS — BP 128/76 | HR 68 | Temp 98.4°F | Ht 72.0 in | Wt 212.8 lb

## 2017-07-30 DIAGNOSIS — E1140 Type 2 diabetes mellitus with diabetic neuropathy, unspecified: Secondary | ICD-10-CM | POA: Diagnosis not present

## 2017-07-30 DIAGNOSIS — Z79899 Other long term (current) drug therapy: Secondary | ICD-10-CM | POA: Diagnosis not present

## 2017-07-30 DIAGNOSIS — Z7984 Long term (current) use of oral hypoglycemic drugs: Secondary | ICD-10-CM

## 2017-07-30 DIAGNOSIS — E1122 Type 2 diabetes mellitus with diabetic chronic kidney disease: Secondary | ICD-10-CM

## 2017-07-30 DIAGNOSIS — Z0000 Encounter for general adult medical examination without abnormal findings: Secondary | ICD-10-CM

## 2017-07-30 DIAGNOSIS — N183 Chronic kidney disease, stage 3 unspecified: Secondary | ICD-10-CM

## 2017-07-30 DIAGNOSIS — I129 Hypertensive chronic kidney disease with stage 1 through stage 4 chronic kidney disease, or unspecified chronic kidney disease: Secondary | ICD-10-CM | POA: Diagnosis not present

## 2017-07-30 DIAGNOSIS — I10 Essential (primary) hypertension: Principal | ICD-10-CM

## 2017-07-30 DIAGNOSIS — E1159 Type 2 diabetes mellitus with other circulatory complications: Secondary | ICD-10-CM

## 2017-07-30 DIAGNOSIS — E1149 Type 2 diabetes mellitus with other diabetic neurological complication: Secondary | ICD-10-CM

## 2017-07-30 LAB — GLUCOSE, CAPILLARY: Glucose-Capillary: 269 mg/dL — ABNORMAL HIGH (ref 65–99)

## 2017-07-30 MED ORDER — LISINOPRIL 20 MG PO TABS: 20.0000 mg | ORAL_TABLET | Freq: Every day | ORAL | 0 refills | Status: DC

## 2017-07-30 NOTE — Patient Instructions (Signed)
Ms. Weinkauf it was nice seeing you today.  For high blood pressure:  -Stop taking spironolactone  -Dose of lisinopril has been increased: Take 20 mg once daily  -Continue taking hydrochlorothiazide 25 mg once daily  -Continue taking Coreg 25 mg twice daily   For diabetes:  -Continue taking glipizide 10 mg twice daily  -I have referred you to [name] to discuss diet and nutrition   Please return for a follow-up in 2 months.

## 2017-07-31 DIAGNOSIS — N2581 Secondary hyperparathyroidism of renal origin: Secondary | ICD-10-CM | POA: Diagnosis not present

## 2017-07-31 DIAGNOSIS — R809 Proteinuria, unspecified: Secondary | ICD-10-CM | POA: Diagnosis not present

## 2017-07-31 DIAGNOSIS — E1122 Type 2 diabetes mellitus with diabetic chronic kidney disease: Secondary | ICD-10-CM | POA: Diagnosis not present

## 2017-07-31 DIAGNOSIS — D631 Anemia in chronic kidney disease: Secondary | ICD-10-CM | POA: Diagnosis not present

## 2017-07-31 DIAGNOSIS — I129 Hypertensive chronic kidney disease with stage 1 through stage 4 chronic kidney disease, or unspecified chronic kidney disease: Secondary | ICD-10-CM | POA: Diagnosis not present

## 2017-07-31 DIAGNOSIS — N183 Chronic kidney disease, stage 3 (moderate): Secondary | ICD-10-CM | POA: Diagnosis not present

## 2017-08-01 ENCOUNTER — Other Ambulatory Visit: Payer: Self-pay | Admitting: *Deleted

## 2017-08-01 NOTE — Progress Notes (Signed)
   CC: Hypertension follow-up  HPI:  [name] is a 57 y.o. male with past medical history of conditions listed below presenting to the clinic for a follow-up of hypertension.  Diabetes was also discussed. Please see problem based charting for the status of the patient's current and chronic medical conditions.   Past Medical History:  Diagnosis Date  . Abdominal pain    lower abd  . Abnormal abdominal CT scan    wall thickening  . Anemia   . CAD (coronary artery disease)    cath 8/12:  LM ok, mLAD occluded with trivial collats from right AM and dRCA, mDx (small) 80%, RI 25%, tiny branch of OM 90%, mAM 30-40%, RCA sub-totally occluded, then 80%, EF 40%, inf HK.  He underwent PCI with [name] with placement of a Promus DES to the mRCA x 2  . Chronic kidney disease    stage 3 per chart  . Diabetes mellitus   . Dyslipidemia   . ED (erectile dysfunction)   . Gait instability 02/27/2011  . History of alcohol abuse   . History of stomach ulcers   . History of stroke    evidence of CVA on MRI in past  . Hyperlipidemia   . Hypertension   . Ischemic cardiomyopathy    echo 7/12: EF 40-45%, septal, apical, inf basal HK, mod LVH, mild MR, mild LAE.  EF 55% by echo 2015  . Macular hemorrhage   . Macular hemorrhage   . Memory change   . Prostate cancer ([place])   . PSA elevation   . Sleep apnea    no c-pap; wife denies  . Stroke [facility]) 2012   gait distrubance; wife denies use of assistive device  . Vision, loss, sudden 02/27/2011  . Wears glasses   . Weight loss    Review of Systems: Pertinent positives mentioned in HPI. Remainder of all ROS negative.   Physical Exam:  Vitals:   07/30/17 1333  BP: 128/76  Pulse: 68  Temp: 98.4 F (36.9 C)  TempSrc: Oral  SpO2: 100%  Weight: 212 lb 12.8 oz (96.5 kg)  Height: 6' (1.829 m)   Physical Exam  Constitutional: He is oriented to person, place, and time. He appears well-developed and well-nourished. No distress.  HENT:    Head: Normocephalic and atraumatic.  Mouth/Throat: Oropharynx is clear and moist.  Eyes: Right eye exhibits no discharge. Left eye exhibits no discharge.  Cardiovascular: Normal rate, regular rhythm and intact distal pulses.  Pulmonary/Chest: Effort normal and breath sounds normal. No respiratory distress.  Abdominal: Soft. Bowel sounds are normal. He exhibits no distension. There is no tenderness.  Musculoskeletal: He exhibits no edema.  Neurological: He is alert and oriented to person, place, and time.  Skin: Skin is warm and dry.    Assessment & Plan:   See Encounters Tab for problem based charting.  Patient discussed with Dr. Rebeca Alert

## 2017-08-01 NOTE — Assessment & Plan Note (Addendum)
BP Readings from Last 3 Encounters:  07/30/17 128/76  07/02/17 (!) 150/84  05/27/17 138/89    Lab Results  Component Value Date   NA 135 05/02/2017   K 4.5 05/02/2017   CREATININE 1.69 (H) 05/02/2017    Assessment: Blood pressure control:   Well-controlled Progress toward BP goal:   Improved Comments: Current medication regimen is supposed to include lisinopril 10 mg daily, hydrochlorothiazide 25 mg daily, and Coreg 25 mg twice daily.  From speaking to the patient and reviewing his medication bottles it seems he is also taking spironolactone 25 mg daily.  Patient and wife are interested in decreasing the number of medications he takes.  Plan: Medications: Advised him to stop taking spironolactone.  Increased dose of lisinopril to 20 mg once daily due to its renal protective effect in the setting of diabetes and chronic kidney disease.  Continue hydrochlorothiazide and Coreg as above. Educational resources provided:   Educated patient about healthy eating and exercise.  Other plans: Recheck blood pressure at follow-up visit.

## 2017-08-01 NOTE — Assessment & Plan Note (Addendum)
Lab Results  Component Value Date   HGBA1C 9.3 07/02/2017   HGBA1C 7.3 09/18/2016   HGBA1C 8.2 05/08/2016     Assessment: Diabetes control:   Above goal Comments: Patient is currently taking glipizide 10 mg twice daily and denies having any hypoglycemic symptoms.  Random blood glucose 269 at this visit.  Wife states he has been eating a lot of sweets.  Discussed starting him on metformin but patient and wife want to reduce the number of medications he takes and prefer making lifestyle changes instead.  Plan: Medications:  continue current medications Instruction/counseling given: reminded to get eye exam, reminded to bring medications to each visit, discussed foot care, discussed diet and discussed sick day management Other plans: Referral to [name] for advice on diet and nutrition.  Repeat A1c in 2 months.

## 2017-08-01 NOTE — Assessment & Plan Note (Signed)
Patient states he received his influenza vaccine at nephrology office.

## 2017-08-01 NOTE — Assessment & Plan Note (Addendum)
Patient has an upcoming appointment with nephrology (a day after this visit). As such, BMP not done at this time.

## 2017-08-01 NOTE — Progress Notes (Signed)
Internal Medicine Clinic Attending  Case discussed with Dr. Rathore  at the time of the visit.  We reviewed the resident's history and exam and pertinent patient test results.  I agree with the assessment, diagnosis, and plan of care documented in the resident's note.  Alexander N Raines, MD  

## 2017-08-02 NOTE — Telephone Encounter (Signed)
Pt was seen in clinic 3/12

## 2017-08-02 NOTE — Telephone Encounter (Signed)
No answer

## 2017-08-04 MED ORDER — CARVEDILOL 25 MG PO TABS: 25.0000 mg | ORAL_TABLET | Freq: Two times a day (BID) | ORAL | 1 refills | Status: DC

## 2017-08-04 MED ORDER — HYDROCHLOROTHIAZIDE 25 MG PO TABS: 25.0000 mg | ORAL_TABLET | Freq: Every day | ORAL | 1 refills | Status: DC

## 2017-08-04 MED ORDER — CLOPIDOGREL BISULFATE 75 MG PO TABS: 75.0000 mg | ORAL_TABLET | Freq: Every day | ORAL | 1 refills | Status: DC

## 2017-08-05 ENCOUNTER — Encounter: Payer: Medicare Other | Admitting: Dietician

## 2017-08-06 ENCOUNTER — Ambulatory Visit
Admission: RE | Admit: 2017-08-06 | Discharge: 2017-08-06 | Disposition: A | Discharge status: Home / Self Care | Payer: Medicare Other | Source: Ambulatory Visit | Attending: Urology | Admitting: Urology

## 2017-08-06 ENCOUNTER — Other Ambulatory Visit: Payer: Self-pay

## 2017-08-06 ENCOUNTER — Encounter: Payer: Self-pay | Admitting: Urology

## 2017-08-06 VITALS — BP 121/72 | HR 65 | Temp 97.9°F | Resp 20 | Ht <= 58 in | Wt 211.2 lb

## 2017-08-06 DIAGNOSIS — C61 Malignant neoplasm of prostate: Secondary | ICD-10-CM | POA: Diagnosis not present

## 2017-08-06 DIAGNOSIS — R5383 Other fatigue: Secondary | ICD-10-CM | POA: Insufficient documentation

## 2017-08-06 DIAGNOSIS — Z7984 Long term (current) use of oral hypoglycemic drugs: Secondary | ICD-10-CM | POA: Insufficient documentation

## 2017-08-06 DIAGNOSIS — R351 Nocturia: Secondary | ICD-10-CM | POA: Insufficient documentation

## 2017-08-06 DIAGNOSIS — Z7902 Long term (current) use of antithrombotics/antiplatelets: Secondary | ICD-10-CM | POA: Diagnosis not present

## 2017-08-06 DIAGNOSIS — Z79899 Other long term (current) drug therapy: Secondary | ICD-10-CM | POA: Insufficient documentation

## 2017-08-06 DIAGNOSIS — Z7982 Long term (current) use of aspirin: Secondary | ICD-10-CM | POA: Insufficient documentation

## 2017-08-06 NOTE — Addendum Note (Signed)
Encounter addended by: Malena Edman, RN on: 08/06/2017 2:31 PM  Actions taken: Charge Capture section accepted

## 2017-08-06 NOTE — Progress Notes (Signed)
Radiation Oncology         (336) [phone] ________________________________  Name: Kevin Soto MRN: [id number]  Date: 08/06/2017  DOB: [birth year]  Post Treatment Note  CC: Shela Leff, MD  Alexis Frock, MD  Diagnosis:   57 year-old gentleman with Stage T2c adenocarcinoma of the prostate with Gleason Score of 4+4, and PSA of 43.  Interval Since Last Radiation:  4 weeks   1. 05/08/2017: Insertion of radioactive I-125 seeds into the prostate gland;110Gy, boost therapy.  2.         05/26/2017 to 07/09/2017: Prostate IMRT:  The patient received 45 Gy in 25 fractions of 1.8 Gy, to supplement an up-front prostate seed implant boost of 110 Gy to achieve a total nominal dose of 155 Gy.  Narrative:  The patient returns today for routine follow-up.  He tolerated radiation treatment relatively well.   He experienced some minor urinary irritation including, some leakage and nocturia x2. He denied any fatigue, urgency, dysuria, or hematuria. He felt like he was able to empty his bladder well on voiding and maintained a strong stream. He denied any bowel issues.  He remained on androgen deprivation therapy throughout  his course of treatment.  On review of systems, the patient states that he is doing very well overall.  He has minimal residual lower urinary tract symptoms and is basically back to his baseline with nocturia 2-3 times per night depending on the amount of fluids he drinks prior to bedtime.  His current IPSS score is 5 indicating mild urinary symptoms.  He continues with mild increased frequency but denies urgency, weak stream, incomplete bladder emptying or incontinence.  He denies dysuria, gross hematuria, fever or chills.  He reports a healthy appetite and is maintaining his weight.  He denies abdominal pain, nausea, vomiting or diarrhea.  He has mild persistent fatigue but overall continues to tolerate androgen deprivation therapy very well and denies significant hot flashes or  bother.  ALLERGIES:  is allergic to clonidine derivatives.  Meds: Current Outpatient Medications  Medication Sig Dispense Refill  . ACCU-CHEK FASTCLIX LANCETS MISC Use to check blood sugar two times a day 102 each 12  . aspirin EC 81 MG tablet Take 1 tablet (81 mg total) by mouth daily.    Marland Kitchen atorvastatin (LIPITOR) 40 MG tablet Take 1 tablet (40 mg total) by mouth daily. 90 tablet 1  . Blood Glucose Monitoring Suppl (ACCU-CHEK GUIDE) w/Device KIT 1 each by Does not apply route 2 (two) times daily. 1 kit 0  . carvedilol (COREG) 25 MG tablet Take 1 tablet (25 mg total) by mouth 2 (two) times daily with a meal. 180 tablet 1  . clopidogrel (PLAVIX) 75 MG tablet Take 1 tablet (75 mg total) by mouth daily. 90 tablet 1  . glipiZIDE (GLUCOTROL) 10 MG tablet Take 1 tablet (10 mg total) by mouth 2 (two) times daily before a meal. 180 tablet 0  . glucose blood (ACCU-CHEK GUIDE) test strip Use as instructed 100 each 12  . hydrochlorothiazide (HYDRODIURIL) 25 MG tablet Take 1 tablet (25 mg total) by mouth daily. 90 tablet 1  . lisinopril (PRINIVIL,ZESTRIL) 20 MG tablet Take 1 tablet (20 mg total) by mouth daily. 90 tablet 0  . metFORMIN (GLUCOPHAGE) 500 MG tablet Take 1 tablet (500 mg total) by mouth 2 (two) times daily with a meal. 180 tablet 1  . nitroGLYCERIN (NITROSTAT) 0.4 MG SL tablet Place 1 tablet (0.4 mg total) under the tongue every 5 (five) minutes  as needed. (Patient not taking: Reported on 07/02/2017) 100 tablet 1   Current Facility-Administered Medications  Medication Dose Route Frequency Provider Last Rate Last Dose  . 0.9 %  sodium chloride infusion  500 mL Intravenous Continuous Ladene Artist, MD        Physical Findings:  height is 6" (0.152 m) (abnormal) and weight is 211 lb 3.2 oz (95.8 kg). His oral temperature is 97.9 F (36.6 C). His blood pressure is 121/72 and his pulse is 65. His respiration is 20 and oxygen saturation is 94%.  Pain Assessment Pain Score: 0-No pain/10 In  general this is a well appearing African-American male in no acute distress.  He's alert and oriented x4 and appropriate throughout the examination. Cardiopulmonary assessment is negative for acute distress and he exhibits normal effort.   Lab Findings: Lab Results  Component Value Date   WBC 8.4 05/02/2017   HGB 11.5 (L) 05/02/2017   HCT 34.5 (L) 05/02/2017   MCV 87.6 05/02/2017   PLT 282 05/02/2017     Radiographic Findings: No results found.  Impression/Plan: 20. 57 year-old gentleman with Stage T2c adenocarcinoma of the prostate with Gleason Score of 4+4, and PSA of 43. He will continue to follow up with urology for ongoing PSA determinations and has an appointment scheduled with Dr. Tresa Moore on August 20, 2017.  He anticipates completing a 2-year course of androgen deprivation therapy. He understands what to expect with regards to PSA monitoring going forward. I will look forward to following his response to treatment via correspondence with urology, and would be happy to continue to participate in his care if clinically indicated. I talked to the patient about what to expect in the future, including his risk for erectile dysfunction and rectal bleeding. I encouraged him to call or return to the office if he has any questions regarding his previous radiation or possible radiation side effects. He was comfortable with this plan and will follow up as needed.    Nicholos Johns, PA-C

## 2017-08-13 DIAGNOSIS — C61 Malignant neoplasm of prostate: Secondary | ICD-10-CM | POA: Diagnosis not present

## 2017-08-20 DIAGNOSIS — C61 Malignant neoplasm of prostate: Secondary | ICD-10-CM | POA: Diagnosis not present

## 2017-08-20 DIAGNOSIS — N184 Chronic kidney disease, stage 4 (severe): Secondary | ICD-10-CM | POA: Diagnosis not present

## 2017-08-20 DIAGNOSIS — R3915 Urgency of urination: Secondary | ICD-10-CM | POA: Diagnosis not present

## 2017-08-23 ENCOUNTER — Ambulatory Visit: Payer: Medicare Other | Admitting: Podiatry

## 2017-08-27 DIAGNOSIS — T85398D Other mechanical complication of other ocular prosthetic devices, implants and grafts, subsequent encounter: Secondary | ICD-10-CM | POA: Diagnosis not present

## 2017-08-27 DIAGNOSIS — H3582 Retinal ischemia: Secondary | ICD-10-CM | POA: Diagnosis not present

## 2017-08-27 DIAGNOSIS — H2522 Age-related cataract, morgagnian type, left eye: Secondary | ICD-10-CM | POA: Diagnosis not present

## 2017-08-27 DIAGNOSIS — E113591 Type 2 diabetes mellitus with proliferative diabetic retinopathy without macular edema, right eye: Secondary | ICD-10-CM | POA: Diagnosis not present

## 2017-09-05 ENCOUNTER — Encounter: Payer: Medicare Other | Admitting: Dietician

## 2017-10-03 NOTE — Addendum Note (Signed)
Addended by: Hulan Fray on: 10/03/2017 07:34 PM   Modules accepted: Orders

## 2017-10-04 ENCOUNTER — Encounter: Payer: Self-pay | Admitting: Podiatry

## 2017-10-04 ENCOUNTER — Ambulatory Visit: Payer: Medicare Other | Admitting: Podiatry

## 2017-10-04 DIAGNOSIS — B351 Tinea unguium: Secondary | ICD-10-CM | POA: Diagnosis not present

## 2017-10-04 DIAGNOSIS — M79676 Pain in unspecified toe(s): Secondary | ICD-10-CM | POA: Diagnosis not present

## 2017-10-04 DIAGNOSIS — E119 Type 2 diabetes mellitus without complications: Secondary | ICD-10-CM

## 2017-10-04 NOTE — Progress Notes (Signed)
Complaint:  Visit Type: Patient returns to my office for continued preventative foot care services. Complaint: Patient states" my nails have grown long and thick and become painful to walk and wear shoes" Patient has been diagnosed with DM with no foot complications. The patient presents for preventative foot care services. No changes to ROS  Podiatric Exam: Vascular: dorsalis pedis and posterior tibial pulses are palpable bilateral. Capillary return is immediate. Temperature gradient is WNL. Skin turgor WNL  Sensorium: Normal Semmes Weinstein monofilament test. Normal tactile sensation bilaterally. Nail Exam: Pt has thick disfigured discolored nails with subungual debris noted bilateral entire nail hallux through fifth toenails Ulcer Exam: There is no evidence of ulcer or pre-ulcerative changes or infection. Orthopedic Exam: Muscle tone and strength are WNL. No limitations in general ROM. No crepitus or effusions noted. Foot type and digits show no abnormalities. HAV  B/L. Skin: No Porokeratosis. No infection or ulcers  Diagnosis:  Onychomycosis, , Pain in right toe, pain in left toes  Diabetes  Treatment & Plan Procedures and Treatment: Consent by patient was obtained for treatment procedures.   Debridement of mycotic and hypertrophic toenails, 1 through 5 bilateral and clearing of subungual debris. No ulceration, no infection noted.  Return Visit-Office Procedure: Patient instructed to return to the office for a follow up visit 3 months for continued evaluation and treatment.    Gardiner Barefoot DPM

## 2017-11-01 ENCOUNTER — Telehealth: Payer: Self-pay | Admitting: *Deleted

## 2017-11-01 NOTE — Telephone Encounter (Signed)
Patient's wife called in asking if patient should be on metformin. Informed wife that per OV note on 07/30/2017 PCP recommended restarting metformin in addition to glipizide but patient preferred to try lifestyle modifications first. Was supposed to meet with [name] for nutrition. Wife states they have met with nutritionist outside [facility] and know what to do and have been doing it. Will continue without metformin at this time. Patient is overdue for 2 month f/u. Scheduled with PCP 11/05/2017 at 3:45. Wife appreciative. Hubbard Hartshorn, RN, BSN

## 2017-11-01 NOTE — Telephone Encounter (Signed)
Thanks for the update

## 2017-11-05 ENCOUNTER — Other Ambulatory Visit: Payer: Self-pay

## 2017-11-05 ENCOUNTER — Encounter: Payer: Self-pay | Admitting: Internal Medicine

## 2017-11-05 ENCOUNTER — Ambulatory Visit (INDEPENDENT_AMBULATORY_CARE_PROVIDER_SITE_OTHER): Payer: Medicare Other | Admitting: Internal Medicine

## 2017-11-05 VITALS — BP 147/81 | HR 70 | Temp 98.5°F | Ht 72.0 in | Wt 214.0 lb

## 2017-11-05 DIAGNOSIS — E1159 Type 2 diabetes mellitus with other circulatory complications: Secondary | ICD-10-CM

## 2017-11-05 DIAGNOSIS — Z7984 Long term (current) use of oral hypoglycemic drugs: Secondary | ICD-10-CM

## 2017-11-05 DIAGNOSIS — E1149 Type 2 diabetes mellitus with other diabetic neurological complication: Secondary | ICD-10-CM | POA: Diagnosis not present

## 2017-11-05 DIAGNOSIS — I10 Essential (primary) hypertension: Secondary | ICD-10-CM | POA: Diagnosis not present

## 2017-11-05 DIAGNOSIS — Z79899 Other long term (current) drug therapy: Secondary | ICD-10-CM | POA: Diagnosis not present

## 2017-11-05 LAB — POCT GLYCOSYLATED HEMOGLOBIN (HGB A1C): HEMOGLOBIN A1C: 7.4 % — AB (ref 4.0–5.6)

## 2017-11-05 LAB — GLUCOSE, CAPILLARY: GLUCOSE-CAPILLARY: 282 mg/dL — AB (ref 65–99)

## 2017-11-05 MED ORDER — METFORMIN HCL 500 MG PO TABS: 500.0000 mg | ORAL_TABLET | Freq: Two times a day (BID) | ORAL | 3 refills | Status: DC

## 2017-11-05 NOTE — Patient Instructions (Signed)
Kevin Soto it was nice seeing you today.   For diabetes:  -Continue taking glipizide 10 mg twice daily with meals  -Continue taking metformin 500 mg twice daily with meals  -You are doing a great job going to [employer] for exercise.  I would like to encourage you to keep eating healthy and engaging in cardio exercises 30 minutes a day 5 days a week.   For high blood pressure:  -Start taking lisinopril 20 mg daily  -Continue taking carvedilol, hydrochlorothiazide, and tamsulosin as instructed  -Return for a follow-up in 4 to 6 weeks

## 2017-11-06 NOTE — Assessment & Plan Note (Addendum)
BP Readings from Last 3 Encounters:  11/05/17 (!) 147/81  08/06/17 121/72  07/30/17 128/76    Lab Results  Component Value Date   NA 135 05/02/2017   K 4.5 05/02/2017   CREATININE 1.69 (H) 05/02/2017    Assessment: Blood pressure control:  Uncontrolled Comments: Current medication regimen includes carvedilol 25 mg twice daily, hydrochlorothiazide 25 mg daily, lisinopril 20 mg daily, and tamsulosin 0.4 mg daily.  Patient has not been taking lisinopril.  He is taking carvedilol only once a day.  He continues to take hydrochlorothiazide and tamsulosin as instructed.  Labs done by nephrology in March 2019 showing creatinine 1.4 and GFR 62.  Plan: Medications: Advised him to start taking lisinopril 20 mg daily and take carvedilol twice daily as instructed.  Continue hydrochlorothiazide and tamsulosin. Educational resources provided:   Educated patient about healthy eating and exercise. Emphasized the importance of weight loss.  Other plans: Follow-up in 6 weeks.  If patient continues to be hypertensive, consider increasing dose of lisinopril.

## 2017-11-06 NOTE — Progress Notes (Signed)
   CC: Diabetes and hypertension follow-up  HPI:  [name] is a 57 y.o. male with a past medical history of conditions listed below presenting to the clinic for a follow-up of hypertension and diabetes. Please see problem based charting for the status of the patient's current and chronic medical conditions.   Past Medical History:  Diagnosis Date  . Abdominal pain    lower abd  . Abnormal abdominal CT scan    wall thickening  . Anemia   . CAD (coronary artery disease)    cath 8/12:  LM ok, mLAD occluded with trivial collats from right AM and dRCA, mDx (small) 80%, RI 25%, tiny branch of OM 90%, mAM 30-40%, RCA sub-totally occluded, then 80%, EF 40%, inf HK.  He underwent PCI with [name] with placement of a Promus DES to the mRCA x 2  . Chronic kidney disease    stage 3 per chart  . Diabetes mellitus   . Dyslipidemia   . ED (erectile dysfunction)   . Gait instability 02/27/2011  . History of alcohol abuse   . History of stomach ulcers   . History of stroke    evidence of CVA on MRI in past  . Hyperlipidemia   . Hypertension   . Ischemic cardiomyopathy    echo 7/12: EF 40-45%, septal, apical, inf basal HK, mod LVH, mild MR, mild LAE.  EF 55% by echo 2015  . Macular hemorrhage   . Macular hemorrhage   . Memory change   . Prostate cancer ([place])   . PSA elevation   . Sleep apnea    no c-pap; wife denies  . Stroke [facility]) 2012   gait distrubance; wife denies use of assistive device  . Vision, loss, sudden 02/27/2011  . Wears glasses   . Weight loss    Review of Systems: Pertinent positives mentioned in HPI. Remainder of all ROS negative.   Physical Exam:  Vitals:   11/05/17 1603  BP: (!) 147/81  Pulse: 70  Temp: 98.5 F (36.9 C)  TempSrc: Oral  SpO2: 100%  Weight: 214 lb (97.1 kg)  Height: 6' (1.829 m)   Physical Exam  Constitutional: He is oriented to person, place, and time. He appears well-developed and well-nourished. No distress.  HENT:    Mouth/Throat: Oropharynx is clear and moist.  Eyes: Right eye exhibits no discharge. Left eye exhibits no discharge.  Cardiovascular: Normal rate, regular rhythm and intact distal pulses.  Pulmonary/Chest: Effort normal and breath sounds normal. No respiratory distress. He has no wheezes. He has no rales.  Abdominal: Soft. Bowel sounds are normal. He exhibits no distension. There is no tenderness.  Musculoskeletal: He exhibits no edema.  Neurological: He is alert and oriented to person, place, and time.  Skin: Skin is warm and dry.    Assessment & Plan:   See Encounters Tab for problem based charting.  Patient discussed with Dr. Daryll Drown

## 2017-11-06 NOTE — Assessment & Plan Note (Addendum)
Lab Results  Component Value Date   HGBA1C 7.4 (A) 11/05/2017   HGBA1C 9.3 07/02/2017   HGBA1C 7.3 09/18/2016     Assessment: Comments: Current medication regimen includes glipizide 10 mg twice daily and metformin 500 mg twice daily.  Patient states he was taking metformin but ran out 2 weeks ago.  Denies having any diarrhea or other GI symptoms from metformin.  He is not having any hypoglycemic episodes from glipizide.  He is going to the [facility] to exercise a few times a week and is watching his dietary carbohydrate intake.  He is being followed by [place] [facility] for his annual eye exams.  Plan: Medications:  continue current medications.  We will not increase the dose of metformin at this time as patient is motivated toward making lifestyle changes. Instruction/counseling given: reminded to get eye exam, reminded to bring medications to each visit, discussed foot care, discussed diet and discussed sick day management Other plans: Repeat A1c in 3 months.  Consider increasing dose of metformin if he has poor glycemic control during future visit.

## 2017-11-07 ENCOUNTER — Telehealth: Payer: Self-pay | Admitting: *Deleted

## 2017-11-07 NOTE — Telephone Encounter (Signed)
Patient's wife called in stating OTC nasal spray is no longer working. Reports using oxymetazoline daily for last 2-3 years. Explained this med is for short course, 2-3 days only, as it causes rebound congestion with long term use. Appt made with PCP 11/12/2017 to discuss safe options for nasal congestion. Hubbard Hartshorn, RN, BSN

## 2017-11-11 NOTE — Progress Notes (Signed)
Internal Medicine Clinic Attending  Case discussed with Dr. Rathoreat the time of the visit. We reviewed the resident's history and exam and pertinent patient test results. I agree with the assessment, diagnosis, and plan of care documented in the resident's note.  

## 2017-11-12 ENCOUNTER — Encounter: Payer: Self-pay | Admitting: Internal Medicine

## 2017-11-12 ENCOUNTER — Ambulatory Visit (INDEPENDENT_AMBULATORY_CARE_PROVIDER_SITE_OTHER): Payer: Medicare Other | Admitting: Internal Medicine

## 2017-11-12 ENCOUNTER — Other Ambulatory Visit: Payer: Self-pay

## 2017-11-12 ENCOUNTER — Telehealth: Payer: Self-pay | Admitting: Dietician

## 2017-11-12 VITALS — BP 126/68 | HR 64 | Temp 98.1°F | Ht 72.0 in | Wt 213.9 lb

## 2017-11-12 DIAGNOSIS — Z79899 Other long term (current) drug therapy: Secondary | ICD-10-CM

## 2017-11-12 DIAGNOSIS — R0981 Nasal congestion: Secondary | ICD-10-CM | POA: Diagnosis not present

## 2017-11-12 DIAGNOSIS — E1159 Type 2 diabetes mellitus with other circulatory complications: Secondary | ICD-10-CM

## 2017-11-12 DIAGNOSIS — I10 Essential (primary) hypertension: Secondary | ICD-10-CM

## 2017-11-12 DIAGNOSIS — I152 Hypertension secondary to endocrine disorders: Secondary | ICD-10-CM

## 2017-11-12 MED ORDER — FLUTICASONE PROPIONATE 50 MCG/ACT NA SUSP: 2.0000 | Freq: Every day | NASAL | 1 refills | Status: DC

## 2017-11-12 NOTE — Progress Notes (Signed)
   CC: Nasal congestion  HPI:  [name] is a 57 y.o. male with past medical history of conditions listed below presenting to the clinic complaining of nasal congestion.  Hypertension was also discussed. Please see problem based charting for the status of the patient's current and chronic medical conditions.   Past Medical History:  Diagnosis Date  . Abdominal pain    lower abd  . Abnormal abdominal CT scan    wall thickening  . Anemia   . CAD (coronary artery disease)    cath 8/12:  LM ok, mLAD occluded with trivial collats from right AM and dRCA, mDx (small) 80%, RI 25%, tiny branch of OM 90%, mAM 30-40%, RCA sub-totally occluded, then 80%, EF 40%, inf HK.  He underwent PCI with [name] with placement of a Promus DES to the mRCA x 2  . Chronic kidney disease    stage 3 per chart  . Diabetes mellitus   . Dyslipidemia   . ED (erectile dysfunction)   . Gait instability 02/27/2011  . History of alcohol abuse   . History of stomach ulcers   . History of stroke    evidence of CVA on MRI in past  . Hyperlipidemia   . Hypertension   . Ischemic cardiomyopathy    echo 7/12: EF 40-45%, septal, apical, inf basal HK, mod LVH, mild MR, mild LAE.  EF 55% by echo 2015  . Macular hemorrhage   . Macular hemorrhage   . Memory change   . Prostate cancer ([place])   . PSA elevation   . Sleep apnea    no c-pap; wife denies  . Stroke [facility]) 2012   gait distrubance; wife denies use of assistive device  . Vision, loss, sudden 02/27/2011  . Wears glasses   . Weight loss    Review of Systems: Pertinent positives mentioned in HPI. Remainder of all ROS negative.   Physical Exam:  Vitals:   11/12/17 1608  BP: 126/68  Pulse: 64  Temp: 98.1 F (36.7 C)  TempSrc: Oral  SpO2: 100%  Weight: 213 lb 14.4 oz (97 kg)  Height: 6' (1.829 m)   Physical Exam  Constitutional: He is oriented to person, place, and time. He appears well-developed and well-nourished. No distress.  HENT:    Head: Normocephalic and atraumatic.  Mouth/Throat: Oropharynx is clear and moist.  Nasal examination: Nasal turbinates do not appear erythematous or boggy.  Clear nasal secretions noted. Bilateral ear examination: Tympanic membrane intact and pearly white in color.  No erythema or drainage noted. Sinuses nontender to palpation.  Eyes: Right eye exhibits no discharge. Left eye exhibits no discharge.  Cardiovascular: Normal rate, regular rhythm and intact distal pulses.  Pulmonary/Chest: Effort normal and breath sounds normal. No respiratory distress. He has no wheezes. He has no rales.  Abdominal: Soft. Bowel sounds are normal. He exhibits no distension. There is no tenderness.  Musculoskeletal: He exhibits no edema.  Neurological: He is alert and oriented to person, place, and time.  Skin: Skin is warm and dry.    Assessment & Plan:   See Encounters Tab for problem based charting.  Patient discussed with Dr. Angelia Mould

## 2017-11-12 NOTE — Patient Instructions (Addendum)
Mr. Olano it was nice seeing you today.  -Use Flonase nasal spray: 2 sprays into each nostril daily  -Use a Nettie pot to do daily nasal saline irrigations.  You may buy the kit at any local pharmacy and follow the instructions on the packaging.

## 2017-11-12 NOTE — Assessment & Plan Note (Signed)
Blood pressure well controlled at present (126/68).  He is taking lisinopril 20 mg daily, carvedilol 25 mg twice daily, hydrochlorothiazide 25 mg daily, and tamsulosin 0.4 mg daily.  Plan -Continue current medications

## 2017-11-12 NOTE — Assessment & Plan Note (Addendum)
Patient reports having nasal congestion for the past 2 years. Reports having "sneezing fits" every week.  Denies having any itchy eyes, rhinorrhea, or sore throat.  Denies having any sinus pressure/pain or headaches.  Denies having any fevers or chills. States his nasal secretions are clear.  No sinus tenderness on exam.  Symptoms consistent with allergic rhinitis.  Plan -Daily nasal saline irrigations -Flonase nasal spray

## 2017-11-12 NOTE — Telephone Encounter (Signed)
Patient says he follows regularly with [name]. Their office is faxing the notes form his last visit.

## 2017-11-13 ENCOUNTER — Encounter: Payer: Self-pay | Admitting: *Deleted

## 2017-11-13 ENCOUNTER — Other Ambulatory Visit: Payer: Self-pay | Admitting: Internal Medicine

## 2017-11-13 DIAGNOSIS — E1159 Type 2 diabetes mellitus with other circulatory complications: Secondary | ICD-10-CM

## 2017-11-13 DIAGNOSIS — I152 Hypertension secondary to endocrine disorders: Secondary | ICD-10-CM

## 2017-11-13 DIAGNOSIS — E1149 Type 2 diabetes mellitus with other diabetic neurological complication: Secondary | ICD-10-CM

## 2017-11-13 DIAGNOSIS — I10 Essential (primary) hypertension: Secondary | ICD-10-CM

## 2017-11-13 NOTE — Progress Notes (Signed)
Internal Medicine Clinic Attending  Case discussed with Dr. Rathoreat the time of the visit. We reviewed the resident's history and exam and pertinent patient test results. I agree with the assessment, diagnosis, and plan of care documented in the resident's note.  

## 2017-12-19 IMAGING — MR MR LUMBAR SPINE W/O CM
4 of 5 series · 18 of 48 positions shown · non-contrast
Comparison: Lumbar spine radiograph 07/10/2016

CLINICAL DATA: Low back pain

EXAM:
MRI LUMBAR SPINE WITHOUT CONTRAST
TECHNIQUE: Multiplanar, multisequence MR imaging of the lumbar spine was
performed. No intravenous contrast was administered.

[Series 3: T2 · sagittal · 4.0mm · 0.55mm/px · 6 of 14 slices shown (1 of 2)]
[im 1/14]
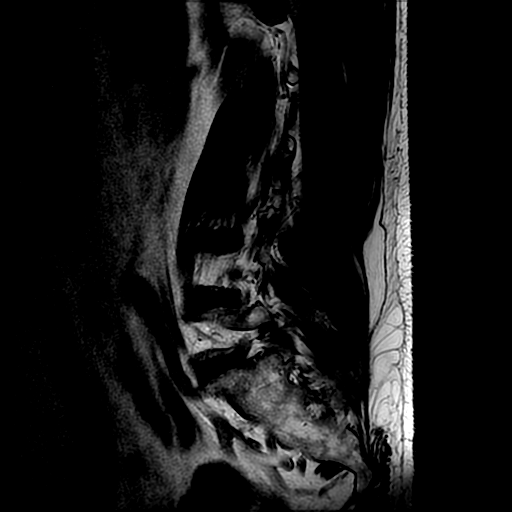
[im 3/14]
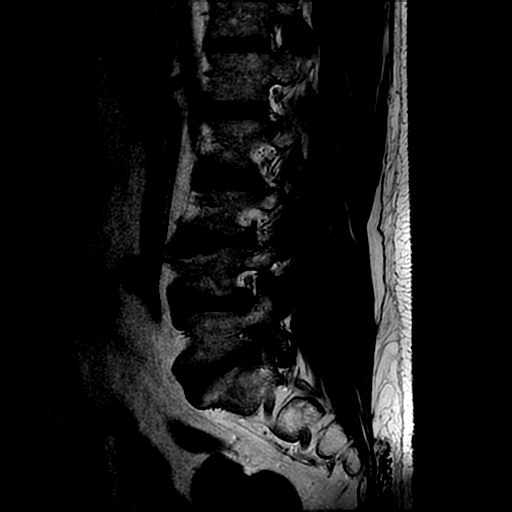
[im 6/14]
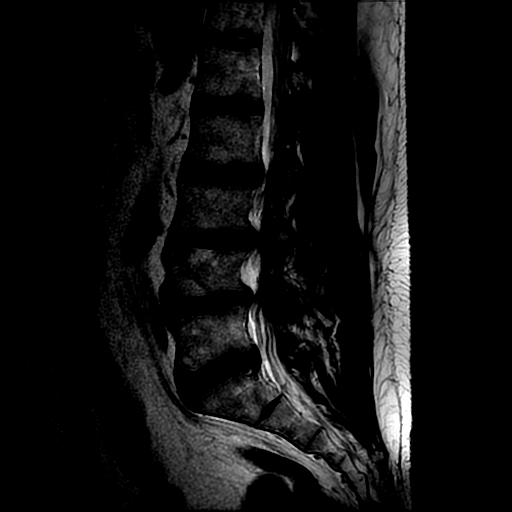
[im 8/14]
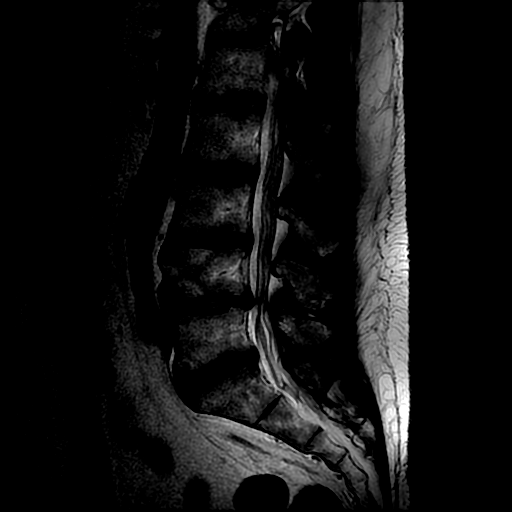
[im 11/14]
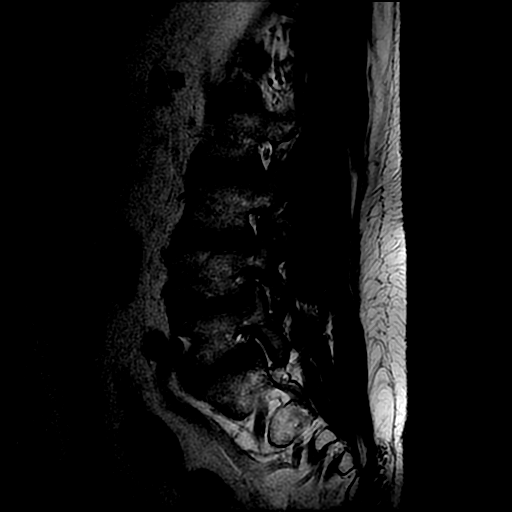
[im 14/14]
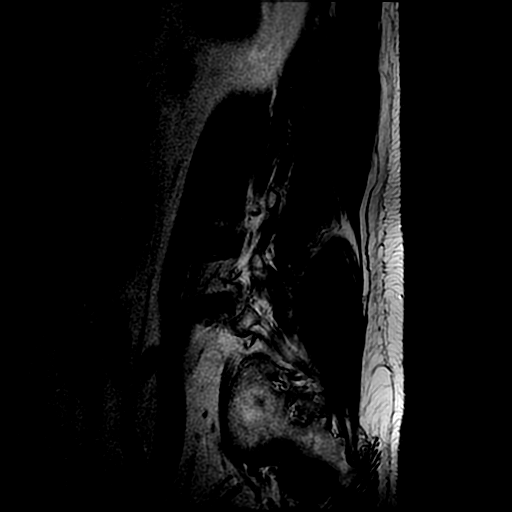

[Series 4: T1 · sagittal · 4.0mm · 0.55mm/px · 3 of 14 slices shown (1 of 2)]
[im 3/14]
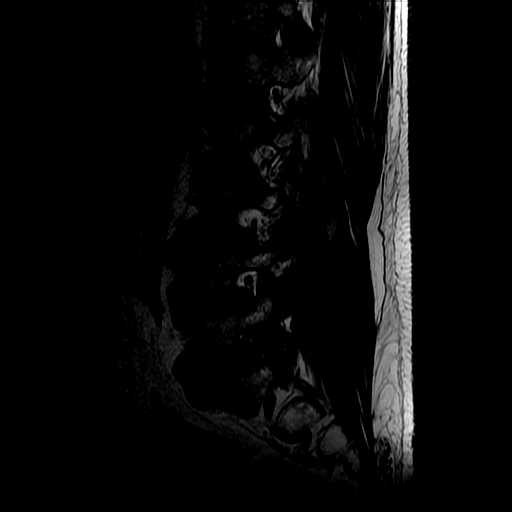
[im 8/14]
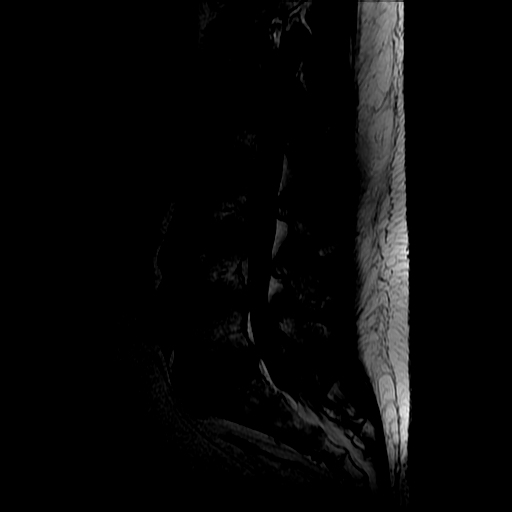
[im 14/14]
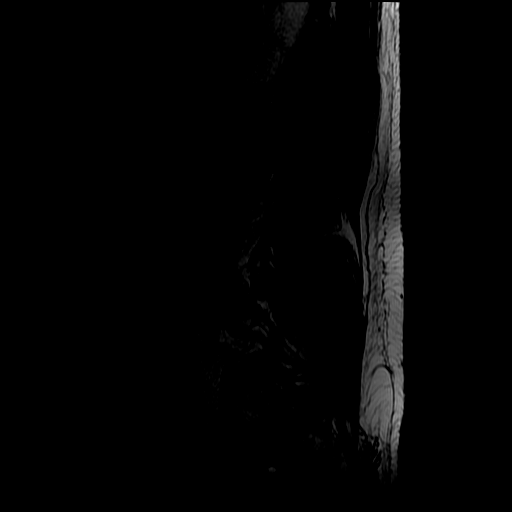

[Series 6: T2 · axial · 4.0mm · 0.39mm/px · z∈[-101,+51]mm · 6 of 36 slices shown (2 of 2)]
[im 1/36]
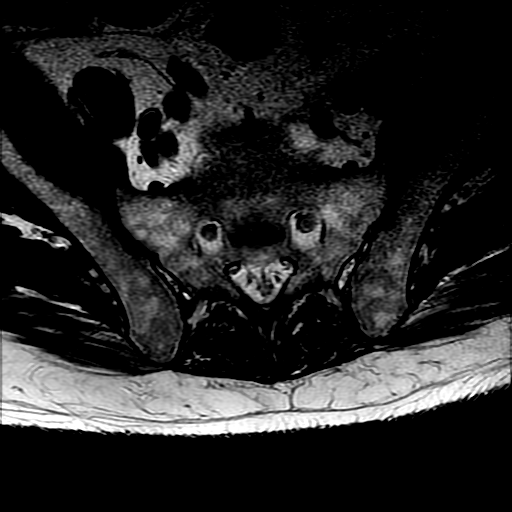
[im 6/36]
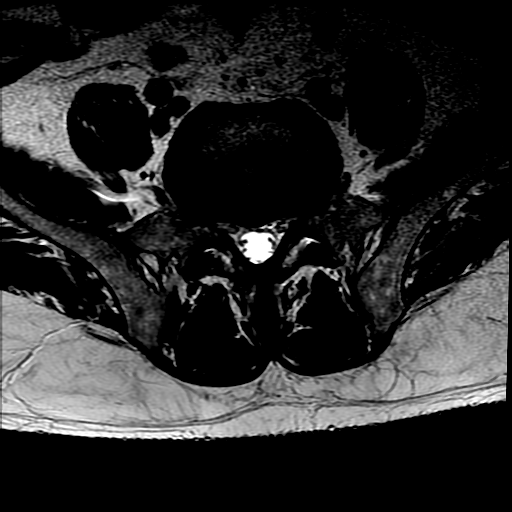
[im 11/36]
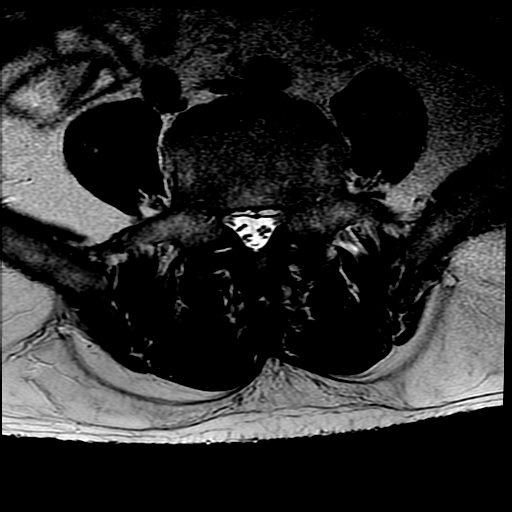
[im 16/36]
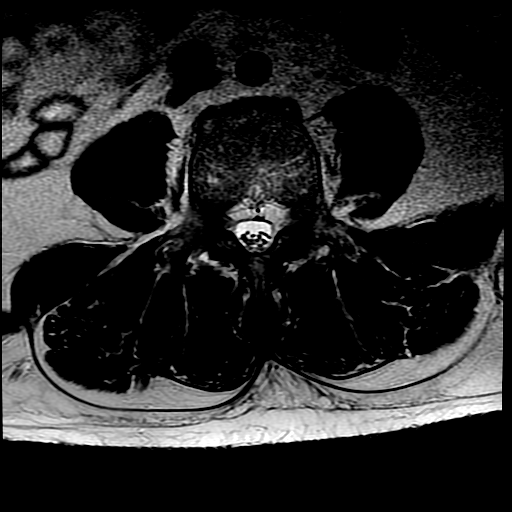
[im 18/36]
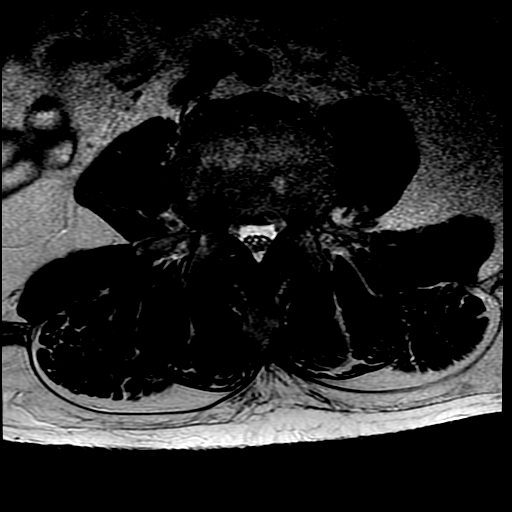
[im 31/36]
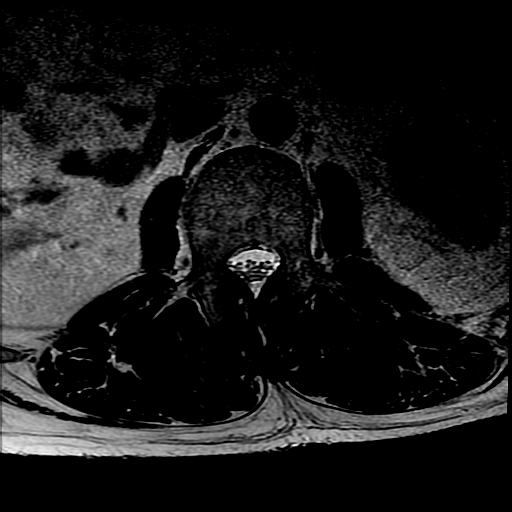

[Series 7: T1 · axial · 4.0mm · 0.39mm/px · z∈[-76,+51]mm · 3 of 36 slices shown (2 of 2)]
[im 6/36]
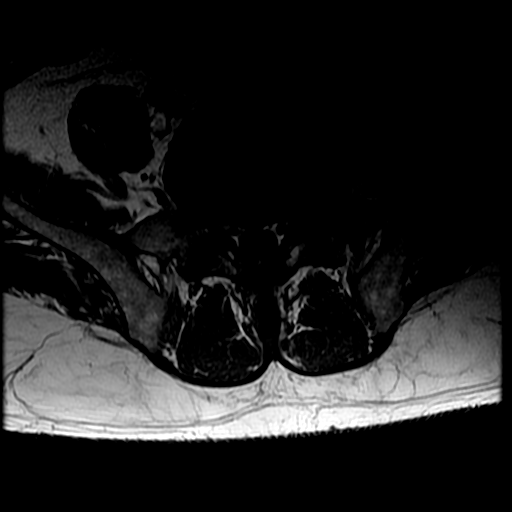
[im 18/36]
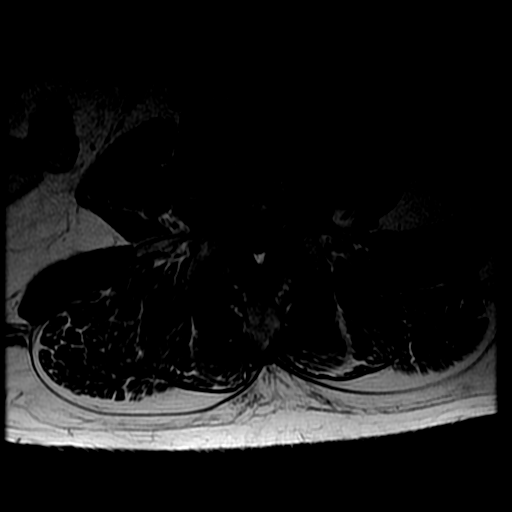
[im 31/36]
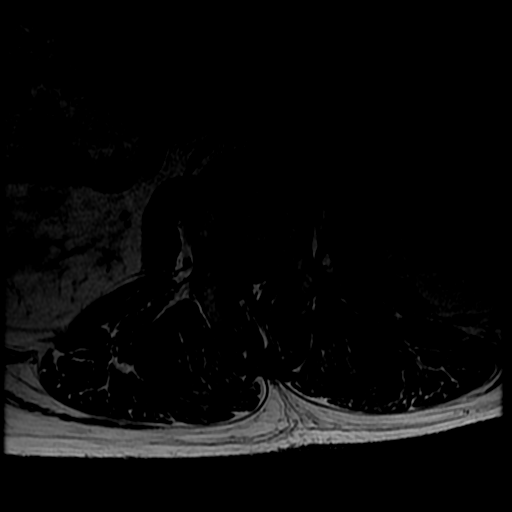

[18 of 48 positions shown; findings below may reference images not displayed]

FINDINGS: Segmentation:  Normal

Alignment:  Standard

Vertebrae: There is mild heterogeneity of the marrow signal. Contour
abnormality of the anterior wall of L4 is likely due to old trauma
or, more likely, chronic degenerative disease. No evidence of
discitis osteomyelitis.

Conus medullaris: Extends to the L1 level and appears normal.

Paraspinal and other soft tissues: The visualized aorta, IVC and
iliac vessels are normal. The visualized retroperitoneal organs and
paraspinal soft tissues are normal.

Disc levels:

T12-L1: Normal disc space and facets. No spinal canal or
neuroforaminal stenosis.

L1-L2: Normal disc space and facets. No spinal canal or
neuroforaminal stenosis.

L2-L3: Disc desiccation with small bulge. No spinal canal or neural
foraminal stenosis.

L3-L4: Disc desiccation and medium-sized diffuse bulge. No spinal
canal stenosis. Narrowing of both lateral recesses and mild
bilateral foraminal stenosis.

L4-L5: Disc desiccation with diffuse bulge and moderate bilateral
facet hypertrophy. Mild narrowing of the spinal canal. Mild right
and moderate left foraminal stenosis.

L5-S1: Disc desiccation with central disc protrusion and moderate
bilateral facet hypertrophy. No spinal canal stenosis. Moderate
bilateral foraminal stenosis.

Visualized sacrum: Normal.
IMPRESSION: 1. Contour abnormalities of the L4 vertebral body seen on the prior
radiograph is likely due to chronic degenerative osteophyte
formation or, less likely, remote trauma. The MRI appearance does
not suggest a neoplastic, inflammatory or infectious process.
2. Multilevel lower lumbar facet arthrosis and degenerative disc
disease with moderate foraminal narrowing at left L4-5 and bilateral
L5-S1.
3. Multifactorial mild spinal canal stenosis at L4-L5.

## 2018-01-03 ENCOUNTER — Ambulatory Visit: Payer: Medicare Other | Admitting: Podiatry

## 2018-01-13 ENCOUNTER — Other Ambulatory Visit: Payer: Self-pay

## 2018-01-13 NOTE — Patient Outreach (Signed)
[facility]) Care Management  01/13/2018  Kevin Soto 06-12-1960 [id number]   Medication Adherence call to Kevin Soto patient's telephone number is disconnected under [facility] on Epic the number belongs to someone else. Kevin Soto is showing past due under [place].  [place] Management Direct Dial [phone]  Fax [phone] [email or web address]

## 2018-01-30 DIAGNOSIS — R3915 Urgency of urination: Secondary | ICD-10-CM | POA: Diagnosis not present

## 2018-02-05 ENCOUNTER — Ambulatory Visit: Payer: Medicare Other | Admitting: Podiatry

## 2018-02-25 DIAGNOSIS — T85398D Other mechanical complication of other ocular prosthetic devices, implants and grafts, subsequent encounter: Secondary | ICD-10-CM | POA: Diagnosis not present

## 2018-02-25 DIAGNOSIS — H3582 Retinal ischemia: Secondary | ICD-10-CM | POA: Diagnosis not present

## 2018-02-25 DIAGNOSIS — H2522 Age-related cataract, morgagnian type, left eye: Secondary | ICD-10-CM | POA: Diagnosis not present

## 2018-02-25 DIAGNOSIS — E113591 Type 2 diabetes mellitus with proliferative diabetic retinopathy without macular edema, right eye: Secondary | ICD-10-CM | POA: Diagnosis not present

## 2018-03-03 DIAGNOSIS — N184 Chronic kidney disease, stage 4 (severe): Secondary | ICD-10-CM | POA: Diagnosis not present

## 2018-03-03 DIAGNOSIS — R3915 Urgency of urination: Secondary | ICD-10-CM | POA: Diagnosis not present

## 2018-03-14 ENCOUNTER — Ambulatory Visit: Payer: Medicare Other | Admitting: Podiatry

## 2018-03-18 ENCOUNTER — Other Ambulatory Visit: Payer: Self-pay | Admitting: *Deleted

## 2018-03-18 MED ORDER — CLOPIDOGREL BISULFATE 75 MG PO TABS: 75.0000 mg | ORAL_TABLET | Freq: Every day | ORAL | 1 refills | Status: DC

## 2018-03-18 MED ORDER — HYDROCHLOROTHIAZIDE 25 MG PO TABS: 25.0000 mg | ORAL_TABLET | Freq: Every day | ORAL | 1 refills | Status: DC

## 2018-03-18 MED ORDER — CARVEDILOL 25 MG PO TABS: 25.0000 mg | ORAL_TABLET | Freq: Two times a day (BID) | ORAL | 1 refills | Status: DC

## 2018-03-26 ENCOUNTER — Encounter: Payer: Self-pay | Admitting: Cardiology

## 2018-03-26 NOTE — Progress Notes (Signed)
Cardiology Office Note   Date:  03/27/2018   ID:  Kevin Soto, DOB 1960-10-13, MRN [id number]  PCP:  Corinne Ports, MD  Cardiologist:   No primary care provider on file.   Chief Complaint  Patient presents with  . Coronary Artery Disease      History of Present Illness: Kevin Soto is a 57 y.o. male who presents for follow up of CAD.  Since I last saw him he has had radiation seed implant in therapy for prostate cancer and is doing well.  He exercises routinely.  He swims a couple of days.  He does sit ups and push-ups. The patient denies any new symptoms such as chest discomfort, neck or arm discomfort. There has been no new shortness of breath, PND or orthopnea. There have been no reported palpitations, presyncope or syncope.   Past Medical History:  Diagnosis Date  . Anemia   . CAD (coronary artery disease)    cath 8/12:  LM ok, mLAD occluded with trivial collats from right AM and dRCA, mDx (small) 80%, RI 25%, tiny branch of OM 90%, mAM 30-40%, RCA sub-totally occluded, then 80%, EF 40%, inf HK.  He underwent PCI with [name] with placement of a Promus DES to the mRCA x 2  . Chronic kidney disease    stage 3 per chart  . Diabetes mellitus   . Dyslipidemia   . ED (erectile dysfunction)   . Gait instability 02/27/2011  . History of alcohol abuse   . History of stomach ulcers   . History of stroke    evidence of CVA on MRI in past  . Hyperlipidemia   . Hypertension   . Ischemic cardiomyopathy    echo 7/12: EF 40-45%, septal, apical, inf basal HK, mod LVH, mild MR, mild LAE.  EF 55% by echo 2015  . Macular hemorrhage   . Prostate cancer ([place])   . Sleep apnea    no c-pap; wife denies  . Vision, loss, sudden 02/27/2011    Past Surgical History:  Procedure Laterality Date  . CORONARY ANGIOPLASTY WITH STENT PLACEMENT  2012   2 stents  . RADIOACTIVE SEED IMPLANT N/A 05/08/2017   Procedure: RADIOACTIVE SEED IMPLANT/BRACHYTHERAPY IMPLANT;  Surgeon:  Alexis Frock, MD;  Location: [facility];  Service: Urology;  Laterality: N/A;  . REFRACTIVE SURGERY  2012   right and left eye/ per pt only left eye  . SPACE OAR INSTILLATION N/A 05/08/2017   Procedure: SPACE OAR INSTILLATION;  Surgeon: Alexis Frock, MD;  Location: [facility];  Service: Urology;  Laterality: N/A;     Current Outpatient Medications  Medication Sig Dispense Refill  . ACCU-CHEK FASTCLIX LANCETS MISC Use to check blood sugar two times a day 102 each 12  . aspirin EC 81 MG tablet Take 1 tablet (81 mg total) by mouth daily.    Marland Kitchen atorvastatin (LIPITOR) 40 MG tablet TAKE 1 TABLET BY MOUTH  DAILY 90 tablet 3  . Blood Glucose Monitoring Suppl (ACCU-CHEK GUIDE) w/Device KIT 1 each by Does not apply route 2 (two) times daily. 1 kit 0  . carvedilol (COREG) 25 MG tablet Take 1 tablet (25 mg total) by mouth 2 (two) times daily with a meal. 180 tablet 1  . clopidogrel (PLAVIX) 75 MG tablet Take 1 tablet (75 mg total) by mouth daily. 90 tablet 1  . fluticasone (FLONASE) 50 MCG/ACT nasal spray Place 2 sprays into both nostrils daily. 16 g  1  . glipiZIDE (GLUCOTROL) 10 MG tablet TAKE 1 TABLET BY MOUTH  TWICE A DAY BEFORE MEALS 180 tablet 3  . glucose blood (ACCU-CHEK GUIDE) test strip Use as instructed 100 each 12  . hydrochlorothiazide (HYDRODIURIL) 25 MG tablet Take 1 tablet (25 mg total) by mouth daily. 90 tablet 1  . lisinopril (PRINIVIL,ZESTRIL) 20 MG tablet TAKE 1 TABLET BY MOUTH  DAILY 90 tablet 3  . metFORMIN (GLUCOPHAGE) 500 MG tablet Take 1 tablet (500 mg total) by mouth 2 (two) times daily with a meal. 180 tablet 3  . nitroGLYCERIN (NITROSTAT) 0.4 MG SL tablet Place 1 tablet (0.4 mg total) under the tongue every 5 (five) minutes as needed. (Patient not taking: Reported on 07/02/2017) 100 tablet 1   No current facility-administered medications for this visit.     Allergies:   Clonidine derivatives    ROS:  Please see the history of present  illness.   Otherwise, review of systems are positive for none.   All other systems are reviewed and negative.    PHYSICAL EXAM: VS:  BP 94/64 (BP Location: Left Arm, Patient Position: Sitting, Cuff Size: Large)   Pulse 72   Ht 6' (1.829 m)   Wt 217 lb (98.4 kg)   BMI 29.43 kg/m  , BMI Body mass index is 29.43 kg/m. GENERAL:  Well appearing NECK:  No jugular venous distention, waveform within normal limits, carotid upstroke brisk and symmetric, no bruits, no thyromegaly LUNGS:  Clear to auscultation bilaterally CHEST:  Unremarkable HEART:  PMI not displaced or sustained,S1 and S2 within normal limits, no S3, no S4, no clicks, no rubs, no murmurs ABD:  Flat, positive bowel sounds normal in frequency in pitch, no bruits, no rebound, no guarding, no midline pulsatile mass, no hepatomegaly, no splenomegaly EXT:  2 plus pulses throughout, no edema, no cyanosis no clubbing    EKG:  EKG is ordered today. The ekg ordered today demonstrates sinus rhythm, rate 72, axis within normal limits, intervals within normal limits, no acute ST-T wave changes.   Recent Labs: 05/02/2017: ALT 27; BUN 36; Creatinine, Ser 1.69; Hemoglobin 11.5; Platelets 282; Potassium 4.5; Sodium 135    Lipid Panel    Component Value Date/Time   CHOL 149 05/08/2016 1457   TRIG 89 05/08/2016 1457   HDL 39 (L) 05/08/2016 1457   CHOLHDL 3.8 05/08/2016 1457   CHOLHDL 3.3 08/17/2014 1532   VLDL 17 08/17/2014 1532   LDLCALC 92 05/08/2016 1457      Wt Readings from Last 3 Encounters:  03/27/18 217 lb (98.4 kg)  11/12/17 213 lb 14.4 oz (97 kg)  11/05/17 214 lb (97.1 kg)      Other studies Reviewed: Additional studies/ records that were reviewed today include: Labs. Review of the above records demonstrates:  Please see elsewhere in the note.     ASSESSMENT AND PLAN:  CAD:   The patient has no new sypmtoms.  No further cardiovascular testing is indicated.  We will continue with aggressive risk reduction and meds  as listed.  HTN:   The blood pressure is at target. No change in medications is indicated. We will continue with therapeutic lifestyle changes (TLC).  DYSLIPIDEMIA:    I do not see lipids in the last couple of years ago come back for lipid profile with a goal LDL less than 70.  DM: A1c was 7.4.  Therapy per Dorrell, Andree Elk, MD   Current medicines are reviewed at length with the patient today.  The patient does not have concerns regarding medicines.  The following changes have been made:  no change  Labs/ tests ordered today include:   Orders Placed This Encounter  Procedures  . Lipid panel  . Hepatic function panel  . EKG 12-Lead     Disposition:   FU with me in one year.     Signed, Minus Breeding, MD  03/27/2018 3:17 PM    [facility]

## 2018-03-27 ENCOUNTER — Ambulatory Visit: Payer: Medicare Other | Admitting: Cardiology

## 2018-03-27 ENCOUNTER — Encounter: Payer: Self-pay | Admitting: Cardiology

## 2018-03-27 VITALS — BP 94/64 | HR 72 | Ht 72.0 in | Wt 217.0 lb

## 2018-03-27 DIAGNOSIS — I25119 Atherosclerotic heart disease of native coronary artery with unspecified angina pectoris: Secondary | ICD-10-CM | POA: Diagnosis not present

## 2018-03-27 DIAGNOSIS — E785 Hyperlipidemia, unspecified: Secondary | ICD-10-CM

## 2018-03-27 DIAGNOSIS — I10 Essential (primary) hypertension: Secondary | ICD-10-CM

## 2018-03-27 DIAGNOSIS — E1169 Type 2 diabetes mellitus with other specified complication: Secondary | ICD-10-CM

## 2018-03-27 NOTE — Patient Instructions (Signed)
Medication Instructions:  Continue current medications  If you need a refill on your cardiac medications before your next appointment, please call your pharmacy.  Labwork: Fasting Lipids and Liver  If you have labs (blood work) drawn today and your tests are completely normal, you will receive your results only by: Marland Kitchen MyChart Message (if you have MyChart) OR . A paper copy in the mail If you have any lab test that is abnormal or we need to change your treatment, we will call you to review the results.  Testing/Procedures: None Ordered  Follow-Up: You will need a follow up appointment in 1 Year.  Please call our office 2 months in advance([phone]) to schedule the (1 Year) appointment.  You may see  DR Hohcrein, or one of the following Advanced Practice Providers on your designated Care Team:    . Jory Sims, DNP, ANP . Rhonda Barrett, PA-C  . Kerin Ransom, [place]  . Almyra Deforest, PA-C . Fabian Sharp, PA-C  At [facility], you and your health needs are our priority.  As part of our continuing mission to provide you with exceptional heart care, we have created designated Provider Care Teams.  These Care Teams include your primary Cardiologist (physician) and Advanced Practice Providers (APPs -  Physician Assistants and Nurse Practitioners) who all work together to provide you with the care you need, when you need it.   Thank you for choosing [facility] at [facility]!!

## 2018-04-25 ENCOUNTER — Ambulatory Visit: Payer: Medicare Other | Admitting: Podiatry

## 2018-05-06 ENCOUNTER — Encounter: Payer: Self-pay | Admitting: Internal Medicine

## 2018-05-06 ENCOUNTER — Other Ambulatory Visit: Payer: Self-pay

## 2018-05-06 ENCOUNTER — Ambulatory Visit (INDEPENDENT_AMBULATORY_CARE_PROVIDER_SITE_OTHER): Payer: Medicare Other | Admitting: Internal Medicine

## 2018-05-06 VITALS — BP 126/80 | HR 70 | Temp 97.9°F | Ht 72.0 in | Wt 218.4 lb

## 2018-05-06 DIAGNOSIS — Z79899 Other long term (current) drug therapy: Secondary | ICD-10-CM

## 2018-05-06 DIAGNOSIS — E1159 Type 2 diabetes mellitus with other circulatory complications: Secondary | ICD-10-CM

## 2018-05-06 DIAGNOSIS — E1122 Type 2 diabetes mellitus with diabetic chronic kidney disease: Secondary | ICD-10-CM | POA: Diagnosis not present

## 2018-05-06 DIAGNOSIS — I152 Hypertension secondary to endocrine disorders: Secondary | ICD-10-CM

## 2018-05-06 DIAGNOSIS — I10 Essential (primary) hypertension: Secondary | ICD-10-CM | POA: Diagnosis not present

## 2018-05-06 DIAGNOSIS — E1149 Type 2 diabetes mellitus with other diabetic neurological complication: Secondary | ICD-10-CM | POA: Diagnosis not present

## 2018-05-06 DIAGNOSIS — R413 Other amnesia: Secondary | ICD-10-CM | POA: Diagnosis not present

## 2018-05-06 DIAGNOSIS — I2510 Atherosclerotic heart disease of native coronary artery without angina pectoris: Secondary | ICD-10-CM

## 2018-05-06 DIAGNOSIS — Z0000 Encounter for general adult medical examination without abnormal findings: Secondary | ICD-10-CM

## 2018-05-06 DIAGNOSIS — I129 Hypertensive chronic kidney disease with stage 1 through stage 4 chronic kidney disease, or unspecified chronic kidney disease: Secondary | ICD-10-CM

## 2018-05-06 DIAGNOSIS — Z7984 Long term (current) use of oral hypoglycemic drugs: Secondary | ICD-10-CM

## 2018-05-06 DIAGNOSIS — H539 Unspecified visual disturbance: Secondary | ICD-10-CM

## 2018-05-06 DIAGNOSIS — H5462 Unqualified visual loss, left eye, normal vision right eye: Secondary | ICD-10-CM

## 2018-05-06 DIAGNOSIS — N182 Chronic kidney disease, stage 2 (mild): Secondary | ICD-10-CM

## 2018-05-06 DIAGNOSIS — Z8673 Personal history of transient ischemic attack (TIA), and cerebral infarction without residual deficits: Secondary | ICD-10-CM

## 2018-05-06 DIAGNOSIS — Z8546 Personal history of malignant neoplasm of prostate: Secondary | ICD-10-CM

## 2018-05-06 LAB — POCT GLYCOSYLATED HEMOGLOBIN (HGB A1C): HEMOGLOBIN A1C: 10.3 % — AB (ref 4.0–5.6)

## 2018-05-06 LAB — GLUCOSE, CAPILLARY: GLUCOSE-CAPILLARY: 220 mg/dL — AB (ref 70–99)

## 2018-05-06 MED ORDER — METFORMIN HCL 500 MG PO TABS: 1000.0000 mg | ORAL_TABLET | Freq: Two times a day (BID) | ORAL | 3 refills | Status: DC

## 2018-05-06 MED ORDER — EMPAGLIFLOZIN 10 MG PO TABS: 10.0000 mg | ORAL_TABLET | Freq: Every day | ORAL | Status: DC

## 2018-05-06 NOTE — Progress Notes (Signed)
   CC: BP and DM f/u  HPI:   [name] is a 57 y.o. male with a history of CAD, CVA, HTN, DMII, CKDII, and prostate cancer s/p brachytherapy as well as the other medical conditions listed below who presents to the internal medicine clinic for BP and DM f/u. We also discussed memory changes at this visit. Please see problem based charting for the history and status of the patient's current and chronic medical conditions.   Past Medical History:  Diagnosis Date  . Anemia   . CAD (coronary artery disease)    cath 8/12:  LM ok, mLAD occluded with trivial collats from right AM and dRCA, mDx (small) 80%, RI 25%, tiny branch of OM 90%, mAM 30-40%, RCA sub-totally occluded, then 80%, EF 40%, inf HK.  He underwent PCI with [name] with placement of a Promus DES to the mRCA x 2  . Chronic kidney disease    stage 3 per chart  . Diabetes mellitus   . Dyslipidemia   . ED (erectile dysfunction)   . Gait instability 02/27/2011  . History of alcohol abuse   . History of stomach ulcers   . History of stroke    evidence of CVA on MRI in past  . Hyperlipidemia   . Hypertension   . Ischemic cardiomyopathy    echo 7/12: EF 40-45%, septal, apical, inf basal HK, mod LVH, mild MR, mild LAE.  EF 55% by echo 2015  . Macular hemorrhage   . Prostate cancer ([place])   . Sleep apnea    no c-pap; wife denies  . Vision, loss, sudden 02/27/2011    Review of Systems:   Pertinent positives mentioned in HPI. Remainder of all ROS negative.  Physical Exam: Vitals:   05/06/18 1555  BP: 126/80  Pulse: 70  Temp: 97.9 F (36.6 C)  TempSrc: Oral  SpO2: 100%  Weight: 218 lb 6.4 oz (99.1 kg)  Height: 6' (1.829 m)   Physical Exam  Constitutional: Well-developed, well-nourished, and in no distress.  Eyes: Left pupil with focal silver opacity. Decreased vision in the left eye. EOM are normal.  Cardiovascular: Normal rate and regular rhythm. No murmurs, rubs, or gallops. Pulmonary/Chest: Effort normal.  Clear to auscultation bilaterally. No wheezes, rales, or rhonchi. Abdominal: Bowel sounds present. Soft, non-distended, non-tender. Ext: No lower extremity edema. Skin: Warm and dry. No rashes or wounds. Neuro: Alert and oriented x3. No cranial nerve deficits. Normal strength and sensation throughout. Normal gait.   Assessment & Plan:   See Encounters Tab for problem based charting.  Patient seen with Dr. Angelia Mould

## 2018-05-06 NOTE — Patient Instructions (Addendum)
Please record the time, amount and what food drinks and activities you have while wearing the continuous glucose monitor(CGM).  Bring the folder with you to follow up appointments  Do not have a CT or an MRI while wearing the CGM.   Please make an appointment for 1 week with me and a doctor for the first of two CGM downloads..   You will also return in 2 weeks to have your second download and the CGM remove  [name] [phone]  It was a pleasure taking care of you today, Mr. Cosma.  For your diabetes - Start taking Jardiance 10mg  daily. - Increase your metformin to 1,000mg  twice a day.    If you are on 500mg  once daily, start to take 500mg  in the evening as well. After 5 days, increase to 1,000mg  in the morning and 500mg  at night. Then after 5 more days, increase to 1,000mg  in the morning and 1,000mg  at night.  - Please return in 7 days with [place] for Download #1.   For your memory changes - We will get labs today. I will call you your results.   Feel free to call our clinic at [phone] if you have any questions.  Thanks! Dr. Annie Paras

## 2018-05-06 NOTE — Assessment & Plan Note (Signed)
BP 126/80 today. Well controlled on current regimen.  Plan 1. Continue lisinopril 20mg  daily, hctz 25mg  daily, carvedilol 25mg  BID, and tamsulosin 0.4mg  daily.

## 2018-05-07 ENCOUNTER — Telehealth: Payer: Self-pay | Admitting: Dietician

## 2018-05-07 ENCOUNTER — Encounter: Payer: Self-pay | Admitting: Internal Medicine

## 2018-05-07 DIAGNOSIS — H539 Unspecified visual disturbance: Secondary | ICD-10-CM | POA: Insufficient documentation

## 2018-05-07 DIAGNOSIS — R413 Other amnesia: Secondary | ICD-10-CM | POA: Insufficient documentation

## 2018-05-07 LAB — LIPID PANEL
CHOL/HDL RATIO: 3.6 ratio (ref 0.0–5.0)
CHOLESTEROL TOTAL: 192 mg/dL (ref 100–199)
HDL: 54 mg/dL (ref >39)
LDL CALC: 107 mg/dL — AB (ref 0–99)
Triglycerides: 153 mg/dL — ABNORMAL HIGH (ref 0–149)
VLDL Cholesterol Cal: 31 mg/dL (ref 5–40)

## 2018-05-07 LAB — VITAMIN B12: Vitamin B-12: 531 pg/mL (ref 232–1245)

## 2018-05-07 LAB — TSH: TSH: 1.78 u[IU]/mL (ref 0.450–4.500)

## 2018-05-07 NOTE — Telephone Encounter (Signed)
Received a call from patient's wife. The bladder medicines he was prescribed from Dr. Tresa Moore were Tolterodine and Tartrate. She will call back with the doses and frequency.  She asked about next steps for her husband's memory issues since the tests were all normal. She'd like to discuss this with Dr. Annie Paras.

## 2018-05-07 NOTE — Assessment & Plan Note (Signed)
HPI: Routine neuro exam showed a silvery opacity over the left pupil. Upon questioning, Kevin Soto reports that he has had decreased vision in that eye for about four months. He was unable to describe the change in vision further, but denied any flashes or floaters. He also denied eye pain and headaches. He has an ophthalmology appointment already scheduled.   Assessment: Vision was decreased on the left compared to the right. This opacity may represent a focal cataract. I do not think it is an ophthalmological emergency and can wait until his upcoming ophtho appointment.  Plan 1. Address with ophthalmologist at upcoming appointment

## 2018-05-07 NOTE — Telephone Encounter (Signed)
I spoke with his wife about a plan going forward for his memory changes. I will see him in February to perform a MOCA and consider brain imaging.

## 2018-05-07 NOTE — Telephone Encounter (Signed)
Called patient and spoke to his wife. informed her of appointment for CGM download #1 on Monday 12/23. She also asked about his lab results which I read to her. She did not have nay questions.  She is concerned about Angie's overactive bladder and says that Dr. Tresa Moore his urologist prescribed a medicine for this, but that Mr Isa could not afford it. She will find out what the name if the medicine was to let us know to see if our pharmacist can help him afford it.

## 2018-05-07 NOTE — Telephone Encounter (Signed)
Kevin Soto, patient's wife calls back to claify the medicine prescribed by his urologist was tolterodine tartrate 1mg  tablet twice daily.

## 2018-05-07 NOTE — Assessment & Plan Note (Signed)
HPI: Kevin Soto is accompanied to clinic by his sister-in-law today (his wife's twin sister). She reports that his wife has had concerns about his short term memory recently. The only example of a lapse in memory she could give me was Kevin Soto repeating questions after he had already heard the answer to them. Otherwise, they both are unsure of the wife's concerns. Kevin Soto himself does not think he has had any memory changes.   Assessment: Normal orientation and neuro exam today. TSH and B12 levels were wnl. He takes no medications that are likely to cause changes in mentation. I spoke with the patient's wife over the phone about her concerns. She agrees to come back to clinic 06/24/2018 for a visit dedicated to his memory. At this time we can do a MOCA and consider imaging. The wife is comfortable with this plan as she feels his memory is not rapidly declining.   Plan 1. Reassess with MOCA at visit in 2 months

## 2018-05-07 NOTE — Assessment & Plan Note (Signed)
Received flu vaccine today

## 2018-05-07 NOTE — Assessment & Plan Note (Addendum)
A1c has increased from 7.4 6 months ago to 10.3 today. Current regimen includes metformin 500mg  BID and glipizide 10mg  BID. He reports that he has been compliant with his medications. He does not regularly check his blood sugars at home. He denies hypoglycemic symptoms. I spoke with the patient about dietary changes he can make to prevent high blood sugars. Because we have no data on his glucose levels throughout the day, will do CGM. As for medication changes, will increase his metformin dose and start Jardiance. Consider discontinuing glipizide to avoid hypoglycemia if CGM shows hypoglycemic episodes or good control.   Plan 1. Increase metformin from 500mg  BID to 1,000mg  BID. Advised patient about appropriate titration schedule. 2. Continue glipizide 10mg  BID 3. Start Jardiance 10mg  daily. He was provided with a one-week sample. He will require a prescription at his next visit. 4. CGM sensor placed today by [name].  5. F/u in 1 week for CGM download #1

## 2018-05-07 NOTE — Progress Notes (Signed)
Documentation for Freestyle Libre Pro Continuous glucose monitoring Freestyle Libre Pro CGM sensor placed today. Patient and his sister in law were educated about wearing sensor, keeping food, activity and medication log and when to call office. Patient was educated about how to care for the sensor and not to have an MRI, CT or Diathermy while wearing the sensor. Follow up was arranged with the patient for 1 week.   Lot #: [id number] Serial #: [id number] Expiration Date: 09/18/2018  Debera Lat, RD 05/07/2018 9:34 AM.

## 2018-05-09 NOTE — Progress Notes (Signed)
Internal Medicine Clinic Attending  I saw and evaluated the patient.  I personally confirmed the key portions of the history and exam documented by Dr. Dorrell and I reviewed pertinent patient test results.  The assessment, diagnosis, and plan were formulated together and I agree with the documentation in the resident's note. 

## 2018-05-12 ENCOUNTER — Ambulatory Visit (INDEPENDENT_AMBULATORY_CARE_PROVIDER_SITE_OTHER): Payer: Medicare Other | Admitting: Dietician

## 2018-05-12 ENCOUNTER — Ambulatory Visit (INDEPENDENT_AMBULATORY_CARE_PROVIDER_SITE_OTHER): Payer: Medicare Other | Admitting: Internal Medicine

## 2018-05-12 ENCOUNTER — Other Ambulatory Visit: Payer: Self-pay

## 2018-05-12 ENCOUNTER — Encounter: Payer: Self-pay | Admitting: Internal Medicine

## 2018-05-12 ENCOUNTER — Encounter: Payer: Self-pay | Admitting: Dietician

## 2018-05-12 DIAGNOSIS — E1149 Type 2 diabetes mellitus with other diabetic neurological complication: Secondary | ICD-10-CM | POA: Diagnosis not present

## 2018-05-12 DIAGNOSIS — I10 Essential (primary) hypertension: Secondary | ICD-10-CM

## 2018-05-12 DIAGNOSIS — E1159 Type 2 diabetes mellitus with other circulatory complications: Secondary | ICD-10-CM | POA: Diagnosis not present

## 2018-05-12 DIAGNOSIS — Z7984 Long term (current) use of oral hypoglycemic drugs: Secondary | ICD-10-CM

## 2018-05-12 DIAGNOSIS — Z79899 Other long term (current) drug therapy: Secondary | ICD-10-CM | POA: Diagnosis not present

## 2018-05-12 MED ORDER — EMPAGLIFLOZIN 10 MG PO TABS: 10.0000 mg | ORAL_TABLET | Freq: Every day | ORAL | 6 refills | Status: DC

## 2018-05-12 NOTE — Progress Notes (Signed)
Initial visit:  MNT1  Medical Nutrition Therapy:  Appt start time: 1520 end time:  6599.  Assessment:  Primary concerns today: CGM download and blood sugar control.  Mr. Decaire is here with his sister in law for his first cgm download. He reports no signs and symptoms of low blood sugar.  Usual eating pattern includes 3 meals and 3 snacks per day. 24-hr recall: B ( 9-10AM)- mcdonalds,  ; L ( PM)- broiled fish, baked potato ; D ( PM)-chicken salad sandwich ; Snk ( PM)- popcorn  MEDICATIONS glipizide and metformin am and PM with breakfast and dinner, jardiance every am  Usual physical activity includes works out at gym 2-3 days a week .  Progress Towards Goal(s):  Some progress.   Nutritional Diagnosis:  NB-1.6 Limited adherence to nutrition-related recommendations As related to eating foods that increase blood sugars as well as larger portions of those foods.  As evidenced by his and his wife's report.    Intervention:  Nutrition education about food recommended and how to compensate with physical activity.  Monitoring/Evaluation:  Dietary intake, exercise, Continuous glucose monitor, and body weight in 1 week(s)  Debera Lat, RD 05/12/2018 4:26 PM. .

## 2018-05-12 NOTE — Patient Instructions (Addendum)
Great job lowering your blood sugars.   They are in target and well controlled for the past week!  Keep exercising! It is best to do some physical activity every day -even if it is a 5 minute walk after each meal to help control your blood sugar.   PLease make a final Appointment late in day either Monday or Thursday next week. This will be the 2nd and final download of the Continuous glucose monitor. We will remove that day also.  [name] [phone]

## 2018-05-12 NOTE — Patient Instructions (Signed)
Thank you for visiting clinic today. Looks like your blood sugar is improving and you are taking good care of yourself. I am stopping your glipizide because of few episodes of low blood sugar. Continue taking your Metformin and Jardiance. Please follow-up in 1 week for your second interpretation, if we see your blood sugar continue to spike we might add another agent.

## 2018-05-12 NOTE — Progress Notes (Signed)
   CC: For 1 week CGM interpretation.  HPI:  [name] is a 57 y.o. past medical history as listed below came to the clinic for his 1 week of CGM interpretation.  Please see assessment and plan for his chronic conditions.  Past Medical History:  Diagnosis Date  . Anemia   . CAD (coronary artery disease)    cath 8/12:  LM ok, mLAD occluded with trivial collats from right AM and dRCA, mDx (small) 80%, RI 25%, tiny branch of OM 90%, mAM 30-40%, RCA sub-totally occluded, then 80%, EF 40%, inf HK.  He underwent PCI with [name] with placement of a Promus DES to the mRCA x 2  . Chronic kidney disease    stage 3 per chart  . Diabetes mellitus   . Dyslipidemia   . ED (erectile dysfunction)   . Gait instability 02/27/2011  . History of alcohol abuse   . History of stomach ulcers   . History of stroke    evidence of CVA on MRI in past  . Hyperlipidemia   . Hypertension   . Ischemic cardiomyopathy    echo 7/12: EF 40-45%, septal, apical, inf basal HK, mod LVH, mild MR, mild LAE.  EF 55% by echo 2015  . Macular hemorrhage   . Prostate cancer ([place])   . Sleep apnea    no c-pap; wife denies  . Vision, loss, sudden 02/27/2011   Review of Systems: Negative except mentioned in HPI.  Physical Exam:  Vitals:   05/12/18 1602  BP: 139/74  Pulse: 73  Temp: 98.5 F (36.9 C)  TempSrc: Oral  SpO2: 100%  Weight: 213 lb 11.2 oz (96.9 kg)  Height: 6' (1.829 m)   General: Vital signs reviewed.  Patient is well-developed and well-nourished, in no acute distress and cooperative with exam.  Head: Normocephalic and atraumatic. Cardiovascular: RRR, S1 normal, S2 normal, no murmurs, gallops, or rubs. Pulmonary/Chest: Clear to auscultation bilaterally, no wheezes, rales, or rhonchi. Abdominal: Soft, non-tender, non-distended, BS +, no masses, organomegaly, or guarding present.  Extremities: No lower extremity edema bilaterally,  pulses symmetric and intact bilaterally. No cyanosis or  clubbing. Skin: Warm, dry and intact. No rashes or erythema. Psychiatric: Normal mood and affect. speech and behavior is normal. Cognition and memory are normal.  Assessment & Plan:   See Encounters Tab for problem based charting.  Patient discussed with Dr. Evette Doffing.

## 2018-05-12 NOTE — Assessment & Plan Note (Signed)
Kevin Soto wore the CGM for 7 days. The average reading was 138, % time in target was 76, % time below target was 6, and % time above target was. 18. Intervention will be to discontinue glipizide. The patient will be scheduled to see in 1 week for a final appointment.   Patient shows improvement in his blood sugar with CGM.  He was able to pinpoint about certain diets causing spikes in his blood sugar. Patient did have multiple readings with blood sugar below 70, mostly in the later half of night, patient remained asymptomatic. Those hypoglycemic readings can be an miss reading of hypoglycemia with CGM. He was also started on Jardiance along with his metformin and glipizide.  -We will discontinue glipizide at this time. -He will continue Metformin and Jardiance, sample for 7 days was provided with a prescription to his mail-in pharmacy. -Patient shows hyperglycemia, will get benefit with addition of GLP-1 as it will also help in weight loss.

## 2018-05-12 NOTE — Assessment & Plan Note (Signed)
BP Readings from Last 3 Encounters:  05/12/18 139/74  05/06/18 126/80  03/27/18 94/64   His blood pressure was mildly elevated today.  We will continue with current management and reevaluate during next follow-up visit.

## 2018-05-16 NOTE — Progress Notes (Signed)
Internal Medicine Clinic Attending  Case discussed with Dr. Amin at the time of the visit.  We reviewed the resident's history and exam and pertinent patient test results.  I agree with the assessment, diagnosis, and plan of care documented in the resident's note.    

## 2018-05-19 ENCOUNTER — Ambulatory Visit (INDEPENDENT_AMBULATORY_CARE_PROVIDER_SITE_OTHER): Payer: Medicare Other | Admitting: Internal Medicine

## 2018-05-19 ENCOUNTER — Encounter: Payer: Self-pay | Admitting: Internal Medicine

## 2018-05-19 ENCOUNTER — Encounter: Payer: Self-pay | Admitting: Dietician

## 2018-05-19 ENCOUNTER — Other Ambulatory Visit: Payer: Self-pay

## 2018-05-19 ENCOUNTER — Ambulatory Visit: Payer: Medicare Other | Admitting: Dietician

## 2018-05-19 VITALS — BP 125/67 | HR 72 | Temp 98.4°F | Ht 72.0 in | Wt 213.5 lb

## 2018-05-19 DIAGNOSIS — E1149 Type 2 diabetes mellitus with other diabetic neurological complication: Secondary | ICD-10-CM | POA: Diagnosis not present

## 2018-05-19 DIAGNOSIS — Z7984 Long term (current) use of oral hypoglycemic drugs: Secondary | ICD-10-CM | POA: Diagnosis not present

## 2018-05-19 MED ORDER — EMPAGLIFLOZIN-METFORMIN HCL 5-1000 MG PO TABS: 1.0000 | ORAL_TABLET | Freq: Two times a day (BID) | ORAL | 2 refills | Status: DC

## 2018-05-19 MED ORDER — DULAGLUTIDE 0.75 MG/0.5ML ~~LOC~~ SOAJ: 0.7500 mg | SUBCUTANEOUS | 2 refills | Status: DC

## 2018-05-19 NOTE — Progress Notes (Signed)
Downloaded and removed Kevin Soto's CGM sensor. Educated Reports  Kevin. Schwinn and his wife about how to interpret the report. discussed his care with Dr. Philipp Ovens. Encourage follow up annually for diabetes self management training.  Debera Lat, RD 05/19/2018 5:46 PM.

## 2018-05-19 NOTE — Patient Instructions (Addendum)
Kevin Soto,  It was a pleasure to meet you! Please stop taking your metformin and your Jardiance. Instead, start taking synjardy 1 tablet twice daily. This is a combination pill with both metformin and jardiance in it. I have also started you on trulicity. Please inject 1/2 a mL (0.75 mg) once a week. Follow up with [place] again in 4-6 weeks.   If you have any questions or concerns, call our clinic at [phone] or after hours call [phone] and ask for the internal medicine resident on call. Thank you!  Dr. Philipp Ovens

## 2018-05-19 NOTE — Progress Notes (Signed)
   CC: Diabetes follow up  HPI:  [name] is a 57 y.o. male with past medical history outlined below here for diabetes follow up. For the details of today's visit, please refer to the assessment and plan.  Past Medical History:  Diagnosis Date  . Anemia   . CAD (coronary artery disease)    cath 8/12:  LM ok, mLAD occluded with trivial collats from right AM and dRCA, mDx (small) 80%, RI 25%, tiny branch of OM 90%, mAM 30-40%, RCA sub-totally occluded, then 80%, EF 40%, inf HK.  He underwent PCI with [name] with placement of a Promus DES to the mRCA x 2  . Chronic kidney disease    stage 3 per chart  . Diabetes mellitus   . Dyslipidemia   . ED (erectile dysfunction)   . Gait instability 02/27/2011  . History of alcohol abuse   . History of stomach ulcers   . History of stroke    evidence of CVA on MRI in past  . Hyperlipidemia   . Hypertension   . Ischemic cardiomyopathy    echo 7/12: EF 40-45%, septal, apical, inf basal HK, mod LVH, mild MR, mild LAE.  EF 55% by echo 2015  . Macular hemorrhage   . Prostate cancer ([place])   . Sleep apnea    no c-pap; wife denies  . Vision, loss, sudden 02/27/2011    Review of Systems  Respiratory: Negative for shortness of breath.   Cardiovascular: Negative for chest pain.    Physical Exam:  Vitals:   05/19/18 1607  BP: 125/67  Pulse: 72  Temp: 98.4 F (36.9 C)  TempSrc: Oral  SpO2: 100%  Weight: 213 lb 8 oz (96.8 kg)  Height: 6' (1.829 m)    Constitutional: NAD, appears comfortable  Cardiovascular: RRR, no murmurs, rubs, or gallops.  Pulmonary/Chest: CTAB, no wheezes, rales, or rhonchi.  Extremities: Warm and well perfused.No edema.  Psychiatric: Normal mood and affect  Assessment & Plan:   See Encounters Tab for problem based charting.  Patient discussed with Dr. Angelia Mould

## 2018-05-19 NOTE — Assessment & Plan Note (Signed)
Patient is here for diabetes follow up. This is week 2 of his CGM download. Last week, patient had multiple episodes of hypoglycemia and his glipizide was discontinued. Since then per download, he has not had any more episodes of hypoglycemia, but was above target range about 60% of the time. Average CBG ~ 200. Today we discussed trial with a with GLP-1 agonist. He is agreeable.  -- Stop metformin & jardiance, change to combination synjardy 09-998 mg BID -- Start truclicity 4.00 mg subq weekly -- Follow up 4-6 weeks with meter, uptitrate trulicity if needed

## 2018-05-26 NOTE — Progress Notes (Signed)
Internal Medicine Clinic Attending  Case discussed with Dr. Guilloud at the time of the visit.  We reviewed the resident's history and exam and pertinent patient test results.  I agree with the assessment, diagnosis, and plan of care documented in the resident's note.  

## 2018-06-10 ENCOUNTER — Ambulatory Visit: Payer: Medicare Other | Admitting: Podiatry

## 2018-06-11 ENCOUNTER — Ambulatory Visit: Payer: Medicare Other | Admitting: Podiatry

## 2018-06-12 ENCOUNTER — Other Ambulatory Visit: Payer: Self-pay

## 2018-06-12 ENCOUNTER — Encounter (INDEPENDENT_AMBULATORY_CARE_PROVIDER_SITE_OTHER): Payer: Self-pay

## 2018-06-12 ENCOUNTER — Telehealth: Payer: Self-pay | Admitting: Internal Medicine

## 2018-06-12 ENCOUNTER — Telehealth: Payer: Self-pay | Admitting: *Deleted

## 2018-06-12 ENCOUNTER — Encounter: Payer: Self-pay | Admitting: Internal Medicine

## 2018-06-12 ENCOUNTER — Ambulatory Visit (INDEPENDENT_AMBULATORY_CARE_PROVIDER_SITE_OTHER): Payer: Medicare Other | Admitting: Internal Medicine

## 2018-06-12 DIAGNOSIS — M25521 Pain in right elbow: Secondary | ICD-10-CM

## 2018-06-12 DIAGNOSIS — Z9181 History of falling: Secondary | ICD-10-CM | POA: Diagnosis not present

## 2018-06-12 DIAGNOSIS — M79652 Pain in left thigh: Secondary | ICD-10-CM | POA: Diagnosis not present

## 2018-06-12 NOTE — Telephone Encounter (Signed)
Call from pt's wife, Nigel Berthold - talked to pt who stated he is having left hip pain which started last night and now having  right elbow pain shooting to left hip which started this am. No c/o dizziness. [facility] appt given for today @ 1345 PM.

## 2018-06-12 NOTE — Progress Notes (Signed)
   CC: Elbow and hip pain  HPI:[name] is a 58 y.o. male who presents for evaluation of thigh and arm. Please see individual problem based A/P for details.  PHQ-9: Based on the patients    Office Visit from 06/12/2018 in [place]  PHQ-9 Total Score  0     score we have decided to monitor.  Past Medical History:  Diagnosis Date  . Anemia   . CAD (coronary artery disease)    cath 8/12:  LM ok, mLAD occluded with trivial collats from right AM and dRCA, mDx (small) 80%, RI 25%, tiny branch of OM 90%, mAM 30-40%, RCA sub-totally occluded, then 80%, EF 40%, inf HK.  He underwent PCI with [name] with placement of a Promus DES to the mRCA x 2  . Chronic kidney disease    stage 3 per chart  . Diabetes mellitus   . Dyslipidemia   . ED (erectile dysfunction)   . Gait instability 02/27/2011  . History of alcohol abuse   . History of stomach ulcers   . History of stroke    evidence of CVA on MRI in past  . Hyperlipidemia   . Hypertension   . Ischemic cardiomyopathy    echo 7/12: EF 40-45%, septal, apical, inf basal HK, mod LVH, mild MR, mild LAE.  EF 55% by echo 2015  . Macular hemorrhage   . Prostate cancer ([place])   . Sleep apnea    no c-pap; wife denies  . Vision, loss, sudden 02/27/2011   Review of Systems:  ROS negative except as per HPI.  Physical Exam: Vitals:   06/12/18 1415  BP: 136/73  Pulse: 80  Temp: 98.8 F (37.1 C)  TempSrc: Oral  SpO2: 100%  Weight: 213 lb 1.6 oz (96.7 kg)  Height: 6' (1.829 m)   General: A/O x4, in no acute distress, afebrile, nondiaphoretic Cardio: RRR, no mrg's Pulmonary: CTA bilaterally MSK: BLE nontender, nonedematous, there was mild tenderness of the left thigh. There was full painless ROM of the left/right hips. Full painless ROM of the right shoulder and elbow. No pain illicited of the right arm with palpation, active/passive motion. Strength was 5/5 upper and lower bilaterally.   Assessment &  Plan:   See Encounters Tab for problem based charting.  Patient discussed with Dr. Evette Doffing

## 2018-06-12 NOTE — Patient Outreach (Signed)
[facility]) Care Management  06/12/2018  MAXIME BECKNER Nov 08, 1960 [id number]   Medication Adherence call to Mr. Emile Kyllo  patient's telephone number is disconnected and Epic has a number but belongs to some else. Mr. Barnabas Harries is showing past due under [place].   [place] Management Direct Dial [phone]  Fax [phone] [email or web address]

## 2018-06-12 NOTE — Telephone Encounter (Signed)
Ok. Thank you.

## 2018-06-12 NOTE — Patient Instructions (Addendum)
Please keep the February 4th appointment with your primary care doctor.   Today we discussed your left sided pelvic pain and the episode of right arm pain that you experienced briefly this morning. Given your symptoms I feel you will recover well. As always if your symptoms worsen, fail to improve, or you develop other concerning symptoms, please notify our office or visit the local ER if we are unavailable. Symptoms including inability to walk, severe pain, return of the arm pain, should not be ignored and should encourage you to visit the ED if we are unavailable by phone or the symptoms are severe.  I would recommend tylenol 650mg  three times per day at most for the pain. Please do not use ibuprofen, motrin, goody powder, aleve or other similar therapies.   Thank you for your visit to the [facility] today. If you have any questions or concerns please call us at [phone].

## 2018-06-13 DIAGNOSIS — M25521 Pain in right elbow: Secondary | ICD-10-CM | POA: Insufficient documentation

## 2018-06-13 DIAGNOSIS — M79652 Pain in left thigh: Secondary | ICD-10-CM | POA: Insufficient documentation

## 2018-06-13 NOTE — Progress Notes (Signed)
Internal Medicine Clinic Attending  Case discussed with Dr. Harbrecht at the time of the visit.  We reviewed the resident's history and exam and pertinent patient test results.  I agree with the assessment, diagnosis, and plan of care documented in the resident's note.   

## 2018-06-13 NOTE — Assessment & Plan Note (Signed)
  Elbow pain: Transient, shooting, less than 5-15 seconds in duration that occurred early this morning and has not been present in the past nor has it reoccurred. No PE abnormallities, the pain could not be reproduced with an extensive ROM, strength and degree of palpation to the area. Uncertain what to make of this. Likely MSK vs brief nerve impingement. Denied chest pain, dyspnea, neck pain, or pain with activity.    Plan:  Advised to return if it reoccurs or worsens.

## 2018-06-13 NOTE — Assessment & Plan Note (Signed)
Thigh pain: The thigh pain began after a mild fall. It is located on the outer left thigh area. He denied bruising, head trauma, LOC. The pain is really only noticeable when he lies on his left thigh. It is mild at best and 3/10 at this time. He has not tried anything to improve the pain. He denied pain with ambulation or any particular motion.  PE was benign, no ROM limitations or pain with exam except on palpation of the left outer thigh where there was absence of ecchymosis or rash.  Likely a mild musculoskeletal inflammation.   Most recent PSA was unremarkable.   Plan: Tylenol 650mg  TID at most Return if symptoms worsen.

## 2018-06-23 NOTE — Progress Notes (Deleted)
   CC: ***  HPI:   Kevin Soto is a 58 y.o.  Please see problem based charting for the history and status of the patient's current and chronic medical conditions.   Past Medical History:  Diagnosis Date  . Anemia   . CAD (coronary artery disease)    cath 8/12:  LM ok, mLAD occluded with trivial collats from right AM and dRCA, mDx (small) 80%, RI 25%, tiny branch of OM 90%, mAM 30-40%, RCA sub-totally occluded, then 80%, EF 40%, inf HK.  He underwent PCI with [name] with placement of a Promus DES to the mRCA x 2  . Chronic kidney disease    stage 3 per chart  . Diabetes mellitus   . Dyslipidemia   . ED (erectile dysfunction)   . Gait instability 02/27/2011  . History of alcohol abuse   . History of stomach ulcers   . History of stroke    evidence of CVA on MRI in past  . Hyperlipidemia   . Hypertension   . Ischemic cardiomyopathy    echo 7/12: EF 40-45%, septal, apical, inf basal HK, mod LVH, mild MR, mild LAE.  EF 55% by echo 2015  . Macular hemorrhage   . Prostate cancer ([place])   . Sleep apnea    no c-pap; wife denies  . Vision, loss, sudden 02/27/2011    Review of Systems:   Pertinent positives mentioned in HPI. Remainder of all ROS negative.  Physical Exam: There were no vitals filed for this visit. *** Physical Exam  Constitutional: Well-developed, well-nourished, and in no distress.  Eyes: Pupils are equal, round, and reactive to light. EOM are normal.  Cardiovascular: Normal rate and regular rhythm. No murmurs, rubs, or gallops. Pulmonary/Chest: Effort normal. Clear to auscultation bilaterally. No wheezes, rales, or rhonchi. Abdominal: Bowel sounds present. Soft, non-distended, non-tender. Ext: No lower extremity edema. Skin: Warm and dry. No rashes or wounds.   Assessment & Plan:   See Encounters Tab for problem based charting.  Patient {GC/GE:3044014::"discussed with","seen with"} Dr. {NAMES:[id number]::"Butcher","Granfortuna","E.  Hoffman","Klima","Mullen","Narendra","Raines","Vincent"}  DMII - synjardy (empagliflozin-metformin) 5-1,000mg  BID - Trulicity 0.75mg  [place] weekly (can uptitrate this if needed) - too early for next A1c check - Review log if he brings it - Return in 1-2 months for DM visit  Vision changes - optho visit?  Memory changes - MOCA - Imaging?  HTN - lisinopril 20mg  daily, hctz 25mg  daily, coreg 25mg  bid, tamsulosin 0.4mg  daily

## 2018-06-24 ENCOUNTER — Encounter: Payer: Medicare Other | Admitting: Internal Medicine

## 2018-06-25 ENCOUNTER — Ambulatory Visit: Payer: Medicare Other | Admitting: Podiatry

## 2018-06-26 ENCOUNTER — Telehealth: Payer: Self-pay | Admitting: Dietician

## 2018-06-26 NOTE — Telephone Encounter (Signed)
Patients wife calls and says the Trulicity cost too much -4 pens cost 45$ per month , asks for coupons. She says she does not think his Synjardy costs them as much as the Trulicity. I told her coupons do not usually work with [employer]. I will ask pharmacist to advise when she gets back in the office. Kevin Soto says she has been so busy with her own care she has not been checking Kevin Soto's blood sugar, so she does not know if the Trulicity is helping his blood sugars.

## 2018-06-29 NOTE — Telephone Encounter (Signed)
Thank you for the notification, [name].  I wonder if we can increase in Trulicity to 1.5 mg/week for more efficacy. I scheduled an appointment to help with med cost, will start with Medicare Extra Help and samples and see if we can help get the cost down.

## 2018-07-01 ENCOUNTER — Ambulatory Visit: Payer: Medicare Other | Admitting: Pharmacist

## 2018-07-04 ENCOUNTER — Encounter: Payer: Self-pay | Admitting: Podiatry

## 2018-07-04 ENCOUNTER — Ambulatory Visit: Payer: Medicare Other | Admitting: Podiatry

## 2018-07-04 DIAGNOSIS — B351 Tinea unguium: Secondary | ICD-10-CM

## 2018-07-04 DIAGNOSIS — E119 Type 2 diabetes mellitus without complications: Secondary | ICD-10-CM

## 2018-07-04 DIAGNOSIS — D689 Coagulation defect, unspecified: Secondary | ICD-10-CM | POA: Diagnosis not present

## 2018-07-04 DIAGNOSIS — M79676 Pain in unspecified toe(s): Secondary | ICD-10-CM

## 2018-07-04 NOTE — Progress Notes (Signed)
Complaint:  Visit Type: Patient returns to my office for continued preventative foot care services. Complaint: Patient states" my nails have grown long and thick and become painful to walk and wear shoes" Patient has been diagnosed with DM with no foot complications. The patient presents for preventative foot care services. No changes to ROS.  Patient is taking plavix.  Podiatric Exam: Vascular: dorsalis pedis and posterior tibial pulses are palpable bilateral. Capillary return is immediate. Temperature gradient is WNL. Skin turgor WNL  Sensorium: Normal Semmes Weinstein monofilament test. Normal tactile sensation bilaterally. Nail Exam: Pt has thick disfigured discolored nails with subungual debris noted bilateral entire nail hallux through fifth toenails Ulcer Exam: There is no evidence of ulcer or pre-ulcerative changes or infection. Orthopedic Exam: Muscle tone and strength are WNL. No limitations in general ROM. No crepitus or effusions noted. Foot type and digits show no abnormalities. HAV  B/L. Skin: No Porokeratosis. No infection or ulcers  Diagnosis:  Onychomycosis, , Pain in right toe, pain in left toes  Diabetes  Treatment & Plan Procedures and Treatment: Consent by patient was obtained for treatment procedures.   Debridement of mycotic and hypertrophic toenails, 1 through 5 bilateral and clearing of subungual debris. No ulceration, no infection noted.  Return Visit-Office Procedure: Patient instructed to return to the office for a follow up visit 4 months for continued evaluation and treatment.    Gardiner Barefoot DPM

## 2018-07-28 NOTE — Progress Notes (Deleted)
   CC: ***  HPI:   [name] is a 57 y.o.  Please see problem based charting for the history and status of the patient's current and chronic medical conditions.   Past Medical History:  Diagnosis Date  . Anemia   . CAD (coronary artery disease)    cath 8/12:  LM ok, mLAD occluded with trivial collats from right AM and dRCA, mDx (small) 80%, RI 25%, tiny branch of OM 90%, mAM 30-40%, RCA sub-totally occluded, then 80%, EF 40%, inf HK.  He underwent PCI with [name] with placement of a Promus DES to the mRCA x 2  . Chronic kidney disease    stage 3 per chart  . Diabetes mellitus   . Dyslipidemia   . ED (erectile dysfunction)   . Gait instability 02/27/2011  . History of alcohol abuse   . History of stomach ulcers   . History of stroke    evidence of CVA on MRI in past  . Hyperlipidemia   . Hypertension   . Ischemic cardiomyopathy    echo 7/12: EF 40-45%, septal, apical, inf basal HK, mod LVH, mild MR, mild LAE.  EF 55% by echo 2015  . Macular hemorrhage   . Prostate cancer ([place])   . Sleep apnea    no c-pap; wife denies  . Vision, loss, sudden 02/27/2011    Review of Systems:   Pertinent positives mentioned in HPI. Remainder of all ROS negative.  Physical Exam: There were no vitals filed for this visit. *** Physical Exam  Constitutional: Well-developed, well-nourished, and in no distress.  Eyes: Pupils are equal, round, and reactive to light. EOM are normal.  Cardiovascular: Normal rate and regular rhythm. No murmurs, rubs, or gallops. Pulmonary/Chest: Effort normal. Clear to auscultation bilaterally. No wheezes, rales, or rhonchi. Abdominal: Bowel sounds present. Soft, non-distended, non-tender. Ext: No lower extremity edema. Skin: Warm and dry. No rashes or wounds.   Assessment & Plan:   See Encounters Tab for problem based charting.  Patient {GC/GE:3044014::"discussed with","seen with"} Dr. {NAMES:3044014::"Butcher","Granfortuna","E.  Hoffman","Klima","Mullen","Narendra","Raines","Vincent"}  DMII - synjardy (empagliflozin and metformin) 5-1,000mg  BID - Trulicity (dulaglutide) 0.75mg  [place] weekly --> increase to 1.5mg /week? - has an appointment with Dr. 3-5,$[id number]$ scheduled today as well  Vision changes - been to optho?  Memory change - interview and MOCA today  HTN - well-controlled on lisinopril 20mg  daily, hctz 25mg  daily, coreg 25mg  bid, and tamsulosin 0.5mg  daily

## 2018-07-29 ENCOUNTER — Ambulatory Visit: Payer: Medicare Other | Admitting: Pharmacist

## 2018-07-29 ENCOUNTER — Encounter: Payer: Medicare Other | Admitting: Internal Medicine

## 2018-09-09 ENCOUNTER — Telehealth: Payer: Self-pay

## 2018-09-09 DIAGNOSIS — E1149 Type 2 diabetes mellitus with other diabetic neurological complication: Secondary | ICD-10-CM

## 2018-09-09 NOTE — Telephone Encounter (Signed)
Followed up with patient and spoke to his wife. We applied for Medicare Extra Help.  Patient is currently only taking Synjardy, they are not checking home BG. I advised patient's wife they should start checking patient's home BG to assess efficacy of Synjardy. She states they are unable to afford the Trulicity, advised them to hold Trulicity for now until we can identify successful access as well establish BG monitoring. Clarified glipizide was stopped in December.  Advised patient's wife to contact me if any further medication-related concerns. She verbalized understanding by repeating back information.

## 2018-09-09 NOTE — Telephone Encounter (Signed)
rtc to pt, spoke w/ wife, caregiver. She states pt is taking glipizide but not taking trulicity. He does not check his blood sugars often, she states he is doing well but suggested that donnap and dr Maudie Mercury would be getting back in touch, ate 1 hr ago, asked to wait appr 45 min and check cbg and let them know when they call back. She was agreeable

## 2018-09-09 NOTE — Telephone Encounter (Signed)
Requesting to speak with a nurse about meds. Please call pt back.  

## 2018-09-10 ENCOUNTER — Other Ambulatory Visit: Payer: Self-pay | Admitting: Dietician

## 2018-09-10 DIAGNOSIS — E1149 Type 2 diabetes mellitus with other diabetic neurological complication: Secondary | ICD-10-CM

## 2018-09-10 MED ORDER — ACCU-CHEK FASTCLIX LANCETS MISC: 4 refills | Status: DC

## 2018-09-10 MED ORDER — GLUCOSE BLOOD VI STRP: ORAL_STRIP | 4 refills | Status: DC

## 2018-09-10 NOTE — Telephone Encounter (Signed)
Returning Aleta(Ahmeer's wife) call. She requested assistance with reviewing self monitoring technique. She'll call back later this am when she is at home. Needs updated rx for test strips.

## 2018-09-10 NOTE — Addendum Note (Signed)
Addended by: Resa Miner on: 09/10/2018 09:28 AM   Modules accepted: Orders

## 2018-09-10 NOTE — Telephone Encounter (Signed)
Assited Mrs. Kevin Soto in reviewing how to use a glucometer. Unfortunately, the battery in the meter they have is dead. Advised her how to replace it and where to get one. She requested new prescription for supplies as hers are outdated.

## 2018-09-10 NOTE — Telephone Encounter (Signed)
Aleta called back. Assisted with steps to checking blood sugar for [place]. referred her to [employer] for audio visual learning/reveiw if needed.

## 2018-09-16 ENCOUNTER — Telehealth: Payer: Self-pay | Admitting: Internal Medicine

## 2018-09-16 ENCOUNTER — Ambulatory Visit: Payer: Medicare Other

## 2018-09-16 NOTE — Telephone Encounter (Signed)
Pt's eyes itchy, red and running x 5 days and wants to know if their is anything he can take over counter that is not costly.  Pt requesting a call back.

## 2018-09-16 NOTE — Telephone Encounter (Signed)
It sounds like allergic conjunctivitis. Please have him do a televisit

## 2018-09-16 NOTE — Telephone Encounter (Signed)
telehealth appt 1015 4/29

## 2018-09-16 NOTE — Telephone Encounter (Signed)
telehealth visit? Sending to attending, pcp, dr's helberg and winfrey

## 2018-09-17 ENCOUNTER — Ambulatory Visit (INDEPENDENT_AMBULATORY_CARE_PROVIDER_SITE_OTHER): Payer: Medicare Other | Admitting: Internal Medicine

## 2018-09-17 ENCOUNTER — Other Ambulatory Visit: Payer: Self-pay

## 2018-09-17 DIAGNOSIS — H5789 Other specified disorders of eye and adnexa: Secondary | ICD-10-CM | POA: Diagnosis not present

## 2018-09-17 DIAGNOSIS — H5462 Unqualified visual loss, left eye, normal vision right eye: Secondary | ICD-10-CM | POA: Diagnosis not present

## 2018-09-17 DIAGNOSIS — I69398 Other sequelae of cerebral infarction: Secondary | ICD-10-CM | POA: Diagnosis not present

## 2018-09-17 NOTE — Assessment & Plan Note (Signed)
HPI:  Patient called in with six days of unilateral red eye. It is isolated to the right side. He has had increased lacremation. He has noticed a scratchy feeling whenever he blinks his eye. He has some pain and blurry vision. He denies trauma or matting of the eye. Denies infectious symptoms. Of note he only has vision out of his right eye as he lost vision in his left eye after a CVA.  A/P: - Given red eye that is painful and has visual changes the patient needs to be seen by optho. He will call his opthamologist for an urgent appointment.

## 2018-09-17 NOTE — Progress Notes (Signed)
   CC: Red eye  This is a telephone encounter between [employer] and [employer] on 09/17/2018 for red eye. The visit was conducted with the patient located at home and Kevin Soto at [facility]. The patient's identity was confirmed using their DOB and current address. The patient has consented to being evaluated through a telephone encounter and understands the associated risks (an examination cannot be done and the patient may need to come in for an appointment) / benefits (allows the patient to remain at home, decreasing exposure to coronavirus). I personally spent 12 minutes on medical discussion.   HPI:  [name] is a 58 y.o. with PMH as below.   Please see A&P for assessment of the patient's acute and chronic medical conditions.   Past Medical History:  Diagnosis Date  . Anemia   . CAD (coronary artery disease)    cath 8/12:  LM ok, mLAD occluded with trivial collats from right AM and dRCA, mDx (small) 80%, RI 25%, tiny branch of OM 90%, mAM 30-40%, RCA sub-totally occluded, then 80%, EF 40%, inf HK.  He underwent PCI with [name] with placement of a Promus DES to the mRCA x 2  . Chronic kidney disease    stage 3 per chart  . Diabetes mellitus   . Dyslipidemia   . ED (erectile dysfunction)   . Gait instability 02/27/2011  . History of alcohol abuse   . History of stomach ulcers   . History of stroke    evidence of CVA on MRI in past  . Hyperlipidemia   . Hypertension   . Ischemic cardiomyopathy    echo 7/12: EF 40-45%, septal, apical, inf basal HK, mod LVH, mild MR, mild LAE.  EF 55% by echo 2015  . Macular hemorrhage   . Prostate cancer ([place])   . Sleep apnea    no c-pap; wife denies  . Vision, loss, sudden 02/27/2011   Review of Systems:  Performed and all others negative.  Assessment & Plan:   See Encounters Tab for problem based charting.  Patient discussed with Dr. Dareen Piano

## 2018-09-17 NOTE — Progress Notes (Signed)
Internal Medicine Clinic Attending  Case discussed with Dr. Helberg at the time of the visit.  We reviewed the resident's history and exam and pertinent patient test results.  I agree with the assessment, diagnosis, and plan of care documented in the resident's note.    

## 2018-09-21 NOTE — Progress Notes (Signed)
   This is a telephone encounter between [employer] (the patient's wife) and me for a healthcare maintenance visit. The visit was conducted with the patient's wife located at home and me at [facility]. The wife's identity was confirmed using the patient's DOB and current address. The patient has consented to being evaluated through a telephone encounter and understands the associated risks/benefits. I personally spent 12 minutes on medical discussion.   HPI:   [name] is a 58 y.o. male with the medical conditions below. His wife manages his medical conditions. I spoke with his wife about his memory changes, DMII, HTN, and eye changes.    Past Medical History:  Diagnosis Date  . Anemia   . CAD (coronary artery disease)    cath 8/12:  LM ok, mLAD occluded with trivial collats from right AM and dRCA, mDx (small) 80%, RI 25%, tiny branch of OM 90%, mAM 30-40%, RCA sub-totally occluded, then 80%, EF 40%, inf HK.  He underwent PCI with [name] with placement of a Promus DES to the mRCA x 2  . Chronic kidney disease    stage 3 per chart  . Diabetes mellitus   . Dyslipidemia   . ED (erectile dysfunction)   . Gait instability 02/27/2011  . History of alcohol abuse   . History of stomach ulcers   . History of stroke    evidence of CVA on MRI in past  . Hyperlipidemia   . Hypertension   . Ischemic cardiomyopathy    echo 7/12: EF 40-45%, septal, apical, inf basal HK, mod LVH, mild MR, mild LAE.  EF 55% by echo 2015  . Macular hemorrhage   . Prostate cancer ([place])   . Sleep apnea    no c-pap; wife denies  . Vision, loss, sudden 02/27/2011    Review of Systems:   Pertinent positives mentioned in HPI. Remainder of all ROS negative.   Assessment & Plan:   Patient discussed with Dr. Dareen Piano

## 2018-09-23 ENCOUNTER — Other Ambulatory Visit: Payer: Self-pay

## 2018-09-23 ENCOUNTER — Ambulatory Visit: Payer: Medicare Other | Admitting: Pharmacist

## 2018-09-23 ENCOUNTER — Ambulatory Visit (INDEPENDENT_AMBULATORY_CARE_PROVIDER_SITE_OTHER): Payer: Medicare Other | Admitting: Internal Medicine

## 2018-09-23 DIAGNOSIS — Z7984 Long term (current) use of oral hypoglycemic drugs: Secondary | ICD-10-CM

## 2018-09-23 DIAGNOSIS — E1149 Type 2 diabetes mellitus with other diabetic neurological complication: Secondary | ICD-10-CM | POA: Diagnosis not present

## 2018-09-23 DIAGNOSIS — H5789 Other specified disorders of eye and adnexa: Secondary | ICD-10-CM

## 2018-09-23 DIAGNOSIS — Z79899 Other long term (current) drug therapy: Secondary | ICD-10-CM

## 2018-09-23 DIAGNOSIS — E1159 Type 2 diabetes mellitus with other circulatory complications: Secondary | ICD-10-CM | POA: Diagnosis not present

## 2018-09-23 DIAGNOSIS — R413 Other amnesia: Secondary | ICD-10-CM | POA: Diagnosis not present

## 2018-09-23 DIAGNOSIS — I10 Essential (primary) hypertension: Secondary | ICD-10-CM | POA: Diagnosis not present

## 2018-09-23 MED ORDER — EMPAGLIFLOZIN-METFORMIN HCL 5-1000 MG PO TABS: 1.0000 | ORAL_TABLET | Freq: Two times a day (BID) | ORAL | 2 refills | Status: DC

## 2018-09-24 ENCOUNTER — Telehealth: Payer: Self-pay | Admitting: Internal Medicine

## 2018-09-24 NOTE — Telephone Encounter (Signed)
Pls contact Gloris Ham [phone]

## 2018-09-24 NOTE — Assessment & Plan Note (Signed)
Last A1c was 10.3 four months ago. Patient is currently taking Synjardy. He has been prescribed Trulicity as well, but he has been unable to afford this medication. Dr. Maudie Mercury has applied for financial assistance for him, and he should be notified of the results of his application in two weeks. He does not check his blood sugars at home, but his wife recently obtained test strips and a glucometer. She plans to start checking his blood sugars 1-2 times per day.   Assessment: Can defer repeat A1c testing until patient is started on trulicity. Encouraged patient's wife to check his blood sugars at least once a day in the morning, keep a log of these values, and bring the log with her to his next visit.  Plan - Continue Synjardy 5-1,000mg  BID - Start Trulicity when able to afford  - Clinic visit in 3 months for repeat A1c

## 2018-09-24 NOTE — Assessment & Plan Note (Signed)
Patient's wife reports that she has noticed poor short term memory in Kevin Soto for over a year. One example is that Kevin Soto frequently forgets where his wife has gone even though she told him where she was going. He also forgot that they were going to his mother-in-law's birthday party even though he was already dressed and holding her birthday card. She states he has a wonderful memory when it comes to sports and music. He has never forgotten something that has made her concerned for his or her own safety. These memory changes are stable and have not worsened. She reports that his mood is good and he is very pleasant.   Assessment: These may represent early changes of dementia, however his memory issues appear mild based on the wife's account. No urgent indication for imaging.  Plan - MOCA at next visit

## 2018-09-24 NOTE — Telephone Encounter (Signed)
Kevin Soto is calling back [phone]

## 2018-09-24 NOTE — Assessment & Plan Note (Signed)
Currently takes lisinopril, hctz, coreg, and tamsulosin. Wife reports good compliance. He does not take his blood pressures at home. No chest pain or dyspnea.   Plan - Continue lisinopril 20mg  daily, hctz 25mg  daily, coreg 25mg  bid, and tamsulosin 0.5mg  daily

## 2018-09-24 NOTE — Progress Notes (Signed)
Internal Medicine Clinic Attending  Case discussed with Dr. Dorrell at the time of the visit.  We reviewed the resident's history and exam and pertinent patient test results.  I agree with the assessment, diagnosis, and plan of care documented in the resident's note.    

## 2018-09-24 NOTE — Assessment & Plan Note (Signed)
Patient called his ophthalmologist as recommended. He was prescribed eye drops and his symptoms have resolved.

## 2018-09-25 NOTE — Telephone Encounter (Signed)
Return call - no answer; left message to call us back .

## 2018-09-26 ENCOUNTER — Telehealth: Payer: Self-pay | Admitting: Dietician

## 2018-09-26 NOTE — Telephone Encounter (Signed)
Assisted Mrs Fruin with troubleshooting Mr. Fana glucometer. She had gotten the wrong batteries. She is picking up a new meter here today and will call when she gets here.

## 2018-09-26 NOTE — Telephone Encounter (Signed)
Kevin Soto called for assistance with using his glucometer     She successfully checked his blood sugar while we are on the phone with a result of 170 after lunch of salmon, salad and brown rice. Encouraged her to call the toll free number over the weekend if she needs help or me on Monday.

## 2018-10-01 ENCOUNTER — Telehealth: Payer: Self-pay | Admitting: Dietician

## 2018-10-01 NOTE — Telephone Encounter (Signed)
  Mrs. Holtsclaw calls for diabetes self management support:  She wants to share/help interpreting blood sugar results. She is checking Mr. Washabaugh blood sugars before and after eating some meals and recording food intake. Also, wants to share that synjardy cost has decreased. He is not taking Trulicity right now due to cost.  Before breakfast readings-174/174/164 After eating readings- 178/172/257/193 Examples of meals-  Raisin bran- medium amount, whole milk- 257 Meatloaf, brown rice, vegetables 172 Scr egg, spinach, mozzarella, toast- 193   She was content with his after meal results, (with the exception of the raisin bran meal which she intends to discontinue), but is concerned about his fasting values. She reports his activity level is very low(sits in a chair all day). We discussed encouraging small walks after meals, activity while watching TV( exercise programs)  or finding a walking partner as options to encourage more activity. She is hoping trulicity may also help. Note sent to pharmacy per her request about getting Trulicity. Will mail her education on how to use/interpret blood glucose numbers. Follow up as needed.  Debera Lat, RD 10/01/2018 4:44 PM. .

## 2018-10-02 ENCOUNTER — Encounter: Payer: Self-pay | Admitting: Dietician

## 2018-10-02 DIAGNOSIS — R3915 Urgency of urination: Secondary | ICD-10-CM | POA: Diagnosis not present

## 2018-10-02 NOTE — Telephone Encounter (Signed)
Pharmacy ran Trulicity with same copay card and it was $8.95. Kevin Soto was informed. She was not aware that it was an injection. Told her to call the office for assistance if needed after she picks it up and reads the directions inside. Will also send her information about utube video with instructions by a pharmacist.

## 2018-10-09 ENCOUNTER — Telehealth: Payer: Self-pay | Admitting: Pharmacist

## 2018-10-15 NOTE — Progress Notes (Signed)
Unable to reach.

## 2018-10-29 ENCOUNTER — Other Ambulatory Visit: Payer: Self-pay | Admitting: Internal Medicine

## 2018-11-04 ENCOUNTER — Ambulatory Visit: Payer: Medicare Other | Admitting: Podiatry

## 2018-11-09 ENCOUNTER — Encounter: Payer: Self-pay | Admitting: *Deleted

## 2018-11-10 ENCOUNTER — Other Ambulatory Visit: Payer: Self-pay | Admitting: Internal Medicine

## 2018-11-10 DIAGNOSIS — E1149 Type 2 diabetes mellitus with other diabetic neurological complication: Secondary | ICD-10-CM

## 2018-11-17 ENCOUNTER — Telehealth: Payer: Self-pay | Admitting: Dietician

## 2018-11-19 NOTE — Telephone Encounter (Signed)
Tried calling patient and his two listed emergency contacts and was unable to get though to leave a message.will try to call again later. If unable to get through will mail a letter asking the to call the office

## 2018-11-25 ENCOUNTER — Encounter: Payer: Self-pay | Admitting: Dietician

## 2018-11-25 NOTE — Telephone Encounter (Signed)
Am still unable to get through on any phone number listed for Mr. Schimming or his alternative contacts. Will mail letter asking him to call the office.

## 2019-01-07 ENCOUNTER — Other Ambulatory Visit: Payer: Self-pay

## 2019-01-07 NOTE — Patient Outreach (Signed)
[place] [facility]) Care Management  01/07/2019  TORYN MCCLINTON [birth year] [id number]   Medication Adherence call to Mr. Lyndal Reggio  Patients telephone number is disconnected patient is past due under [place].   [place] Management Direct Dial [phone]  Fax [phone] [email or web address]

## 2019-01-21 ENCOUNTER — Other Ambulatory Visit: Payer: Self-pay

## 2019-01-21 NOTE — Patient Outreach (Signed)
Kevin Soto) Care Management  01/21/2019  Kevin Soto 09-09-1960 [id number]   Medication Adherence call to [name]  Patients telephone number is disconnected patient is past due under [place].   [place] Management Direct Dial [phone]  Fax [phone] [email or web address]

## 2019-02-10 ENCOUNTER — Other Ambulatory Visit: Payer: Self-pay | Admitting: *Deleted

## 2019-02-10 DIAGNOSIS — E1159 Type 2 diabetes mellitus with other circulatory complications: Secondary | ICD-10-CM

## 2019-02-10 MED ORDER — LISINOPRIL 20 MG PO TABS: 20.0000 mg | ORAL_TABLET | Freq: Every day | ORAL | 3 refills | Status: DC

## 2019-02-16 ENCOUNTER — Other Ambulatory Visit: Payer: Self-pay

## 2019-02-16 DIAGNOSIS — E1149 Type 2 diabetes mellitus with other diabetic neurological complication: Secondary | ICD-10-CM

## 2019-02-16 NOTE — Telephone Encounter (Signed)
TRULICITY 4.83 WX/4.6MK SOPN   REFILL REQUEST @ [facility] ON [address]

## 2019-02-17 MED ORDER — TRULICITY 0.75 MG/0.5ML ~~LOC~~ SOAJ: 0.5000 mL | SUBCUTANEOUS | 1 refills | Status: DC

## 2019-02-17 NOTE — Addendum Note (Signed)
Addended by: Velora Heckler on: 02/17/2019 12:08 PM   Modules accepted: Orders

## 2019-02-25 ENCOUNTER — Other Ambulatory Visit: Payer: Self-pay

## 2019-02-25 NOTE — Patient Outreach (Signed)
[place] [employer] [facility]) Care Management  02/25/2019  NYGEL PROKOP [birth year] [id number]  Medication Adherence call to Mr. Corbet Hanley patients telephone number is disconnected patient is past due on Atorvastatin 40 mg under [place].   [place] Management Direct Dial [phone]  Fax [phone] [email or web address]

## 2019-03-24 ENCOUNTER — Encounter: Payer: Medicare Other | Admitting: Internal Medicine

## 2019-03-24 ENCOUNTER — Encounter: Payer: Self-pay | Admitting: Internal Medicine

## 2019-03-29 ENCOUNTER — Other Ambulatory Visit: Payer: Self-pay | Admitting: Pharmacist

## 2019-03-29 DIAGNOSIS — E1159 Type 2 diabetes mellitus with other circulatory complications: Secondary | ICD-10-CM

## 2019-03-29 DIAGNOSIS — E1149 Type 2 diabetes mellitus with other diabetic neurological complication: Secondary | ICD-10-CM

## 2019-03-29 DIAGNOSIS — I6310 Cerebral infarction due to embolism of unspecified precerebral artery: Secondary | ICD-10-CM

## 2019-03-29 DIAGNOSIS — I152 Hypertension secondary to endocrine disorders: Secondary | ICD-10-CM

## 2019-03-29 MED ORDER — CLOPIDOGREL BISULFATE 75 MG PO TABS: 75.0000 mg | ORAL_TABLET | Freq: Every day | ORAL | 1 refills | Status: DC

## 2019-03-29 MED ORDER — HYDROCHLOROTHIAZIDE 25 MG PO TABS: 25.0000 mg | ORAL_TABLET | Freq: Every day | ORAL | 3 refills | Status: DC

## 2019-03-29 MED ORDER — ATORVASTATIN CALCIUM 40 MG PO TABS: 40.0000 mg | ORAL_TABLET | Freq: Every day | ORAL | 3 refills | Status: DC

## 2019-03-29 MED ORDER — SYNJARDY 5-1000 MG PO TABS: 1.0000 | ORAL_TABLET | Freq: Two times a day (BID) | ORAL | 2 refills | Status: DC

## 2019-03-29 MED ORDER — CARVEDILOL 25 MG PO TABS: ORAL_TABLET | ORAL | 1 refills | Status: DC

## 2019-07-10 ENCOUNTER — Other Ambulatory Visit: Payer: Self-pay | Admitting: Internal Medicine

## 2019-07-10 DIAGNOSIS — E1149 Type 2 diabetes mellitus with other diabetic neurological complication: Secondary | ICD-10-CM

## 2019-07-10 NOTE — Telephone Encounter (Signed)
Called patient. No Answer. If no response will mail patient a letter.

## 2019-07-13 ENCOUNTER — Encounter: Payer: Self-pay | Admitting: Internal Medicine

## 2019-07-13 NOTE — Telephone Encounter (Signed)
Called patient N/a.  Unable to sch an appointment.  A letter has been mailed to the patient to call back and schedule an appointment as soon as possible.

## 2019-08-03 ENCOUNTER — Other Ambulatory Visit: Payer: Self-pay | Admitting: *Deleted

## 2019-08-03 DIAGNOSIS — I152 Hypertension secondary to endocrine disorders: Secondary | ICD-10-CM

## 2019-08-03 DIAGNOSIS — E1149 Type 2 diabetes mellitus with other diabetic neurological complication: Secondary | ICD-10-CM

## 2019-08-03 DIAGNOSIS — I6310 Cerebral infarction due to embolism of unspecified precerebral artery: Secondary | ICD-10-CM

## 2019-08-03 DIAGNOSIS — E1159 Type 2 diabetes mellitus with other circulatory complications: Secondary | ICD-10-CM

## 2019-08-03 NOTE — Telephone Encounter (Signed)
Next appt scheduled tomorrow 3/16 with PCP.

## 2019-08-04 ENCOUNTER — Telehealth: Payer: Self-pay | Admitting: *Deleted

## 2019-08-04 ENCOUNTER — Encounter: Payer: Medicare Other | Admitting: Internal Medicine

## 2019-08-04 MED ORDER — SYNJARDY 5-1000 MG PO TABS: 1.0000 | ORAL_TABLET | Freq: Two times a day (BID) | ORAL | 2 refills | Status: DC

## 2019-08-04 MED ORDER — CARVEDILOL 25 MG PO TABS: ORAL_TABLET | ORAL | 1 refills | Status: DC

## 2019-08-04 MED ORDER — CLOPIDOGREL BISULFATE 75 MG PO TABS: 75.0000 mg | ORAL_TABLET | Freq: Every day | ORAL | 1 refills | Status: DC

## 2019-08-04 MED ORDER — TAMSULOSIN HCL 0.4 MG PO CAPS: 0.4000 mg | ORAL_CAPSULE | Freq: Every day | ORAL | 0 refills | Status: DC

## 2019-08-04 NOTE — Telephone Encounter (Signed)
Pt did not come to his appt today. Called pt to re-schedule - no answer, "call can not be completed", unable to leave a message.

## 2019-10-26 ENCOUNTER — Encounter: Payer: Self-pay | Admitting: Internal Medicine

## 2019-10-26 ENCOUNTER — Ambulatory Visit (INDEPENDENT_AMBULATORY_CARE_PROVIDER_SITE_OTHER): Payer: Medicare Other | Admitting: Internal Medicine

## 2019-10-26 VITALS — BP 122/92 | HR 88 | Temp 98.2°F | Ht 72.0 in | Wt 225.1 lb

## 2019-10-26 DIAGNOSIS — E785 Hyperlipidemia, unspecified: Secondary | ICD-10-CM | POA: Diagnosis not present

## 2019-10-26 DIAGNOSIS — N183 Chronic kidney disease, stage 3 unspecified: Secondary | ICD-10-CM | POA: Diagnosis not present

## 2019-10-26 DIAGNOSIS — E1159 Type 2 diabetes mellitus with other circulatory complications: Secondary | ICD-10-CM | POA: Diagnosis not present

## 2019-10-26 DIAGNOSIS — E1169 Type 2 diabetes mellitus with other specified complication: Secondary | ICD-10-CM | POA: Diagnosis not present

## 2019-10-26 DIAGNOSIS — Z7984 Long term (current) use of oral hypoglycemic drugs: Secondary | ICD-10-CM

## 2019-10-26 DIAGNOSIS — E1149 Type 2 diabetes mellitus with other diabetic neurological complication: Secondary | ICD-10-CM

## 2019-10-26 DIAGNOSIS — E1122 Type 2 diabetes mellitus with diabetic chronic kidney disease: Secondary | ICD-10-CM

## 2019-10-26 DIAGNOSIS — I129 Hypertensive chronic kidney disease with stage 1 through stage 4 chronic kidney disease, or unspecified chronic kidney disease: Secondary | ICD-10-CM

## 2019-10-26 DIAGNOSIS — Z0000 Encounter for general adult medical examination without abnormal findings: Secondary | ICD-10-CM

## 2019-10-26 DIAGNOSIS — I6310 Cerebral infarction due to embolism of unspecified precerebral artery: Secondary | ICD-10-CM

## 2019-10-26 DIAGNOSIS — I2510 Atherosclerotic heart disease of native coronary artery without angina pectoris: Secondary | ICD-10-CM

## 2019-10-26 DIAGNOSIS — I152 Hypertension secondary to endocrine disorders: Secondary | ICD-10-CM

## 2019-10-26 LAB — POCT GLYCOSYLATED HEMOGLOBIN (HGB A1C): Hemoglobin A1C: 7.8 % — AB (ref 4.0–5.6)

## 2019-10-26 LAB — GLUCOSE, CAPILLARY: Glucose-Capillary: 137 mg/dL — ABNORMAL HIGH (ref 70–99)

## 2019-10-26 MED ORDER — TRULICITY 0.75 MG/0.5ML ~~LOC~~ SOAJ: 1.5000 mg | SUBCUTANEOUS | 3 refills | Status: DC

## 2019-10-26 MED ORDER — CLOPIDOGREL BISULFATE 75 MG PO TABS: 75.0000 mg | ORAL_TABLET | Freq: Every day | ORAL | 1 refills | Status: DC

## 2019-10-26 NOTE — Progress Notes (Signed)
   CC: diabetes f/u  HPI:  Kevin Soto is a 59 y.o. male with PMHx of diabetes mellitus, CKD3, hypertension and hyperlipidemia presenting for follow up of his diabetes and hypertension. No acute concerns at this time. He is accompanied by his sister. Please see problem based charting for full assessment and plan.  Past Medical History:  Diagnosis Date  . Anemia   . CAD (coronary artery disease)    cath 8/12:  LM ok, mLAD occluded with trivial collats from right AM and dRCA, mDx (small) 80%, RI 25%, tiny branch of OM 90%, mAM 30-40%, RCA sub-totally occluded, then 80%, EF 40%, inf HK.  He underwent PCI with [name] with placement of a Promus DES to the mRCA x 2  . Chronic kidney disease    stage 3 per chart  . Diabetes mellitus   . Dyslipidemia   . ED (erectile dysfunction)   . Gait instability 02/27/2011  . History of alcohol abuse   . History of stomach ulcers   . History of stroke    evidence of CVA on MRI in past  . Hyperlipidemia   . Hypertension   . Ischemic cardiomyopathy    echo 7/12: EF 40-45%, septal, apical, inf basal HK, mod LVH, mild MR, mild LAE.  EF 55% by echo 2015  . Macular hemorrhage   . Prostate cancer ([place])   . Sleep apnea    no c-pap; wife denies  . Vision, loss, sudden 02/27/2011   Review of Systems:  Negative except as stated in HPI.   Physical Exam:  Vitals:   10/26/19 1539  BP: (!) 122/92  Pulse: 88  Temp: 98.2 F (36.8 C)  TempSrc: Oral  SpO2: 100%  Weight: 225 lb 1.6 oz (102.1 kg)  Height: 6' (1.829 m)   Physical Exam  Constitutional: Appears well-developed and well-nourished. No distress.  Head: Normocephalic and atraumatic.  Eyes: Conjunctivae are normal.  Cardiovascular: Normal rate, regular rhythm and normal heart sounds.  Respiratory: Effort normal and breath sounds normal. No respiratory distress. No wheezes.  GI: Soft. Bowel sounds are normal. No distension. There is no tenderness.  Musculoskeletal: No edema.    Neurological: Alert and orientedx4, no apparent focal deficits noted.  Skin: Not diaphoretic. No erythema or rash appreciated.    Assessment & Plan:   See Encounters Tab for problem based charting.  Patient discussed with Dr. Evette Doffing

## 2019-10-26 NOTE — Patient Instructions (Addendum)
Kevin Soto,  It was a pleasure seeing you in clinic. Today we discussed:   Hypertension: Please continue to take your medications as prescribed  Diabetes: Your A1c today is 7.8, Great job! Please continue to take your Synjardy twice daily. At this time, I will send a prescription to increase the dose for your Trulicity. Please continue to take this weekly. Please schedule a visit with [name]. You will also need a follow up visit with the retina specialist.  Please f/u in 3 months for repeat A1c.   If you have any questions or concerns, please call our clinic at [phone] between 9am-5pm and after hours call [phone] and ask for the internal medicine resident on call. If you feel you are having a medical emergency please call 911.   Thank you, we look forward to helping you remain healthy!   If you have not already done so, please get your COVID 19 vaccine. To schedule an appointment for a COVID vaccine or be added to the vaccine wait list: Go to [email or web address]   OR Go to [email or web address]                  OR Call [phone]                                     OR Call [phone] and select Option 2

## 2019-10-27 LAB — LIPID PANEL
Chol/HDL Ratio: 4 ratio (ref 0.0–5.0)
Cholesterol, Total: 187 mg/dL (ref 100–199)
HDL: 47 mg/dL (ref >39)
LDL Chol Calc (NIH): 121 mg/dL — ABNORMAL HIGH (ref 0–99)
Triglycerides: 106 mg/dL (ref 0–149)
VLDL Cholesterol Cal: 19 mg/dL (ref 5–40)

## 2019-10-27 LAB — BMP8+ANION GAP
Anion Gap: 18 mmol/L (ref 10.0–18.0)
BUN/Creatinine Ratio: 21 (ref 10–24)
BUN: 38 mg/dL — ABNORMAL HIGH (ref 8–27)
CO2: 21 mmol/L (ref 20–29)
Calcium: 10.3 mg/dL — ABNORMAL HIGH (ref 8.6–10.2)
Chloride: 101 mmol/L (ref 96–106)
Creatinine, Ser: 1.82 mg/dL — ABNORMAL HIGH (ref 0.76–1.27)
GFR calc Af Amer: 45 mL/min/{1.73_m2} — ABNORMAL LOW (ref >59)
GFR calc non Af Amer: 39 mL/min/{1.73_m2} — ABNORMAL LOW (ref >59)
Glucose: 148 mg/dL — ABNORMAL HIGH (ref 65–99)
Potassium: 4.9 mmol/L (ref 3.5–5.2)
Sodium: 140 mmol/L (ref 134–144)

## 2019-10-29 NOTE — Assessment & Plan Note (Signed)
Patient with Hx of CAD w/ DES to mRCAx2 in 2012. This is stable. Patient is on aspirin and plavix without any evidence of bleeding.  Plan: Continue aspirin 81mg  daily and plavix 75mg  daily Continue Coreg 25mg  daily Continue Lisinopril 20mg  daily + HCTZ 25mg  daily

## 2019-10-29 NOTE — Assessment & Plan Note (Signed)
HbA1c 7.8 at this visit.Patient is on Synjardy 5-1000mg  twice daily and Trulicity 0.75mg  weekly. He notes that he does not check his blood sugars at home. Patient notes compliance with medication and diet. Patient would benefit from increasing trulicity to 1.5mg  weekly to obtain Hb<7.   Plan: Continue Synjardy twice daily Increase trulicity to 1.5mg  weekly HbA1c in 3 months

## 2019-10-29 NOTE — Assessment & Plan Note (Signed)
Patient to schedule visit with retina eye specialist for diabetic eye exam.

## 2019-10-29 NOTE — Assessment & Plan Note (Signed)
Patient on lisinopril, HCTZ, coreg and tamsulosin. He notes good compliance with medications. Does not check BP at home but BP is normotensive during this visit. Asymptomatic.   Plan: - Continue lisinopril 20mg  daily, HCTZ 25mg  daily, Coreg 25mg  bid, and tamsulosin 0.5mg  daily - Consider combination pill to decrease pill burden at next visit

## 2019-10-29 NOTE — Assessment & Plan Note (Signed)
Patient with history of CKD3. BMP obtained at this visit with slightly worsening renal function. May be progression of CKD3 vs AKI in setting of lisinopril and HCTZ use. Will repeat BMP and UA at next visit to monitor.

## 2019-10-30 NOTE — Progress Notes (Signed)
Internal Medicine Clinic Attending  Case discussed with Dr. Aslam at the time of the visit.  We reviewed the resident's history and exam and pertinent patient test results.  I agree with the assessment, diagnosis, and plan of care documented in the resident's note.  

## 2019-11-03 ENCOUNTER — Telehealth: Payer: Self-pay | Admitting: *Deleted

## 2019-11-03 MED ORDER — TRULICITY 1.5 MG/0.5ML ~~LOC~~ SOAJ
1.5000 mg | SUBCUTANEOUS | 0 refills | Status: DC
Start: 2019-11-03 — End: 2019-12-03

## 2019-11-03 NOTE — Telephone Encounter (Signed)
Appropriate Rx change made. Thank you

## 2019-11-03 NOTE — Telephone Encounter (Signed)
Received fax from [facility] stating:   Trulicity pen 0.75mg /0.11mL can only deliver 0.75 mg (0.5 mL). If dose increase is warranted, please consider switching to Trulicity Pen 1.5 KF/2.5 mL. Thank you.

## 2019-12-03 ENCOUNTER — Other Ambulatory Visit: Payer: Self-pay | Admitting: Internal Medicine

## 2019-12-09 ENCOUNTER — Telehealth: Payer: Self-pay

## 2019-12-09 NOTE — Telephone Encounter (Signed)
Spoke with patient today, notified him Sharilyn Sites paper is ready for pick up.

## 2019-12-15 ENCOUNTER — Other Ambulatory Visit: Payer: Self-pay

## 2019-12-15 ENCOUNTER — Ambulatory Visit (INDEPENDENT_AMBULATORY_CARE_PROVIDER_SITE_OTHER): Payer: Medicare Other | Admitting: Podiatry

## 2019-12-15 ENCOUNTER — Encounter: Payer: Self-pay | Admitting: Podiatry

## 2019-12-15 DIAGNOSIS — M79676 Pain in unspecified toe(s): Secondary | ICD-10-CM | POA: Diagnosis not present

## 2019-12-15 DIAGNOSIS — E113591 Type 2 diabetes mellitus with proliferative diabetic retinopathy without macular edema, right eye: Secondary | ICD-10-CM | POA: Diagnosis not present

## 2019-12-15 DIAGNOSIS — H40051 Ocular hypertension, right eye: Secondary | ICD-10-CM | POA: Diagnosis not present

## 2019-12-15 DIAGNOSIS — E119 Type 2 diabetes mellitus without complications: Secondary | ICD-10-CM | POA: Diagnosis not present

## 2019-12-15 DIAGNOSIS — D689 Coagulation defect, unspecified: Secondary | ICD-10-CM

## 2019-12-15 DIAGNOSIS — B351 Tinea unguium: Secondary | ICD-10-CM

## 2019-12-15 DIAGNOSIS — T85398D Other mechanical complication of other ocular prosthetic devices, implants and grafts, subsequent encounter: Secondary | ICD-10-CM | POA: Diagnosis not present

## 2019-12-15 DIAGNOSIS — H2522 Age-related cataract, morgagnian type, left eye: Secondary | ICD-10-CM | POA: Diagnosis not present

## 2019-12-15 NOTE — Progress Notes (Signed)
This patient returns to my office for at risk foot care.  This patient requires this care by a professional since this patient will be at risk due to having CKD stage 3 due to type 2 diabetes  CVA, and coagulation defect.  Patient is taking plavix.Marland Kitchen  He presents to the office with male caregiver since he has lost his wife within the last year.    This patient is unable to cut nails himself since the patient cannot reach his nails.These nails are painful walking and wearing shoes.  This patient presents for at risk foot care today.  General Appearance  Alert, conversant and in no acute stress.  Vascular  Dorsalis pedis and posterior tibial  pulses are palpable  bilaterally.  Capillary return is within normal limits  bilaterally. Temperature is within normal limits  bilaterally.  Neurologic  Deferred.  Nails Thick disfigured discolored nails with subungual debris  from hallux to fifth toes bilaterally. No evidence of bacterial infection or drainage bilaterally.  Orthopedic  No limitations of motion  feet .  No crepitus or effusions noted.  No bony pathology or digital deformities noted. HAV  B/L.  Skin  normotropic skin with no porokeratosis noted bilaterally.  No signs of infections or ulcers noted.     Onychomycosis  Pain in right toes  Pain in left toes  Consent was obtained for treatment procedures.   Mechanical debridement of nails 1-5  bilaterally performed with a nail nipper.  Filed with dremel without incident.    Return office visit    3 months                  Told patient to return for periodic foot care and evaluation due to potential at risk complications.   Gardiner Barefoot DPM

## 2020-02-23 ENCOUNTER — Other Ambulatory Visit: Payer: Self-pay | Admitting: Internal Medicine

## 2020-02-23 DIAGNOSIS — E1159 Type 2 diabetes mellitus with other circulatory complications: Secondary | ICD-10-CM

## 2020-02-23 DIAGNOSIS — I6310 Cerebral infarction due to embolism of unspecified precerebral artery: Secondary | ICD-10-CM

## 2020-02-23 DIAGNOSIS — E1149 Type 2 diabetes mellitus with other diabetic neurological complication: Secondary | ICD-10-CM

## 2020-03-16 ENCOUNTER — Ambulatory Visit: Payer: Medicare Other | Admitting: Podiatry

## 2020-04-25 ENCOUNTER — Other Ambulatory Visit: Payer: Self-pay | Admitting: Internal Medicine

## 2020-04-25 DIAGNOSIS — E1149 Type 2 diabetes mellitus with other diabetic neurological complication: Secondary | ICD-10-CM

## 2020-04-25 DIAGNOSIS — I152 Hypertension secondary to endocrine disorders: Secondary | ICD-10-CM

## 2020-05-05 ENCOUNTER — Telehealth: Payer: Self-pay

## 2020-05-05 NOTE — Telephone Encounter (Signed)
Called pt regarding [place] form is ready for pick up. Pt did not answer the phone, vm is full.

## 2020-06-08 ENCOUNTER — Other Ambulatory Visit: Payer: Self-pay

## 2020-06-08 ENCOUNTER — Telehealth: Payer: Self-pay

## 2020-06-08 DIAGNOSIS — I2510 Atherosclerotic heart disease of native coronary artery without angina pectoris: Secondary | ICD-10-CM

## 2020-06-08 DIAGNOSIS — I152 Hypertension secondary to endocrine disorders: Secondary | ICD-10-CM

## 2020-06-08 DIAGNOSIS — I6310 Cerebral infarction due to embolism of unspecified precerebral artery: Secondary | ICD-10-CM

## 2020-06-08 DIAGNOSIS — E1149 Type 2 diabetes mellitus with other diabetic neurological complication: Secondary | ICD-10-CM

## 2020-06-08 MED ORDER — LISINOPRIL 20 MG PO TABS: 20.0000 mg | ORAL_TABLET | Freq: Every day | ORAL | 3 refills | Status: DC

## 2020-06-08 MED ORDER — ATORVASTATIN CALCIUM 40 MG PO TABS
40.0000 mg | ORAL_TABLET | Freq: Every day | ORAL | 1 refills | Status: DC
Start: 2020-06-08 — End: 2020-07-22

## 2020-06-08 MED ORDER — NITROGLYCERIN 0.4 MG SL SUBL: 0.4000 mg | SUBLINGUAL_TABLET | SUBLINGUAL | 1 refills | Status: DC | PRN

## 2020-06-08 MED ORDER — ASPIRIN EC 81 MG PO TBEC: 81.0000 mg | DELAYED_RELEASE_TABLET | Freq: Every day | ORAL | 11 refills | Status: DC

## 2020-06-08 MED ORDER — ACCU-CHEK FASTCLIX LANCETS MISC: 4 refills | Status: DC

## 2020-06-08 MED ORDER — ACCU-CHEK GUIDE VI STRP: ORAL_STRIP | 4 refills | Status: DC

## 2020-06-08 MED ORDER — CLOPIDOGREL BISULFATE 75 MG PO TABS: 75.0000 mg | ORAL_TABLET | Freq: Every day | ORAL | 3 refills | Status: DC

## 2020-06-08 MED ORDER — SYNJARDY 5-1000 MG PO TABS: 1.0000 | ORAL_TABLET | Freq: Two times a day (BID) | ORAL | 3 refills | Status: DC

## 2020-06-08 MED ORDER — CARVEDILOL 25 MG PO TABS
ORAL_TABLET | ORAL | 3 refills | Status: DC
Start: 2020-06-08 — End: 2021-06-13

## 2020-06-08 MED ORDER — TRULICITY 1.5 MG/0.5ML ~~LOC~~ SOAJ: SUBCUTANEOUS | 11 refills | Status: DC

## 2020-06-08 MED ORDER — TAMSULOSIN HCL 0.4 MG PO CAPS: 0.4000 mg | ORAL_CAPSULE | Freq: Every day | ORAL | 11 refills | Status: DC

## 2020-06-08 MED ORDER — FLUTICASONE PROPIONATE 50 MCG/ACT NA SUSP: 2.0000 | Freq: Every day | NASAL | 1 refills | Status: DC

## 2020-06-08 MED ORDER — HYDROCHLOROTHIAZIDE 25 MG PO TABS
25.0000 mg | ORAL_TABLET | Freq: Every day | ORAL | 1 refills | Status: DC
Start: 2020-06-08 — End: 2021-06-12

## 2020-06-08 NOTE — Telephone Encounter (Signed)
Rx sent to requested pharmacy

## 2020-06-08 NOTE — Addendum Note (Signed)
Addended by: Ebbie Latus on: 06/08/2020 04:01 PM   Modules accepted: Orders

## 2020-06-08 NOTE — Telephone Encounter (Signed)
Sister is requesting all meds sent to [facility] .

## 2020-06-08 NOTE — Telephone Encounter (Signed)
Pt's sister requesting to speak with a nurse about meds, please call back.

## 2020-06-08 NOTE — Telephone Encounter (Signed)
Pt's sister requesting to speak with Glenda. Please call pt back.

## 2020-06-08 NOTE — Telephone Encounter (Signed)
Pt's sister stated pt has new insurance, [facility] - need to [facility]

## 2020-06-20 ENCOUNTER — Telehealth: Payer: Self-pay | Admitting: *Deleted

## 2020-06-20 NOTE — Telephone Encounter (Addendum)
Fax from [facility] is not covered by patient's insurance.  Suggest change to [facility] for Diabetes supplies.  Patient will need lancets, meter and Strips.and strips.  Message to be sent to the Yellow Team to make change.  Sander Nephew, RN 06/20/2020 4:53 PM

## 2020-07-08 ENCOUNTER — Telehealth: Payer: Self-pay | Admitting: *Deleted

## 2020-07-08 NOTE — Telephone Encounter (Signed)
NOTES ON FILE FROM [facility], Andree Moro, PCP [phone]. REFERRAL SENT TO SCHEDULING.

## 2020-07-21 DIAGNOSIS — E785 Hyperlipidemia, unspecified: Secondary | ICD-10-CM | POA: Insufficient documentation

## 2020-07-21 DIAGNOSIS — E118 Type 2 diabetes mellitus with unspecified complications: Secondary | ICD-10-CM | POA: Insufficient documentation

## 2020-07-21 NOTE — Progress Notes (Signed)
Cardiology Office Note   Date:  07/22/2020   ID:  PHILLP Soto, DOB 1960/07/24, MRN [id number]  PCP:  Harvie Heck, MD  Cardiologist:   No primary care provider on file.   No chief complaint on file.     History of Present Illness: Kevin Soto is a 60 y.o. male who presents for follow up of CAD.  I have not seen him since 2019.  Since I last saw him he has had no new cardiac complaints.  He says he has been going to do some swimming but the family member says he has not been doing any of that.  Watching him walk any a slow shuffling gait that has been slowly progressive.  He does have some walker or canes.  Currently does not use them.  Is not had any falls.  He is not describing chest pressure, neck or arm discomfort.  There has been no new palpitations, presyncope or syncope.  He had no weight gain or edema.     Past Medical History:  Diagnosis Date  . Anemia   . CAD (coronary artery disease)    cath 8/12:  LM ok, mLAD occluded with trivial collats from right AM and dRCA, mDx (small) 80%, RI 25%, tiny branch of OM 90%, mAM 30-40%, RCA sub-totally occluded, then 80%, EF 40%, inf HK.  He underwent PCI with [name] with placement of a Promus DES to the mRCA x 2  . Chronic kidney disease    stage 3 per chart  . Diabetes mellitus   . Dyslipidemia   . ED (erectile dysfunction)   . Gait instability 02/27/2011  . History of alcohol abuse   . History of stomach ulcers   . History of stroke    evidence of CVA on MRI in past  . Hyperlipidemia   . Hypertension   . Ischemic cardiomyopathy    echo 7/12: EF 40-45%, septal, apical, inf basal HK, mod LVH, mild MR, mild LAE.  EF 55% by echo 2015  . Macular hemorrhage   . Prostate cancer ([place])   . Sleep apnea    no c-pap; wife denies  . Vision, loss, sudden 02/27/2011    Past Surgical History:  Procedure Laterality Date  . CORONARY ANGIOPLASTY WITH STENT PLACEMENT  2012   2 stents  . RADIOACTIVE SEED IMPLANT N/A  05/08/2017   Procedure: RADIOACTIVE SEED IMPLANT/BRACHYTHERAPY IMPLANT;  Surgeon: Alexis Frock, MD;  Location: [facility];  Service: Urology;  Laterality: N/A;  . REFRACTIVE SURGERY  2012   right and left eye/ per pt only left eye  . SPACE OAR INSTILLATION N/A 05/08/2017   Procedure: SPACE OAR INSTILLATION;  Surgeon: Alexis Frock, MD;  Location: [facility];  Service: Urology;  Laterality: N/A;     Current Outpatient Medications  Medication Sig Dispense Refill  . Accu-Chek FastClix Lancets MISC Use to check blood sugar up to 7 times a week 102 each 4  . aspirin EC 81 MG tablet Take 1 tablet (81 mg total) by mouth daily. 30 tablet 11  . Blood Glucose Monitoring Suppl (ACCU-CHEK GUIDE) w/Device KIT 1 each by Does not apply route 2 (two) times daily. 1 kit 0  . carvedilol (COREG) 25 MG tablet TAKE 1 TABLET BY MOUTH  TWICE DAILY WITH A MEAL 180 tablet 3  . clopidogrel (PLAVIX) 75 MG tablet Take 1 tablet (75 mg total) by mouth daily. 90 tablet 3  . Dulaglutide (TRULICITY) 1.5 ML/5.4GB  SOPN INJECT SUBCUTANEOUSLY 1.5  MG INTO THE SKIN WEEKLY 2 mL 11  . Empagliflozin-metFORMIN HCl (SYNJARDY) 09-998 MG TABS Take 1 tablet by mouth 2 (two) times daily. 180 tablet 3  . fluticasone (FLONASE) 50 MCG/ACT nasal spray Place 2 sprays into both nostrils daily. 16 g 1  . glucose blood (ACCU-CHEK GUIDE) test strip Check blood sugar up to 7 times a week 100 each 4  . hydrochlorothiazide (HYDRODIURIL) 25 MG tablet Take 1 tablet (25 mg total) by mouth daily. 90 tablet 1  . lisinopril (ZESTRIL) 20 MG tablet Take 1 tablet (20 mg total) by mouth daily. 90 tablet 3  . nitroGLYCERIN (NITROSTAT) 0.4 MG SL tablet Place 1 tablet (0.4 mg total) under the tongue every 5 (five) minutes as needed. 100 tablet 1  . tamsulosin (FLOMAX) 0.4 MG CAPS capsule Take 1 capsule (0.4 mg total) by mouth daily. 30 capsule 11  . atorvastatin (LIPITOR) 80 MG tablet Take 1 tablet (80 mg total) by mouth  daily. 90 tablet 3  . dorzolamide-timolol (COSOPT) 22.3-6.8 MG/ML ophthalmic solution      No current facility-administered medications for this visit.    Allergies:   Clonidine derivatives    ROS:  Please see the history of present illness.   Otherwise, review of systems are positive for none.   All other systems are reviewed and negative.    PHYSICAL EXAM: VS:  BP 130/80   Pulse 94   Ht 6' (1.829 m)   Wt 218 lb 9.6 oz (99.2 kg)   SpO2 98%   BMI 29.65 kg/m  , BMI Body mass index is 29.65 kg/m. GENERAL:  Well appearing NECK:  No jugular venous distention, waveform within normal limits, carotid upstroke brisk and symmetric, no bruits, no thyromegaly LUNGS:  Clear to auscultation bilaterally CHEST:  Unremarkable HEART:  PMI not displaced or sustained,S1 and S2 within normal limits, no S3, no S4, no clicks, no rubs, no murmurs ABD:  Flat, positive bowel sounds normal in frequency in pitch, no bruits, no rebound, no guarding, no midline pulsatile mass, no hepatomegaly, no splenomegaly EXT:  2 plus pulses throughout, no edema, no cyanosis no clubbing   EKG:  EKG is  ordered today. The ekg ordered today demonstrates sinus rhythm, rate 94, axis within normal limits, intervals within normal limits, no acute ST-T wave changes.  Inferolateral T wave inversions are not changed from previous.   Recent Labs: 10/26/2019: BUN 38; Creatinine, Ser 1.82; Potassium 4.9; Sodium 140    Lipid Panel    Component Value Date/Time   CHOL 187 10/26/2019 1535   TRIG 106 10/26/2019 1535   HDL 47 10/26/2019 1535   CHOLHDL 4.0 10/26/2019 1535   CHOLHDL 3.3 08/17/2014 1532   VLDL 17 08/17/2014 1532   LDLCALC 121 (H) 10/26/2019 1535      Wt Readings from Last 3 Encounters:  07/22/20 218 lb 9.6 oz (99.2 kg)  10/26/19 225 lb 1.6 oz (102.1 kg)  06/12/18 213 lb 1.6 oz (96.7 kg)      Other studies Reviewed: Additional studies/ records that were reviewed today include: Labs. Review of the above  records demonstrates:  Please see elsewhere in the note.     ASSESSMENT AND PLAN:  CAD:    The patient has no new sypmtoms.  No further cardiovascular testing is indicated.  We will continue with aggressive risk reduction and meds as listed.  He needs better risk reduction  HTN:   The blood pressure is at target.  No change in therapy.   DYSLIPIDEMIA: LDL was 121 last year.  I am going to increase his Lipitor empirically to 80 mg daily and get another lipid profile in about 8 to 10 weeks. s than 70.  DM: A1c was 7.8 up from 7.4.  Therapy per Harvie Heck, MD.  His family member says that he is not able to check his sugars because he cannot get blood out of his fingers.  I sent a message to his primary provider to see if they can see him back and maybe arrange some other means of surveillance.   Current medicines are reviewed at length with the patient today.  The patient does not have concerns regarding medicines.  The following changes have been made:  As above  Labs/ tests ordered today include:   Orders Placed This Encounter  Procedures  . Lipid panel  . Hepatic function panel  . Ambulatory referral to Physical Therapy  . EKG 12-Lead     Disposition:   FU with me in one year.     Signed, Minus Breeding, MD  07/22/2020 10:06 AM    [place]

## 2020-07-22 ENCOUNTER — Other Ambulatory Visit: Payer: Self-pay

## 2020-07-22 ENCOUNTER — Encounter: Payer: Self-pay | Admitting: Cardiology

## 2020-07-22 ENCOUNTER — Ambulatory Visit: Payer: Medicare Other | Admitting: Cardiology

## 2020-07-22 VITALS — BP 130/80 | HR 94 | Ht 72.0 in | Wt 218.6 lb

## 2020-07-22 DIAGNOSIS — I10 Essential (primary) hypertension: Secondary | ICD-10-CM | POA: Diagnosis not present

## 2020-07-22 DIAGNOSIS — E785 Hyperlipidemia, unspecified: Secondary | ICD-10-CM

## 2020-07-22 DIAGNOSIS — E118 Type 2 diabetes mellitus with unspecified complications: Secondary | ICD-10-CM | POA: Diagnosis not present

## 2020-07-22 DIAGNOSIS — R531 Weakness: Secondary | ICD-10-CM

## 2020-07-22 DIAGNOSIS — E1149 Type 2 diabetes mellitus with other diabetic neurological complication: Secondary | ICD-10-CM

## 2020-07-22 DIAGNOSIS — R269 Unspecified abnormalities of gait and mobility: Secondary | ICD-10-CM

## 2020-07-22 DIAGNOSIS — I2510 Atherosclerotic heart disease of native coronary artery without angina pectoris: Secondary | ICD-10-CM | POA: Diagnosis not present

## 2020-07-22 MED ORDER — ATORVASTATIN CALCIUM 80 MG PO TABS: 80.0000 mg | ORAL_TABLET | Freq: Every day | ORAL | 3 refills | Status: DC

## 2020-07-22 NOTE — Patient Instructions (Signed)
Medication Instructions:  INCREASE LIPITOR TO 80MG  DAILY *If you need a refill on your cardiac medications before your next appointment, please call your pharmacy*  Lab Work: Your physician recommends that you return for lab work in: 8 weeks (Lipid / Liver) - 09/16/2020 If you have labs (blood work) drawn today and your tests are completely normal, you will receive your results only by: Marland Kitchen MyChart Message (if you have MyChart) OR . A paper copy in the mail If you have any lab test that is abnormal or we need to change your treatment, we will call you to review the results.  Testing/Procedures: Referred to physical therapy  Follow-Up: At [facility], you and your health needs are our priority.  As part of our continuing mission to provide you with exceptional heart care, we have created designated Provider Care Teams.  These Care Teams include your primary Cardiologist (physician) and Advanced Practice Providers (APPs -  Physician Assistants and Nurse Practitioners) who all work together to provide you with the care you need, when you need it.  Your next appointment:   12 month(s) You will receive a reminder letter in the mail two months in advance. If you don't receive a letter, please call our office to schedule the follow-up appointment.  The format for your next appointment:   In Person  Provider:   Minus Breeding, MD

## 2020-07-29 ENCOUNTER — Ambulatory Visit (INDEPENDENT_AMBULATORY_CARE_PROVIDER_SITE_OTHER): Payer: Medicare Other | Admitting: Student

## 2020-07-29 ENCOUNTER — Encounter: Payer: Self-pay | Admitting: Student

## 2020-07-29 ENCOUNTER — Other Ambulatory Visit: Payer: Self-pay

## 2020-07-29 VITALS — BP 119/85 | HR 99 | Temp 98.6°F | Ht 72.0 in | Wt 221.8 lb

## 2020-07-29 DIAGNOSIS — I6310 Cerebral infarction due to embolism of unspecified precerebral artery: Secondary | ICD-10-CM | POA: Diagnosis not present

## 2020-07-29 DIAGNOSIS — R2681 Unsteadiness on feet: Secondary | ICD-10-CM

## 2020-07-29 DIAGNOSIS — E1149 Type 2 diabetes mellitus with other diabetic neurological complication: Secondary | ICD-10-CM | POA: Diagnosis not present

## 2020-07-29 DIAGNOSIS — D649 Anemia, unspecified: Secondary | ICD-10-CM | POA: Diagnosis not present

## 2020-07-29 DIAGNOSIS — Z0000 Encounter for general adult medical examination without abnormal findings: Secondary | ICD-10-CM

## 2020-07-29 LAB — POCT GLYCOSYLATED HEMOGLOBIN (HGB A1C): Hemoglobin A1C: 6.5 % — AB (ref 4.0–5.6)

## 2020-07-29 LAB — GLUCOSE, CAPILLARY: Glucose-Capillary: 186 mg/dL — ABNORMAL HIGH (ref 70–99)

## 2020-07-29 NOTE — Assessment & Plan Note (Addendum)
Patient reports compliance with diabetes medications. Takes Trulicity weekly and daily Synjardy, no complications. Denies chest pain, dyspnea, neuropathic pain, changes in vision. Also denies any hypoglycemic events. Mentions it is difficult to get blood from fingers to check sugars at home.  A/P: Unlikely patient will need CGM monitoring given his diabetes is relatively controlled off insulin, but could benefit from nutritionist and further diabetes education. Will check A1c today and adjust medications as needed. - A1c today - Continue weekly Trulicity, daily Synjardy - BMET at next visit

## 2020-07-29 NOTE — Assessment & Plan Note (Deleted)
Patient states he has had COVID-19 vaccines x3, will bring card next time.  No previous colonoscopy, referral placed today.

## 2020-07-29 NOTE — Assessment & Plan Note (Signed)
Patient's sister with patient in office today. Reports patient has had chronic difficulty walking since his stroke. States he often keeps his head down and shuffles when he walks. Denies falls, trauma, injury, or acute change in gait. Reports he has never worked with physical therapy. - Referral for physical therapy

## 2020-07-29 NOTE — Patient Instructions (Signed)
Kevin Soto,  It was a pleasure seeing you today!  Today we discussed your diabetes. We will re-check an A1c today and refer you to our diabetes educator. Until you see your diabetes educator, you can hold off checking sugars for now.  Otherwise, I have put in a referral for physical therapy and for a colonoscopy. They will contact you to schedule an appointment.  We look forward to seeing you next time. Please call our clinic at [phone] if you have any questions or concerns. The best time to call is Monday-Friday from 9am-4pm, but there is someone available 24/7 at the same number. If you need medication refills, please notify your pharmacy one week in advance and they will send [place] a request.  Thank you for letting us take part in your care. Wishing you the best!  Thank you, Dr. Sanjuan Dame, MD

## 2020-07-29 NOTE — Assessment & Plan Note (Signed)
Patient states he has had COVID-19 vaccines x3, will bring card next time.  Patient had eye exam February 2022.  No previous colonoscopy, referral placed today.

## 2020-07-29 NOTE — Progress Notes (Signed)
   CC: routine follow-up  HPI:  [name] is a 60 y.o.  With CAD s/p PCI, CKDIIIa, previous CVA, hypertension, hyperlipidemia presenting to [facility] for follow-up for his chronic medical conditions.  Please see problem-based list for further details, assessments, and plans.  Past Medical History:  Diagnosis Date  . Anemia   . CAD (coronary artery disease)    cath 8/12:  LM ok, mLAD occluded with trivial collats from right AM and dRCA, mDx (small) 80%, RI 25%, tiny branch of OM 90%, mAM 30-40%, RCA sub-totally occluded, then 80%, EF 40%, inf HK.  He underwent PCI with [name] with placement of a Promus DES to the mRCA x 2  . Chronic kidney disease    stage 3 per chart  . Diabetes mellitus   . Dyslipidemia   . ED (erectile dysfunction)   . Gait instability 02/27/2011  . History of alcohol abuse   . History of stomach ulcers   . History of stroke    evidence of CVA on MRI in past  . Hyperlipidemia   . Hypertension   . Ischemic cardiomyopathy    echo 7/12: EF 40-45%, septal, apical, inf basal HK, mod LVH, mild MR, mild LAE.  EF 55% by echo 2015  . Macular hemorrhage   . Prostate cancer ([place])   . Sleep apnea    no c-pap; wife denies  . Vision, loss, sudden 02/27/2011   Review of Systems:  As per HPI  Physical Exam:  Vitals:   07/29/20 1113  BP: 119/85  Pulse: 99  Temp: 98.6 F (37 C)  TempSrc: Oral  SpO2: 98%  Weight: 221 lb 12.8 oz (100.6 kg)  Height: 6' (1.829 m)   General: Pleasant, sitting comfortably in chair, no acute distress CV: Regular rate, rhythm. No murmurs, rubs, gallops Pulm: Normal work of breathing, clear to auscultation bilaterally. MSK: Normal bulk, tone. No pitting edema bilaterally.  Assessment & Plan:   See Encounters Tab for problem based charting.  Patient discussed with Dr. Dareen Piano

## 2020-08-01 ENCOUNTER — Other Ambulatory Visit: Payer: Self-pay | Admitting: Student

## 2020-08-01 NOTE — Progress Notes (Signed)
Internal Medicine Clinic Attending ? ?Case discussed with Dr. Braswell  At the time of the visit.  We reviewed the resident?s history and exam and pertinent patient test results.  I agree with the assessment, diagnosis, and plan of care documented in the resident?s note.  ?

## 2020-08-09 ENCOUNTER — Encounter: Payer: Self-pay | Admitting: Dietician

## 2020-08-09 ENCOUNTER — Other Ambulatory Visit: Payer: Self-pay

## 2020-08-09 ENCOUNTER — Ambulatory Visit (INDEPENDENT_AMBULATORY_CARE_PROVIDER_SITE_OTHER): Payer: Medicare Other | Admitting: Dietician

## 2020-08-09 DIAGNOSIS — E1149 Type 2 diabetes mellitus with other diabetic neurological complication: Secondary | ICD-10-CM

## 2020-08-09 NOTE — Progress Notes (Addendum)
Medical Nutrition Therapy:  Appt start time: 0805 end time:  0850 total minutes- 45 minutes. Last visit was 04/2018  Assessment:  Primary concerns today: Blood sugar control, Meal planning and Annual Review.  Kevin Soto is here with his sister who cooks and shops for him for a review of his diabetes self care. He reports taking his medicines without problems(niece gives Trulicity once a week, organizes other meds in pill box every week) . His blood pressure and sugar are well controlled. His weight is stable. They did not bring his meter today. The sister who helps him with his diabetes is currently in the hospital. She was having a difficult time getting enough blood.  I demonstrated how to help get blood out of fingers. His blood sugars are doing well. They asked about CGM and this coverage and options were discussed. Would like to see his weight decrease ~ 10#.  He has stage 3 Chronic Kidney disease. He is on an SGLT2i and GLP-1.   Exercise: Uses stationary bike & treadmill   24 hr recall:  Breakfast (9:00)- [place] Clap croissant, sausage, egg, & cheese, diet coke or coffee w/1 tbsp cream and 2 tsp sugar or OJ or water; sometimes cornflakes w/2% milk & banana  Lunch (12-1:00)- ham & cheese sandwich on whole wheat bread & mayo, diet Coke  Dinner (5:30)- steak, sweet potato w/butter, white & brown sugar, broccoli w/olive oil & salt and pepper; most nights baked chicken or pork chop, sometimes fish  Snack- orange or apple or snack bar Drinks- diet coke, water, coffee, juice  Lab Results  Component Value Date   HGBA1C 6.5 (A) 07/29/2020   HGBA1C 7.8 (A) 10/26/2019   HGBA1C 10.3 (A) 05/06/2018   HGBA1C 7.4 (A) 11/05/2017   HGBA1C 9.3 07/02/2017    Estimated body mass index is 30.27 kg/m as calculated from the following:   Height as of 07/29/20: 6' (1.829 m).   Weight as of this encounter: 223 lb 3.2 oz (101.2 kg).  Wt Readings from Last 10 Encounters:  08/09/20 223 lb 3.2 oz (101.2 kg)   07/29/20 221 lb 12.8 oz (100.6 kg)  07/22/20 218 lb 9.6 oz (99.2 kg)  10/26/19 225 lb 1.6 oz (102.1 kg)  06/12/18 213 lb 1.6 oz (96.7 kg)  05/19/18 213 lb 8 oz (96.8 kg)  05/12/18 213 lb 11.2 oz (96.9 kg)  05/06/18 218 lb 6.4 oz (99.1 kg)  03/27/18 217 lb (98.4 kg)  11/12/17 213 lb 14.4 oz (97 kg)   BP Readings from Last 3 Encounters:  07/29/20 119/85  07/22/20 130/80  10/26/19 (!) 122/92   Lab Results  Component Value Date   LDLCALC 121 (H) 10/26/2019   Lab Results  Component Value Date   CREATININE 1.82 (H) 10/26/2019   Eye exam in 2/22 from who-[name] covid vax cards- sister will fax or bring to future visit LDL repeat on 07/22/20 per Dr. Charlesetta Shanks Monitoring- they did not bring his meter, sister who usually helps with medicine and monitoring is sick. Niece can help. Educated about his current medicines and low/no risk of hypoglycemia, so self monitoring is optional.   Progress Towards Goal(s):  Some progress.   Nutritional Diagnosis:  NB-1.6 Limited adherence to nutrition-related recommendations As related to eating well balanced heart healthy meals/snacks has improved.  As evidenced by his and his sister's report and a1c, blood pressure and stabel weight. .    Intervention:  Nutrition education about foods recommended to lower his LDL and  improve glycemic control, how to get enough blood with lancing device.   Monitoring/Evaluation:  Dietary intake, exercise, meter as needed and body weight in 1 year(s)  Kevin Soto, RD 08/09/2020 9:49 AM. .

## 2020-08-09 NOTE — Patient Instructions (Addendum)
Checking blood sugar- massage hands and fingers, wash in warm water, rubbing and massaging fingers.    Nutrition-  1- try to get fish in 3 times a week 2- drink more water, coffee is okay, tea, a little juice 3- How to make current diet more heart healthy:  - [place] bacon and 2% fat cheese, [place] OR Consulting civil engineer Delights breakfast sandwiches  - Wheaties, Raisin Bran, Cheerios with banana or fruit  - Use Country Crock or Can't Believe its not butter  - Continue with chicken and fish!   May want to call Dr. Timoteo Expose office about the labs that are pending- his cholesterol.   Follow up with diabetes educator in 1 year.  Or sooner if you have questions or concerns.   [name] [phone]

## 2020-08-12 ENCOUNTER — Encounter: Payer: Self-pay | Admitting: *Deleted

## 2020-08-15 ENCOUNTER — Encounter: Payer: Self-pay | Admitting: *Deleted

## 2020-09-12 NOTE — Addendum Note (Signed)
Addended by: Hulan Fray on: 09/12/2020 06:17 PM   Modules accepted: Orders

## 2020-09-14 NOTE — Addendum Note (Signed)
Addended by: Hulan Fray on: 09/14/2020 05:21 PM   Modules accepted: Orders

## 2020-09-22 ENCOUNTER — Telehealth: Payer: Self-pay | Admitting: Dietician

## 2020-09-22 DIAGNOSIS — E118 Type 2 diabetes mellitus with unspecified complications: Secondary | ICD-10-CM

## 2020-09-22 NOTE — Telephone Encounter (Signed)
Explained to patient we got a fax asking him to change blood glucose meter and supplies. He states that he checks blood sugars once a day and is okay with [place] changing his meter and supplies.He said he would share this with his sister.

## 2020-09-23 MED ORDER — ONETOUCH DELICA PLUS LANCET33G MISC: 3 refills | Status: DC

## 2020-09-23 MED ORDER — ONETOUCH VERIO REFLECT W/DEVICE KIT: PACK | 0 refills | Status: DC

## 2020-09-23 MED ORDER — ONETOUCH VERIO VI STRP: ORAL_STRIP | 3 refills | Status: DC

## 2020-09-23 NOTE — Telephone Encounter (Signed)
Orders signed.  Thank you.

## 2020-09-28 ENCOUNTER — Telehealth: Payer: Self-pay | Admitting: Cardiology

## 2020-09-28 NOTE — Telephone Encounter (Signed)
Left message for patient to call and discuss scheduling the Physical Therapy that was ordered by [name]

## 2020-11-08 ENCOUNTER — Ambulatory Visit: Payer: Medicare Other

## 2020-11-15 ENCOUNTER — Encounter: Payer: Self-pay | Admitting: Podiatry

## 2020-11-15 ENCOUNTER — Ambulatory Visit (INDEPENDENT_AMBULATORY_CARE_PROVIDER_SITE_OTHER): Payer: Medicare Other | Admitting: Podiatry

## 2020-11-15 ENCOUNTER — Other Ambulatory Visit: Payer: Self-pay

## 2020-11-15 DIAGNOSIS — D689 Coagulation defect, unspecified: Secondary | ICD-10-CM | POA: Diagnosis not present

## 2020-11-15 DIAGNOSIS — B351 Tinea unguium: Secondary | ICD-10-CM | POA: Diagnosis not present

## 2020-11-15 DIAGNOSIS — E119 Type 2 diabetes mellitus without complications: Secondary | ICD-10-CM

## 2020-11-15 DIAGNOSIS — M79676 Pain in unspecified toe(s): Secondary | ICD-10-CM | POA: Diagnosis not present

## 2020-11-15 NOTE — Progress Notes (Signed)
This patient returns to my office for at risk foot care.  This patient requires this care by a professional since this patient will be at risk due to having CKD stage 3 due to type 2 diabetes  CVA, and coagulation defect.  Patient is taking plavix.Marland Kitchen  He presents to the office with male caregiver since he has lost his wife within the last year.    This patient is unable to cut nails himself since the patient cannot reach his nails.These nails are painful walking and wearing shoes.  This patient presents for at risk foot care today.  General Appearance  Alert, conversant and in no acute stress.  Vascular  Dorsalis pedis and posterior tibial  pulses are palpable  bilaterally.  Capillary return is within normal limits  bilaterally. Temperature is within normal limits  bilaterally.  Neurologic  Deferred.  Nails Thick disfigured discolored nails with subungual debris  from hallux to fifth toes bilaterally. No evidence of bacterial infection or drainage bilaterally.  Orthopedic  No limitations of motion  feet .  No crepitus or effusions noted.  No bony pathology or digital deformities noted. HAV  B/L.  Skin  normotropic skin with no porokeratosis noted bilaterally.  No signs of infections or ulcers noted.     Onychomycosis  Pain in right toes  Pain in left toes  Consent was obtained for treatment procedures.   Mechanical debridement of nails 1-5  bilaterally performed with a nail nipper.  Filed with dremel without incident.    Return office visit    prn                Told patient to return for periodic foot care and evaluation due to potential at risk complications.   Gardiner Barefoot DPM

## 2021-01-19 ENCOUNTER — Emergency Department (HOSPITAL_COMMUNITY)
Admission: EM | Admit: 2021-01-19 | Discharge: 2021-01-19 | Disposition: A | Discharge status: Home / Self Care | Payer: Medicare Other | Attending: Emergency Medicine | Admitting: Emergency Medicine

## 2021-01-19 ENCOUNTER — Emergency Department (HOSPITAL_COMMUNITY): Payer: Medicare Other

## 2021-01-19 ENCOUNTER — Encounter (HOSPITAL_COMMUNITY): Payer: Self-pay | Admitting: *Deleted

## 2021-01-19 ENCOUNTER — Other Ambulatory Visit: Payer: Self-pay

## 2021-01-19 DIAGNOSIS — R531 Weakness: Secondary | ICD-10-CM | POA: Insufficient documentation

## 2021-01-19 DIAGNOSIS — I25119 Atherosclerotic heart disease of native coronary artery with unspecified angina pectoris: Secondary | ICD-10-CM | POA: Insufficient documentation

## 2021-01-19 DIAGNOSIS — Z8546 Personal history of malignant neoplasm of prostate: Secondary | ICD-10-CM | POA: Diagnosis not present

## 2021-01-19 DIAGNOSIS — Z20822 Contact with and (suspected) exposure to covid-19: Secondary | ICD-10-CM | POA: Diagnosis not present

## 2021-01-19 DIAGNOSIS — Z951 Presence of aortocoronary bypass graft: Secondary | ICD-10-CM | POA: Diagnosis not present

## 2021-01-19 DIAGNOSIS — Z7982 Long term (current) use of aspirin: Secondary | ICD-10-CM | POA: Insufficient documentation

## 2021-01-19 DIAGNOSIS — E1122 Type 2 diabetes mellitus with diabetic chronic kidney disease: Secondary | ICD-10-CM | POA: Insufficient documentation

## 2021-01-19 DIAGNOSIS — Z79899 Other long term (current) drug therapy: Secondary | ICD-10-CM | POA: Diagnosis not present

## 2021-01-19 DIAGNOSIS — R61 Generalized hyperhidrosis: Secondary | ICD-10-CM | POA: Insufficient documentation

## 2021-01-19 DIAGNOSIS — N1831 Chronic kidney disease, stage 3a: Secondary | ICD-10-CM | POA: Insufficient documentation

## 2021-01-19 DIAGNOSIS — I129 Hypertensive chronic kidney disease with stage 1 through stage 4 chronic kidney disease, or unspecified chronic kidney disease: Secondary | ICD-10-CM | POA: Diagnosis not present

## 2021-01-19 DIAGNOSIS — R0789 Other chest pain: Secondary | ICD-10-CM | POA: Insufficient documentation

## 2021-01-19 DIAGNOSIS — Z7984 Long term (current) use of oral hypoglycemic drugs: Secondary | ICD-10-CM | POA: Insufficient documentation

## 2021-01-19 DIAGNOSIS — Z7902 Long term (current) use of antithrombotics/antiplatelets: Secondary | ICD-10-CM | POA: Diagnosis not present

## 2021-01-19 DIAGNOSIS — R4182 Altered mental status, unspecified: Secondary | ICD-10-CM | POA: Insufficient documentation

## 2021-01-19 DIAGNOSIS — R569 Unspecified convulsions: Secondary | ICD-10-CM

## 2021-01-19 LAB — COMPREHENSIVE METABOLIC PANEL
ALT: 13 U/L (ref 0–44)
AST: 17 U/L (ref 15–41)
Albumin: 3 g/dL — ABNORMAL LOW (ref 3.5–5.0)
Alkaline Phosphatase: 93 U/L (ref 38–126)
Anion gap: 9 (ref 5–15)
BUN: 11 mg/dL (ref 8–23)
CO2: 23 mmol/L (ref 22–32)
Calcium: 9 mg/dL (ref 8.9–10.3)
Chloride: 106 mmol/L (ref 98–111)
Creatinine, Ser: 1.69 mg/dL — ABNORMAL HIGH (ref 0.61–1.24)
GFR, Estimated: 45 mL/min — ABNORMAL LOW (ref >60)
Glucose, Bld: 183 mg/dL — ABNORMAL HIGH (ref 70–99)
Potassium: 3.3 mmol/L — ABNORMAL LOW (ref 3.5–5.1)
Sodium: 138 mmol/L (ref 135–145)
Total Bilirubin: 1 mg/dL (ref 0.3–1.2)
Total Protein: 7.2 g/dL (ref 6.5–8.1)

## 2021-01-19 LAB — CBC WITH DIFFERENTIAL/PLATELET
Abs Immature Granulocytes: 0.03 10*3/uL (ref 0.00–0.07)
Basophils Absolute: 0 10*3/uL (ref 0.0–0.1)
Basophils Relative: 0 %
Eosinophils Absolute: 0.2 10*3/uL (ref 0.0–0.5)
Eosinophils Relative: 2 %
HCT: 44.2 % (ref 39.0–52.0)
Hemoglobin: 14.2 g/dL (ref 13.0–17.0)
Immature Granulocytes: 0 %
Lymphocytes Relative: 13 %
Lymphs Abs: 1.1 10*3/uL (ref 0.7–4.0)
MCH: 27.4 pg (ref 26.0–34.0)
MCHC: 32.1 g/dL (ref 30.0–36.0)
MCV: 85.2 fL (ref 80.0–100.0)
Monocytes Absolute: 0.8 10*3/uL (ref 0.1–1.0)
Monocytes Relative: 9 %
Neutro Abs: 6.7 10*3/uL (ref 1.7–7.7)
Neutrophils Relative %: 76 %
Platelets: 334 10*3/uL (ref 150–400)
RBC: 5.19 MIL/uL (ref 4.22–5.81)
RDW: 15.2 % (ref 11.5–15.5)
WBC: 8.8 10*3/uL (ref 4.0–10.5)
nRBC: 0 % (ref 0.0–0.2)

## 2021-01-19 LAB — TROPONIN I (HIGH SENSITIVITY)
Troponin I (High Sensitivity): 12 ng/L (ref <18)
Troponin I (High Sensitivity): 12 ng/L (ref <18)

## 2021-01-19 LAB — ETHANOL: Alcohol, Ethyl (B): <10 mg/dL (ref <10)

## 2021-01-19 LAB — CBG MONITORING, ED: Glucose-Capillary: 176 mg/dL — ABNORMAL HIGH (ref 70–99)

## 2021-01-19 LAB — AMMONIA: Ammonia: 35 umol/L (ref 9–35)

## 2021-01-19 LAB — SARS CORONAVIRUS 2 (TAT 6-24 HRS): SARS Coronavirus 2: NEGATIVE

## 2021-01-19 MED ORDER — IOHEXOL 350 MG/ML SOLN
75.0000 mL | Freq: Once | INTRAVENOUS | Status: AC | PRN
  Administered 2021-01-19: 75 mL via INTRAVENOUS

## 2021-01-19 NOTE — ED Provider Notes (Signed)
Emergency Medicine Provider Triage Evaluation Note  Kevin Soto , a 60 y.o. male  was evaluated in triage.  Pt complains of lethargy. Felt warm in car, started to sweat. Hx of DM took CBG was 170's. Seem confused in triage,. Diaphoretic. Urinated on himself  Review of Systems  Positive: Lethargic, confusion Negative: Pain, sobb  Physical Exam  BP (!) 167/119 (BP Location: Right Arm)   Pulse 91   Temp 98.2 F (36.8 C) (Oral)   Resp 18   SpO2 98%  Gen:   Awake, diaphoretic  Resp:  Normal effort  ABD:   soft MSK:   Moves extremities without difficulty  Neuro:  CN intact, equal grip Other:    Medical Decision Making  Medically screening exam initiated at 10:54 AM.  Appropriate orders placed.  Kevin Soto was informed that the remainder of the evaluation will be completed by another provider, this initial triage assessment does not replace that evaluation, and the importance of remaining in the ED until their evaluation is complete.  Lethargic, AMS  Needs room in back   Kevin Soto A, PA-C 01/19/21 1100    Kevin Muskrat, MD 01/19/21 [phone]

## 2021-01-19 NOTE — ED Provider Notes (Signed)
[facility] EMERGENCY DEPARTMENT Provider Note   CSN: [id number] Arrival date & time: 01/19/21  1043     History Chief Complaint  Patient presents with   Weakness    Kevin Soto is a 60 y.o. male with pertinent PMH of CAD s/p PCI, CKDIIIa, previous CVA, hypertension, hyperlipidemia presenting to the ED after feeling diaphoretic, weak and overall "not well". He had perfuse sweating while in triage, unable to recollect and provide a linear explanation of presentation today during my interview. Sister is in ED who states that they were sitting in the car when she noticed that he was less responsive than usual, starring into space, and complained of feeling hot. On exam, he smells of urine, concerning for loss of bladder, although sister denies loss of bowel or bladder during symptom onset. He denies SOB, chest pain, fevers, and chills. He has AMS but reports "feeling good", and has no complaints. About an hour later, he is back to baseline, with no ongoing confusion.    Weakness     Past Medical History:  Diagnosis Date   Anemia    CAD (coronary artery disease)    cath 8/12:  LM ok, mLAD occluded with trivial collats from right AM and dRCA, mDx (small) 80%, RI 25%, tiny branch of OM 90%, mAM 30-40%, RCA sub-totally occluded, then 80%, EF 40%, inf HK.  He underwent PCI with [name] with placement of a Promus DES to the mRCA x 2   Chronic kidney disease    stage 3 per chart   Diabetes mellitus    Dyslipidemia    ED (erectile dysfunction)    Gait instability 02/27/2011   History of alcohol abuse    History of stomach ulcers    History of stroke    evidence of CVA on MRI in past   Hyperlipidemia    Hypertension    Ischemic cardiomyopathy    echo 7/12: EF 40-45%, septal, apical, inf basal HK, mod LVH, mild MR, mild LAE.  EF 55% by echo 2015   Macular hemorrhage    Prostate cancer ([place])    Sleep apnea    no c-pap; wife denies   Vision, loss, sudden  02/27/2011    Patient Active Problem List   Diagnosis Date Noted   Type 2 diabetes mellitus with complication, without long-term current use of insulin ([place]) 07/21/2020   Dyslipidemia 07/21/2020   Memory change 05/07/2018   Chronic nasal congestion 11/12/2017   Malignant neoplasm of prostate ([place]) 03/11/2017   CAD (coronary artery disease)    Erectile dysfunction 05/08/2016   Advanced care planning/counseling discussion 01/31/2016   CKD stage 3 due to type 2 diabetes mellitus ([place]) 08/18/2014   Normocytic anemia 06/24/2012   Preventative health care 06/24/2012   Unsteady gait when walking 02/27/2011   Hyperlipidemia due to type 2 diabetes mellitus ([place]) 02/16/2011   Atherosclerotic heart disease of native coronary artery with unspecified angina pectoris ([place]) 01/26/2011   Diabetes mellitus type 2 with neurological manifestations ([place]) 12/15/2010   Dilated cardiomyopathy ([place])    Hypertension associated with diabetes ([place])    CVA (cerebral vascular accident) ([place]) 11/28/2010    Past Surgical History:  Procedure Laterality Date   CORONARY ANGIOPLASTY WITH STENT PLACEMENT  2012   2 stents   RADIOACTIVE SEED IMPLANT N/A 05/08/2017   Procedure: RADIOACTIVE SEED IMPLANT/BRACHYTHERAPY IMPLANT;  Surgeon: Alexis Frock, MD;  Location: [facility];  Service: Urology;  Laterality: N/A;   REFRACTIVE SURGERY  2012   right and left eye/ per pt only left eye   SPACE OAR INSTILLATION N/A 05/08/2017   Procedure: SPACE OAR INSTILLATION;  Surgeon: Alexis Frock, MD;  Location: [facility];  Service: Urology;  Laterality: N/A;       Family History  Adopted: Yes  Problem Relation Age of Onset   Heart attack Mother        Died 61   Heart failure Mother    Diabetes Mother    Heart attack Father    Heart failure Father    Diabetes Father    Prostate cancer Father    Cancer Father        Prostate   Heart disease Sister        s/p CABG x3 in her mid 71's    Crohn's disease Sister    Crohn's disease Other        niece   Kidney disease Brother     Social History   Tobacco Use   Smoking status: Never   Smokeless tobacco: Never  Vaping Use   Vaping Use: Never used  Substance Use Topics   Alcohol use: Never    Alcohol/week: 4.0 standard drinks    Types: 2 Cans of beer, 2 Shots of liquor per week   Drug use: No    Home Medications Prior to Admission medications   Medication Sig Start Date End Date Taking? Authorizing Provider  Accu-Chek FastClix Lancets MISC Use to check blood sugar up to 7 times a week 06/08/20   Harvie Heck, MD  aspirin EC 81 MG tablet Take 1 tablet (81 mg total) by mouth daily. 06/08/20   Harvie Heck, MD  atorvastatin (LIPITOR) 80 MG tablet Take 1 tablet (80 mg total) by mouth daily. 07/22/20   Minus Breeding, MD  Blood Glucose Monitoring Suppl (ONETOUCH VERIO REFLECT) w/Device KIT Check blood sugar 1 time a day 09/23/20   Riesa Pope, MD  carvedilol (COREG) 25 MG tablet TAKE 1 TABLET BY MOUTH  TWICE DAILY WITH A MEAL 06/08/20   Harvie Heck, MD  clopidogrel (PLAVIX) 75 MG tablet Take 1 tablet (75 mg total) by mouth daily. 06/08/20   Harvie Heck, MD  dorzolamide-timolol (COSOPT) 22.3-6.8 MG/ML ophthalmic solution  03/25/20   [provider]  Dulaglutide (TRULICITY) 1.5 PX/1.0GY SOPN INJECT SUBCUTANEOUSLY 1.5  MG INTO THE SKIN WEEKLY 06/08/20   Harvie Heck, MD  Empagliflozin-metFORMIN HCl (SYNJARDY) 09-998 MG TABS Take 1 tablet by mouth 2 (two) times daily. 06/08/20   Harvie Heck, MD  fluticasone (FLONASE) 50 MCG/ACT nasal spray Place 2 sprays into both nostrils daily. 06/08/20   Harvie Heck, MD  glucose blood (ONETOUCH VERIO) test strip Check blood sugar 1 time a day 09/23/20   Riesa Pope, MD  hydrochlorothiazide (HYDRODIURIL) 25 MG tablet Take 1 tablet (25 mg total) by mouth daily. 06/08/20   Harvie Heck, MD  Lancets [facility] DELICA PLUS [id number]) MISC Check blood sugar 1 time a day 09/23/20    Riesa Pope, MD  lisinopril (ZESTRIL) 20 MG tablet Take 1 tablet (20 mg total) by mouth daily. 06/08/20   Harvie Heck, MD  nitroGLYCERIN (NITROSTAT) 0.4 MG SL tablet Place 1 tablet (0.4 mg total) under the tongue every 5 (five) minutes as needed. 06/08/20   Harvie Heck, MD  tamsulosin (FLOMAX) 0.4 MG CAPS capsule Take 1 capsule (0.4 mg total) by mouth daily. 06/08/20   Harvie Heck, MD    Allergies    Clonidine derivatives  Review of  Systems   Negative ROS except as above.   Physical Exam Updated Vital Signs BP (!) 167/119 (BP Location: Right Arm)   Pulse 91   Temp 98.2 F (36.8 C) (Oral)   Resp 18   SpO2 98%   Physical Exam Constitutional: somnolent, unable to provide linear responses, in no acute distress HENT: normocephalic, atraumatic, mucous membranes moist Eyes: conjunctiva non-erythematous, extraocular movements intact Neck: supple Cardiovascular: regular rate and rhythm, no m/r/g Pulmonary/Chest: normal work of breathing on room air, CTAB Abdominal: soft, non-tender to palpation, non-distended MSK: normal bulk and tone Neurological: alert & oriented x 3, 5/5 strength in bilateral upper and lower extremities,  Skin: warm and dry   ED Results / Procedures / Treatments   Labs (all labs ordered are listed, but only abnormal results are displayed) Labs Reviewed  COMPREHENSIVE METABOLIC PANEL - Abnormal; Notable for the following components:      Result Value   Potassium 3.3 (*)    Glucose, Bld 183 (*)    Creatinine, Ser 1.69 (*)    Albumin 3.0 (*)    GFR, Estimated 45 (*)    All other components within normal limits  CBG MONITORING, ED - Abnormal; Notable for the following components:   Glucose-Capillary 176 (*)    All other components within normal limits  CBC WITH DIFFERENTIAL/PLATELET  ETHANOL  AMMONIA  URINALYSIS, ROUTINE W REFLEX MICROSCOPIC  RAPID URINE DRUG SCREEN, HOSP PERFORMED  TROPONIN I (HIGH SENSITIVITY)  TROPONIN I (HIGH SENSITIVITY)     EKG EKG Interpretation  Date/Time:  Thursday January 19 2021 10:50:07 EDT Ventricular Rate:  91 PR Interval:  166 QRS Duration: 90 QT Interval:  350 QTC Calculation: 430 R Axis:   32 Text Interpretation: Normal sinus rhythm Nonspecific T wave abnormality Abnormal ECG Confirmed by Carmin Muskrat [phone]) on 01/19/2021 1:05:17 PM  Radiology DG Chest 1 View  Result Date: 01/19/2021 CLINICAL DATA:  Sweats. EXAM: CHEST  1 VIEW COMPARISON:  April 04, 2017. FINDINGS: The heart size and mediastinal contours are within normal limits. Both lungs are clear. The visualized skeletal structures are unremarkable. IMPRESSION: No active disease. Electronically Signed   By: Marijo Conception M.D.   On: 01/19/2021 11:57   CT HEAD WO CONTRAST (5MM)  Result Date: 01/19/2021 CLINICAL DATA:  Delirium in a 60 year old male. EXAM: CT HEAD WITHOUT CONTRAST TECHNIQUE: Contiguous axial images were obtained from the base of the skull through the vertex without intravenous contrast. COMPARISON:  February 27, 2011 and February 28, 2011. FINDINGS: Brain: No evidence of hemorrhage, hydrocephalus, extra-axial collection or mass lesion/mass effect. Signs of atrophy and chronic microvascular ischemic change with interval worsening since previous imaging in a generalized fashion. Signs of LEFT brainstem infarct and further encephalomalacia within chronic ischemic changes in the LEFT cerebellum since previous imaging. Vascular: No hyperdense vessel or unexpected calcification. Skull: Normal. Negative for fracture or focal lesion. Sinuses/Orbits: Visualized paranasal sinuses and orbits without acute process. Stable increased density in the vitreous of the LEFT globe. Other: None. IMPRESSION: No acute intracranial abnormality. Worsening signs of atrophy and further encephalomalacia involving areas of white matter disease and prior infarct respectively. Stable increased density in the vitreous of the LEFT globe. Electronically Signed    By: Zetta Bills M.D.   On: 01/19/2021 12:26    Procedures Procedures   Medications Ordered in ED Medications - No data to display  ED Course  I have reviewed the triage vital signs and the nursing notes.  Pertinent labs & imaging  results that were available during my care of the patient were reviewed by me and considered in my medical decision making (see chart for details).  60 year old pt with weakness, diaphoresis, and AMS. Head CT negative for acute intracranial disease. Concern for aortic dissection given acute presentation and multiple comorbidity, CT angio dissection study with no signs of dissection.   CBG 176, not concerning for hypoglycemia. No concerning electrolyte derangement. He is afebrile, no leukocytosis, no thrombocytosis, and CXR negative, with low suspicion for infection. U/A pending. Trops flat. EtOH and ammonia levels wnl.  Pt has a hx of alcohol use disorder in the past, but denies currently drinking, with last drink a "long time ago". Less likely alcohol withdrawal given stable vitals and last drink was weeks ago.  Urine drug screen pending.  Clinical Course as of 01/19/21 1421  Thu Jan 19, 2021  1329 WBC: 8.8 [MP]  1333 Troponin I (High Sensitivity): 12 [MP]  1338 CT ANGIO CHEST AORTA W/CM & OR WO/CM [MP]  1406 CT ANGIO CHEST AORTA W/CM & OR WO/CM [MP]    Clinical Course User Index [MP] Lajean Manes, MD   MDM Rules/Calculators/A&P                           AMS likely 2/2 seizure given confusion and AMS, with post ictal confusion lasting up to 1 hour with return to baseline.  Final Clinical Impression(s) / ED Diagnoses Final diagnoses:  AMS (altered mental status)    Rx / DC Orders ED Discharge Orders     None        Lajean Manes, MD 01/19/21 1530    Carmin Muskrat, MD 01/19/21 [phone]

## 2021-01-19 NOTE — Discharge Instructions (Addendum)
Mr. Beswick you were brought to the ED because of weakness, sweating, and confusion. You were monitored for improvement. Labs and imaging were obtained, which showed that you did not have a stroke. You should follow up with a neurologist listed here on your discharge to evaluate you for this potential seizure.   Please return to the ED if you have any worsening confusion, or seizure like activity.

## 2021-01-19 NOTE — ED Notes (Signed)
Pt transported to CT via W/C. 

## 2021-01-19 NOTE — ED Triage Notes (Signed)
Pt reports feeling fine this am, went to pcp office for a routine checkup. Pt then had onset pta of "not feeling well" and breaking out in a hot sweat. Pt is very diaphoretic at triage. Denies pain or sob. Cbg and ekg done at triage.

## 2021-01-19 NOTE — ED Notes (Signed)
Patient transported to X-ray 

## 2021-01-19 NOTE — ED Notes (Signed)
Pt returned from CT scan- pt had small amount vomit in mask and on clothes. Has slurred speech, but is able to answer all questions. Equal grips, per CT tech and transport- pt too weak to stand up to transfer from CT table to w/c. Was placed on an ED stretcher transported to rm 7.  Assisted pt to get undressed, placed into a gown, on  Monitor,

## 2021-01-19 NOTE — ED Notes (Signed)
Pt's sister - who is his caregiver, is a pt in rm 3, fell in the parking lot. He has a visitor at the bedside- his nephew currently.

## 2021-01-25 ENCOUNTER — Ambulatory Visit: Payer: Medicare Other | Admitting: Neurology

## 2021-01-25 ENCOUNTER — Other Ambulatory Visit: Payer: Self-pay

## 2021-01-25 ENCOUNTER — Encounter: Payer: Self-pay | Admitting: Neurology

## 2021-01-25 VITALS — BP 148/89 | HR 102 | Ht 72.0 in | Wt 219.8 lb

## 2021-01-25 DIAGNOSIS — F0150 Vascular dementia without behavioral disturbance: Secondary | ICD-10-CM | POA: Diagnosis not present

## 2021-01-25 DIAGNOSIS — R404 Transient alteration of awareness: Secondary | ICD-10-CM

## 2021-01-25 DIAGNOSIS — H5462 Unqualified visual loss, left eye, normal vision right eye: Secondary | ICD-10-CM | POA: Diagnosis not present

## 2021-01-25 DIAGNOSIS — F01A0 Vascular dementia, mild, without behavioral disturbance, psychotic disturbance, mood disturbance, and anxiety: Secondary | ICD-10-CM

## 2021-01-25 NOTE — Patient Instructions (Addendum)
Schedule MRI brain with and without contrast  2. Schedule 1-hour EEG  3. Talk to your PCP about blood pressure  4. See your eye doctor about the left eye vision loss  5. Follow-up after tests, call for any changes

## 2021-01-25 NOTE — Progress Notes (Signed)
NEUROLOGY CONSULTATION NOTE  Kevin Soto MRN: [id number] DOB: 05/14/1961  Referring provider: Dr. Carmin Muskrat (ER) Primary care provider: Dr. Claria Dice  Reason for consult:  seizure  Dear Dr Vanita Panda:  Thank you for your kind referral of Traveon Louro [place] for consultation of the above symptoms. Although his history is well known to you, please allow me to reiterate it for the purpose of our medical record. The patient was accompanied to the clinic by his sister [name] who also provides collateral information. Records and images were personally reviewed where available.   HISTORY OF PRESENT ILLNESS: This is a pleasant 60 year old right-handed man with a history of CAD, CKD, hypertension, hyperlipidemia, stroke with residual mild left-sided ataxia, presenting for evaluation of episode of staring/unresponsiveness last 01/19/2021. He has no recollection of events, "my faculties just left," no prior warning symptoms, he mostly recalls feeling sweaty but felt fine. He denied any incontinence but smelled of urine in the ER. His sister reports incontinence for at least the past year that he is unaware of. His sister reports that after he came in to the car and put on his seatbelt, she saw him staring straight ahead,not responding. There was no clonic activity seen. She yelled at him a couple of times and then he said "I'm okay," and broke out into a cold sweat. She drove to the hospital and he got out of the car with no focal weakness. He was initially confused in the ER, unable to answer questions. He was also noted to have profuse sweating in triage. He was back to baseline on repeat exam an hour later. CBC, CMP, UA normal. I personally reviewed head CT without contrast which did not show any acute changes. There was diffuse atrophy and chronic microvascular disease worse since 2012 imaging, chronic brainstem infarct with encephalomalacia in the left cerebellum. There was a history of alcohol  use in the past but he denied any recent alcohol use. EtoH level<10. He was previously living with his wife until she passed away and he moved in with [name] a year ago. [name] denies any other staring episodes. He denies any loss of time, olfactory/gustatory hallucinations, deja vu, rising epigastric sensation, focal numbness/tingling, myoclonic jerks. He reports his left side still feels weak since the stroke. He stopped driving after the stroke over 10 years ago. [name] states that she was told by family that he had early onset dementia but she thinks his memory is pretty decent. He manages his own medications, [name] checks behind and agrees. His niece manages finances, his wife used to do it. He denies any headaches, dizziness, vision changes, bowel dysfunction. He denies any vision loss but on exam today he has decreased vision on the left eye, only seeing finger movements.   Epilepsy Risk Factors:  He had a normal birth and early development.  There is no history of febrile convulsions, CNS infections such as meningitis/encephalitis, significant traumatic brain injury, neurosurgical procedures, or family history of seizures.   PAST MEDICAL HISTORY: Past Medical History:  Diagnosis Date   Anemia    CAD (coronary artery disease)    cath 8/12:  LM ok, mLAD occluded with trivial collats from right AM and dRCA, mDx (small) 80%, RI 25%, tiny branch of OM 90%, mAM 30-40%, RCA sub-totally occluded, then 80%, EF 40%, inf HK.  He underwent PCI with [name] with placement of a Promus DES to the mRCA x 2   Chronic kidney disease  stage 3 per chart   Diabetes mellitus    Dyslipidemia    ED (erectile dysfunction)    Gait instability 02/27/2011   History of alcohol abuse    History of stomach ulcers    History of stroke    evidence of CVA on MRI in past   Hyperlipidemia    Hypertension    Ischemic cardiomyopathy    echo 7/12: EF 40-45%, septal, apical, inf basal HK, mod LVH, mild MR, mild LAE.   EF 55% by echo 2015   Macular hemorrhage    Prostate cancer ([place])    Sleep apnea    no c-pap; wife denies   Vision, loss, sudden 02/27/2011    PAST SURGICAL HISTORY: Past Surgical History:  Procedure Laterality Date   CORONARY ANGIOPLASTY WITH STENT PLACEMENT  2012   2 stents   RADIOACTIVE SEED IMPLANT N/A 05/08/2017   Procedure: RADIOACTIVE SEED IMPLANT/BRACHYTHERAPY IMPLANT;  Surgeon: Alexis Frock, MD;  Location: [facility];  Service: Urology;  Laterality: N/A;   REFRACTIVE SURGERY  2012   right and left eye/ per pt only left eye   SPACE OAR INSTILLATION N/A 05/08/2017   Procedure: SPACE OAR INSTILLATION;  Surgeon: Alexis Frock, MD;  Location: [facility];  Service: Urology;  Laterality: N/A;    MEDICATIONS: Current Outpatient Medications on File Prior to Visit  Medication Sig Dispense Refill   Accu-Chek FastClix Lancets MISC Use to check blood sugar up to 7 times a week 102 each 4   aspirin EC 81 MG tablet Take 1 tablet (81 mg total) by mouth daily. 30 tablet 11   atorvastatin (LIPITOR) 80 MG tablet Take 1 tablet (80 mg total) by mouth daily. 90 tablet 3   Blood Glucose Monitoring Suppl (ONETOUCH VERIO REFLECT) w/Device KIT Check blood sugar 1 time a day 1 kit 0   carvedilol (COREG) 25 MG tablet TAKE 1 TABLET BY MOUTH  TWICE DAILY WITH A MEAL 180 tablet 3   clopidogrel (PLAVIX) 75 MG tablet Take 1 tablet (75 mg total) by mouth daily. 90 tablet 3   dorzolamide-timolol (COSOPT) 22.3-6.8 MG/ML ophthalmic solution      Dulaglutide (TRULICITY) 1.5 JO/8.3GP SOPN INJECT SUBCUTANEOUSLY 1.5  MG INTO THE SKIN WEEKLY 2 mL 11   Empagliflozin-metFORMIN HCl (SYNJARDY) 09-998 MG TABS Take 1 tablet by mouth 2 (two) times daily. 180 tablet 3   fluticasone (FLONASE) 50 MCG/ACT nasal spray Place 2 sprays into both nostrils daily. 16 g 1   glucose blood (ONETOUCH VERIO) test strip Check blood sugar 1 time a day 100 each 3   hydrochlorothiazide (HYDRODIURIL) 25 MG  tablet Take 1 tablet (25 mg total) by mouth daily. 90 tablet 1   Lancets (ONETOUCH DELICA PLUS QDIYME15A) MISC Check blood sugar 1 time a day 100 each 3   lisinopril (ZESTRIL) 20 MG tablet Take 1 tablet (20 mg total) by mouth daily. 90 tablet 3   nitroGLYCERIN (NITROSTAT) 0.4 MG SL tablet Place 1 tablet (0.4 mg total) under the tongue every 5 (five) minutes as needed. 100 tablet 1   tamsulosin (FLOMAX) 0.4 MG CAPS capsule Take 1 capsule (0.4 mg total) by mouth daily. 30 capsule 11   No current facility-administered medications on file prior to visit.    ALLERGIES: Allergies  Allergen Reactions   Clonidine Derivatives     Dizzy, too sleepy    FAMILY HISTORY: Family History  Adopted: Yes  Problem Relation Age of Onset   Heart attack Mother  Died 61   Heart failure Mother    Diabetes Mother    Heart attack Father    Heart failure Father    Diabetes Father    Prostate cancer Father    Cancer Father        Prostate   Heart disease Sister        s/p CABG x3 in her mid 35's   Crohn's disease Sister    Crohn's disease Other        niece   Kidney disease Brother     SOCIAL HISTORY: Social History   Socioeconomic History   Marital status: Married    Spouse name: Astronomer   Number of children: 0   Years of education: Not on file   Highest education level: Not on file  Occupational History   Occupation: disabled  Tobacco Use   Smoking status: Never   Smokeless tobacco: Never  Vaping Use   Vaping Use: Never used  Substance and Sexual Activity   Alcohol use: Yes    Alcohol/week: 4.0 standard drinks    Types: 2 Cans of beer, 2 Shots of liquor per week   Drug use: No   Sexual activity: Not on file  Other Topics Concern   Not on file  Social History Narrative   [place] in [place] with his cousinNo childrenExercises 3 times a week   Right handed    Social Determinants of Health   Financial Resource Strain: Not on file  Food Insecurity: Not on file   Transportation Needs: Not on file  Physical Activity: Not on file  Stress: Not on file  Social Connections: Not on file  Intimate Partner Violence: Not on file     PHYSICAL EXAM: Vitals:   01/25/21 1245 01/25/21 1258  BP: (!) 181/108 (!) 179/124  Pulse: (!) 102   SpO2: 97%    General: No acute distress, slow to respond but appropriate responses Head:  Normocephalic/atraumatic Skin/Extremities: No rash, no edema Neurological Exam: Mental status: alert and oriented to person, place, and time, no dysarthria or aphasia, Fund of knowledge is appropriate.  Recent and remote memory are impaired.  Attention and concentration are reduced.    Able to name objects and repeat phrases. MMSE 23/30 MMSE - Mini Mental State Exam 01/25/2021  Orientation to time 4  Orientation to Place 5  Registration 3  Attention/ Calculation 3  Recall 0  Language- name 2 objects 2  Language- repeat 1  Language- follow 3 step command 3  Language- read & follow direction 1  Write a sentence 1  Copy design 0  Total score 23    Cranial nerves: CN I: not tested CN II: left pupil opaque, no response to light. He is able to see finger movements on left but unable to count fingers. Visual fields intact on right eye. CN III, IV, VI:  full range of motion, no nystagmus, no ptosis CN V: facial sensation intact CN VII: upper and lower face symmetric CN VIII: hearing intact to conversation Bulk & Tone: normal, no fasciculations. Motor: 5/5 throughout with no pronator drift. Sensation: intact to light touch, cold, pin, vibration and joint position sense.  No extinction to double simultaneous stimulation.  Romberg test negative Deep Tendon Reflexes: +2 throughout Cerebellar: slight ataxia on left finger to nose, intact on right Gait: slow, cautious, unsteady Tremor: none   IMPRESSION: This is a pleasant 60 year old right-handed man with a history of CAD, CKD, hypertension, hyperlipidemia, stroke with residual  mild  left-sided ataxia, presenting for evaluation of episode of staring/unresponsiveness last 01/19/2021. Discussed episode concerning for focal impaired awareness seizure. MRI brain with and without contrast and 1-hour EEG will be ordered. Low threshold to start seizure medication, they would like to wait until after tests are completed. He was unaware of left vision loss, advised to see his eye doctor. BP was significantly elevated at start of visit, but it did go down at end of visit, follow-up with PCP. His sister reports being told about early onset dementia, MMSE today is 23/30, symptoms suggestive of vascular cognitive impairment, continue control of vascular risk factors. He does not drive. Follow-up after tests, they know to call for any changes.   Thank you for allowing me to participate in the care of this patient. Please do not hesitate to call for any questions or concerns.   Ellouise Newer, M.D.  CC: Dr. Vanita Panda, Dr. Marva Panda

## 2021-01-30 ENCOUNTER — Other Ambulatory Visit: Payer: Medicare Other

## 2021-01-30 ENCOUNTER — Other Ambulatory Visit: Payer: Self-pay

## 2021-01-30 ENCOUNTER — Ambulatory Visit
Admission: RE | Admit: 2021-01-30 | Discharge: 2021-01-30 | Disposition: A | Discharge status: Home / Self Care | Payer: Medicare Other | Source: Ambulatory Visit | Attending: Neurology | Admitting: Neurology

## 2021-01-30 DIAGNOSIS — R404 Transient alteration of awareness: Secondary | ICD-10-CM

## 2021-01-30 DIAGNOSIS — I999 Unspecified disorder of circulatory system: Secondary | ICD-10-CM

## 2021-01-30 DIAGNOSIS — F0150 Vascular dementia without behavioral disturbance: Secondary | ICD-10-CM

## 2021-01-30 DIAGNOSIS — F01A0 Vascular dementia, mild, without behavioral disturbance, psychotic disturbance, mood disturbance, and anxiety: Secondary | ICD-10-CM

## 2021-01-30 MED ORDER — GADOBENATE DIMEGLUMINE 529 MG/ML IV SOLN
20.0000 mL | Freq: Once | INTRAVENOUS | Status: AC | PRN
  Administered 2021-01-30: 20 mL via INTRAVENOUS

## 2021-02-03 ENCOUNTER — Other Ambulatory Visit (HOSPITAL_COMMUNITY): Payer: Self-pay

## 2021-02-20 ENCOUNTER — Ambulatory Visit: Payer: Medicare Other | Admitting: Podiatry

## 2021-02-21 ENCOUNTER — Ambulatory Visit (INDEPENDENT_AMBULATORY_CARE_PROVIDER_SITE_OTHER): Payer: Medicare Other | Admitting: Podiatry

## 2021-02-21 ENCOUNTER — Encounter: Payer: Self-pay | Admitting: Podiatry

## 2021-02-21 ENCOUNTER — Other Ambulatory Visit: Payer: Self-pay

## 2021-02-21 DIAGNOSIS — D689 Coagulation defect, unspecified: Secondary | ICD-10-CM

## 2021-02-21 DIAGNOSIS — M79676 Pain in unspecified toe(s): Secondary | ICD-10-CM

## 2021-02-21 DIAGNOSIS — B351 Tinea unguium: Secondary | ICD-10-CM

## 2021-02-21 DIAGNOSIS — E119 Type 2 diabetes mellitus without complications: Secondary | ICD-10-CM

## 2021-02-21 NOTE — Progress Notes (Signed)
This patient returns to my office for at risk foot care.  This patient requires this care by a professional since this patient will be at risk due to having CKD stage 3 due to type 2 diabetes  CVA, and coagulation defect.  Patient is taking plavix.Marland Kitchen  He presents to the office with older sister due to fact he lost his wife 2022.    This patient is unable to cut nails himself since the patient cannot reach his nails.These nails are painful walking and wearing shoes.  This patient presents for at risk foot care today.  General Appearance  Alert, conversant and in no acute stress.  Vascular  Dorsalis pedis and posterior tibial  pulses are palpable  bilaterally.  Capillary return is within normal limits  bilaterally. Temperature is within normal limits  bilaterally.  Neurologic  Deferred.  Nails Thick disfigured discolored nails with subungual debris  from hallux to fifth toes bilaterally. No evidence of bacterial infection or drainage bilaterally.  Orthopedic  No limitations of motion  feet .  No crepitus or effusions noted.  No bony pathology or digital deformities noted. HAV  B/L.  Skin  normotropic skin with no porokeratosis noted bilaterally.  No signs of infections or ulcers noted.     Onychomycosis  Pain in right toes  Pain in left toes  Consent was obtained for treatment procedures.   Mechanical debridement of nails 1-5  bilaterally performed with a nail nipper.  Filed with dremel without incident.    Return office visit    prn                Told patient to return for periodic foot care and evaluation due to potential at risk complications.   Gardiner Barefoot DPM

## 2021-03-02 ENCOUNTER — Telehealth: Payer: Self-pay

## 2021-03-02 NOTE — Telephone Encounter (Signed)
MRI showed old strokes, no new changes, no tumor or bleed. Pt transferred to the front to be rescheduled for EEG

## 2021-03-02 NOTE — Telephone Encounter (Signed)
-----   Message from Cameron Sprang, MD sent at 03/01/2021 10:50 PM EDT ----- Pls let him know the brain MRI showed old strokes, no new changes, no tumor or bleed. He no-showed for EEG, pls have him reschedule. Thanks

## 2021-03-03 ENCOUNTER — Encounter: Payer: Self-pay | Admitting: *Deleted

## 2021-03-03 NOTE — Progress Notes (Unsigned)

## 2021-03-08 ENCOUNTER — Other Ambulatory Visit: Payer: Self-pay

## 2021-03-08 ENCOUNTER — Ambulatory Visit (INDEPENDENT_AMBULATORY_CARE_PROVIDER_SITE_OTHER): Payer: Medicare Other | Admitting: Neurology

## 2021-03-08 DIAGNOSIS — R404 Transient alteration of awareness: Secondary | ICD-10-CM | POA: Diagnosis not present

## 2021-03-08 DIAGNOSIS — I999 Unspecified disorder of circulatory system: Secondary | ICD-10-CM

## 2021-03-08 DIAGNOSIS — F01A0 Vascular dementia, mild, without behavioral disturbance, psychotic disturbance, mood disturbance, and anxiety: Secondary | ICD-10-CM

## 2021-03-09 ENCOUNTER — Telehealth: Payer: Self-pay | Admitting: Internal Medicine

## 2021-03-09 NOTE — Telephone Encounter (Signed)
Things That May Be Affecting Your Health:  Alcohol  Hearing loss  Pain   Y Depression Y Home Safety  Sexual Health  Y Diabetes  Lack of physical activity  Stress  Y Difficulty with daily activities  Loneliness  Tiredness   Drug use Y Medicines  Tobacco use   Falls  Motor Vehicle Safety  Weight   Food choices  Oral Health  Other    YOUR PERSONALIZED HEALTH PLAN : 1. Schedule your next subsequent Medicare Wellness visit in one year 2. Attend all of your regular appointments to address your medical issues 3. Complete the preventative screenings and services   Annual Wellness Visit   Medicare Covered Preventative Screenings and [place] Men and Women Who How Often Need? Date of Last Service Action  Abdominal Aortic Aneurysm Adults with AAA risk factors Once  @HMIMMADMINDATE ([id number])@    Alcohol Misuse and Counseling All Adults Screening once a year if no alcohol misuse. Counseling up to 4 face to face sessions.     Bone Density Measurement  Adults at risk for osteoporosis Once every 2 yrs  @HMIMMADMINDATE ([id number])@    Lipid Panel Z13.6 All adults without CV disease Once every 5 yrs  @HMIMMADMINDATE ([id number])@     Colorectal Cancer  Stool sample or Colonoscopy All adults 64 and older  Once every year Every 10 years  @[facility]IMMADMINDATE ([id number])@ @HMIMMADMINDATE ([id number])@ @HMIMMADMINDATE ([id number])@    Depression All Adults Once a year Y Today   Diabetes Screening Blood glucose, post glucose load, or GTT Z13.1 All adults at risk Pre-diabetics Once per year Twice per year  @HMIMMADMINDATE ([id number])@    Diabetes  Self-Management Training All adults Diabetics 10 hrs first year; 2 hours subsequent years. Requires Copay Y    Glaucoma Diabetics Family history of glaucoma African Americans 53 yrs + Hispanic Americans 25 yrs + Annually - requires coppay Y @HMIMMADMINDATE ([id number])@    Hepatitis C Z72.89 or F19.20 High Risk for HCV Born  between 1945 and 1965 Annually Once      HIV Z11.4 All adults based on risk Annually btw ages 68 & 54 regardless of risk Annually > 69 yrs if at increased risk Y     Lung Cancer Screening Asymptomatic adults aged 84-77 with 22 pack yr history and current smoker OR quit within the last 15 yrs Annually Must have counseling and shared decision making documentation before first screen      Medical Nutrition Therapy Adults with  Diabetes Renal disease Kidney transplant within past 3 yrs 3 hours first year; 2 hours subsequent years Y    Obesity and Counseling All adults Screening once a year Counseling if BMI 30 or higher  Today   Tobacco Use Counseling Adults who use tobacco  Up to 8 visits in one year     Vaccines Z23 Hepatitis B Influenza  Pneumonia  Adults  Once Once every flu season Two different vaccines separated by one year Y    Next Annual Wellness Visit People with Medicare Every year  Today     Services & Screenings Women Who How Often Need  Date of Last Service Action  Mammogram  Z12.31 Women over 68 One baseline ages 54-39. Annually ager 40 yrs+      Pap tests All women Annually if high risk. Every 2 yrs for normal risk women      Screening for cervical cancer with  Pap (Z01.419 nl or Z01.411abnl) & HPV Z11.51 Women aged 86 to 44 Once every 5 yrs  Screening pelvic and breast exams All women Annually if high risk. Every 2 yrs for normal risk women     Sexually Transmitted Diseases Chlamydia Gonorrhea Syphilis All at risk adults Annually for non pregnant females at increased risk         [place] Men Who How Ofter Need  Date of Last Service Action  Prostate Cancer - DRE & PSA Men over 50 Annually.  DRE might require a copay.        Sexually Transmitted Diseases Syphilis All at risk adults Annually for men at increased risk      Health Maintenance List Health Maintenance  Topic Date Due   COVID-19 Vaccine (1) Never done   Zoster  Vaccines- Shingrix (1 of 2) Never done   OPHTHALMOLOGY EXAM  03/06/2016   HEMOGLOBIN A1C  10/29/2020   INFLUENZA VACCINE  12/19/2020   TETANUS/TDAP  02/26/2021   FOOT EXAM  02/21/2022   COLONOSCOPY (Pts 45-67yrs Insurance coverage will need to be confirmed)  06/25/2022   Pneumococcal Vaccine 22-88 Years old (4 - PPSV23 if available, else PCV20) 06/23/2023   Hepatitis C Screening  Completed   HIV Screening  Completed   HPV VACCINES  Aged Out

## 2021-03-13 NOTE — Procedures (Signed)
ELECTROENCEPHALOGRAM REPORT  Date of Study: 03/08/2021  Patient's Name: Kevin Soto MRN: [id number] Date of Birth: 1960-06-09  Referring Provider: Dr. Ellouise Newer  Clinical History: This is a 60 year old man with an episode of staring/unresponsiveness in 01/2021.   Medications: aspirin EC 81 MG tablet LIPITOR 80 MG tablet COREG 25 MG tablet PLAVIX 75 MG tablet COSOPT 22.3-6.8 MG/ML ophthalmic solution TRULICITY 1.5 UY/4.0HK SOPN SYNJARDY 09-998 MG TABS FLONASE 50 MCG/ACT nasal spray HYDRODIURIL 25 MG tablet ZESTRIL 20 MG tablet NITROSTAT 0.4 MG SL tablet FLOMAX 0.4 MG CAPS capsule  Technical Summary: A multichannel digital 1-hour EEG recording measured by the international 10-20 system with electrodes applied with paste and impedances below 5000 ohms performed in our laboratory with EKG monitoring in an awake and drowsy patient.  Hyperventilation was not performed. Photic stimulation was performed.  The digital EEG was referentially recorded, reformatted, and digitally filtered in a variety of bipolar and referential montages for optimal display.    Description: The patient is awake and asleep during the recording.  During maximal wakefulness, there is a symmetric, medium voltage 9 Hz posterior dominant rhythm that attenuates with eye opening.  There is frequent focal theta and delta slowing over the bilateral temporal regions. During drowsiness, there is an increase in theta slowing of the background. Sleep was not captured. Photic stimulation did not elicit any abnormalities.  There were no epileptiform discharges or electrographic seizures seen.    EKG lead was unremarkable.  Impression: This 1-hour awake and asleep EEG is abnormal due to focal slowing over the bilateral temporal regions region.  Clinical Correlation of the above findings indicates focal cerebral dysfunction over the bilateral temporal regions region suggestive of underlying structural or physiologic  abnormality. The absence of epileptiform discharges does not exclude a clinical diagnosis of epilepsy. Clinical correlation is advised.    Ellouise Newer, M.D.

## 2021-03-20 ENCOUNTER — Ambulatory Visit (INDEPENDENT_AMBULATORY_CARE_PROVIDER_SITE_OTHER): Payer: Medicare Other | Admitting: Neurology

## 2021-03-20 ENCOUNTER — Other Ambulatory Visit: Payer: Self-pay

## 2021-03-20 ENCOUNTER — Encounter: Payer: Self-pay | Admitting: Neurology

## 2021-03-20 VITALS — BP 172/117 | HR 95 | Ht 72.0 in | Wt 219.8 lb

## 2021-03-20 DIAGNOSIS — R404 Transient alteration of awareness: Secondary | ICD-10-CM

## 2021-03-20 NOTE — Patient Instructions (Signed)
Good to see you! Follow-up in 6 months, call for any changes

## 2021-03-20 NOTE — Progress Notes (Signed)
NEUROLOGY FOLLOW UP OFFICE NOTE  Kevin Soto [id number] 1960/12/14  HISTORY OF PRESENT ILLNESS: I had the pleasure of seeing Kevin Soto in follow-up in the neurology clinic on 03/20/2021.  The patient was last seen 6 weeks ago for evaluation of an episode of staring/unresponsiveness that occurred on 01/19/21. He is accompanied by his caregiver/nephew's girlfriend Kevin Soto who helps supplement the history today. Kevin Soto lives with them and helps care for him. She denies any episodes of staring/unresponsiveness/confusion. He denies any headaches, dizziness, focal numbness/tingling/weakness, no falls. Kevin Soto reports he has been doing well. He saw the eye doctor for vision loss on the left eye.  Records and images were personally reviewed where available.  I personally reviewed MRI brain with and without contrast done 01/2021. No acute changes seen. There was mild to moderate diffuse cerebral and cerebellar atrophy with multiple chronic infarcts, advanced chronic microvascular disease, bilateral cerebellar infarcts, L>R, chronic microhemorrhages likely refelcting sequela of hypertensive microangiopathy. His EEG showed bilateral temporal slowing, no epileptiform discharges.    History on Initial Assessment 01/25/2021: This is a pleasant 60 year old right-handed man with a history of CAD, CKD, hypertension, hyperlipidemia, stroke with residual mild left-sided ataxia, presenting for evaluation of episode of staring/unresponsiveness last 01/19/2021. He has no recollection of events, "my faculties just left," no prior warning symptoms, he mostly recalls feeling sweaty but felt fine. He denied any incontinence but smelled of urine in the ER. His sister reports incontinence for at least the past year that he is unaware of. His sister reports that after he came in to the car and put on his seatbelt, she saw him staring straight ahead,not responding. There was no clonic activity seen. She yelled at him a couple of  times and then he said "I'm okay," and broke out into a cold sweat. She drove to the hospital and he got out of the car with no focal weakness. He was initially confused in the ER, unable to answer questions. He was also noted to have profuse sweating in triage. He was back to baseline on repeat exam an hour later. CBC, CMP, UA normal. I personally reviewed head CT without contrast which did not show any acute changes. There was diffuse atrophy and chronic microvascular disease worse since 2012 imaging, chronic brainstem infarct with encephalomalacia in the left cerebellum. There was a history of alcohol use in the past but he denied any recent alcohol use. EtoH level<10. He was previously living with his wife until she passed away and he moved in with Kevin Soto a year ago. Kevin Soto denies any other staring episodes. He denies any loss of time, olfactory/gustatory hallucinations, deja vu, rising epigastric sensation, focal numbness/tingling, myoclonic jerks. He reports his left side still feels weak since the stroke. He stopped driving after the stroke over 10 years ago. Kevin Soto states that she was told by family that he had early onset dementia but she thinks his memory is pretty decent. He manages his own medications, Kevin Soto checks behind and agrees. His niece manages finances, his wife used to do it. He denies any headaches, dizziness, vision changes, bowel dysfunction. He denies any vision loss but on exam today he has decreased vision on the left eye, only seeing finger movements.   Epilepsy Risk Factors:  He had a normal birth and early development.  There is no history of febrile convulsions, CNS infections such as meningitis/encephalitis, significant traumatic brain injury, neurosurgical procedures, or family history of seizures.  PAST MEDICAL HISTORY: Past Medical History:  Diagnosis Date   Anemia    CAD (coronary artery disease)    cath 8/12:  LM ok, mLAD occluded with trivial collats from right AM and  dRCA, mDx (small) 80%, RI 25%, tiny branch of OM 90%, mAM 30-40%, RCA sub-totally occluded, then 80%, EF 40%, inf HK.  He underwent PCI with [name] with placement of a Promus DES to the mRCA x 2   Chronic kidney disease    stage 3 per chart   Diabetes mellitus    Dyslipidemia    ED (erectile dysfunction)    Gait instability 02/27/2011   History of alcohol abuse    History of stomach ulcers    History of stroke    evidence of CVA on MRI in past   Hyperlipidemia    Hypertension    Ischemic cardiomyopathy    echo 7/12: EF 40-45%, septal, apical, inf basal HK, mod LVH, mild MR, mild LAE.  EF 55% by echo 2015   Macular hemorrhage    Prostate cancer ([place])    Sleep apnea    no c-pap; wife denies   Vision, loss, sudden 02/27/2011    MEDICATIONS: Current Outpatient Medications on File Prior to Visit  Medication Sig Dispense Refill   Accu-Chek FastClix Lancets MISC Use to check blood sugar up to 7 times a week 102 each 4   aspirin EC 81 MG tablet Take 1 tablet (81 mg total) by mouth daily. 30 tablet 11   atorvastatin (LIPITOR) 80 MG tablet Take 1 tablet (80 mg total) by mouth daily. 90 tablet 3   Blood Glucose Monitoring Suppl (ONETOUCH VERIO REFLECT) w/Device KIT Check blood sugar 1 time a day 1 kit 0   carvedilol (COREG) 25 MG tablet TAKE 1 TABLET BY MOUTH  TWICE DAILY WITH A MEAL 180 tablet 3   clopidogrel (PLAVIX) 75 MG tablet Take 1 tablet (75 mg total) by mouth daily. 90 tablet 3   dorzolamide-timolol (COSOPT) 22.3-6.8 MG/ML ophthalmic solution      Dulaglutide (TRULICITY) 1.5 AU/6.3FH SOPN INJECT SUBCUTANEOUSLY 1.5  MG INTO THE SKIN WEEKLY 2 mL 11   Empagliflozin-metFORMIN HCl (SYNJARDY) 09-998 MG TABS Take 1 tablet by mouth 2 (two) times daily. 180 tablet 3   fluticasone (FLONASE) 50 MCG/ACT nasal spray Place 2 sprays into both nostrils daily. 16 g 1   glucose blood (ONETOUCH VERIO) test strip Check blood sugar 1 time a day 100 each 3   hydrochlorothiazide (HYDRODIURIL) 25 MG  tablet Take 1 tablet (25 mg total) by mouth daily. 90 tablet 1   Lancets (ONETOUCH DELICA PLUS [id number]) MISC Check blood sugar 1 time a day 100 each 3   lisinopril (ZESTRIL) 20 MG tablet Take 1 tablet (20 mg total) by mouth daily. 90 tablet 3   nitroGLYCERIN (NITROSTAT) 0.4 MG SL tablet Place 1 tablet (0.4 mg total) under the tongue every 5 (five) minutes as needed. 100 tablet 1   tamsulosin (FLOMAX) 0.4 MG CAPS capsule Take 1 capsule (0.4 mg total) by mouth daily. 30 capsule 11   No current facility-administered medications on file prior to visit.    ALLERGIES: Allergies  Allergen Reactions   Clonidine Derivatives     Dizzy, too sleepy    FAMILY HISTORY: Family History  Adopted: Yes  Problem Relation Age of Onset   Heart attack Mother        Died 61   Heart failure Mother    Diabetes Mother    Heart attack Father    Heart  failure Father    Diabetes Father    Prostate cancer Father    Cancer Father        Prostate   Heart disease Sister        s/p CABG x3 in her mid 99's   Crohn's disease Sister    Crohn's disease Other        niece   Kidney disease Brother     SOCIAL HISTORY: Social History   Socioeconomic History   Marital status: Married    Spouse name: Astronomer   Number of children: 0   Years of education: Not on file   Highest education level: Not on file  Occupational History   Occupation: disabled  Tobacco Use   Smoking status: Never   Smokeless tobacco: Never  Vaping Use   Vaping Use: Never used  Substance and Sexual Activity   Alcohol use: Yes    Alcohol/week: 4.0 standard drinks    Types: 2 Cans of beer, 2 Shots of liquor per week   Drug use: No   Sexual activity: Not on file  Other Topics Concern   Not on file  Social History Narrative   [place] in [place] with his cousinNo childrenExercises 3 times a week   Right handed    Social Determinants of Health   Financial Resource Strain: Not on file  Food Insecurity: Not on file   Transportation Needs: Not on file  Physical Activity: Not on file  Stress: Not on file  Social Connections: Not on file  Intimate Partner Violence: Not on file     PHYSICAL EXAM: Vitals:   03/20/21 1324  BP: (!) 172/117  Pulse: 95  SpO2: 98%   General: No acute distress Head:  Normocephalic/atraumatic Skin/Extremities: No rash, no edema Neurological Exam: alert and awake. No aphasia or dysarthria. Fund of knowledge is reduced.  Attention and concentration are normal.   Cranial nerves: right pupil round, left pupil opaque with NLP. Extraocular movements intact with no nystagmus. Visual fields full on right eye.  No facial asymmetry.  Motor: Bulk and tone normal, muscle strength 5/5 throughout with no pronator drift.   Finger to nose testing intact.  Gait slow and cautious, slight ataxia.    IMPRESSION: This is a pleasant 60 yo RH man with a history of CAD, CKD, hypertension, hyperlipidemia, stroke with residual mild left-sided ataxia, vascular cognitive impairment, who had an episode of staring/unresponsiveness last 01/19/2021 concerning for focal impaired awareness seizure. MRI brain no acute changes, there was advanced chronic microvascular disease with chronic infarcts, EEG showed bilateral temporal slowing, no epileptiform discharges. They deny any further similar episodes since 01/19/21, we agreed to continue close monitoring for now, low threshold to start seizure medication if episodes recur. He does not drive. Follow-up in 6 months, they know to call for any changes.   Thank you for allowing me to participate in his care.  Please do not hesitate to call for any questions or concerns.    Ellouise Newer, M.D.   CC: Dr. Marva Panda

## 2021-05-24 ENCOUNTER — Ambulatory Visit: Payer: Medicare Other | Admitting: Podiatry

## 2021-05-31 LAB — HM DIABETES EYE EXAM

## 2021-06-09 ENCOUNTER — Other Ambulatory Visit: Payer: Self-pay | Admitting: Student

## 2021-06-09 DIAGNOSIS — E1159 Type 2 diabetes mellitus with other circulatory complications: Secondary | ICD-10-CM

## 2021-06-13 ENCOUNTER — Other Ambulatory Visit: Payer: Self-pay

## 2021-06-13 DIAGNOSIS — I6310 Cerebral infarction due to embolism of unspecified precerebral artery: Secondary | ICD-10-CM

## 2021-06-13 DIAGNOSIS — E1159 Type 2 diabetes mellitus with other circulatory complications: Secondary | ICD-10-CM

## 2021-06-14 MED ORDER — CARVEDILOL 25 MG PO TABS: ORAL_TABLET | ORAL | 3 refills | Status: DC

## 2021-06-14 MED ORDER — CLOPIDOGREL BISULFATE 75 MG PO TABS: 75.0000 mg | ORAL_TABLET | Freq: Every day | ORAL | 3 refills | Status: DC

## 2021-06-14 MED ORDER — TAMSULOSIN HCL 0.4 MG PO CAPS: 0.4000 mg | ORAL_CAPSULE | Freq: Every day | ORAL | 11 refills | Status: DC

## 2021-06-21 ENCOUNTER — Encounter: Payer: Self-pay | Admitting: *Deleted

## 2021-07-12 ENCOUNTER — Encounter: Payer: Medicare Other | Admitting: Internal Medicine

## 2021-07-14 ENCOUNTER — Encounter: Payer: Self-pay | Admitting: Internal Medicine

## 2021-07-14 ENCOUNTER — Ambulatory Visit (INDEPENDENT_AMBULATORY_CARE_PROVIDER_SITE_OTHER): Payer: Medicare Other | Admitting: Internal Medicine

## 2021-07-14 ENCOUNTER — Encounter: Payer: Medicare Other | Admitting: Internal Medicine

## 2021-07-14 VITALS — BP 169/91 | HR 103 | Temp 98.9°F | Ht 72.0 in | Wt 220.4 lb

## 2021-07-14 DIAGNOSIS — E785 Hyperlipidemia, unspecified: Secondary | ICD-10-CM

## 2021-07-14 DIAGNOSIS — Z23 Encounter for immunization: Secondary | ICD-10-CM

## 2021-07-14 DIAGNOSIS — E1149 Type 2 diabetes mellitus with other diabetic neurological complication: Secondary | ICD-10-CM

## 2021-07-14 DIAGNOSIS — E1159 Type 2 diabetes mellitus with other circulatory complications: Secondary | ICD-10-CM | POA: Diagnosis not present

## 2021-07-14 DIAGNOSIS — I152 Hypertension secondary to endocrine disorders: Secondary | ICD-10-CM

## 2021-07-14 DIAGNOSIS — E1169 Type 2 diabetes mellitus with other specified complication: Secondary | ICD-10-CM

## 2021-07-14 DIAGNOSIS — Z0000 Encounter for general adult medical examination without abnormal findings: Secondary | ICD-10-CM

## 2021-07-14 DIAGNOSIS — I2510 Atherosclerotic heart disease of native coronary artery without angina pectoris: Secondary | ICD-10-CM

## 2021-07-14 LAB — POCT GLYCOSYLATED HEMOGLOBIN (HGB A1C): Hemoglobin A1C: 7.2 % — AB (ref 4.0–5.6)

## 2021-07-14 LAB — GLUCOSE, CAPILLARY: Glucose-Capillary: 201 mg/dL — ABNORMAL HIGH (ref 70–99)

## 2021-07-14 NOTE — Progress Notes (Signed)
° °  CC: hypertension and diabetes follow up  HPI:  Mr.Cato E Sitzmann is a 61 y.o. male with PMHx as stated below presenting for follow up of hypertension and diabetes. He is accompanied by his sister who helps with his medications. Please see problem based charting for complete assessment and plan.  Past Medical History:  Diagnosis Date   Anemia    CAD (coronary artery disease)    cath 8/12:  LM ok, mLAD occluded with trivial collats from right AM and dRCA, mDx (small) 80%, RI 25%, tiny branch of OM 90%, mAM 30-40%, RCA sub-totally occluded, then 80%, EF 40%, inf HK.  He underwent PCI with [name] with placement of a Promus DES to the mRCA x 2   Chronic kidney disease    stage 3 per chart   Diabetes mellitus    Dyslipidemia    ED (erectile dysfunction)    Gait instability 02/27/2011   History of alcohol abuse    History of stomach ulcers    History of stroke    evidence of CVA on MRI in past   Hyperlipidemia    Hypertension    Ischemic cardiomyopathy    echo 7/12: EF 40-45%, septal, apical, inf basal HK, mod LVH, mild MR, mild LAE.  EF 55% by echo 2015   Macular hemorrhage    Prostate cancer ([place])    Sleep apnea    no c-pap; wife denies   Vision, loss, sudden 02/27/2011   Review of Systems:  Negative except as stated in HPI.  Physical Exam:  Vitals:   07/14/21 0936  BP: (!) 155/89  Pulse: (!) 103  Temp: 98.9 F (37.2 C)  TempSrc: Oral  SpO2: 100%  Weight: 220 lb 6.4 oz (100 kg)  Height: 6' (1.829 m)   Physical Exam  Constitutional: Appears well-developed and well-nourished. No distress.  Cardiovascular: Normal rate, regular rhythm, S1 and S2 present, no murmurs, rubs, gallops.  Distal pulses intact Respiratory: No respiratory distress,  Lungs are clear to auscultation bilaterally. Musculoskeletal: Normal bulk and tone.  No peripheral edema noted. Neurological: Is alert and oriented x4, no apparent focal deficits noted. Skin: Warm and dry.  No rash, erythema,  lesions noted. Psychiatric: Normal mood and affect.   Assessment & Plan:   See Encounters Tab for problem based charting.  Patient discussed with Dr. Jimmye Norman

## 2021-07-14 NOTE — Patient Instructions (Addendum)
Mr Kevin Soto,  It was a pleasure seeing you in clinic. Today we discussed:   Blood pressure: Continue to take all blood pressure medications as prescribed. Continue to adhere to low-sodium diet. Monitor your blood pressure daily at home and bring a blood pressure log to your next visit in 2 weeks.   Diabetes:  Your A1c is 7.2 today. Continue taking your Synjardy and Trulicity as prescribed. Try to cut back on any sugary drinks or foods.   You received your tetanus shot today. Please get your shingles vaccine from your local pharmacy.   If you have any questions or concerns, please call our clinic at [phone] between 9am-5pm and after hours call [phone] and ask for the internal medicine resident on call. If you feel you are having a medical emergency please call 911.   Thank you, we look forward to helping you remain healthy!

## 2021-07-15 LAB — LIPID PANEL
Chol/HDL Ratio: 3.3 ratio (ref 0.0–5.0)
Cholesterol, Total: 128 mg/dL (ref 100–199)
HDL: 39 mg/dL — ABNORMAL LOW (ref >39)
LDL Chol Calc (NIH): 69 mg/dL (ref 0–99)
Triglycerides: 110 mg/dL (ref 0–149)
VLDL Cholesterol Cal: 20 mg/dL (ref 5–40)

## 2021-07-15 LAB — BMP8+ANION GAP
Anion Gap: 13 mmol/L (ref 10.0–18.0)
BUN/Creatinine Ratio: 10 (ref 10–24)
BUN: 17 mg/dL (ref 8–27)
CO2: 26 mmol/L (ref 20–29)
Calcium: 10 mg/dL (ref 8.6–10.2)
Chloride: 100 mmol/L (ref 96–106)
Creatinine, Ser: 1.76 mg/dL — ABNORMAL HIGH (ref 0.76–1.27)
Glucose: 165 mg/dL — ABNORMAL HIGH (ref 70–99)
Potassium: 4.1 mmol/L (ref 3.5–5.2)
Sodium: 139 mmol/L (ref 134–144)
eGFR: 43 mL/min/{1.73_m2} — ABNORMAL LOW (ref >59)

## 2021-07-16 NOTE — Assessment & Plan Note (Signed)
Tdap vaccine given at this visit

## 2021-07-16 NOTE — Assessment & Plan Note (Signed)
BP Readings from Last 3 Encounters:  07/14/21 (!) 169/91  03/20/21 (!) 172/117  01/25/21 (!) 148/89   Patient is presenting for hypertension follow up. He was previously normotensive on his regimen of lisinopril, HCTZ, carvedilol; however, most recent visits have noted to have elevated blood pressures. He is asymptomatic at this time and his sister, who is responsible for his medications, endorses medication compliance.  Denies any dietary indiscretions  BMP today with stable renal function and no electrolyte abnormalities noted.  Plan: Continue current regimen Patient advised to keep BP log at home and bring to next visit Follow up in 2 weeks for BP check and medication adjustment as necessary

## 2021-07-16 NOTE — Assessment & Plan Note (Signed)
HbA1c 7.2 at this visit. Patient is on Synjardy twice daily and Trulicity 1.5mg  weekly. He reports medication adherence and denies any complications at this time. Has not been able to check blood glucose at home. He is not currently eligible for CGM as he is not on insulin.  Plan: Continue current regimen of Synjardy twice daily and Trulicity weekly  Repeat A1c in 3 months

## 2021-07-16 NOTE — Assessment & Plan Note (Signed)
Lipid Panel     Component Value Date/Time   CHOL 128 07/14/2021 1033   TRIG 110 07/14/2021 1033   HDL 39 (L) 07/14/2021 1033   CHOLHDL 3.3 07/14/2021 1033   CHOLHDL 3.3 08/17/2014 1532   VLDL 17 08/17/2014 1532   LDLCALC 69 07/14/2021 1033   LABVLDL 20 07/14/2021 1033   Plan: Continue atorvastatin 80mg  daily

## 2021-07-26 ENCOUNTER — Encounter: Payer: Self-pay | Admitting: *Deleted

## 2021-07-26 NOTE — Progress Notes (Signed)
Internal Medicine Clinic Attending ° °Case discussed with Dr. Aslam  At the time of the visit.  We reviewed the resident’s history and exam and pertinent patient test results.  I agree with the assessment, diagnosis, and plan of care documented in the resident’s note.  °

## 2021-07-28 ENCOUNTER — Encounter: Payer: Medicare Other | Admitting: Internal Medicine

## 2021-08-11 ENCOUNTER — Ambulatory Visit (INDEPENDENT_AMBULATORY_CARE_PROVIDER_SITE_OTHER): Payer: Medicare Other | Admitting: Student

## 2021-08-11 ENCOUNTER — Telehealth: Payer: Self-pay | Admitting: *Deleted

## 2021-08-11 DIAGNOSIS — E1122 Type 2 diabetes mellitus with diabetic chronic kidney disease: Secondary | ICD-10-CM

## 2021-08-11 DIAGNOSIS — I152 Hypertension secondary to endocrine disorders: Secondary | ICD-10-CM

## 2021-08-11 DIAGNOSIS — N183 Chronic kidney disease, stage 3 unspecified: Secondary | ICD-10-CM

## 2021-08-11 DIAGNOSIS — E1159 Type 2 diabetes mellitus with other circulatory complications: Secondary | ICD-10-CM | POA: Diagnosis not present

## 2021-08-11 DIAGNOSIS — E118 Type 2 diabetes mellitus with unspecified complications: Secondary | ICD-10-CM

## 2021-08-11 DIAGNOSIS — Z0000 Encounter for general adult medical examination without abnormal findings: Secondary | ICD-10-CM

## 2021-08-11 MED ORDER — ZOSTER VAC RECOMB ADJUVANTED 50 MCG/0.5ML IM SUSR: 0.5000 mL | Freq: Once | INTRAMUSCULAR | 0 refills | Status: AC

## 2021-08-11 NOTE — Assessment & Plan Note (Signed)
Assessment: ?Last A1c of 7.2 one month ago. Doing well on current regimen of synjardy and trulicity. Encouraged him to continue to eat a healthy diet. He will follow up in 2-3 months for an A1c recheck.    ? ?Plan: ?-Continue synjardy 01-3551 mg BID and trulicity 1.5 mg weekly ?-follow up A1c in 2-3 months ?

## 2021-08-11 NOTE — Assessment & Plan Note (Signed)
Given prescription for shingrix. He has seen his ophthalmologist recently, will ask nursing staff to assist in getting most recent visit notes. He has had multiple covid shots, he will bring in his vaccine card at his next visit so we are able to update it in our system.  ?

## 2021-08-11 NOTE — Assessment & Plan Note (Signed)
CKD stg 3b, stable last BMP on 06/2021.  ?

## 2021-08-11 NOTE — Telephone Encounter (Signed)
Call to Dr Baird [facility] office ?Request for last eye exam and all future exams to be faxed to [facility]  ?Office able to comply with request ?No further action needed   ?Phone call complete ?

## 2021-08-11 NOTE — Patient Instructions (Signed)
Thank you, [name] for allowing [place] to provide your care today. Today we discussed . ? ?Blood Pressure ?Your blood pressure looks great today. Please continue to check your blood pressure throughout the week (2-3 times) and record these. Please make sure to take your medicine daily ? ?Preventative Health ?Please bring in your covid vaccine paperwork to your next appointment. Please take the prescription I have written for the shingles shot and have this done at your local pharmacy.    ? ?I have ordered the following labs for you: ? ?Lab Orders  ?No laboratory test(s) ordered today  ?  ? ?Referrals ordered today:  ? ?Referral Orders  ?No referral(s) requested today  ?  ? ?I have ordered the following medication/changed the following medications:  ? ?Stop the following medications: ?There are no discontinued medications.  ? ?Start the following medications: ?Meds ordered this encounter  ?Medications  ? Zoster Vaccine Adjuvanted [facility]) injection  ?  Sig: Inject 0.5 mLs into the muscle once for 1 dose.  ?  Dispense:  0.5 mL  ?  Refill:  0  ?  ? ?Follow up: 3 months  ? ? ?Should you have any questions or concerns please call the internal medicine clinic at [phone].   ? ?Sanjuana Letters, D.O. ?[place] ?  ?

## 2021-08-11 NOTE — Assessment & Plan Note (Addendum)
Assessment: ?BP of 126/78. Current regimen of carvedilol 25 mg, lisinopril 20 mg, and HCTZ 25 mg daily. He says he is taking his medications daily. He brought in blood pressure readings to review, he takes his blood pressure in the morning prior to taking his medications. There is some variability in his pressures ranging from 188/100 to 126/78. Denies episodes of lightheadedness or dizziness upon standing or with walking. He cannot tell when his blood pressures are high or low.  ? ?Readdressed importance of adherence. He has not seen a pattern that would explain the variability in his pressure. Recommended continue home monitoring and bring in readings to next visit. Will continue on current regimen above. If continues to have variable blood pressure readings, can consider less common causes such as pheochromocytoma.   ? ?Plan: ?-continue regimen carvedilol 25 mg, lisinopril 20 mg, and HCTZ 25 mg.  ?-follow up BP in 2-3 months with diabetes revaluation ?-discussed calling clinic if he does have episodes of lightheadedness or dizziness ?

## 2021-08-11 NOTE — Progress Notes (Signed)
? ?CC: follow up hypertension ? ?HPI: ? ?Kevin Soto is a 61 y.o. male living with a history stated below and presents today for follow up for his hypertension. Please see problem based assessment and plan for additional details. ? ?Past Medical History:  ?Diagnosis Date  ? Anemia   ? CAD (coronary artery disease)   ? cath 8/12:  LM ok, mLAD occluded with trivial collats from right AM and dRCA, mDx (small) 80%, RI 25%, tiny branch of OM 90%, mAM 30-40%, RCA sub-totally occluded, then 80%, EF 40%, inf HK.  He underwent PCI with [name] with placement of a Promus DES to the mRCA x 2  ? Chronic kidney disease   ? stage 3 per chart  ? Diabetes mellitus   ? Dyslipidemia   ? ED (erectile dysfunction)   ? Gait instability 02/27/2011  ? History of alcohol abuse   ? History of stomach ulcers   ? History of stroke   ? evidence of CVA on MRI in past  ? Hyperlipidemia   ? Hypertension   ? Ischemic cardiomyopathy   ? echo 7/12: EF 40-45%, septal, apical, inf basal HK, mod LVH, mild MR, mild LAE.  EF 55% by echo 2015  ? Macular hemorrhage   ? Prostate cancer ([place])   ? Sleep apnea   ? no c-pap; wife denies  ? Vision, loss, sudden 02/27/2011  ? ? ?Current Outpatient Medications on File Prior to Visit  ?Medication Sig Dispense Refill  ? Accu-Chek FastClix Lancets MISC Use to check blood sugar up to 7 times a week 102 each 4  ? aspirin EC 81 MG tablet Take 1 tablet (81 mg total) by mouth daily. 30 tablet 11  ? atorvastatin (LIPITOR) 80 MG tablet Take 1 tablet (80 mg total) by mouth daily. 90 tablet 3  ? Blood Glucose Monitoring Suppl (ONETOUCH VERIO REFLECT) w/Device KIT Check blood sugar 1 time a day 1 kit 0  ? carvedilol (COREG) 25 MG tablet TAKE 1 TABLET BY MOUTH  TWICE DAILY WITH A MEAL 180 tablet 3  ? clopidogrel (PLAVIX) 75 MG tablet Take 1 tablet (75 mg total) by mouth daily. 90 tablet 3  ? dorzolamide-timolol (COSOPT) 22.3-6.8 MG/ML ophthalmic solution     ? Dulaglutide (TRULICITY) 1.5 BR/8.3EN SOPN INJECT  SUBCUTANEOUSLY 1.5  MG INTO THE SKIN WEEKLY 2 mL 11  ? Empagliflozin-metFORMIN HCl (SYNJARDY) 09-998 MG TABS Take 1 tablet by mouth 2 (two) times daily. 180 tablet 3  ? fluticasone (FLONASE) 50 MCG/ACT nasal spray Place 2 sprays into both nostrils daily. 16 g 1  ? glucose blood (ONETOUCH VERIO) test strip Check blood sugar 1 time a day 100 each 3  ? hydrochlorothiazide (HYDRODIURIL) 25 MG tablet TAKE 1 TABLET BY MOUTH  DAILY 90 tablet 3  ? Lancets (ONETOUCH DELICA PLUS [id number]) MISC Check blood sugar 1 time a day 100 each 3  ? lisinopril (ZESTRIL) 20 MG tablet Take 1 tablet (20 mg total) by mouth daily. 90 tablet 3  ? nitroGLYCERIN (NITROSTAT) 0.4 MG SL tablet Place 1 tablet (0.4 mg total) under the tongue every 5 (five) minutes as needed. 100 tablet 1  ? tamsulosin (FLOMAX) 0.4 MG CAPS capsule Take 1 capsule (0.4 mg total) by mouth daily. 30 capsule 11  ? ?No current facility-administered medications on file prior to visit.  ? ? ?Family History  ?Adopted: Yes  ?Problem Relation Age of Onset  ? Heart attack Mother   ?     Died  56  ? Heart failure Mother   ? Diabetes Mother   ? Heart attack Father   ? Heart failure Father   ? Diabetes Father   ? Prostate cancer Father   ? Cancer Father   ?     Prostate  ? Heart disease Sister   ?     s/p CABG x3 in her mid 27's  ? Crohn's disease Sister   ? Crohn's disease Other   ?     niece  ? Kidney disease Brother   ? ? ?Social History  ? ?Socioeconomic History  ? Marital status: Married  ?  Spouse name: Aleta  ? Number of children: 0  ? Years of education: Not on file  ? Highest education level: Not on file  ?Occupational History  ? Occupation: disabled  ?Tobacco Use  ? Smoking status: Never  ? Smokeless tobacco: Never  ?Vaping Use  ? Vaping Use: Never used  ?Substance and Sexual Activity  ? Alcohol use: Yes  ?  Alcohol/week: 4.0 standard drinks  ?  Types: 2 Cans of beer, 2 Shots of liquor per week  ? Drug use: No  ? Sexual activity: Not on file  ?Other Topics Concern  ? Not  on file  ?Social History Narrative  ? Lives in [place] with his cousinNo childrenExercises 3 times a week  ? Right handed   ? ?Social Determinants of Health  ? ?Financial Resource Strain: Not on file  ?Food Insecurity: Not on file  ?Transportation Needs: Not on file  ?Physical Activity: Not on file  ?Stress: Not on file  ?Social Connections: Not on file  ?Intimate Partner Violence: Not on file  ? ? ?Review of Systems: ?ROS negative except for what is noted on the assessment and plan. ? ?Vitals:  ? 08/11/21 1007  ?BP: 126/78  ?Pulse: (!) 102  ?Temp: 98.7 ?F (37.1 ?C)  ?TempSrc: Oral  ?SpO2: 100%  ?Weight: 215 lb 4.8 oz (97.7 kg)  ? ? ?Physical Exam: ?Constitutional: well appearing, no acute distress ?Neck: supple ?Cardiovascular: regular rate and rhythm, no m/r/g ?Pulmonary/Chest: normal work of breathing on room air, lungs clear to auscultation bilaterally ?MSK: normal bulk and tone ?Neurological: alert & oriented x 3 ?Skin: warm and dry ?Psych: normal mood and thought process ? ?Assessment & Plan:  ? ?Hypertension associated with diabetes ([place]) ?Assessment: ?BP of 126/78. Current regimen of carvedilol 25 mg, lisinopril 20 mg, and HCTZ 25 mg daily. He says he is taking his medications daily. He brought in blood pressure readings to review, he takes his blood pressure in the morning prior to taking his medications. There is some variability in his pressures ranging from 188/100 to 126/78. Denies episodes of lightheadedness or dizziness upon standing or with walking. He cannot tell when his blood pressures are high or low.  ? ?Readdressed importance of adherence. He has not seen a pattern that would explain the variability in his pressure. Recommended continue home monitoring and bring in readings to next visit. Will continue on current regimen above.  ? ?Plan: ?-continue regimen carvedilol 25 mg, lisinopril 20 mg, and HCTZ 25 mg.  ?-follow up BP in 2-3 months with diabetes revaluation ?-discussed calling clinic if  he does have episodes of lightheadedness or dizziness ? ?Preventative health care ?Given prescription for shingrix. He has seen his ophthalmologist recently, will ask nursing staff to assist in getting most recent visit notes. He has had multiple covid shots, he will bring in his vaccine card at his  next visit so we are able to update it in our system.  ? ?Type 2 diabetes mellitus with complication, without long-term current use of insulin ([place]) ?Assessment: ?Last A1c of 7.2 one month ago. Doing well on current regimen of synjardy and trulicity. Encouraged him to continue to eat a healthy diet. He will follow up in 2-3 months for an A1c recheck.    ? ?Plan: ?-Continue synjardy 07-8880 mg BID and trulicity 1.5 mg weekly ?-follow up A1c in 2-3 months ? ?CKD stage 3 due to type 2 diabetes mellitus ([place]) ?CKD stg 3b, stable last BMP on 06/2021.  ? ?Patient discussed with Dr. Evette Doffing ? ?Sanjuana Letters, D.O. ?[facility], PGY-2 ?Pager: [phone], Phone: [phone] ?Date 08/11/2021 Time 10:48 AM  ?

## 2021-08-14 NOTE — Progress Notes (Signed)
Internal Medicine Clinic Attending  Case discussed with Dr. Katsadouros  At the time of the visit.  We reviewed the resident's history and exam and pertinent patient test results.  I agree with the assessment, diagnosis, and plan of care documented in the resident's note.  

## 2021-08-21 ENCOUNTER — Ambulatory Visit: Payer: Medicare Other | Admitting: Neurology

## 2021-08-21 ENCOUNTER — Encounter: Payer: Self-pay | Admitting: Neurology

## 2021-08-21 DIAGNOSIS — Z029 Encounter for administrative examinations, unspecified: Secondary | ICD-10-CM

## 2021-08-23 ENCOUNTER — Encounter: Payer: Self-pay | Admitting: Dietician

## 2021-09-01 ENCOUNTER — Encounter: Payer: Medicare Other | Admitting: Internal Medicine

## 2021-09-07 ENCOUNTER — Ambulatory Visit (INDEPENDENT_AMBULATORY_CARE_PROVIDER_SITE_OTHER): Payer: Medicare Other | Admitting: Internal Medicine

## 2021-09-07 VITALS — BP 130/70 | HR 100 | Temp 98.3°F | Wt 208.9 lb

## 2021-09-07 DIAGNOSIS — I6310 Cerebral infarction due to embolism of unspecified precerebral artery: Secondary | ICD-10-CM | POA: Diagnosis not present

## 2021-09-07 DIAGNOSIS — R2681 Unsteadiness on feet: Secondary | ICD-10-CM

## 2021-09-07 NOTE — Assessment & Plan Note (Signed)
Patient had a CVA in 2017 and has had difficulties with ambulation since then. 1 year ago the patient was seen in our clinic for having an unsteady gait while ambulating and was referred to physical therapy at that time, however this was never completed, as the patient did not know that he was supposed to see physical therapy.  The patient presented with his sister today for other evaluation of his unsteady gait. Although this issue has been chronic over a few years, she reports that over the last month or so, she feels like he has been shuffling more and has been more unsteady. Patient denies any dizziness, lightheadedness, falls, weakness, or any acute changes. Neuro exam was unremarkable today. ? ?Plan: ?- Referral to PT ?- DME order for walker and cane  ?

## 2021-09-07 NOTE — Progress Notes (Signed)
? ?  CC: difficulty walking ? ?HPI: ? ?[name] is a 61 y.o. male with hypertension, diabetes, CAD, hyperlipidemia, and CKD3 who presents to the [facility] for difficulty walking. Please see problem-based list for further details, assessments, and plans. ? ? ?Past Medical History:  ?Diagnosis Date  ? Anemia   ? CAD (coronary artery disease)   ? cath 8/12:  LM ok, mLAD occluded with trivial collats from right AM and dRCA, mDx (small) 80%, RI 25%, tiny branch of OM 90%, mAM 30-40%, RCA sub-totally occluded, then 80%, EF 40%, inf HK.  He underwent PCI with [name] with placement of a Promus DES to the mRCA x 2  ? Chronic kidney disease   ? stage 3 per chart  ? Diabetes mellitus   ? Dyslipidemia   ? ED (erectile dysfunction)   ? Gait instability 02/27/2011  ? History of alcohol abuse   ? History of stomach ulcers   ? History of stroke   ? evidence of CVA on MRI in past  ? Hyperlipidemia   ? Hypertension   ? Ischemic cardiomyopathy   ? echo 7/12: EF 40-45%, septal, apical, inf basal HK, mod LVH, mild MR, mild LAE.  EF 55% by echo 2015  ? Macular hemorrhage   ? Prostate cancer ([place])   ? Sleep apnea   ? no c-pap; wife denies  ? Vision, loss, sudden 02/27/2011  ? ?Review of Systems:  Review of Systems  ?Constitutional:  Negative for chills and fever.  ?HENT: Negative.    ?Respiratory: Negative.    ?Cardiovascular:  Negative for chest pain and palpitations.  ?Gastrointestinal:  Negative for diarrhea, nausea and vomiting.  ?Genitourinary: Negative.   ?Neurological:  Negative for dizziness, sensory change, loss of consciousness, weakness and headaches.  ?     Off balance  ? ? ?Physical Exam: ? ?Vitals:  ? 09/07/21 0958  ?BP: 130/70  ?Pulse: 100  ?Temp: 98.3 ?F (36.8 ?C)  ?TempSrc: Oral  ?SpO2: 99%  ?Weight: 208 lb 14.4 oz (94.8 kg)  ? ?General: Pleasant, well-appearing male in wheelchair. No acute distress. ?CV: RRR. No murmurs, rubs, or gallops.  ?Pulmonary: Lungs CTAB. Normal effort.  ?Extremities: Normal bulk and  tone.  ?Skin: Warm and dry. ?Neuro: A&Ox3. CN II-XII intact, aside from left pupil nonreactive to light (chronic). 5/5 strength bilateral upper and lower extremities. No focal deficit. ?Psych: Normal mood and affect ? ? ?Assessment & Plan:  ? ?See Encounters Tab for problem based charting. ? ?Patient discussed with Dr. Dareen Piano ? ?Unsteady gait when walking ?Patient had a CVA in 2017 and has had difficulties with ambulation since then. 1 year ago the patient was seen in our clinic for having an unsteady gait while ambulating and was referred to physical therapy at that time, however this was never completed, as the patient did not know that he was supposed to see physical therapy.  The patient presented with his sister today for other evaluation of his unsteady gait. Although this issue has been chronic over a few years, she reports that over the last month or so, she feels like he has been shuffling more and has been more unsteady. Patient denies any dizziness, lightheadedness, falls, weakness, or any acute changes. Neuro exam was unremarkable today. ? ?Plan: ?- Referral to PT ?- DME order for walker and cane  ? ? ? ?

## 2021-09-07 NOTE — Patient Instructions (Signed)
Thank you, Mr.Tamer E Shough for allowing [place] to provide your care today. Today we discussed: ? ?Difficulty walking: We are referring you to physical therapy today to help build up your strength. Also placed an order for you to get a cane and a walker ? ?I have ordered the following labs for you: ? ?Lab Orders  ?No laboratory test(s) ordered today  ?  ? ? ?Referrals ordered today:  ? ?Referral Orders  ?No referral(s) requested today  ?  ? ?I have ordered the following medication/changed the following medications:  ? ?Stop the following medications: ?There are no discontinued medications.  ? ?Start the following medications: ?No orders of the defined types were placed in this encounter. ?  ? ?Follow up:  1 month for diabetes   ? ? ?Remember: to start physical therapy ? ?Should you have any questions or concerns please call the internal medicine clinic at [phone].   ? ? ?Buddy Duty, D.O. ?[place] ? ? ?

## 2021-09-08 NOTE — Progress Notes (Signed)
Internal Medicine Clinic Attending ° °Case discussed with Dr. Atway  At the time of the visit.  We reviewed the resident’s history and exam and pertinent patient test results.  I agree with the assessment, diagnosis, and plan of care documented in the resident’s note.  °

## 2021-09-27 ENCOUNTER — Encounter (HOSPITAL_COMMUNITY): Payer: Self-pay | Admitting: *Deleted

## 2021-09-27 ENCOUNTER — Emergency Department (HOSPITAL_COMMUNITY): Payer: Medicare Other

## 2021-09-27 ENCOUNTER — Telehealth: Payer: Self-pay

## 2021-09-27 ENCOUNTER — Ambulatory Visit: Payer: Medicare Other | Attending: Internal Medicine

## 2021-09-27 ENCOUNTER — Other Ambulatory Visit: Payer: Self-pay

## 2021-09-27 ENCOUNTER — Observation Stay (HOSPITAL_COMMUNITY): Payer: Medicare Other

## 2021-09-27 ENCOUNTER — Inpatient Hospital Stay (HOSPITAL_COMMUNITY)
Admission: EM | Admit: 2021-09-27 | Discharge: 2021-10-05 | DRG: 674 | Disposition: A | Discharge status: Home Health | Payer: Medicare Other | Source: Ambulatory Visit | Attending: Internal Medicine | Admitting: Internal Medicine

## 2021-09-27 DIAGNOSIS — Y92003 Bedroom of unspecified non-institutional (private) residence as the place of occurrence of the external cause: Secondary | ICD-10-CM

## 2021-09-27 DIAGNOSIS — I6310 Cerebral infarction due to embolism of unspecified precerebral artery: Secondary | ICD-10-CM | POA: Insufficient documentation

## 2021-09-27 DIAGNOSIS — K2980 Duodenitis without bleeding: Secondary | ICD-10-CM | POA: Diagnosis present

## 2021-09-27 DIAGNOSIS — Z888 Allergy status to other drugs, medicaments and biological substances status: Secondary | ICD-10-CM

## 2021-09-27 DIAGNOSIS — N170 Acute kidney failure with tubular necrosis: Principal | ICD-10-CM | POA: Diagnosis present

## 2021-09-27 DIAGNOSIS — Z7985 Long-term (current) use of injectable non-insulin antidiabetic drugs: Secondary | ICD-10-CM

## 2021-09-27 DIAGNOSIS — K222 Esophageal obstruction: Secondary | ICD-10-CM | POA: Diagnosis present

## 2021-09-27 DIAGNOSIS — D631 Anemia in chronic kidney disease: Secondary | ICD-10-CM | POA: Diagnosis present

## 2021-09-27 DIAGNOSIS — Z8673 Personal history of transient ischemic attack (TIA), and cerebral infarction without residual deficits: Secondary | ICD-10-CM

## 2021-09-27 DIAGNOSIS — Z8249 Family history of ischemic heart disease and other diseases of the circulatory system: Secondary | ICD-10-CM

## 2021-09-27 DIAGNOSIS — R34 Anuria and oliguria: Secondary | ICD-10-CM | POA: Diagnosis not present

## 2021-09-27 DIAGNOSIS — N19 Unspecified kidney failure: Secondary | ICD-10-CM

## 2021-09-27 DIAGNOSIS — N186 End stage renal disease: Secondary | ICD-10-CM | POA: Diagnosis not present

## 2021-09-27 DIAGNOSIS — R319 Hematuria, unspecified: Secondary | ICD-10-CM | POA: Diagnosis present

## 2021-09-27 DIAGNOSIS — Z7982 Long term (current) use of aspirin: Secondary | ICD-10-CM

## 2021-09-27 DIAGNOSIS — K449 Diaphragmatic hernia without obstruction or gangrene: Secondary | ICD-10-CM | POA: Diagnosis present

## 2021-09-27 DIAGNOSIS — H3530 Unspecified macular degeneration: Secondary | ICD-10-CM | POA: Diagnosis present

## 2021-09-27 DIAGNOSIS — K2970 Gastritis, unspecified, without bleeding: Secondary | ICD-10-CM | POA: Diagnosis present

## 2021-09-27 DIAGNOSIS — E860 Dehydration: Secondary | ICD-10-CM | POA: Diagnosis present

## 2021-09-27 DIAGNOSIS — E876 Hypokalemia: Secondary | ICD-10-CM | POA: Diagnosis not present

## 2021-09-27 DIAGNOSIS — Z7902 Long term (current) use of antithrombotics/antiplatelets: Secondary | ICD-10-CM

## 2021-09-27 DIAGNOSIS — F1010 Alcohol abuse, uncomplicated: Secondary | ICD-10-CM | POA: Diagnosis present

## 2021-09-27 DIAGNOSIS — I255 Ischemic cardiomyopathy: Secondary | ICD-10-CM | POA: Diagnosis present

## 2021-09-27 DIAGNOSIS — G4730 Sleep apnea, unspecified: Secondary | ICD-10-CM | POA: Diagnosis present

## 2021-09-27 DIAGNOSIS — H5462 Unqualified visual loss, left eye, normal vision right eye: Secondary | ICD-10-CM | POA: Diagnosis present

## 2021-09-27 DIAGNOSIS — K2100 Gastro-esophageal reflux disease with esophagitis, without bleeding: Secondary | ICD-10-CM | POA: Diagnosis present

## 2021-09-27 DIAGNOSIS — R2689 Other abnormalities of gait and mobility: Secondary | ICD-10-CM | POA: Insufficient documentation

## 2021-09-27 DIAGNOSIS — Z833 Family history of diabetes mellitus: Secondary | ICD-10-CM

## 2021-09-27 DIAGNOSIS — K920 Hematemesis: Secondary | ICD-10-CM | POA: Diagnosis not present

## 2021-09-27 DIAGNOSIS — Z7984 Long term (current) use of oral hypoglycemic drugs: Secondary | ICD-10-CM

## 2021-09-27 DIAGNOSIS — Z79899 Other long term (current) drug therapy: Secondary | ICD-10-CM

## 2021-09-27 DIAGNOSIS — N179 Acute kidney failure, unspecified: Principal | ICD-10-CM

## 2021-09-27 DIAGNOSIS — E872 Acidosis, unspecified: Secondary | ICD-10-CM | POA: Diagnosis present

## 2021-09-27 DIAGNOSIS — I2510 Atherosclerotic heart disease of native coronary artery without angina pectoris: Secondary | ICD-10-CM | POA: Diagnosis present

## 2021-09-27 DIAGNOSIS — I120 Hypertensive chronic kidney disease with stage 5 chronic kidney disease or end stage renal disease: Secondary | ICD-10-CM | POA: Diagnosis not present

## 2021-09-27 DIAGNOSIS — E1122 Type 2 diabetes mellitus with diabetic chronic kidney disease: Secondary | ICD-10-CM | POA: Diagnosis present

## 2021-09-27 DIAGNOSIS — E785 Hyperlipidemia, unspecified: Secondary | ICD-10-CM | POA: Diagnosis present

## 2021-09-27 DIAGNOSIS — W06XXXA Fall from bed, initial encounter: Secondary | ICD-10-CM | POA: Diagnosis present

## 2021-09-27 DIAGNOSIS — R2681 Unsteadiness on feet: Secondary | ICD-10-CM | POA: Insufficient documentation

## 2021-09-27 DIAGNOSIS — Z955 Presence of coronary angioplasty implant and graft: Secondary | ICD-10-CM

## 2021-09-27 DIAGNOSIS — Z8042 Family history of malignant neoplasm of prostate: Secondary | ICD-10-CM

## 2021-09-27 DIAGNOSIS — M6281 Muscle weakness (generalized): Secondary | ICD-10-CM | POA: Insufficient documentation

## 2021-09-27 DIAGNOSIS — Z23 Encounter for immunization: Secondary | ICD-10-CM

## 2021-09-27 DIAGNOSIS — Z8546 Personal history of malignant neoplasm of prostate: Secondary | ICD-10-CM

## 2021-09-27 LAB — URINALYSIS, ROUTINE W REFLEX MICROSCOPIC
Bilirubin Urine: NEGATIVE
Glucose, UA: 150 mg/dL — AB
Ketones, ur: NEGATIVE mg/dL
Leukocytes,Ua: NEGATIVE
Nitrite: NEGATIVE
Protein, ur: 100 mg/dL — AB
Specific Gravity, Urine: 1.014 (ref 1.005–1.030)
pH: 5 (ref 5.0–8.0)

## 2021-09-27 LAB — CBC WITH DIFFERENTIAL/PLATELET
Abs Immature Granulocytes: 0.07 10*3/uL (ref 0.00–0.07)
Basophils Absolute: 0.1 10*3/uL (ref 0.0–0.1)
Basophils Relative: 0 %
Eosinophils Absolute: 0.1 10*3/uL (ref 0.0–0.5)
Eosinophils Relative: 1 %
HCT: 34.2 % — ABNORMAL LOW (ref 39.0–52.0)
Hemoglobin: 11.4 g/dL — ABNORMAL LOW (ref 13.0–17.0)
Immature Granulocytes: 1 %
Lymphocytes Relative: 11 %
Lymphs Abs: 1.4 10*3/uL (ref 0.7–4.0)
MCH: 28.1 pg (ref 26.0–34.0)
MCHC: 33.3 g/dL (ref 30.0–36.0)
MCV: 84.4 fL (ref 80.0–100.0)
Monocytes Absolute: 1.2 10*3/uL — ABNORMAL HIGH (ref 0.1–1.0)
Monocytes Relative: 9 %
Neutro Abs: 10.2 10*3/uL — ABNORMAL HIGH (ref 1.7–7.7)
Neutrophils Relative %: 78 %
Platelets: 275 10*3/uL (ref 150–400)
RBC: 4.05 MIL/uL — ABNORMAL LOW (ref 4.22–5.81)
RDW: 14.6 % (ref 11.5–15.5)
WBC: 13 10*3/uL — ABNORMAL HIGH (ref 4.0–10.5)
nRBC: 0 % (ref 0.0–0.2)

## 2021-09-27 LAB — BASIC METABOLIC PANEL
Anion gap: 19 — ABNORMAL HIGH (ref 5–15)
BUN: 84 mg/dL — ABNORMAL HIGH (ref 8–23)
CO2: 20 mmol/L — ABNORMAL LOW (ref 22–32)
Calcium: 9.3 mg/dL (ref 8.9–10.3)
Chloride: 101 mmol/L (ref 98–111)
Creatinine, Ser: 13 mg/dL — ABNORMAL HIGH (ref 0.61–1.24)
GFR, Estimated: 4 mL/min — ABNORMAL LOW (ref >60)
Glucose, Bld: 95 mg/dL (ref 70–99)
Potassium: 3.7 mmol/L (ref 3.5–5.1)
Sodium: 140 mmol/L (ref 135–145)

## 2021-09-27 LAB — PROTEIN / CREATININE RATIO, URINE
Creatinine, Urine: 153.66 mg/dL
Protein Creatinine Ratio: 1.38 mg/mg{Cre} — ABNORMAL HIGH (ref 0.00–0.15)
Total Protein, Urine: 212 mg/dL

## 2021-09-27 LAB — CBG MONITORING, ED: Glucose-Capillary: 71 mg/dL (ref 70–99)

## 2021-09-27 MED ORDER — DORZOLAMIDE HCL-TIMOLOL MAL 2-0.5 % OP SOLN
1.0000 [drp] | Freq: Every day | OPHTHALMIC | Status: DC
  Administered 2021-09-29 – 2021-10-04 (×6): 1 [drp] via OPHTHALMIC
  Filled 2021-09-27: qty 10

## 2021-09-27 MED ORDER — ACETAMINOPHEN 650 MG RE SUPP: 650.0000 mg | Freq: Four times a day (QID) | RECTAL | Status: DC | PRN

## 2021-09-27 MED ORDER — ASPIRIN EC 81 MG PO TBEC
81.0000 mg | DELAYED_RELEASE_TABLET | Freq: Every day | ORAL | Status: DC
  Administered 2021-09-28: 81 mg via ORAL
  Filled 2021-09-27: qty 1

## 2021-09-27 MED ORDER — HEPARIN SODIUM (PORCINE) 5000 UNIT/ML IJ SOLN
5000.0000 [IU] | Freq: Three times a day (TID) | INTRAMUSCULAR | Status: DC
  Administered 2021-09-27 – 2021-09-28 (×3): 5000 [IU] via SUBCUTANEOUS
  Filled 2021-09-27 (×3): qty 1

## 2021-09-27 MED ORDER — CLOPIDOGREL BISULFATE 75 MG PO TABS
75.0000 mg | ORAL_TABLET | Freq: Every day | ORAL | Status: DC
  Administered 2021-09-28: 75 mg via ORAL
  Filled 2021-09-27: qty 1

## 2021-09-27 MED ORDER — ONDANSETRON HCL 4 MG/2ML IJ SOLN
4.0000 mg | Freq: Four times a day (QID) | INTRAMUSCULAR | Status: DC | PRN
  Administered 2021-09-28: 4 mg via INTRAVENOUS
  Filled 2021-09-27: qty 2

## 2021-09-27 MED ORDER — TAMSULOSIN HCL 0.4 MG PO CAPS
0.4000 mg | ORAL_CAPSULE | Freq: Every day | ORAL | Status: DC
  Administered 2021-09-28 – 2021-10-04 (×7): 0.4 mg via ORAL
  Filled 2021-09-27 (×7): qty 1

## 2021-09-27 MED ORDER — SODIUM CHLORIDE 0.9 % IV SOLN
INTRAVENOUS | Status: DC
Start: 2021-09-27 — End: 2021-09-30

## 2021-09-27 MED ORDER — ATORVASTATIN CALCIUM 80 MG PO TABS
80.0000 mg | ORAL_TABLET | Freq: Every day | ORAL | Status: DC
  Administered 2021-09-28 – 2021-10-04 (×7): 80 mg via ORAL
  Filled 2021-09-27 (×5): qty 1
  Filled 2021-09-27: qty 2
  Filled 2021-09-27: qty 1

## 2021-09-27 MED ORDER — FLUTICASONE PROPIONATE 50 MCG/ACT NA SUSP
2.0000 | Freq: Every day | NASAL | Status: DC
  Administered 2021-09-29 – 2021-10-04 (×6): 2 via NASAL
  Filled 2021-09-27: qty 16

## 2021-09-27 MED ORDER — ACETAMINOPHEN 325 MG PO TABS
650.0000 mg | ORAL_TABLET | Freq: Four times a day (QID) | ORAL | Status: DC | PRN
Start: 2021-09-27 — End: 2021-10-05

## 2021-09-27 MED ORDER — CARVEDILOL 25 MG PO TABS
25.0000 mg | ORAL_TABLET | Freq: Two times a day (BID) | ORAL | Status: DC
  Administered 2021-09-28 – 2021-10-04 (×11): 25 mg via ORAL
  Filled 2021-09-27 (×4): qty 1
  Filled 2021-09-27: qty 2
  Filled 2021-09-27 (×6): qty 1

## 2021-09-27 MED ORDER — SENNOSIDES-DOCUSATE SODIUM 8.6-50 MG PO TABS: 1.0000 | ORAL_TABLET | Freq: Every evening | ORAL | Status: DC | PRN

## 2021-09-27 MED ORDER — ONDANSETRON HCL 4 MG PO TABS: 4.0000 mg | ORAL_TABLET | Freq: Four times a day (QID) | ORAL | Status: DC | PRN

## 2021-09-27 NOTE — Consult Note (Addendum)
[facility] ?Renal Consultation Note ? ?Requesting MD: Davonna Belling, MD ?Indication for Consultation:  AKI ? ?Chief complaint: directed to ER after routine PCP labs ? ?HPI:  ?Kevin Soto is a 61 y.o. male with a history of HTN, CAD, DM, prior CVA, ischemic cardiomyopathy, and CKD stage III who presented to the hospital at the urging of his PCP, where he had gone for a routine labs.  He follows at [facility].  He does not have a nephrologist.  He states that he has been eating okay until recently.  He did have some nausea and has been throwing up in the morning the last couple of days.  He denies any shortness of breath or any abdominal pain.  He denies any diarrhea.  He states that food tastes normal.  He has not had as much of an appetite for about the past week.  He states that he has been urinating without difficulty and is making approximately normal amount of urine.  Note that the patient's home medications include hydrochlorothiazide, lisinopril, jardiance, and metformin.  He denies any new medications.  He has been on each of those particularly for a while.  He denies any Advil, Motrin, BC powders, Goody powders, alcohol also or other nonsteroidals.  He does take a baby aspirin daily for his heart.  He denies any family history of end-stage renal disease or chronic kidney disease.  His mother does have history of kidney stones.  ? ?The patient understands that we are aggressively trying medical measures to improve his kidney function including hydration and holding medications.  If his renal function does not improve he would want dialysis if it were needed to save his life.  He and I have discussed the risks and benefits and indications for dialysis as well as risks of foregoing dialysis if it were to become indicated.  ? ?Creatinine  ?Date/Time Value Ref Range Status  ?06/25/2013 02:27 PM 1.7 (H) 0.7 - 1.3 mg/dL Final  ?12/09/2012 10:35 AM 1.5 (H) 0.7 - 1.3 mg/dL Final   ?08/07/2012 10:59 AM 1.6 (H) 0.7 - 1.3 mg/dL Final  ? ?Creat  ?Date/Time Value Ref Range Status  ?05/02/2015 12:03 PM 1.81 (H) 0.70 - 1.33 mg/dL Final  ?08/17/2014 03:32 PM 1.73 (H) 0.50 - 1.35 mg/dL Final  ?12/30/2012 03:38 PM 1.69 (H) 0.50 - 1.35 mg/dL Final  ?11/13/2011 03:03 PM 1.37 (H) 0.50 - 1.35 mg/dL Final  ?02/22/2011 02:14 PM 1.11 0.50 - 1.35 mg/dL Final  ?  Comment:  ?    ? ?Creatinine, Ser  ?Date/Time Value Ref Range Status  ?09/27/2021 01:21 PM 13.00 (H) 0.61 - 1.24 mg/dL Final  ?07/14/2021 10:33 AM 1.76 (H) 0.76 - 1.27 mg/dL Final  ?01/19/2021 10:51 AM 1.69 (H) 0.61 - 1.24 mg/dL Final  ?10/26/2019 03:35 PM 1.82 (H) 0.76 - 1.27 mg/dL Final  ?05/02/2017 01:07 PM 1.69 (H) 0.61 - 1.24 mg/dL Final  ?10/19/2016 11:00 AM 2.57 (H) 0.61 - 1.24 mg/dL Final  ?07/10/2016 03:07 PM 2.42 (H) 0.76 - 1.27 mg/dL Final  ?05/08/2016 02:57 PM 2.44 (H) 0.76 - 1.27 mg/dL Final  ?01/31/2016 02:42 PM 1.90 (H) 0.76 - 1.27 mg/dL Final  ?02/27/2011 05:23 PM 1.11 0.50 - 1.35 mg/dL Final  ?02/27/2011 01:10 PM 1.26 0.50 - 1.35 mg/dL Final  ?  Comment:  ?  QA FLAGS AND/OR RANGES MODIFIED BY DEMOGRAPHIC UPDATE ON 10/09 AT 1346  ?01/12/2011 05:40 AM 1.10 0.50 - 1.35 mg/dL Final  ?01/08/2011 09:07 AM 1.4 0.4 -  1.5 mg/dL Final  ?12/20/2010 08:13 AM 1.2 0.4 - 1.5 mg/dL Final  ?12/13/2010 09:24 AM 1.2 0.4 - 1.5 mg/dL Final  ?11/28/2010 09:15 AM 0.95 0.50 - 1.35 mg/dL Final  ?  Comment:  ?  **Please note change in reference range.**  ? ? ? ?PMHx: ?  ?Past Medical History:  ?Diagnosis Date  ? Anemia   ? CAD (coronary artery disease)   ? cath 8/12:  LM ok, mLAD occluded with trivial collats from right AM and dRCA, mDx (small) 80%, RI 25%, tiny branch of OM 90%, mAM 30-40%, RCA sub-totally occluded, then 80%, EF 40%, inf HK.  He underwent PCI with [name] with placement of a Promus DES to the mRCA x 2  ? Chronic kidney disease   ? stage 3 per chart  ? Diabetes mellitus   ? Dyslipidemia   ? ED (erectile dysfunction)   ? Gait instability  02/27/2011  ? History of alcohol abuse   ? History of stomach ulcers   ? History of stroke   ? evidence of CVA on MRI in past  ? Hyperlipidemia   ? Hypertension   ? Ischemic cardiomyopathy   ? echo 7/12: EF 40-45%, septal, apical, inf basal HK, mod LVH, mild MR, mild LAE.  EF 55% by echo 2015  ? Macular hemorrhage   ? Prostate cancer ([place])   ? Sleep apnea   ? no c-pap; wife denies  ? Vision, loss, sudden 02/27/2011  ? ? ?Past Surgical History:  ?Procedure Laterality Date  ? CORONARY ANGIOPLASTY WITH STENT PLACEMENT  2012  ? 2 stents  ? RADIOACTIVE SEED IMPLANT N/A 05/08/2017  ? Procedure: RADIOACTIVE SEED IMPLANT/BRACHYTHERAPY IMPLANT;  Surgeon: Alexis Frock, MD;  Location[facility];  Service: Urology;  Laterality: N/A;  ? [place] SURGERY  2012  ? right and left eye/ per pt only left eye  ? SPACE OAR INSTILLATION N/A 05/08/2017  ? Procedure: SPACE OAR INSTILLATION;  Surgeon: Alexis Frock, MD;  Location: [facility];  Service: Urology;  Laterality: N/A;  ? ? ?Family Hx:  ?Family History  ?Adopted: Yes  ?Problem Relation Age of Onset  ? Heart attack Mother   ?     Died 61  ? Heart failure Mother   ? Diabetes Mother   ? Heart attack Father   ? Heart failure Father   ? Diabetes Father   ? Prostate cancer Father   ? Cancer Father   ?     Prostate  ? Heart disease Sister   ?     s/p CABG x3 in her mid 33's  ? Crohn's disease Sister   ? Crohn's disease Other   ?     niece  ? Kidney disease Brother   ? ? ?Social History:  reports that he has never smoked. He has never used smokeless tobacco. He reports current alcohol use of about 4.0 standard drinks per week. He reports that he does not use drugs. ? ?Allergies:  ?Allergies  ?Allergen Reactions  ? Clonidine Derivatives   ?  Dizzy, too sleepy  ? ? ?Medications: ?Prior to Admission medications   ?Medication Sig Start Date End Date Taking? Authorizing Provider  ?Accu-Chek FastClix Lancets MISC Use to check blood sugar up to 7 times a  week 06/08/20   Harvie Heck, MD  ?aspirin EC 81 MG tablet Take 1 tablet (81 mg total) by mouth daily. 06/08/20   Harvie Heck, MD  ?atorvastatin (LIPITOR) 80 MG tablet Take  1 tablet (80 mg total) by mouth daily. 07/22/20   Minus Breeding, MD  ?Blood Glucose Monitoring Suppl (ONETOUCH VERIO REFLECT) w/Device KIT Check blood sugar 1 time a day 09/23/20   Riesa Pope, MD  ?carvedilol (COREG) 25 MG tablet TAKE 1 TABLET BY MOUTH  TWICE DAILY WITH A MEAL 06/14/21   Rehman, Areeg N, DO  ?clopidogrel (PLAVIX) 75 MG tablet Take 1 tablet (75 mg total) by mouth daily. 06/14/21   Rehman, Areeg N, DO  ?dorzolamide-timolol (COSOPT) 22.3-6.8 MG/ML ophthalmic solution  03/25/20   [provider]  ?Dulaglutide (TRULICITY) 1.5 YD/7.4JO SOPN INJECT SUBCUTANEOUSLY 1.5  MG INTO THE SKIN WEEKLY 06/08/20   Harvie Heck, MD  ?Empagliflozin-metFORMIN HCl (SYNJARDY) 09-998 MG TABS Take 1 tablet by mouth 2 (two) times daily. 06/08/20   Harvie Heck, MD  ?fluticasone (FLONASE) 50 MCG/ACT nasal spray Place 2 sprays into both nostrils daily. 06/08/20   Harvie Heck, MD  ?glucose blood (ONETOUCH VERIO) test strip Check blood sugar 1 time a day 09/23/20   Riesa Pope, MD  ?hydrochlorothiazide (HYDRODIURIL) 25 MG tablet TAKE 1 TABLET BY MOUTH  DAILY 06/12/21   Iona Beard, MD  ?Lancets [facility] DELICA PLUS [id number]) [place] Check blood sugar 1 time a day 09/23/20   Riesa Pope, MD  ?lisinopril (ZESTRIL) 20 MG tablet Take 1 tablet (20 mg total) by mouth daily. 06/08/20   Harvie Heck, MD  ?nitroGLYCERIN (NITROSTAT) 0.4 MG SL tablet Place 1 tablet (0.4 mg total) under the tongue every 5 (five) minutes as needed. 06/08/20   Harvie Heck, MD  ?tamsulosin (FLOMAX) 0.4 MG CAPS capsule Take 1 capsule (0.4 mg total) by mouth daily. 06/14/21   Rehman, Areeg N, DO  ? ? I have reviewed the patient's current and reported prior to admission medications. ? ?Labs:  ? ?  Latest Ref Rng & Units 09/27/2021  ?  1:21 PM 07/14/2021  ? 10:33 AM  01/19/2021  ? 10:51 AM  ?BMP  ?Glucose 70 - 99 mg/dL 95   165   183    ?BUN 8 - 23 mg/dL 84   17   11    ?Creatinine 0.61 - 1.24 mg/dL 13.00   1.76   1.69    ?BUN/Creat Ratio 10 - 24  10     ?Sodium 135 - 145 mmol/L

## 2021-09-27 NOTE — Hospital Course (Addendum)
AKI on CKD3b, now ESRD on TTS HD Proteinuria  Uremia, improved AGMA, resolved  Patient with a history of CKD, diabetes and hypertension found to have significant increase in BUN/sCr to 84/13 from a baseline of around 30/1.7-1.8.  Patient reported nausea and vomiting as well as mild atelectasis on exam but mental status stable, alert and oriented x4. Patient significantly dry on exam. CT abdomen pelvis negative for hydronephrosis. Patient very dehydrated on exam and is was on multiple nephrotoxic medications such as HCTZ, Jardiance, lisinopril and metformin. UA shows proteinuria likely secondary to diabetic nephropathy. Patient at high risk of progressing to ESRD. Started IV hydration and monitored for improvement in kidney function. Unfortunately, no improvement/ worsening renal function despite 2 days of aggressive volume resuscitation. Patient's acute on chronic renal failure/uremia likely a combination of prerenal and intrinsic insults given poor PO intake, dry on exam during presentation and was on multiple nephrotoxic medications. Given non-improving renal failure, HD cath placed and HD initiated. Renal biopsy done on 10/03/21.  Much improvement in renal function and clinical status with HD. Workup for rapidly progressive kidney disease reassuring, and biopsy showing showing chronic diabetic changes, moderate chronicity.  Low chance of renal recovery discussed with the patient by nephrology team; he reports understanding. He is sent home after HD, and is scheduled for TTS outpatient HD. --Discontinued HCTZ, lisinopril, Jardiance and metformin at time of discharge  --Could benefit from Torsemide during hospital follow up given pt continues to produce urine.   Hypokalemia  K fluctuating at 3-3.5. Repleted as needed.    Coffee ground emesis, resolved  Hx of normocytic anemia Isolated episode of coffee ground emesis the day after admission. GI was consulted, recommending EGD once pt was stable from  renal standpoint. Hgb stable at 9, but baseline is around 10-11; suspect CKD contributing. Iron studies were reassuring. EGD this admission was reassuring. Coffee-ground emesis was likely from uremia associated platelet dysfunction.  --Continue Protonix 40 mg PO BID x6 weeks, then reduce to 40 mg daily lowest effective dose to control reflux symptoms.  HTN HLD CAD Ischemic cardiomyopathy Patient with a history of uncontrolled blood pressure on multiple antihypertensive regimen. BP with systolic in the 127N to 170Y on admission. Recent lipid panel showed LDL of 69. Has a history of macular degeneration leading to blurry eyes but denies any chest pain, headaches, palpitations or shortness of breath. No clinical signs of heart failure. Euvolemic on exam. --Continued Coreg 25 mg BID --Held lisinopril and HCTZ and discontinued at time of discharge  --Continued home ASA 81 mg and Plavix 75 mg daily --Home Lipitor 80 mg daily discontinued per nephrology    T2DM Relatively well controlled. Last A1c 7.2% ~3 months ago.  CBG of 71 today.  --Hold metformin and Jardiance as above --SSI with meal, with CBG checks --Follow-up repeat A1c

## 2021-09-27 NOTE — ED Triage Notes (Signed)
Pt arrived by [facility] from home. Patient has no complaints, went for a checkup yesterday and was called to come to ED for elevated BUN and Cr.  ?

## 2021-09-27 NOTE — ED Notes (Signed)
Patient transported to CT 

## 2021-09-27 NOTE — H&P (Signed)
? ? ? ?Date: 09/27/2021     ?     ?     ?Patient Name:  Kevin Soto MRN: [id number]  ?DOB: [birth year] Age / Sex: 61 y.o., male   ?PCP: Harvie Heck, MD    ?     ?Medical Service: Internal Medicine Teaching Service    ?     ?Attending Physician: Dr. Velna Ochs, MD    ?First Contact: Dr. Posey Pronto  Pager: [phone]  ?Second Contact: Dr. Coy Saunas Pager: [phone]  ?     ?After Hours (After 5p/  First Contact Pager: [phone]  ?weekends / holidays): Second Contact Pager: [phone]  ? ?Chief Complaint: Abnormal lab ? ?History of Present Illness: Kevin Soto is a 61 y.o. male with a history of HTN, CAD, DM, prior CVA, ischemic cardiomyopathy, and CKD stage IIIB presented to [facility] ED via EMS for evaluation of abnormal labs.  Patient reports he had a routine checkup at [facility] yesterday and they did some blood work.  Today, he received a phone call from the practice asking him to go to the ED because of abnormal kidney labs.  Patient reports overall he feels fine. He does endorse some nausea the past 2 days with associated vomiting yesterday morning and this morning. He has some chronic back pain, blurry vision and some difficulty with ambulation but no significant changes to theses symptoms. He endorses mild fatigue but denies any headache, abdominal pain, chest pain, shortness of breath, leg pain, dysuria, hematuria, constipation, diarrhea, dizziness, fevers, chills or palpitation.  Patient also denies any NSAID or Goody powder use. ? ? ?ED course: ?BUN/creatinine 84/13, AGMA, WBC 13.0, Hgb 11.4, UA showed glucosuria and proteinuria but no evidence of infection.  Patient started on IV fluids and nephrology consulted. IMTS is consulted for admission. ? ?Meds:  ?Current Meds  ?Medication Sig  ? aspirin EC 81 MG tablet Take 1 tablet (81 mg total) by mouth daily.  ? atorvastatin (LIPITOR) 80 MG tablet Take 1 tablet (80 mg total) by mouth daily.  ? carvedilol (COREG) 25 MG tablet TAKE 1 TABLET BY MOUTH  TWICE  DAILY WITH A MEAL (Patient taking differently: Take 25 mg by mouth 2 (two) times daily with a meal.)  ? clopidogrel (PLAVIX) 75 MG tablet Take 1 tablet (75 mg total) by mouth daily.  ? dorzolamide-timolol (COSOPT) 22.3-6.8 MG/ML ophthalmic solution Place 1 drop into both eyes daily.  ? Dulaglutide (TRULICITY) 1.5 GQ/9.1QX SOPN INJECT SUBCUTANEOUSLY 1.5  MG INTO THE SKIN WEEKLY (Patient taking differently: Inject 1.5 mg into the skin once a week.)  ? Empagliflozin-metFORMIN HCl (SYNJARDY) 09-998 MG TABS Take 1 tablet by mouth 2 (two) times daily.  ? fluticasone (FLONASE) 50 MCG/ACT nasal spray Place 2 sprays into both nostrils daily.  ? hydrochlorothiazide (HYDRODIURIL) 25 MG tablet TAKE 1 TABLET BY MOUTH  DAILY (Patient taking differently: Take 25 mg by mouth daily.)  ? lisinopril (ZESTRIL) 30 MG tablet Take 30 mg by mouth daily.  ? nitroGLYCERIN (NITROSTAT) 0.4 MG SL tablet Place 1 tablet (0.4 mg total) under the tongue every 5 (five) minutes as needed.  ? rosuvastatin (CRESTOR) 40 MG tablet Take 40 mg by mouth daily.  ? tamsulosin (FLOMAX) 0.4 MG CAPS capsule Take 1 capsule (0.4 mg total) by mouth daily.  ? Vitamin D, Ergocalciferol, (DRISDOL) 1.25 MG (50000 UNIT) CAPS capsule Take 50,000 Units by mouth every 7 (seven) days.  ? ? ? ?Allergies: ?Allergies as of 09/27/2021 - Review Complete 09/27/2021  ?  Allergen Reaction Noted  ? Clonidine derivatives  12/15/2010  ? ?Past Medical History:  ?Diagnosis Date  ? Anemia   ? CAD (coronary artery disease)   ? cath 8/12:  LM ok, mLAD occluded with trivial collats from right AM and dRCA, mDx (small) 80%, RI 25%, tiny branch of OM 90%, mAM 30-40%, RCA sub-totally occluded, then 80%, EF 40%, inf HK.  He underwent PCI with [name] with placement of a Promus DES to the mRCA x 2  ? Chronic kidney disease   ? stage 3 per chart  ? Diabetes mellitus   ? Dyslipidemia   ? ED (erectile dysfunction)   ? Gait instability 02/27/2011  ? History of alcohol abuse   ? History of stomach  ulcers   ? History of stroke   ? evidence of CVA on MRI in past  ? Hyperlipidemia   ? Hypertension   ? Ischemic cardiomyopathy   ? echo 7/12: EF 40-45%, septal, apical, inf basal HK, mod LVH, mild MR, mild LAE.  EF 55% by echo 2015  ? Macular hemorrhage   ? Prostate cancer ([place])   ? Sleep apnea   ? no c-pap; wife denies  ? Vision, loss, sudden 02/27/2011  ? ? ?Family History: History of hypertension in mother and father.  History of heart disease and diabetes in mother. ? ?Social History: Lives in [place] with his sister.  Independent with ADLs.  Ambulates without assistive device.  Reports occasional alcohol use.  Denies any tobacco or illicit drug use.  PCP is Dr. Marva Panda but sometimes go to [facility]. ? ?Review of Systems: ?A complete ROS was negative except as per HPI.  ? ?Physical Exam: ?Blood pressure (!) 113/96, pulse (!) 104, temperature 99.2 ?F (37.3 ?C), temperature source Oral, resp. rate 19, height 6' (1.829 m), weight 97.5 kg, SpO2 100 %. ? ?General: Pleasant, well-appearing elderly man laying in bed. No acute distress. ?HEENT: [place]/AT. Dry mucous membrane. Anicteric sclera ?CV: Tachycardic.  Regular rhythm. No murmurs, rubs, or gallops. No LE edema ?Pulmonary: Lungs CTAB. Normal effort. No wheezing or rales. ?Abdominal: Soft, nontender, nondistended. Normal bowel sounds. ?Extremities: Mild asterixis. Palpable pulses. Normal ROM. Onychauxis  ?Skin: Warm and dry. No obvious rash or lesions. Healed bruises on anterior aspect of bilateral legs.  ?Neuro: A&Ox3. Delayed response to questions. Moves all extremities. Normal sensation to gross touch. ?Psych: Normal mood and affect ? ?EKG: personally reviewed my interpretation is normal sinus rhythm ? ?CXR: Pending ? ?Assessment & Plan by Problem: ?Principal Problem: ?  Uremia ? ?Kevin Soto is a 61 y.o. male with a history of HTN, CAD, DM, prior CVA, ischemic cardiomyopathy, and CKD stage IIIB presented to [facility] ED via EMS for evaluation of abnormal  labs found to have acute on chronic renal failure. ? ? ?#AKI on CKD ?#Uremia ?#AGMA ?Patient with a history of CKD, diabetes and hypertension found to have significant increase in BUN/sCr to 84/13 from a baseline of around 30/1.7-1.8.  Patient reported nausea and vomiting as well as mild atelectasis on exam but mental status stable, alert and oriented x4. Patient significantly dry on exam.  CT abdomen pelvis negative for hydronephrosis. Patient's acute on chronic renal failure/uremia likely a combination of prerenal and intrinsic insults. Patient very dehydrated on exam and is on multiple nephrotoxic medications such as HCTZ, Jardiance, lisinopril and metformin. UA shows proteinuria likely secondary to diabetic nephropathy.  Patient at high risk of progressing to ESRD.  We will continue IV hydration  and monitor for improvement in kidney function. ?--Nephrology following, appreciate recs ?--Continue IVNS at 100 mL/h ?--Hold HCTZ, lisinopril, Jardiance and metformin ?--Strict I&O's ?--Daily BMP ? ?#HTN ?#HLD ?#CAD ?#Ischemic cardiomyopathy ?Patient with a history of uncontrolled blood pressure on multiple antihypertensive regimen. BP with systolic in the 062B to 762G on admission. Recent lipid panel showed LDL of 69. Has a history of macular degeneration leading to blurry eyes but denies any chest pain, headaches, palpitations or shortness of breath. No clinical signs of heart failure. Patient dry on exam. ?--Resume Coreg 25 mg BID, hold lisinopril and HCTZ ?--Resume home ASA 81 mg and Plavix 75 mg daily ?--Resume home Lipitor 80 mg daily ? ?#T2DM ?Relatively well controlled. Last A1c 7.2% ~3 months ago.  CBG of 71 today.  ?-- Hold metformin and Jardiance as above ?--SSI with meal, with CBG checks ?--Follow-up repeat A1c ? ?#Normocytic anemia ?Hemoglobin 11.4 on admission today. Last hemoglobin 14.28 months ago however patient's hemoglobin has been around 10-11 in the past 8 years. No evidence of active  bleed. ?--Daily CBC ? ? ?CODE STATUS: Full ?DIET: Renal ?PPx: Heparin ? ?Dispo: Admit patient to Observation with expected length of stay less than 2 midnights. ? ?Signed: ?Lacinda Axon, MD ?09/27/2021, 7:18 PM

## 2021-09-27 NOTE — ED Provider Notes (Signed)
?[place] ?Provider Note ? ? ?CSN: [id number] ?Arrival date & time: 09/27/21  1305 ? ?  ? ?History ? ?Chief Complaint  ?Patient presents with  ? Abnormal Lab  ? ? ?Kevin Soto is a 61 y.o. male. ? ?The history is provided by the patient and medical records. No language interpreter was used.  ?Abnormal Lab ? ?62 year old male significant history of CKD, CAD status post heart cath in 8/12 with drug-eluting stents, diabetes, dyslipidemia, history of alcohol abuse, hypertension sent here from PCP due to abnormal lab value.  Patient report he was seen at his PCP office yesterday for regular checkup and he was notified today that his lab was abnormal and she needs to come to the ER.  Patient states he does not have any specific complaint except he has been having some trouble with ambulation within the past few weeks and did fell off his bed this morning.  He is unsure what has happened and states that he may have lost balance while he was trying to put on his shirt.  He denies any significant injury from the fall.  He denies any change in medication.  He denies having any headache, dizziness, chest pain, trouble breathing, abdominal pain, back pain, trouble with urination or bowel bladder problems.  No recent medication changes. ? ?Additional history obtained through prior notes, patient was recently seen at outpatient rehab due to having difficulty ambulation for the past 2 weeks and he fell this morning. ? ?Home Medications ?Prior to Admission medications   ?Medication Sig Start Date End Date Taking? Authorizing Provider  ?Accu-Chek FastClix Lancets MISC Use to check blood sugar up to 7 times a week 06/08/20   Harvie Heck, MD  ?aspirin EC 81 MG tablet Take 1 tablet (81 mg total) by mouth daily. 06/08/20   Harvie Heck, MD  ?atorvastatin (LIPITOR) 80 MG tablet Take 1 tablet (80 mg total) by mouth daily. 07/22/20   Minus Breeding, MD  ?Blood Glucose Monitoring Suppl (ONETOUCH  VERIO REFLECT) w/Device KIT Check blood sugar 1 time a day 09/23/20   Riesa Pope, MD  ?carvedilol (COREG) 25 MG tablet TAKE 1 TABLET BY MOUTH  TWICE DAILY WITH A MEAL 06/14/21   Rehman, Areeg N, DO  ?clopidogrel (PLAVIX) 75 MG tablet Take 1 tablet (75 mg total) by mouth daily. 06/14/21   Rehman, Areeg N, DO  ?dorzolamide-timolol (COSOPT) 22.3-6.8 MG/ML ophthalmic solution  03/25/20   [provider]  ?Dulaglutide (TRULICITY) 1.5 WK/4.6KM SOPN INJECT SUBCUTANEOUSLY 1.5  MG INTO THE SKIN WEEKLY 06/08/20   Harvie Heck, MD  ?Empagliflozin-metFORMIN HCl (SYNJARDY) 09-998 MG TABS Take 1 tablet by mouth 2 (two) times daily. 06/08/20   Harvie Heck, MD  ?fluticasone (FLONASE) 50 MCG/ACT nasal spray Place 2 sprays into both nostrils daily. 06/08/20   Harvie Heck, MD  ?glucose blood (ONETOUCH VERIO) test strip Check blood sugar 1 time a day 09/23/20   Riesa Pope, MD  ?hydrochlorothiazide (HYDRODIURIL) 25 MG tablet TAKE 1 TABLET BY MOUTH  DAILY 06/12/21   Iona Beard, MD  ?Lancets [facility] DELICA PLUS [id number]) [place] Check blood sugar 1 time a day 09/23/20   Riesa Pope, MD  ?lisinopril (ZESTRIL) 20 MG tablet Take 1 tablet (20 mg total) by mouth daily. 06/08/20   Harvie Heck, MD  ?nitroGLYCERIN (NITROSTAT) 0.4 MG SL tablet Place 1 tablet (0.4 mg total) under the tongue every 5 (five) minutes as needed. 06/08/20   Harvie Heck, MD  ?tamsulosin (FLOMAX) 0.4 MG  CAPS capsule Take 1 capsule (0.4 mg total) by mouth daily. 06/14/21   Rehman, Areeg N, DO  ?   ? ?Allergies    ?Clonidine derivatives   ? ?Review of Systems   ?Review of Systems  ?All other systems reviewed and are negative. ? ?Physical Exam ?Updated Vital Signs ?BP 114/77 (BP Location: Left Arm)   Pulse (!) 104   Temp 99.2 ?F (37.3 ?C) (Oral)   Resp 20   SpO2 98%  ?Physical Exam ?Vitals and nursing note reviewed.  ?Constitutional:   ?   General: He is not in acute distress. ?   Appearance: He is well-developed.  ?HENT:  ?   Head:  Atraumatic.  ?Eyes:  ?   Conjunctiva/sclera: Conjunctivae normal.  ?Cardiovascular:  ?   Rate and Rhythm: Normal rate and regular rhythm.  ?   Pulses: Normal pulses.  ?   Heart sounds: Normal heart sounds.  ?Pulmonary:  ?   Effort: Pulmonary effort is normal.  ?   Breath sounds: Normal breath sounds.  ?Abdominal:  ?   Palpations: Abdomen is soft.  ?   Tenderness: There is no abdominal tenderness.  ?Musculoskeletal:  ?   Cervical back: Neck supple.  ?   Right lower leg: No edema.  ?   Left lower leg: No edema.  ?Skin: ?   Findings: No rash.  ?Neurological:  ?   Mental Status: He is alert and oriented to person, place, and time.  ?   Comments: Movement is slow to all 4 extremities but with equal effort  ? ? ?ED Results / Procedures / Treatments   ?Labs ?(all labs ordered are listed, but only abnormal results are displayed) ?Labs Reviewed  ?CBC WITH DIFFERENTIAL/PLATELET - Abnormal; Notable for the following components:  ?    Result Value  ? WBC 13.0 (*)   ? RBC 4.05 (*)   ? Hemoglobin 11.4 (*)   ? HCT 34.2 (*)   ? Neutro Abs 10.2 (*)   ? Monocytes Absolute 1.2 (*)   ? All other components within normal limits  ?BASIC METABOLIC PANEL - Abnormal; Notable for the following components:  ? CO2 20 (*)   ? BUN 84 (*)   ? Creatinine, Ser 13.00 (*)   ? GFR, Estimated 4 (*)   ? Anion gap 19 (*)   ? All other components within normal limits  ?URINALYSIS, ROUTINE W REFLEX MICROSCOPIC  ?PROTEIN / CREATININE RATIO, URINE  ?CBG MONITORING, ED  ? ? ?EKG ?EKG Interpretation ? ?Date/Time:  Wednesday Sep 27 2021 16:35:39 EDT ?Ventricular Rate:  95 ?PR Interval:  179 ?QRS Duration: 92 ?QT Interval:  349 ?QTC Calculation: 439 ?R Axis:   21 ?Text Interpretation: Sinus rhythm Low voltage, extremity and precordial leads 12 Lead; [place] Confirmed by Davonna Belling [phone]) on 09/27/2021 4:58:43 PM ? ?Radiology ?No results found. ? ?Procedures ?Marland KitchenCritical Care ?Performed by: Domenic Moras, PA-C ?Authorized by: Domenic Moras, PA-C  ? ?Critical  care provider statement:  ?  Critical care time (minutes):  30 ?  Critical care was time spent personally by me on the following activities:  Development of treatment plan with patient or surrogate, discussions with consultants, evaluation of patient's response to treatment, examination of patient, ordering and review of laboratory studies, ordering and review of radiographic studies, ordering and performing treatments and interventions, pulse oximetry, re-evaluation of patient's condition and review of old charts  ? ? ?Medications Ordered in ED ?Medications  ?0.9 %  sodium chloride infusion (has  no administration in time range)  ? ? ?ED Course/ Medical Decision Making/ A&P ?  ?                        ?Medical Decision Making ? ?BP 106/67   Pulse 93   Temp 99.2 ?F (37.3 ?C) (Oral)   Resp 20   Ht 6' (1.829 m)   Wt 97.5 kg   SpO2 100%   BMI 29.16 kg/m?  ? ?5:04 PM ?This is a 61 year old male with history of chronic kidney disease with a creatinine baseline around 1.7 who was sent here by PCP due to having abnormal renal function from yesterday regular follow-up visit according to the patient.  Patient is a poor historian but he does not endorse any symptoms at this time.  When questioned he did report having some trouble with his gait with increased confusion for the past 1 to 2 weeks which is new.  He also fell this morning.  He has prior stroke and is currently going through rehab.  He denies any medication changes or any recent sickness. ? ?Labs and EKG independently viewed and interpreted by me.  Labs remarkable for abnormal renal function with a creatinine of 13 which is a significant change compared to a creatinine of 1.7 approximately 2 months ago.  Patient also has an elevated white count of 13.0, very nonspecific and he is without any infectious symptoms.  EKG obtained and without concerning arrhythmia or ischemic changes. ? ?Patient is a poor historian, likely secondary to uremia ? ?Appreciate  consultation from on-call nephrologist, Dr. Royce Macadamia, who request for a Foley to be placed and to obtain an abdomen and pelvis CT scan without contrast for further assessment.  She also recommend medicine admission.  We will c

## 2021-09-27 NOTE — Therapy (Signed)
?OUTPATIENT PHYSICAL THERAPY NEURO EVALUATION ? ? ?Patient Name: Kevin Soto ?MRN: [id number] ?DOB:03-28-1961, 61 y.o., male ?Today's Date: 09/27/2021 ? ?PCP: Dr. Loralyn Freshwater Aslma ?REFERRING PROVIDER: Charise Killian, MD  ? ? PT End of Session - 09/27/21 0948   ? ? Visit Number 1   ? Number of Visits 17   ? Date for PT Re-Evaluation 11/22/21   ? Authorization Type [employer] Medicare   ? Progress Note Due on Visit 10   ? PT Start Time 1000   ? PT Stop Time 1030   ? PT Time Calculation (min) 30 min   ? Activity Tolerance Patient tolerated treatment well   ? Behavior During Therapy [facility] for tasks assessed/performed   ? ?  ?  ? ?  ? ? ?Past Medical History:  ?Diagnosis Date  ? Anemia   ? CAD (coronary artery disease)   ? cath 8/12:  LM ok, mLAD occluded with trivial collats from right AM and dRCA, mDx (small) 80%, RI 25%, tiny branch of OM 90%, mAM 30-40%, RCA sub-totally occluded, then 80%, EF 40%, inf HK.  He underwent PCI with [name] with placement of a Promus DES to the mRCA x 2  ? Chronic kidney disease   ? stage 3 per chart  ? Diabetes mellitus   ? Dyslipidemia   ? ED (erectile dysfunction)   ? Gait instability 02/27/2011  ? History of alcohol abuse   ? History of stomach ulcers   ? History of stroke   ? evidence of CVA on MRI in past  ? Hyperlipidemia   ? Hypertension   ? Ischemic cardiomyopathy   ? echo 7/12: EF 40-45%, septal, apical, inf basal HK, mod LVH, mild MR, mild LAE.  EF 55% by echo 2015  ? Macular hemorrhage   ? Prostate cancer ([place])   ? Sleep apnea   ? no c-pap; wife denies  ? Vision, loss, sudden 02/27/2011  ? ?Past Surgical History:  ?Procedure Laterality Date  ? CORONARY ANGIOPLASTY WITH STENT PLACEMENT  2012  ? 2 stents  ? RADIOACTIVE SEED IMPLANT N/A 05/08/2017  ? Procedure: RADIOACTIVE SEED IMPLANT/BRACHYTHERAPY IMPLANT;  Surgeon: Alexis Frock, MD;  Location: [facility];  Service: Urology;  Laterality: N/A;  ? [place] SURGERY  2012  ? right and left eye/ per pt only left eye  ?  SPACE OAR INSTILLATION N/A 05/08/2017  ? Procedure: SPACE OAR INSTILLATION;  Surgeon: Alexis Frock, MD;  Location: [facility];  Service: Urology;  Laterality: N/A;  ? ?Patient Active Problem List  ? Diagnosis Date Noted  ? Type 2 diabetes mellitus with complication, without long-term current use of insulin ([place]) 07/21/2020  ? Dyslipidemia 07/21/2020  ? Memory change 05/07/2018  ? Chronic nasal congestion 11/12/2017  ? Malignant neoplasm of prostate ([place]) 03/11/2017  ? CAD (coronary artery disease)   ? Erectile dysfunction 05/08/2016  ? Advanced care planning/counseling discussion 01/31/2016  ? CKD stage 3 due to type 2 diabetes mellitus ([place]) 08/18/2014  ? Normocytic anemia 06/24/2012  ? Preventative health care 06/24/2012  ? Unsteady gait when walking 02/27/2011  ? Hyperlipidemia due to type 2 diabetes mellitus ([place]) 02/16/2011  ? Atherosclerotic heart disease of native coronary artery with unspecified angina pectoris ([place]) 01/26/2011  ? Diabetes mellitus type 2 with neurological manifestations ([place]) 12/15/2010  ? Dilated cardiomyopathy ([place])   ? Hypertension associated with diabetes ([place])   ? CVA (cerebral vascular accident) ([place]) 11/28/2010  ? ? ?ONSET DATE: 09/07/2021 Date of referral ? ?  REFERRING DIAG: R26.81 (ICD-10-CM) - Unsteady gait when walking I63.10 (ICD-10-CM) - Cerebrovascular accident (CVA) due to embolism of precerebral artery ([place])  ? ?THERAPY DIAG:  ?Other abnormalities of gait and mobility ? ?Muscle weakness (generalized) ? ?SUBJECTIVE:  ?                                                                                                                                                                                           ? ?SUBJECTIVE STATEMENT: ?Patient had a CVA in 2017 and has had difficulties with ambulation since then. Sister reports he fell this morning trying to put his shirt on. They heard the thump. Pt thinks he missed the bed while trying to sit. Sister reports that  his walking has gotten worst in last 2 weeks. Prior to that he wasn't using any AD, he was walking slow but now his walking has significant gotten worst according to his sister.  ? ?Pt accompanied by:  sister ? ?PERTINENT HISTORY: hypertension, diabetes, CAD, hyperlipidemia, and CKD3  ? ?PAIN:  ?Are you having pain? No; pt reports of chronic back pain and reports 5-7/10 pain in back. Standing/walking increases lower back pain and sitting/lying down makes back pain. Pt is not taking anything for back pain. ? ?PRECAUTIONS: Fall ? ?WEIGHT BEARING RESTRICTIONS No ? ?FALLS: Has patient fallen in last 6 months? Yes. Number of falls 1 fall this morning ? ?LIVING ENVIRONMENT: ?Lives with:  sister, and nephew ?Lives in: House/apartment ?Stairs: Yes: External: 4 steps; on right going up ?Has following equipment at home: Quad cane small base and Walker - 2 wheeled ? ?PLOF: Independent ? ?PATIENT GOALS Improve walking ? ?OBJECTIVE:  ? ? ?COGNITION: ?Overall cognitive status: Within functional limits for tasks assessed ?  ?POSTURE: rounded shoulders, forward head, and flexed trunk  ? ? ?TRANSFERS: ?Assistive device utilized: Quad cane small base  ?Sit to stand: Modified independence ?Stand to sit: Modified independence ?Chair to chair: Modified independence ? ? ?GAIT: ?Gait pattern: decreased arm swing- Right, decreased arm swing- Left, decreased ankle dorsiflexion- Right, decreased ankle dorsiflexion- Left, antalgic, trunk flexed, wide BOS, poor foot clearance- Right, and poor foot clearance- Left ?Distance walked: 10 meters, 10 feet ?Assistive device utilized: Quad cane small base ?Level of assistance: Modified independence ? ? ?FUNCTIONAL TESTs:  ?5 times sit to stand: 50 sec with bil UE support where he fell back in chair 2/5 times due to uncontrolled descent and braced his knees against chair 5/5 times. ?Timed up and go (TUG): 40 sec with quad cane;  ?10 meter walk test: 43 sec with quad cane; 0.23 m/s ? ?Pt educated on  making  sure he turns all the way around before he sits down to make sure he doesn't avoid sitting surface. ? ? ?TODAY'S TREATMENT:  ?N/a ? ? ?PATIENT EDUCATION: ?Education details: see above ?Person educated: Patient and sister ?Education method: Explanation ?Education comprehension: verbalized understanding ? ? ?HOME EXERCISE PROGRAM: ?TBD ? ? ? ?GOALS: ?Goals reviewed with patient? Yes ? ?SHORT TERM GOALS: Target date: 10/25/2021   (Remove Blue Hyperlink) ? ?Patient will be compliant with turning fully before he sits down on chair to improve his transfers ?Baseline: Pt approaches sitting surfaces sideways 50% of the time. ?Goal status: INITIAL ? ?2.  Patient will not use back of his knees to stabilize when performing sit to stand to improve functional balance with transfers. ?Baseline: pt uses back of his knees to push against sitting surface to stabilize 100% of the time. ?Goal status: INITIAL ? ?3.  Patient will demo controlled descent when performing stand to sit to improve safety.2 ?Baseline: uncontrolled descent with posterior lean when stand to sit ?Goal status: INITIAL ? ?4.  Patient will be able to ambulate 230' with walker or quad cane without needing rest to improve walking endurance. ?Baseline: 10 meters with quad cane ?Goal status: INITIAL ? ? ?LONG TERM GOALS: Target date: 11/22/2021   (Remove Blue Hyperlink) ? ?Patient will demo gait speed of 0.52ms or better with appropriate AD to improve community mobility and decrease fall risk ?Baseline: 0.289m with quad cane (09/27/21) ?Goal status: INITIAL ? ?2.  Patient will demo <25 seconds with TUG with appropriate AD to improve functional mobility ?Baseline: 40 sec with quad cane (09/27/21) ?Goal status: INITIAL ? ?3.  Patient will demo <40 seconds with 5x sit to stand with bil UE support and without pushing his knees against chair for stability and with controlled descent to improve safety with transfers. ?Baseline: 50 sec with bil UE support where he fell  back in chair 2/5 times due to uncontrolled descent and braced his knees against chair 5/5 times(09/27/21) ?Goal status: INITIAL ? ? ?ASSESSMENT: ? ?CLINICAL IMPRESSION: ?Patient is a [age].o. male who was seen to

## 2021-09-27 NOTE — ED Provider Triage Note (Signed)
Emergency Medicine Provider Triage Evaluation Note ? ?Bishop Dublin , a 61 y.o. male  was evaluated in triage.  Pt presents today for evaluation of abnormal kidney function.  Patient states he received a call from his primary physician recommending emergency room eval due to abnormal creatinine.  Patient does not recall the lab value, provider, or practice name.  Denies chest pain, shortness of breath, difficulty urinating, or peripheral edema. ? ?Review of Systems  ?Positive: As above ?Negative: As above ? ?Physical Exam  ?BP 114/77 (BP Location: Left Arm)   Pulse (!) 104   Temp 99.2 ?F (37.3 ?C) (Oral)   Resp 20   SpO2 98%  ?Gen:   Awake, no distress   ?Resp:  Normal effort  ?MSK:   Moves extremities without difficulty  ?Other:   ? ?Medical Decision Making  ?Medically screening exam initiated at 1:16 PM.  Appropriate orders placed.  Aloys Hupfer Geisen was informed that the remainder of the evaluation will be completed by another provider, this initial triage assessment does not replace that evaluation, and the importance of remaining in the ED until their evaluation is complete. ? ? ?  ?Evlyn Courier, PA-C ?09/27/21 1318 ? ?

## 2021-09-27 NOTE — Telephone Encounter (Signed)
Note entered in error

## 2021-09-28 ENCOUNTER — Ambulatory Visit: Payer: Medicare Other | Admitting: Physical Therapy

## 2021-09-28 ENCOUNTER — Inpatient Hospital Stay (HOSPITAL_COMMUNITY): Payer: Medicare Other

## 2021-09-28 DIAGNOSIS — N186 End stage renal disease: Secondary | ICD-10-CM | POA: Diagnosis not present

## 2021-09-28 DIAGNOSIS — E1122 Type 2 diabetes mellitus with diabetic chronic kidney disease: Secondary | ICD-10-CM | POA: Diagnosis present

## 2021-09-28 DIAGNOSIS — E872 Acidosis, unspecified: Secondary | ICD-10-CM | POA: Diagnosis present

## 2021-09-28 DIAGNOSIS — R112 Nausea with vomiting, unspecified: Secondary | ICD-10-CM | POA: Diagnosis not present

## 2021-09-28 DIAGNOSIS — K2970 Gastritis, unspecified, without bleeding: Secondary | ICD-10-CM | POA: Diagnosis present

## 2021-09-28 DIAGNOSIS — B9681 Helicobacter pylori [H. pylori] as the cause of diseases classified elsewhere: Secondary | ICD-10-CM | POA: Diagnosis not present

## 2021-09-28 DIAGNOSIS — K2100 Gastro-esophageal reflux disease with esophagitis, without bleeding: Secondary | ICD-10-CM | POA: Diagnosis present

## 2021-09-28 DIAGNOSIS — F1010 Alcohol abuse, uncomplicated: Secondary | ICD-10-CM | POA: Diagnosis present

## 2021-09-28 DIAGNOSIS — H5462 Unqualified visual loss, left eye, normal vision right eye: Secondary | ICD-10-CM | POA: Diagnosis present

## 2021-09-28 DIAGNOSIS — E860 Dehydration: Secondary | ICD-10-CM | POA: Diagnosis present

## 2021-09-28 DIAGNOSIS — R319 Hematuria, unspecified: Secondary | ICD-10-CM | POA: Diagnosis present

## 2021-09-28 DIAGNOSIS — H3530 Unspecified macular degeneration: Secondary | ICD-10-CM | POA: Diagnosis present

## 2021-09-28 DIAGNOSIS — K2980 Duodenitis without bleeding: Secondary | ICD-10-CM | POA: Diagnosis present

## 2021-09-28 DIAGNOSIS — Y92003 Bedroom of unspecified non-institutional (private) residence as the place of occurrence of the external cause: Secondary | ICD-10-CM | POA: Diagnosis not present

## 2021-09-28 DIAGNOSIS — K3189 Other diseases of stomach and duodenum: Secondary | ICD-10-CM | POA: Diagnosis not present

## 2021-09-28 DIAGNOSIS — E876 Hypokalemia: Secondary | ICD-10-CM | POA: Diagnosis not present

## 2021-09-28 DIAGNOSIS — K920 Hematemesis: Secondary | ICD-10-CM

## 2021-09-28 DIAGNOSIS — K222 Esophageal obstruction: Secondary | ICD-10-CM | POA: Diagnosis present

## 2021-09-28 DIAGNOSIS — D62 Acute posthemorrhagic anemia: Secondary | ICD-10-CM

## 2021-09-28 DIAGNOSIS — Z23 Encounter for immunization: Secondary | ICD-10-CM | POA: Diagnosis not present

## 2021-09-28 DIAGNOSIS — G4730 Sleep apnea, unspecified: Secondary | ICD-10-CM | POA: Diagnosis present

## 2021-09-28 DIAGNOSIS — E785 Hyperlipidemia, unspecified: Secondary | ICD-10-CM | POA: Diagnosis present

## 2021-09-28 DIAGNOSIS — D631 Anemia in chronic kidney disease: Secondary | ICD-10-CM | POA: Diagnosis present

## 2021-09-28 DIAGNOSIS — N19 Unspecified kidney failure: Secondary | ICD-10-CM

## 2021-09-28 DIAGNOSIS — I255 Ischemic cardiomyopathy: Secondary | ICD-10-CM | POA: Diagnosis present

## 2021-09-28 DIAGNOSIS — N179 Acute kidney failure, unspecified: Secondary | ICD-10-CM | POA: Diagnosis not present

## 2021-09-28 DIAGNOSIS — I120 Hypertensive chronic kidney disease with stage 5 chronic kidney disease or end stage renal disease: Secondary | ICD-10-CM | POA: Diagnosis not present

## 2021-09-28 DIAGNOSIS — W06XXXA Fall from bed, initial encounter: Secondary | ICD-10-CM | POA: Diagnosis present

## 2021-09-28 DIAGNOSIS — K2101 Gastro-esophageal reflux disease with esophagitis, with bleeding: Secondary | ICD-10-CM | POA: Diagnosis not present

## 2021-09-28 DIAGNOSIS — K449 Diaphragmatic hernia without obstruction or gangrene: Secondary | ICD-10-CM | POA: Diagnosis present

## 2021-09-28 DIAGNOSIS — I2510 Atherosclerotic heart disease of native coronary artery without angina pectoris: Secondary | ICD-10-CM | POA: Diagnosis present

## 2021-09-28 DIAGNOSIS — N170 Acute kidney failure with tubular necrosis: Secondary | ICD-10-CM | POA: Diagnosis present

## 2021-09-28 DIAGNOSIS — R34 Anuria and oliguria: Secondary | ICD-10-CM | POA: Diagnosis not present

## 2021-09-28 LAB — RENAL FUNCTION PANEL
Albumin: 2.5 g/dL — ABNORMAL LOW (ref 3.5–5.0)
Anion gap: 21 — ABNORMAL HIGH (ref 5–15)
BUN: 81 mg/dL — ABNORMAL HIGH (ref 8–23)
CO2: 11 mmol/L — ABNORMAL LOW (ref 22–32)
Calcium: 7.3 mg/dL — ABNORMAL LOW (ref 8.9–10.3)
Chloride: 109 mmol/L (ref 98–111)
Creatinine, Ser: 11.77 mg/dL — ABNORMAL HIGH (ref 0.61–1.24)
GFR, Estimated: 4 mL/min — ABNORMAL LOW (ref >60)
Glucose, Bld: 67 mg/dL — ABNORMAL LOW (ref 70–99)
Phosphorus: 5 mg/dL — ABNORMAL HIGH (ref 2.5–4.6)
Potassium: 3.4 mmol/L — ABNORMAL LOW (ref 3.5–5.1)
Sodium: 141 mmol/L (ref 135–145)

## 2021-09-28 LAB — HEMOGLOBIN A1C
Hgb A1c MFr Bld: 7.3 % — ABNORMAL HIGH (ref 4.8–5.6)
Mean Plasma Glucose: 162.81 mg/dL

## 2021-09-28 LAB — CBC WITH DIFFERENTIAL/PLATELET
Abs Immature Granulocytes: 0.06 10*3/uL (ref 0.00–0.07)
Basophils Absolute: 0 10*3/uL (ref 0.0–0.1)
Basophils Relative: 0 %
Eosinophils Absolute: 0 10*3/uL (ref 0.0–0.5)
Eosinophils Relative: 0 %
HCT: 30.9 % — ABNORMAL LOW (ref 39.0–52.0)
Hemoglobin: 10.4 g/dL — ABNORMAL LOW (ref 13.0–17.0)
Immature Granulocytes: 0 %
Lymphocytes Relative: 9 %
Lymphs Abs: 1.5 10*3/uL (ref 0.7–4.0)
MCH: 28.7 pg (ref 26.0–34.0)
MCHC: 33.7 g/dL (ref 30.0–36.0)
MCV: 85.1 fL (ref 80.0–100.0)
Monocytes Absolute: 1.6 10*3/uL — ABNORMAL HIGH (ref 0.1–1.0)
Monocytes Relative: 10 %
Neutro Abs: 13.2 10*3/uL — ABNORMAL HIGH (ref 1.7–7.7)
Neutrophils Relative %: 81 %
Platelets: 269 10*3/uL (ref 150–400)
RBC: 3.63 MIL/uL — ABNORMAL LOW (ref 4.22–5.81)
RDW: 14.9 % (ref 11.5–15.5)
WBC: 16.3 10*3/uL — ABNORMAL HIGH (ref 4.0–10.5)
nRBC: 0 % (ref 0.0–0.2)

## 2021-09-28 LAB — TSH: TSH: 1.525 u[IU]/mL (ref 0.350–4.500)

## 2021-09-28 LAB — CBC
HCT: 30.6 % — ABNORMAL LOW (ref 39.0–52.0)
Hemoglobin: 9.7 g/dL — ABNORMAL LOW (ref 13.0–17.0)
MCH: 27.9 pg (ref 26.0–34.0)
MCHC: 31.7 g/dL (ref 30.0–36.0)
MCV: 87.9 fL (ref 80.0–100.0)
Platelets: 253 10*3/uL (ref 150–400)
RBC: 3.48 MIL/uL — ABNORMAL LOW (ref 4.22–5.81)
RDW: 15.2 % (ref 11.5–15.5)
WBC: 13.3 10*3/uL — ABNORMAL HIGH (ref 4.0–10.5)
nRBC: 0 % (ref 0.0–0.2)

## 2021-09-28 LAB — GLUCOSE, CAPILLARY
Glucose-Capillary: 170 mg/dL — ABNORMAL HIGH (ref 70–99)
Glucose-Capillary: 143 mg/dL — ABNORMAL HIGH (ref 70–99)

## 2021-09-28 LAB — CK: Total CK: 155 U/L (ref 49–397)

## 2021-09-28 LAB — HIV ANTIBODY (ROUTINE TESTING W REFLEX): HIV Screen 4th Generation wRfx: NONREACTIVE

## 2021-09-28 MED ORDER — SODIUM BICARBONATE 650 MG PO TABS
650.0000 mg | ORAL_TABLET | Freq: Two times a day (BID) | ORAL | Status: DC
  Administered 2021-09-28 – 2021-09-29 (×3): 650 mg via ORAL
  Filled 2021-09-28 (×3): qty 1

## 2021-09-28 MED ORDER — ONDANSETRON HCL 4 MG PO TABS
4.0000 mg | ORAL_TABLET | Freq: Three times a day (TID) | ORAL | Status: AC
Start: 2021-09-28 — End: 2021-09-30
  Administered 2021-09-29 – 2021-09-30 (×3): 4 mg via ORAL
  Filled 2021-09-28 (×3): qty 1

## 2021-09-28 MED ORDER — ONDANSETRON HCL 4 MG/2ML IJ SOLN
4.0000 mg | Freq: Three times a day (TID) | INTRAMUSCULAR | Status: AC
  Administered 2021-09-28 – 2021-09-29 (×2): 4 mg via INTRAVENOUS
  Filled 2021-09-28 (×5): qty 2

## 2021-09-28 MED ORDER — INSULIN ASPART 100 UNIT/ML IJ SOLN
0.0000 [IU] | Freq: Three times a day (TID) | INTRAMUSCULAR | Status: DC
  Administered 2021-09-28 – 2021-10-02 (×5): 1 [IU] via SUBCUTANEOUS

## 2021-09-28 MED ORDER — CHLORHEXIDINE GLUCONATE CLOTH 2 % EX PADS
6.0000 | MEDICATED_PAD | Freq: Every day | CUTANEOUS | Status: DC
  Administered 2021-09-29 (×2): 6 via TOPICAL

## 2021-09-28 MED ORDER — PANTOPRAZOLE SODIUM 40 MG IV SOLR
40.0000 mg | Freq: Two times a day (BID) | INTRAVENOUS | Status: DC
  Administered 2021-09-28 – 2021-09-30 (×6): 40 mg via INTRAVENOUS
  Filled 2021-09-28 (×6): qty 10

## 2021-09-28 NOTE — Progress Notes (Addendum)
? ?Subjective: ?No acute overnight events. Nurse noted large amount of brownish coffee ground emesis. Pt reports this morning that this has been ongoing for potentially days-weeks.  ? ?Patient was seen at bedside during rounds today. Pt reports feeling well. He does not have any complaints at this time. Pt denies confusion, SHOB, chest pain, and abdominal pain. He is A&Ox3.  ? ?Pt is updated on the plan for today, and all questions and concerns are addressed.  ? ?Objective: ? ?Vital signs in last 24 hours: ?Vitals:  ? 09/28/21 0700 09/28/21 0745 09/28/21 0922 09/28/21 0950  ?BP: 119/62 125/77 118/86 120/72  ?Pulse: (!) 103 (!) 104 (!) 104 (!) 106  ?Resp: '20 15 20 18  '$ ?Temp:   98.9 ?F (37.2 ?C) 98.1 ?F (36.7 ?C)  ?TempSrc:   Oral Oral  ?SpO2: 99% 98% 100% 99%  ?Weight:      ?Height:      ? ?General: Pleasant, well-appearing elderly man laying in bed. No acute distress. ?HEENT: Teague/AT. Dry mucous membrane. Anicteric sclera. Left eye cataract. Central, non-tender, non-erythematous swelling directly superior to the sternum, concerning for thyromegaly. No cervical, submandibular, or supraclavicular lymphadenopathy present.  ?CV: Tachycardic. Regular rhythm. No murmurs, rubs, or gallops. No LE edema ?Pulmonary: Lungs CTAB. Normal effort. No wheezing or rales. ?Abdominal: Soft, nontender, nondistended. Normal bowel sounds. ?Skin: Warm and dry. Healed bruises on anterior aspect of bilateral legs.  ?Neuro: A&Ox3. Delayed response to questions. Moves all extremities. Normal sensation to gross touch. ?Psych: Normal mood and affect ? ? ?Assessment/Plan: ? ?Principal Problem: ?  Uremia ? ?Kevin Soto is a 61 y.o. male with a history of HTN, CAD, DM, prior CVA, ischemic cardiomyopathy, and CKD stage IIIB admitted for AKI on CKD3b. ? ?AKI on CKD3b, pre-renal versus intrarenal  ?Uremia ?Proteinuria  ?AGMA ?Slight improvement in BUN/Cr from 84/13 to 81/11.7. (baseline 30/1.7-1.8). Drop in bicarb from 20 to 11. Mental status  has been stable; he denies confusion, and is A&O x 3, however, is very slow with responses, and I wonder if ongoing uremia is contributing to mentation. Patient's acute on chronic renal failure/uremia likely a combination of prerenal and intrinsic insults given poor PO intake, dry on exam during presentation and was on multiple nephrotoxic medications. UA with proteinuria and protein/Cr ratio elevated 1.38. UA with hematuria but no RBC's is concerning for rhabdomyolysis as source. CT abdomen/pelvis negative for hydronephrosis---post renal unlikely. Patient at high risk of progressing to ESRD. Given both his mother and father have ESRD (though denied to nephrologist), and this rapidly progressive renal failure, he may need a renal biopsy for possible underlying glomerular diease. We will continue IV hydration and monitor for improvement in renal function. ?--Nephrology following, appreciate assistance  ?--Continue IV NS at 100 mL/h ?--Sodium bicarb ordered ?--Continue with foley catheter  ?--Follow up CK ?--Follow up SPEP, UPEP, free light chains (nephrology)  ?--Hold HCTZ, lisinopril, Jardiance and metformin ?--Strict I&O's ?--Daily BMP ? ?Coffee ground emesis  ?Hx of normocytic anemia ?Nurse noted large amount of brownish coffee ground emesis this morning. He reports that this has been ongoing for several days now. Suspect gastritis/ upper GI bleed. He does not use NSAIDs or Goody Powder, but risk factor includes 1/2 pint of liquor daily. Hgb 11.4 on admission -> 9.7 today. His baseline is around 10-11 in the past 8 years.  ?--GI consulted, appreciate assistance  ?--Protonix 40 mg IV BID started  ?--CBC at 6 pm   ?--Trend CBC and transfuse for Hgb <7  or hemodynamic instability ?  ?Thyromegaly  ?Central, non-tender, non-erythematous swelling directly superior to the sternum, concerning for thyromegaly. No cervical, submandibular, or supraclavicular lymphadenopathy present. No complaints of hypo or hyperthyroidism.  No recent weight loss. TSH wnl at 1.5. Thyroid US reassuring.  ? ?HTN ?HLD ?CAD ?Ischemic cardiomyopathy ?Hx of uncontrolled BP on multiple antihypertensive regimen. BP 120/70's. No clinical signs of heart failure. Patient dry on exam. ?Has a hx of macular degeneration leading to blurry eyes; on exam today, he appears to have left eye cataract and reports ongoing left eye vision loss for several days-- pt is a poor historian, and I suspect this may be ongoing for some time now.  ?--Resume Coreg 25 mg BID ?--Holding lisinopril and HCTZ in setting of AKI  ?--Resume home ASA 81 mg and Plavix 75 mg daily ?--Resume home Lipitor 80 mg daily ?  ?T2DM ?Relatively well controlled with 5/10 A1c 7.3%. Blood glucose 67 this AM.  ?--SSI with meal, with CBG monitoring  ?--Hold metformin and Jardiance in setting of AKI  ?  ? ?Best Practice: ?Diet: Renal ?IVF: NS,100cc/hr ?VTE: Heparin ?Code: Full ? ? ?Lajean Manes, MD  ?Internal Medicine Resident, PGY-1 ?Pager: [phone] ?After 5pm on weekdays and 1pm on weekends: On Call pager [phone] ?

## 2021-09-28 NOTE — ED Notes (Signed)
Upon assessing this pt, there was a large amount of coffee ground emesis found on pt and on the floor.  ?

## 2021-09-28 NOTE — Progress Notes (Signed)
[facility] ?Progress Note ? ?Name: Kevin Soto ?MRN: [id number] ?DOB: 01/29/1961 ? ?Chief Complaint:  ?Here for abnormal labs ? ?Subjective:  ?there is no output documented.  Foley catheter was placed in the ER.  He can't recall if has been emptied.  There is 150 mL urine in the foley on my exam.  He has NS running at 100 ml/hr.   ? ?Review of systems:  ?Denies shortness of breath or chest pain  ?Denies n/v ?--------------- ?Background on consult:  ?Kevin Soto is a 61 y.o. male with a history of HTN, CAD, DM, prior CVA, ischemic cardiomyopathy, and CKD stage III who presented to the hospital at the urging of his PCP, where he had gone for a routine labs.  He follows at [facility].  He does not have a nephrologist.  He states that he has been eating okay until recently.  He did have some nausea and has been throwing up in the morning the last couple of days.  He denies any shortness of breath or any abdominal pain.  He denies any diarrhea.  He states that food tastes normal.  He has not had as much of an appetite for about the past week.  He states that he has been urinating without difficulty and is making approximately normal amount of urine.  Note that the patient's home medications include hydrochlorothiazide, lisinopril, jardiance, and metformin.  He denies any new medications.  He has been on each of those particularly for a while.  He denies any Advil, Motrin, BC powders, Goody powders, alcohol also or other nonsteroidals.  He does take a baby aspirin daily for his heart.  He denies any family history of end-stage renal disease or chronic kidney disease.  His mother does have history of kidney stones.  ?  ?The patient understands that we are aggressively trying medical measures to improve his kidney function including hydration and holding medications.  If his renal function does not improve he would want dialysis if it were needed to save his life.  He and I have discussed the  risks and benefits and indications for dialysis as well as risks of foregoing dialysis if it were to become indicated.  ? ?No intake or output data in the 24 hours ending 09/28/21 1504 ? ?Vitals:  ?Vitals:  ? 09/28/21 0700 09/28/21 0745 09/28/21 0922 09/28/21 0950  ?BP: 119/62 125/77 118/86 120/72  ?Pulse: (!) 103 (!) 104 (!) 104 (!) 106  ?Resp: '20 15 20 18  '$ ?Temp:   98.9 ?F (37.2 ?C) 98.1 ?F (36.7 ?C)  ?TempSrc:   Oral Oral  ?SpO2: 99% 98% 100% 99%  ?Weight:      ?Height:      ?  ? ?Physical Exam:  ?General: Adult male in stretcher in no acute distress at rest ?HEENT: Normocephalic atraumatic ?Eyes: Extraocular movements intact sclera anicteric ?Neck: Supple trachea midline ?Heart: S1-S2 no rub ?Lungs: Clear to auscultation bilaterally normal work of breathing at rest on room air ?Abdomen: Soft nontender nondistended ?Extremities: No edema appreciated no cyanosis or clubbing ?Skin: Somewhat decreased skin turgor no rash on extremities exposed ?Neuro: Alert and oriented x3 provides a history and follows commands ?Psych normal mood and affect ?Genitourinary Foley catheter in place with 150 ml urine ? ?Medications reviewed  ? ?Labs:  ? ?  Latest Ref Rng & Units 09/28/2021  ?  2:36 AM 09/27/2021  ?  1:21 PM 07/14/2021  ? 10:33 AM  ?BMP  ?Glucose 70 -  99 mg/dL 67   95   165    ?BUN 8 - 23 mg/dL 81   84   17    ?Creatinine 0.61 - 1.24 mg/dL 11.77   13.00   1.76    ?BUN/Creat Ratio 10 - 24   10    ?Sodium 135 - 145 mmol/L 141   140   139    ?Potassium 3.5 - 5.1 mmol/L 3.4   3.7   4.1    ?Chloride 98 - 111 mmol/L 109   101   100    ?CO2 22 - 32 mmol/L '11   20   26    '$ ?Calcium 8.9 - 10.3 mg/dL 7.3   9.3   10.0    ? ? ? ?Assessment/Plan:  ? ?# AKI  ?- Concern for multiple pre-renal insults in the setting of diuretic, ACE inhibitor use and nausea with vomiting.  Intake UA with 100 mg/dL protein and 0-5 RBC.  CT a/p without hydro ?- Please hold his home medications hydrochlorothiazide, lisinopril, jardiance, and metformin  ?-  hydrate with normal saline at 100 ml/hr   ?- up/cr ratio 1380 mg/g ?- Continue foley  ?- BMP and phos is ordered for AM ?- requested outpatient labs and last progress note from [facility] - re-entered order now that he's on the floor.  ?  ?# Proteinuria  ?- May be 2/2 DM  ?- SPEP, UPEP, free light chains.  Note random UPEP is send-out and code is [phone] ? ?# CKD stage 3 ?- likely 2/2 DM and HTN ?- Baseline near 1.8 and per the [facility] system he was last at baseline in February per labs available to me  ?- Note mild proteinuria for several years per lab history imported ?  ?# HTN  ?- will hold hydrochlorothiazide and lisinopril ?- Controlled; avoid hypotension ?  ?# DM type 2 with CKD  ?- will hold home jardiance and metformin.   ?  ?# normocytic anemia  ?- mild and has worsened with hydration  ?  ?Disposition - please continue inpatient monitoring. ? ?Claudia Desanctis, MD ?09/28/2021 3:24 PM ? ? ?

## 2021-09-28 NOTE — Progress Notes (Signed)
Inpatient Diabetes Program Recommendations ? ?AACE/ADA: New Consensus Statement on Inpatient Glycemic Control  ? ?Target Ranges:  Prepandial:   less than 140 mg/dL ?     Peak postprandial:   less than 180 mg/dL (1-2 hours) ?     Critically ill patients:  140 - 180 mg/dL  ? ? Latest Reference Range & Units 09/27/21 16:40  ?Glucose-Capillary 70 - 99 mg/dL 71  ? ? Latest Reference Range & Units 09/27/21 13:21 09/28/21 02:36  ?Glucose 70 - 99 mg/dL 95 67 (L)  ? ? Latest Reference Range & Units 07/14/21 09:59 09/28/21 02:36  ?Hemoglobin A1C 4.8 - 5.6 % 7.2 ! 7.3 (H)  ? ? Latest Reference Range & Units 07/14/21 10:33 09/27/21 13:21 09/28/21 02:36  ?BUN 8 - 23 mg/dL 17 84 (H) 81 (H)  ?Creatinine 0.61 - 1.24 mg/dL 1.76 (H) 13.00 (H) 11.77 (H)  ? ?Review of Glycemic Control ? ?Diabetes history: DM2 ?Outpatient Diabetes medications: Trulicity 1.5 mg Qweek, Synjardy 09-998 mg BID ?Current orders for Inpatient glycemic control: None ? ?Inpatient Diabetes Program Recommendations:   ? ?Insulin: Please consider ordering CBGs AC&HS with Novolog 0-6 units TID with meals. ? ?Outpatient: Given renal function and acute kidney injury on chronic kidney disease, discharging provider may want to discontinue Synjardy as an outpatient. ? ?Thanks, ?Barnie Alderman, RN, MSN, CDE ?Diabetes Coordinator ?Inpatient Diabetes Program ?[phone] (Team Pager from 8am to 5pm) ? ? ? ?

## 2021-09-28 NOTE — Consult Note (Addendum)
? ? ?Consultation ? ?Referring Provider: Medicine service/[place] ?Primary Care Physician:  Harvie Heck, MD ?Primary Gastroenterologist:  Dr.Jacobs ? ?Reason for Consultation: Coffee-ground emesis ? ?HPI: Kevin Soto is a 61 y.o. male, known to Dr. Ardis Hughs in our office and Dr Fuller Plan from prior inpatient EGD.  He was admitted to the emergency room yesterday after outpatient labs revealed a creatinine of 13. ?Patient has history of hypertension, coronary artery disease -prior DES RCA, ischemic cardiomyopathy with last EF of 55%, diabetes mellitus, prior CVA and history of prostate cancer for which he underwent seed implants. ?Patient relates that he has not been feeling well over the past couple of weeks, decrease in appetite though still trying to eat.  He has been having intermittent nausea and vomiting, and intermittently has vomited up dark material she describes as being dark brown.  He did have an episode of emesis last night and again today both dark brown.  He says he is not seeing any obvious blood and the material is not necessarily coffee-ground in appearance.  He has had some intermittent heartburn and indigestion over the past week or so and over the past 2 days has developed hiccups.  No dysphagia or odynophagia. ?He is not on chronic PPI. ?He is on aspirin and Plavix at home. ?No NSAID use, denies BCs or Goody powders, does have history of EtOH use but patient and his family state he has not been drinking over the past month or so. ? ?On admission yesterday WBC 13, hemoglobin 11.4/hematocrit 34.2/platelets 275 ?BUN 84/creatinine 13 ?Today potassium 3.4/BUN 81/creatinine 11.77 ?WBC 13.3, hemoglobin 9.7/hematocrit 30.6 ? ?CT of the abdomen and pelvis yesterday on admission without IV contrast shows no focal liver abnormality, no gallstones or gallbladder wall thickening, unremarkable pancreas there is mild bilateral nonspecific perinephric stranding stable since prior exam no hydronephrosis  brachytherapy seeds seen within the prostate, also with extensive multivessel coronary artery calcification. ? ? ?IEGD had been done in 2018 for complaints of epigastric pain, he was found to have mild distal Candida esophagitis, a few erosions in the gastric body and antrum and an 8 mm superficial duodenal ulcer.  Biopsies returned positive for H. pylori and he was treated with a course of Pylera.  I do not see that he did stool testing to confirm eradication ?He had colonoscopy in 2014 for screening and this was normal. ? ? ?Past Medical History:  ?Diagnosis Date  ? Anemia   ? CAD (coronary artery disease)   ? cath 8/12:  LM ok, mLAD occluded with trivial collats from right AM and dRCA, mDx (small) 80%, RI 25%, tiny branch of OM 90%, mAM 30-40%, RCA sub-totally occluded, then 80%, EF 40%, inf HK.  He underwent PCI with [name] with placement of a Promus DES to the mRCA x 2  ? Chronic kidney disease   ? stage 3 per chart  ? Diabetes mellitus   ? Dyslipidemia   ? ED (erectile dysfunction)   ? Gait instability 02/27/2011  ? History of alcohol abuse   ? History of stomach ulcers   ? History of stroke   ? evidence of CVA on MRI in past  ? Hyperlipidemia   ? Hypertension   ? Ischemic cardiomyopathy   ? echo 7/12: EF 40-45%, septal, apical, inf basal HK, mod LVH, mild MR, mild LAE.  EF 55% by echo 2015  ? Macular hemorrhage   ? Prostate cancer ([place])   ? Sleep apnea   ? no c-pap; wife  denies  ? Vision, loss, sudden 02/27/2011  ? ? ?Past Surgical History:  ?Procedure Laterality Date  ? CORONARY ANGIOPLASTY WITH STENT PLACEMENT  2012  ? 2 stents  ? RADIOACTIVE SEED IMPLANT N/A 05/08/2017  ? Procedure: RADIOACTIVE SEED IMPLANT/BRACHYTHERAPY IMPLANT;  Surgeon: Alexis Frock, MD;  Location: [facility];  Service: Urology;  Laterality: N/A;  ? [place] SURGERY  2012  ? right and left eye/ per pt only left eye  ? SPACE OAR INSTILLATION N/A 05/08/2017  ? Procedure: SPACE OAR INSTILLATION;  Surgeon: Alexis Frock, MD;  Location: [facility];  Service: Urology;  Laterality: N/A;  ? ? ?Prior to Admission medications   ?Medication Sig Start Date End Date Taking? Authorizing Provider  ?aspirin EC 81 MG tablet Take 1 tablet (81 mg total) by mouth daily. 06/08/20  Yes Aslam, Loralyn Freshwater, MD  ?atorvastatin (LIPITOR) 80 MG tablet Take 1 tablet (80 mg total) by mouth daily. 07/22/20  Yes Minus Breeding, MD  ?carvedilol (COREG) 25 MG tablet TAKE 1 TABLET BY MOUTH  TWICE DAILY WITH A MEAL ?Patient taking differently: Take 25 mg by mouth 2 (two) times daily with a meal. 06/14/21  Yes Rehman, Areeg N, DO  ?clopidogrel (PLAVIX) 75 MG tablet Take 1 tablet (75 mg total) by mouth daily. 06/14/21  Yes Rehman, Areeg N, DO  ?dorzolamide-timolol (COSOPT) 22.3-6.8 MG/ML ophthalmic solution Place 1 drop into both eyes daily. 03/25/20  Yes [provider]  ?Dulaglutide (TRULICITY) 1.5 MO/2.9UT SOPN INJECT SUBCUTANEOUSLY 1.5  MG INTO THE SKIN WEEKLY ?Patient taking differently: Inject 1.5 mg into the skin once a week. 06/08/20  Yes Aslam, Loralyn Freshwater, MD  ?Empagliflozin-metFORMIN HCl (SYNJARDY) 09-998 MG TABS Take 1 tablet by mouth 2 (two) times daily. 06/08/20  Yes Aslam, Loralyn Freshwater, MD  ?fluticasone (FLONASE) 50 MCG/ACT nasal spray Place 2 sprays into both nostrils daily. 06/08/20  Yes Aslam, Loralyn Freshwater, MD  ?hydrochlorothiazide (HYDRODIURIL) 25 MG tablet TAKE 1 TABLET BY MOUTH  DAILY ?Patient taking differently: Take 25 mg by mouth daily. 06/12/21  Yes Iona Beard, MD  ?lisinopril (ZESTRIL) 30 MG tablet Take 30 mg by mouth daily.   Yes [provider]  ?nitroGLYCERIN (NITROSTAT) 0.4 MG SL tablet Place 1 tablet (0.4 mg total) under the tongue every 5 (five) minutes as needed. 06/08/20  Yes Aslam, Loralyn Freshwater, MD  ?rosuvastatin (CRESTOR) 40 MG tablet Take 40 mg by mouth daily.   Yes [provider]  ?tamsulosin (FLOMAX) 0.4 MG CAPS capsule Take 1 capsule (0.4 mg total) by mouth daily. 06/14/21  Yes Rehman, Areeg N, DO  ?Vitamin D,  Ergocalciferol, (DRISDOL) 1.25 MG (50000 UNIT) CAPS capsule Take 50,000 Units by mouth every 7 (seven) days.   Yes [provider]  ?Accu-Chek FastClix Lancets MISC Use to check blood sugar up to 7 times a week 06/08/20   Harvie Heck, MD  ?Blood Glucose Monitoring Suppl (ONETOUCH VERIO REFLECT) w/Device KIT Check blood sugar 1 time a day 09/23/20   Riesa Pope, MD  ?glucose blood (ONETOUCH VERIO) test strip Check blood sugar 1 time a day 09/23/20   Riesa Pope, MD  ?Lancets [facility] DELICA PLUS [id number]) [place] blood sugar 1 time a day 09/23/20   Riesa Pope, MD  ?lisinopril (ZESTRIL) 20 MG tablet Take 1 tablet (20 mg total) by mouth daily. ?Patient not taking: Reported on 09/27/2021 06/08/20   Harvie Heck, MD  ? ? ?Current Facility-Administered Medications  ?Medication Dose Route Frequency Provider Last Rate Last Admin  ? 0.9 %  sodium chloride infusion   Intravenous Continuous Lacinda Axon, MD 100 mL/hr at 09/27/21 1821 New Bag at 09/27/21 1821  ? acetaminophen (TYLENOL) tablet 650 mg  650 mg Oral Q6H PRN Lacinda Axon, MD      ? Or  ? acetaminophen (TYLENOL) suppository 650 mg  650 mg Rectal Q6H PRN Lacinda Axon, MD      ? aspirin EC tablet 81 mg  81 mg Oral Daily Lacinda Axon, MD   81 mg at 09/28/21 3888  ? atorvastatin (LIPITOR) tablet 80 mg  80 mg Oral Daily Lacinda Axon, MD   80 mg at 09/28/21 2800  ? carvedilol (COREG) tablet 25 mg  25 mg Oral BID WC Lacinda Axon, MD   25 mg at 09/28/21 3491  ? clopidogrel (PLAVIX) tablet 75 mg  75 mg Oral Daily Lacinda Axon, MD   75 mg at 09/28/21 7915  ? dorzolamide-timolol (COSOPT) 22.3-6.8 MG/ML ophthalmic solution 1 drop  1 drop Both Eyes Daily Lacinda Axon, MD      ? fluticasone (FLONASE) 50 MCG/ACT nasal spray 2 spray  2 spray Each Nare Daily Lacinda Axon, MD      ? heparin injection 5,000 Units  5,000 Units Subcutaneous Q8H Lacinda Axon, MD   5,000 Units at  09/28/21 1440  ? insulin aspart (novoLOG) injection 0-6 Units  0-6 Units Subcutaneous TID WC Lajean Manes, MD      ? ondansetron (ZOFRAN) tablet 4 mg  4 mg Oral Q6H PRN Lacinda Axon, MD      ? Or  ? Mellody Life

## 2021-09-28 NOTE — ED Notes (Signed)
ED TO INPATIENT HANDOFF REPORT ? ?ED Nurse Name and Phone #: Lysbeth Galas [phone] ? ?S ?Name/Age/Gender ?Kevin Soto ?61 y.o. ?male ?Room/Bed: 010C/010C ? ?Code Status ?  Code Status: Full Code ? ?Home/SNF/Other ?Home ?Patient oriented to: self, place, time, and situation ?Is this baseline? Yes  ? ?Triage Complete: Triage complete  ?Chief Complaint ?Uremia [N19] ? ?Triage Note ?Pt arrived by [facility] from home. Patient has no complaints, went for a checkup yesterday and was called to come to ED for elevated BUN and Cr.   ? ?Allergies ?Allergies  ?Allergen Reactions  ? Clonidine Derivatives   ?  Dizzy, too sleepy  ? ? ?Level of Care/Admitting Diagnosis ?ED Disposition   ? ? ED Disposition  ?Admit  ? Condition  ?--  ? Comment  ?Hospital Area: [facility] [[id number]] ? Level of Care: Telemetry Medical [104] ? May place patient in observation at [facility] or [place] if equivalent level of care is available:: No ? Covid Evaluation: Asymptomatic - no recent exposure (last 10 days) testing not required ? Diagnosis: Uremia [223110] ? Admitting Physician: Velna Ochs [[id number]] ? Attending Physician: Velna Ochs [[id number]] ?  ?  ? ?  ? ? ?B ?Medical/Surgery History ?Past Medical History:  ?Diagnosis Date  ? Anemia   ? CAD (coronary artery disease)   ? cath 8/12:  LM ok, mLAD occluded with trivial collats from right AM and dRCA, mDx (small) 80%, RI 25%, tiny branch of OM 90%, mAM 30-40%, RCA sub-totally occluded, then 80%, EF 40%, inf HK.  He underwent PCI with [name] with placement of a Promus DES to the mRCA x 2  ? Chronic kidney disease   ? stage 3 per chart  ? Diabetes mellitus   ? Dyslipidemia   ? ED (erectile dysfunction)   ? Gait instability 02/27/2011  ? History of alcohol abuse   ? History of stomach ulcers   ? History of stroke   ? evidence of CVA on MRI in past  ? Hyperlipidemia   ? Hypertension   ? Ischemic cardiomyopathy   ? echo 7/12: EF 40-45%, septal, apical, inf basal HK, mod  LVH, mild MR, mild LAE.  EF 55% by echo 2015  ? Macular hemorrhage   ? Prostate cancer ([place])   ? Sleep apnea   ? no c-pap; wife denies  ? Vision, loss, sudden 02/27/2011  ? ?Past Surgical History:  ?Procedure Laterality Date  ? CORONARY ANGIOPLASTY WITH STENT PLACEMENT  2012  ? 2 stents  ? RADIOACTIVE SEED IMPLANT N/A 05/08/2017  ? Procedure: RADIOACTIVE SEED IMPLANT/BRACHYTHERAPY IMPLANT;  Surgeon: Alexis Frock, MD;  Location: [facility];  Service: Urology;  Laterality: N/A;  ? [place] SURGERY  2012  ? right and left eye/ per pt only left eye  ? SPACE OAR INSTILLATION N/A 05/08/2017  ? Procedure: SPACE OAR INSTILLATION;  Surgeon: Alexis Frock, MD;  Location: [facility];  Service: Urology;  Laterality: N/A;  ?  ? ?A ?IV Location/Drains/Wounds ?Patient Lines/Drains/Airways Status   ? ? Active Line/Drains/Airways   ? ? Name Placement date Placement time Site Days  ? Peripheral IV 09/27/21 20 G Anterior;Right Forearm 09/27/21  1642  Forearm  1  ? Urethral Catheter Shirlee Limerick, RN Double-lumen 16 Fr. 09/27/21  1714  Double-lumen  1  ? Incision (Closed) 05/08/17 Perineum 05/08/17  0944  -- 4580  ? ?  ?  ? ?  ? ? ?Intake/Output Last 24 hours ?No intake or output  data in the 24 hours ending 09/28/21 0842 ? ?Labs/Imaging ?Results for orders placed or performed during the hospital encounter of 09/27/21 (from the past 48 hour(s))  ?CBC with Differential     Status: Abnormal  ? Collection Time: 09/27/21  1:21 PM  ?Result Value Ref Range  ? WBC 13.0 (H) 4.0 - 10.5 K/uL  ? RBC 4.05 (L) 4.22 - 5.81 MIL/uL  ? Hemoglobin 11.4 (L) 13.0 - 17.0 g/dL  ? HCT 34.2 (L) 39.0 - 52.0 %  ? MCV 84.4 80.0 - 100.0 fL  ? MCH 28.1 26.0 - 34.0 pg  ? MCHC 33.3 30.0 - 36.0 g/dL  ? RDW 14.6 11.5 - 15.5 %  ? Platelets 275 150 - 400 K/uL  ? nRBC 0.0 0.0 - 0.2 %  ? Neutrophils Relative % 78 %  ? Neutro Abs 10.2 (H) 1.7 - 7.7 K/uL  ? Lymphocytes Relative 11 %  ? Lymphs Abs 1.4 0.7 - 4.0 K/uL  ? Monocytes Relative 9 %  ?  Monocytes Absolute 1.2 (H) 0.1 - 1.0 K/uL  ? Eosinophils Relative 1 %  ? Eosinophils Absolute 0.1 0.0 - 0.5 K/uL  ? Basophils Relative 0 %  ? Basophils Absolute 0.1 0.0 - 0.1 K/uL  ? Immature Granulocytes 1 %  ? Abs Immature Granulocytes 0.07 0.00 - 0.07 K/uL  ?  Comment: Performed at [facility], [place] [address] [place], [place] [address]  ?Basic metabolic panel     Status: Abnormal  ? Collection Time: 09/27/21  1:21 PM  ?Result Value Ref Range  ? Sodium 140 135 - 145 mmol/L  ? Potassium 3.7 3.5 - 5.1 mmol/L  ? Chloride 101 98 - 111 mmol/L  ? CO2 20 (L) 22 - 32 mmol/L  ? Glucose, Bld 95 70 - 99 mg/dL  ?  Comment: Glucose reference range applies only to samples taken after fasting for at least 8 hours.  ? BUN 84 (H) 8 - 23 mg/dL  ? Creatinine, Ser 13.00 (H) 0.61 - 1.24 mg/dL  ? Calcium 9.3 8.9 - 10.3 mg/dL  ? GFR, Estimated 4 (L) >60 mL/min  ?  Comment: (NOTE) ?Calculated using the CKD-EPI Creatinine Equation (2021) ?  ? Anion gap 19 (H) 5 - 15  ?  Comment: Performed at [facility], [place] [address], [place], [place] [address]  ?CBG monitoring, ED     Status: None  ? Collection Time: 09/27/21  4:40 PM  ?Result Value Ref Range  ? Glucose-Capillary 71 70 - 99 mg/dL  ?  Comment: Glucose reference range applies only to samples taken after fasting for at least 8 hours.  ?Urinalysis, Routine w reflex microscopic     Status: Abnormal  ? Collection Time: 09/27/21  5:13 PM  ?Result Value Ref Range  ? Color, Urine YELLOW YELLOW  ? APPearance CLOUDY (A) CLEAR  ? Specific Gravity, Urine 1.014 1.005 - 1.030  ? pH 5.0 5.0 - 8.0  ? Glucose, UA 150 (A) NEGATIVE mg/dL  ? Hgb urine dipstick MODERATE (A) NEGATIVE  ? Bilirubin Urine NEGATIVE NEGATIVE  ? Ketones, ur NEGATIVE NEGATIVE mg/dL  ? Protein, ur 100 (A) NEGATIVE mg/dL  ? Nitrite NEGATIVE NEGATIVE  ? Leukocytes,Ua NEGATIVE NEGATIVE  ? RBC / HPF 0-5 0 - 5 RBC/hpf  ? WBC, UA 0-5 0 - 5 WBC/hpf  ? Bacteria, UA RARE (A) NONE SEEN  ? Squamous Epithelial / LPF 0-5 0 - 5  ?  Mucus PRESENT   ? Amorphous  Crystal PRESENT   ?  Comment: Performed at [facility], [place] [address], [place], [place] [address]  ?Protein / creatinine ratio, urine     Status: Abnormal  ? Collection Time: 09/27/21  5:13 PM  ?Result Value Ref Range  ? Creatinine, Urine 153.66 mg/dL  ? Total Protein, Urine 212 mg/dL  ?  Comment: RESULTS CONFIRMED BY MANUAL DILUTION  ? Protein Creatinine Ratio 1.38 (H) 0.00 - 0.15 mg/mg[Cre]  ?  Comment: Performed at [facility], [place] [address] [place], [place] [address]  ?HIV Antibody (routine testing w rflx)     Status: Abnormal  ? Collection Time: 09/27/21  7:55 PM  ?Result Value Ref Range  ? HIV Screen 4th Generation wRfx NON REACTIVE (A) Non Reactive  ?  Comment: Performed at [facility], [place] [address], [place], [place] [address]  ?Renal function panel     Status: Abnormal  ? Collection Time: 09/28/21  2:36 AM  ?Result Value Ref Range  ? Sodium 141 135 - 145 mmol/L  ? Potassium 3.4 (L) 3.5 - 5.1 mmol/L  ? Chloride 109 98 - 111 mmol/L  ? CO2 11 (L) 22 - 32 mmol/L  ? Glucose, Bld 67 (L) 70 - 99 mg/dL  ?  Comment: Glucose reference range applies only to samples taken after fasting for at least 8 hours.  ? BUN 81 (H) 8 - 23 mg/dL  ? Creatinine, Ser 11.77 (H) 0.61 - 1.24 mg/dL  ? Calcium 7.3 (L) 8.9 - 10.3 mg/dL  ? Phosphorus 5.0 (H) 2.5 - 4.6 mg/dL  ? Albumin 2.5 (L) 3.5 - 5.0 g/dL  ? GFR, Estimated 4 (L) >60 mL/min  ?  Comment: (NOTE) ?Calculated using the CKD-EPI Creatinine Equation (2021) ?  ? Anion gap 21 (H) 5 - 15  ?  Comment: Electrolytes repeated to confirm. ?Performed at [facility], [place] [address], [place], [place] [address] ?  ?CBC     Status: Abnormal  ? Collection Time: 09/28/21  2:36 AM  ?Result Value Ref Range  ? WBC 13.3 (H) 4.0 - 10.5 K/uL  ? RBC 3.48 (L) 4.22 - 5.81 MIL/uL  ? Hemoglobin 9.7 (L) 13.0 - 17.0 g/dL  ? HCT 30.6 (L) 39.0 - 52.0 %  ? MCV 87.9 80.0 - 100.0 fL  ? MCH 27.9 26.0 - 34.0 pg  ? MCHC 31.7 30.0 - 36.0 g/dL  ? RDW 15.2  11.5 - 15.5 %  ? Platelets 253 150 - 400 K/uL  ? nRBC 0.0 0.0 - 0.2 %  ?  Comment: Performed at [facility], [place] [address] [place], [place] [address]  ?Hemoglobin A1c     Status: Abnormal  ? Col

## 2021-09-28 NOTE — Progress Notes (Signed)
Mobility Specialist Progress Note: ? ? 09/28/21 1558  ?Mobility  ?Activity Refused mobility  ? ?Pt laying in bed stating he doesn't want to walk right now. Will follow-up as time allows.  ? ?Kevin Soto ?Mobility Specialist ?Primary Phone [phone] ? ?

## 2021-09-29 ENCOUNTER — Inpatient Hospital Stay (HOSPITAL_COMMUNITY): Payer: Medicare Other

## 2021-09-29 DIAGNOSIS — N19 Unspecified kidney failure: Secondary | ICD-10-CM | POA: Diagnosis not present

## 2021-09-29 HISTORY — PX: IR FLUORO GUIDE CV LINE RIGHT: IMG2283

## 2021-09-29 HISTORY — PX: IR US GUIDE VASC ACCESS RIGHT: IMG2390

## 2021-09-29 LAB — CBC
HCT: 28.2 % — ABNORMAL LOW (ref 39.0–52.0)
Hemoglobin: 9.7 g/dL — ABNORMAL LOW (ref 13.0–17.0)
MCH: 28.4 pg (ref 26.0–34.0)
MCHC: 34.4 g/dL (ref 30.0–36.0)
MCV: 82.7 fL (ref 80.0–100.0)
Platelets: 252 10*3/uL (ref 150–400)
RBC: 3.41 MIL/uL — ABNORMAL LOW (ref 4.22–5.81)
RDW: 15 % (ref 11.5–15.5)
WBC: 13.8 10*3/uL — ABNORMAL HIGH (ref 4.0–10.5)
nRBC: 0 % (ref 0.0–0.2)

## 2021-09-29 LAB — KAPPA/LAMBDA LIGHT CHAINS
Kappa free light chain: 184.4 mg/L — ABNORMAL HIGH (ref 3.3–19.4)
Kappa, lambda light chain ratio: 2.16 — ABNORMAL HIGH (ref 0.26–1.65)
Lambda free light chains: 85.3 mg/L — ABNORMAL HIGH (ref 5.7–26.3)

## 2021-09-29 LAB — BASIC METABOLIC PANEL
Anion gap: 19 — ABNORMAL HIGH (ref 5–15)
BUN: 103 mg/dL — ABNORMAL HIGH (ref 8–23)
CO2: 17 mmol/L — ABNORMAL LOW (ref 22–32)
Calcium: 7.4 mg/dL — ABNORMAL LOW (ref 8.9–10.3)
Chloride: 101 mmol/L (ref 98–111)
Creatinine, Ser: 13.99 mg/dL — ABNORMAL HIGH (ref 0.61–1.24)
GFR, Estimated: 4 mL/min — ABNORMAL LOW (ref >60)
Glucose, Bld: 128 mg/dL — ABNORMAL HIGH (ref 70–99)
Potassium: 3.5 mmol/L (ref 3.5–5.1)
Sodium: 137 mmol/L (ref 135–145)

## 2021-09-29 LAB — GLUCOSE, CAPILLARY
Glucose-Capillary: 107 mg/dL — ABNORMAL HIGH (ref 70–99)
Glucose-Capillary: 147 mg/dL — ABNORMAL HIGH (ref 70–99)
Glucose-Capillary: 182 mg/dL — ABNORMAL HIGH (ref 70–99)
Glucose-Capillary: 138 mg/dL — ABNORMAL HIGH (ref 70–99)

## 2021-09-29 LAB — IRON AND TIBC
Iron: 71 ug/dL (ref 45–182)
Saturation Ratios: 27 % (ref 17.9–39.5)
TIBC: 259 ug/dL (ref 250–450)
UIBC: 188 ug/dL

## 2021-09-29 LAB — HEPATITIS B SURFACE ANTIBODY,QUALITATIVE: Hep B S Ab: NONREACTIVE

## 2021-09-29 LAB — PHOSPHORUS: Phosphorus: 6.5 mg/dL — ABNORMAL HIGH (ref 2.5–4.6)

## 2021-09-29 LAB — FERRITIN: Ferritin: 249 ng/mL (ref 24–336)

## 2021-09-29 LAB — HEPATITIS B SURFACE ANTIGEN: Hepatitis B Surface Ag: NONREACTIVE

## 2021-09-29 MED ORDER — CHLORHEXIDINE GLUCONATE CLOTH 2 % EX PADS
6.0000 | MEDICATED_PAD | Freq: Every day | CUTANEOUS | Status: DC
  Administered 2021-09-30 – 2021-10-01 (×2): 6 via TOPICAL

## 2021-09-29 MED ORDER — CEFAZOLIN SODIUM-DEXTROSE 2-4 GM/100ML-% IV SOLN
INTRAVENOUS | Status: AC | PRN
  Administered 2021-09-29: 2 g via INTRAVENOUS

## 2021-09-29 MED ORDER — GELATIN ABSORBABLE 12-7 MM EX MISC
CUTANEOUS | Status: AC
  Filled 2021-09-29: qty 1

## 2021-09-29 MED ORDER — HEPARIN SODIUM (PORCINE) 1000 UNIT/ML IJ SOLN
INTRAMUSCULAR | Status: AC
  Filled 2021-09-29: qty 10

## 2021-09-29 MED ORDER — MIDAZOLAM HCL 2 MG/2ML IJ SOLN
INTRAMUSCULAR | Status: AC | PRN
  Administered 2021-09-29: .5 mg via INTRAVENOUS

## 2021-09-29 MED ORDER — HEPARIN SODIUM (PORCINE) 1000 UNIT/ML IJ SOLN
INTRAMUSCULAR | Status: AC | PRN
  Administered 2021-09-29: 3800 [IU] via INTRAVENOUS

## 2021-09-29 MED ORDER — LIDOCAINE HCL 1 % IJ SOLN
INTRAMUSCULAR | Status: AC | PRN
  Administered 2021-09-29: 10 mL

## 2021-09-29 MED ORDER — FENTANYL CITRATE (PF) 100 MCG/2ML IJ SOLN
INTRAMUSCULAR | Status: AC
  Filled 2021-09-29: qty 2

## 2021-09-29 MED ORDER — LIDOCAINE HCL 1 % IJ SOLN
INTRAMUSCULAR | Status: AC
  Filled 2021-09-29: qty 20

## 2021-09-29 MED ORDER — SODIUM BICARBONATE 650 MG PO TABS
1300.0000 mg | ORAL_TABLET | Freq: Two times a day (BID) | ORAL | Status: DC
  Administered 2021-09-29 – 2021-09-30 (×2): 1300 mg via ORAL
  Filled 2021-09-29 (×2): qty 2

## 2021-09-29 MED ORDER — CEFAZOLIN SODIUM-DEXTROSE 2-4 GM/100ML-% IV SOLN
INTRAVENOUS | Status: AC
  Filled 2021-09-29: qty 100

## 2021-09-29 MED ORDER — MIDAZOLAM HCL 2 MG/2ML IJ SOLN
INTRAMUSCULAR | Status: AC
  Filled 2021-09-29: qty 2

## 2021-09-29 MED ORDER — FENTANYL CITRATE (PF) 100 MCG/2ML IJ SOLN
INTRAMUSCULAR | Status: AC | PRN
  Administered 2021-09-29: 25 ug via INTRAVENOUS

## 2021-09-29 NOTE — Progress Notes (Addendum)
Patient ID: Kevin Soto, male   DOB: Dec 28, 1960, 61 y.o.   MRN: [id number]    Progress Note   Subjective   Day # 2  CC; weakness, poor appetite, recent intermittent nausea and vomiting, outpatient labs found acute renal failure, question coffee-ground emesis  Aspirin/Plavix outpatient-Plavix on hold since yesterday  WBC 13.8/hemoglobin 9.7/hematocrit 28.2-stable K+3.5 BUN 103/13.99  Patient sitting up in the chair, says he feels a little bit better than yesterday, not hiccuping currently and has not had any further vomiting Says he had a brown stool this morning No complaints of abdominal pain   Objective   Vital signs in last 24 hours: Temp:  [97.6 F (36.4 C)-98.5 F (36.9 C)] 98.5 F (36.9 C) (05/12 1040) Pulse Rate:  [79-97] 79 (05/12 1040) Resp:  [17-18] 18 (05/12 1040) BP: (96-119)/(60-72) 119/72 (05/12 1040) SpO2:  [98 %-100 %] 100 % (05/12 1040) Last BM Date : 09/28/21 General:    Older African-American male in NAD Heart:  Regular rate and rhythm; no murmurs Lungs: Respirations even and unlabored, lungs CTA bilaterally Abdomen:  Soft, nontender and nondistended. Normal bowel sounds. Extremities:  Without edema. Neurologic:  Alert and oriented,  grossly normal neurologically. Psych:  Cooperative. Normal mood and affect.  Intake/Output from previous day: 05/11 0701 - 05/12 0700 In: 3167.2 [P.O.:600; I.V.:2567.2] Out: 450 [Urine:450] Intake/Output this shift: Total I/O In: 731.9 [I.V.:731.9] Out: -   Lab Results: Recent Labs    09/28/21 0236 09/28/21 1918 09/29/21 0236  WBC 13.3* 16.3* 13.8*  HGB 9.7* 10.4* 9.7*  HCT 30.6* 30.9* 28.2*  PLT 253 269 252   BMET Recent Labs    09/27/21 1321 09/28/21 0236 09/29/21 0236  NA 140 141 137  K 3.7 3.4* 3.5  CL 101 109 101  CO2 20* 11* 17*  GLUCOSE 95 67* 128*  BUN 84* 81* 103*  CREATININE 13.00* 11.77* 13.99*  CALCIUM 9.3 7.3* 7.4*   LFT Recent Labs    09/28/21 0236  ALBUMIN 2.5*    PT/INR No results for input(s): LABPROT, INR in the last 72 hours.  Studies/Results: CT ABDOMEN PELVIS WO CONTRAST  Result Date: 09/27/2021 CLINICAL DATA:  Kidney failure, acute EXAM: CT ABDOMEN AND PELVIS WITHOUT CONTRAST TECHNIQUE: Multidetector CT imaging of the abdomen and pelvis was performed following the standard protocol without IV contrast. RADIATION DOSE REDUCTION: This exam was performed according to the departmental dose-optimization program which includes automated exposure control, adjustment of the mA and/or kV according to patient size and/or use of iterative reconstruction technique. COMPARISON:  10/30/2016 FINDINGS: Lower chest: The visualized lung bases are clear. Extensive multi-vessel coronary artery calcification. Global cardiac size within normal limits. No pericardial effusion. Hepatobiliary: No focal liver abnormality is seen. No gallstones, gallbladder wall thickening, or biliary dilatation. Pancreas: Unremarkable Spleen: Unremarkable Adrenals/Urinary Tract: The adrenal glands are unremarkable. The kidneys are normal in size and position. Mild bilateral nonspecific perinephric stranding is stable since prior examination. No perinephric fluid collections. No hydronephrosis. No intrarenal or ureteral calculi. Foley catheter balloon is seen within a decompressed bladder lumen. Stomach/Bowel: Stomach is within normal limits. Appendix appears normal. No evidence of bowel wall thickening, distention, or inflammatory changes. Vascular/Lymphatic: Aortic atherosclerosis. No enlarged abdominal or pelvic lymph nodes. Reproductive: Brachytherapy seeds are seen within the prostate gland. Other: Small bilateral fat containing inguinal hernias and small fat containing umbilical hernia are present. Musculoskeletal: Degenerative changes seen within the lumbar spine. No acute bone abnormality. No lytic or blastic bone lesion. IMPRESSION: Stable nonspecific perinephric  stranding, likely the sequela  of remote inflammation. Otherwise unremarkable noncontrast examination of the kidneys and upper collecting system. Foley catheter decompression of the bladder lumen noted. Extensive multi-vessel coronary artery calcification. Aortic Atherosclerosis (ICD10-I70.0). Electronically Signed   By: Helyn Numbers M.D.   On: 09/27/2021 20:00   DG CHEST PORT 1 VIEW  Result Date: 09/27/2021 CLINICAL DATA:  Leukocytosis and renal failure. EXAM: PORTABLE CHEST 1 VIEW COMPARISON:  January 19, 2021 FINDINGS: Multiple overlying radiopaque cardiac lead wires are seen. The heart size and mediastinal contours are within normal limits. Both lungs are clear. The visualized skeletal structures are unremarkable. IMPRESSION: No active disease. Electronically Signed   By: Aram Candela M.D.   On: 09/27/2021 23:10   US THYROID  Result Date: 09/28/2021 CLINICAL DATA:  Palpable abnormality.  Thyromegaly EXAM: THYROID ULTRASOUND TECHNIQUE: Ultrasound examination of the thyroid gland and adjacent soft tissues was performed. COMPARISON:  None Available. FINDINGS: Parenchymal Echotexture: Mildly heterogenous Isthmus: 0.3 cm Right lobe: 3.6 x 1.8 x 1.9 cm Left lobe: 3.9 x 1.4 x 1.7 cm _________________________________________________________ Estimated total number of nodules >/= 1 cm: 0 Number of spongiform nodules >/=  2 cm not described below (TR1): 0 Number of mixed cystic and solid nodules >/= 1.5 cm not described below (TR2): 0 _________________________________________________________ No discrete nodules of consequence are seen within the thyroid gland. Incidental note is made of a small subcentimeter hypoechoic nodule in the left inferior gland. This nodule does not meet size criteria to warrant further follow-up. IMPRESSION: Mildly heterogeneous but normally sized thyroid gland. No thyroid nodules identified that would warrant further evaluation. The above is in keeping with the ACR TI-RADS recommendations - J Am Coll Radiol  2017;14:587-595. Electronically Signed   By: Malachy Moan M.D.   On: 09/28/2021 15:26       Assessment / Plan:    #79 61 year old African-American male admitted with acute renal failure, had not been feeling well over the past couple of weeks with decrease in appetite, intermittent nausea and vomiting.  Been on diuretic and ACE inhibitor at home  Nephrology following No improvement in renal function as yet  #2 concern for coffee-ground emesis/brown emesis He has not had any evidence of significant GI bleeding, no further emesis, and stool brown this morning Hemoglobin stable overnight His symptoms of nausea vomiting and lack of appetite as well as hiccups all consistent with uremia, may have some secondary reflux induced esophagitis or gastritis.  #3 baseline chronic kidney disease stage III #4.  Hypertension #5 dual antiplatelet therapy-aspirin and Plavix at home-Plavix on hold  #6 history of CVA #7.  Coronary artery disease status post remote stent #8 history of prostate CA status postradiation seed implant  #9  history of H. pylori induced gastritis and nonbleeding duodenal ulcer 2018-treated #10.  Diabetes mellitus  Plan; full liquid diet Continue around-the-clock Zofran every 8 hours IV PPI twice daily Continue to hold Plavix Trend hemoglobin and transfuse as indicated We will plan for EGD at some point this admission, would like Plavix to washout unless he has evidence of more overt bleeding over the next couple of days.       Principal Problem:   Uremia     LOS: 1 day   Amy Esterwood PA-C 09/29/2021, 10:45 AM  I have taken an interval history, thoroughly reviewed the chart and examined the patient. I agree with the Advanced Practitioner's note, impression and recommendations, and have recorded additional findings, impressions and recommendations below. I performed a substantive  portion of this encounter (>50% time spent), including a complete performance  of the medical decision making.  My additional thoughts are as follows:  No overt GI bleeding since the coffee-ground emesis before upon admission.  Hemoglobin has decreased, though more likely related to volume shifts rather than ongoing GI bleeding, especially with worsening renal function.  He will be getting a renal biopsy and then started on dialysis.  After that, he can begin a regular diet from my perspective and we will plan for an upper endoscopy prior to discharge after his renal function is improved.  We will check on him the day after tomorrow, please call sooner if urgent issues arise.  Charlie Pitter III Office:[phone]

## 2021-09-29 NOTE — Progress Notes (Signed)
Mobility Specialist Progress Note  ? ? 09/29/21 0926  ?Mobility  ?Activity Transferred from bed to chair  ?Level of Assistance Moderate assist, patient does 50-74%  ?Assistive Device Front wheel walker  ?Distance Ambulated (ft) 5 ft  ?Activity Response Tolerated fair  ?$Mobility charge 1 Mobility  ? ?Pt received in bed and agreeable. When sitting up, pt leaned back x2 c/o being a little lightheaded. Needed minA to sit up and verbal cueing for hand placement. Upon standing, started having diarrhea. Finished on [facility]. Left in chair with alarm on and call bell in reach. RN notified.  ? ?Hildred Alamin ?Mobility Specialist  ?Primary: 5N M.S. Phone: [phone] ?Secondary: 6N M.S. Phone: [phone] ?  ?

## 2021-09-29 NOTE — Progress Notes (Signed)
? ?Subjective: ?No acute overnight events.  ? ?Patient was seen at bedside during rounds today. Pt reports feeling well. He does not have any complaints at this time. Pt denies confusion, SHOB, chest pain, and abdominal pain. He is A&Ox3 and provides consistent answers and responses compared to yesterday, and appears to be more engaged in the conversation.  ? ?Pt is updated on the plan for today, and all questions and concerns are addressed.  ? ?Objective: ? ?Vital signs in last 24 hours: ?Vitals:  ? 09/28/21 0950 09/28/21 1647 09/28/21 2056 09/29/21 0511  ?BP: 120/72 102/60 101/62 96/64  ?Pulse: (!) 106 97 91 90  ?Resp: '18 18 18 17  '$ ?Temp: 98.1 ?F (36.7 ?C) 98.3 ?F (36.8 ?C) 98.4 ?F (36.9 ?C) 97.6 ?F (36.4 ?C)  ?TempSrc: Oral Oral Oral Oral  ?SpO2: 99% 99% 99% 98%  ?Weight:      ?Height:      ? ?General: Pleasant, well-appearing elderly man sitting in chair. No acute distress. ?HEENT: non-erythematous, mild soft tissue enlargement of his anterior neck. No cervical, submandibular, or supraclavicular lymphadenopathy present.  ?CV: Tachycardic. Regular rhythm. No murmurs, rubs, or gallops. No LE edema ?Pulmonary: Lungs CTAB. Normal effort. No wheezing or rales. ?Abdominal: Soft, nontender, nondistended. Normal bowel sounds. ?Skin: Warm and dry  ?Neuro: A&Ox3. Delayed response to questions, though improved from yesterday. Moves all extremities.  ?Psych: Normal mood and affect ? ?Assessment/Plan: ? ?Principal Problem: ?  Uremia ? ?Kevin Soto is a 61 y.o. male with a history of HTN, CAD, DM, prior CVA, ischemic cardiomyopathy, and CKD stage IIIB admitted for AKI on CKD3b. ? ?AKI on CKD3b, pre-renal versus intrarenal  ?Uremia ?Proteinuria  ?AGMA ?No improvement/ worsening renal function despite 2 days of aggressive volume resuscitation. BUN increased from 81 -> 103, Cr increased from 11.7 yesterday -> 14 today, and GFR still at 4. Bicarb improved from 11 -> 17 today. Mental status stable; denies confusion,  improved responses, and is A&O x 3. Patient's acute on chronic renal failure/uremia likely a combination of prerenal and intrinsic insults given poor PO intake, dry on exam during presentation and was on multiple nephrotoxic medications. Given rapidly progressive kidney failure and imaging without medical renal disease, and family hx of both parents with ESRD on HD, renal biopsy may be useful-- suggested to nephrology. UA with proteinuria and protein/Cr ratio elevated 1.38-- work up labs pending. UA with hematuria but no RBC's was concerning for rhabdomyolysis, but CK was 155. Overall, patient is at high risk of progressing to ESRD and suspect he will need HD very soon. ?--We will continue IV hydration with NS at 100 mL/h and trend renal function  ?--Nephrology following, follow up on recommendations  ?--Sodium bicarb ordered ?--Continue with foley catheter  ?--Follow up SPEP, UPEP, free light chains  ?--Holding HCTZ, lisinopril, Jardiance and metformin ?--Strict I&O's ?--Daily BMP ?--Called sister to provide an update, but unable to reach her--will try again.  ? ?Coffee ground emesis  ?Hx of normocytic anemia ?No evidence of significant GI bleeding, further emesis, or hematochezia or melena. Suspect gastritis/ upper GI bleed. Hgb slightly down from 10.4 yesterday -> 9.7 today (baseline 10-11) which is likely from hemodilution 2/2 IVF.  ?--GI following; plan for EGD this admission once he is more stable and after Plavix washout.  ?--Holding home ASA 81 mg and Plavix 75 mg daily in prep for EGD as above ?--Continue Protonix 40 mg IV BID  ?--Trend CBC and transfuse for Hgb <7 or hemodynamic instability ?  ?  Soft tissue enlargement  ?Non-erythematous, mild soft tissue enlargement of his anterior neck. No cervical, submandibular, or supraclavicular lymphadenopathy present. No recent weight loss. TSH wnl at 1.5. Thyroid US reassuring.  ? ?HTN ?HLD ?CAD ?Ischemic cardiomyopathy ?Hx of uncontrolled BP on multiple  antihypertensive regimen. BP 110/70's. No clinical signs of heart failure. Patient dry on exam. ?--Continue Coreg 25 mg BID ?--Holding lisinopril and HCTZ in setting of AKI  ?--Holding home ASA 81 mg and Plavix 75 mg daily in prep for EGD as above ?--Continue home Lipitor 80 mg daily ?  ?T2DM ?Relatively well controlled with 5/10 A1c 7.3%.   ?--SSI with meal, with CBG monitoring  ?--Hold metformin and Jardiance in setting of AKI  ?  ? ?Best Practice: ?Diet: Renal ?IVF: NS,100cc/hr ?VTE: SCDs ?Code: Full ? ? ?Lajean Manes, MD  ?Internal Medicine Resident, PGY-1 ?Pager: [phone] ?After 5pm on weekdays and 1pm on weekends: On Call pager [phone] ?

## 2021-09-29 NOTE — Procedures (Signed)
Interventional Radiology Procedure Note ? ?Procedure: Placement of a 23 cm RIGHT IJ Palindrome tunneled HD catheter.  ? ?Complications: None ? ?Estimated Blood Loss: None ? ?Recommendations: ?- Routine line care ? ? ?Signed, ? ?Criselda Peaches, MD ? ? ?

## 2021-09-29 NOTE — Consult Note (Signed)
? ?Chief Complaint: ?Worsening CKD with proteinuria. Request is for tunneled HD catheter.  ?  ?Referring Physician(s): ?Dr. Royce Macadamia ? ?Supervising Physician: Jacqulynn Cadet ? ?Patient Status: [facility] - In-pt ? ?History of Present Illness: ?Kevin Soto is a 61 y.o. male History of CKD, CAD s/p cardiac catheterization with PCI  2012. DM, HLD, alcohol abuse, HTN. Presented to the ED at [facility] from PMP with abnormal lab value (renal function) and with proteinuria. Team is requesting a tunneled catheter placement for dialysis access. ? ?Return precautions and treatment recommendations and follow-up discussed with the patient  who is agreeable with the plan. ? ?Past Medical History:  ?Diagnosis Date  ? Anemia   ? CAD (coronary artery disease)   ? cath 8/12:  LM ok, mLAD occluded with trivial collats from right AM and dRCA, mDx (small) 80%, RI 25%, tiny branch of OM 90%, mAM 30-40%, RCA sub-totally occluded, then 80%, EF 40%, inf HK.  He underwent PCI with [name] with placement of a Promus DES to the mRCA x 2  ? Chronic kidney disease   ? stage 3 per chart  ? Diabetes mellitus   ? Dyslipidemia   ? ED (erectile dysfunction)   ? Gait instability 02/27/2011  ? History of alcohol abuse   ? History of stomach ulcers   ? History of stroke   ? evidence of CVA on MRI in past  ? Hyperlipidemia   ? Hypertension   ? Ischemic cardiomyopathy   ? echo 7/12: EF 40-45%, septal, apical, inf basal HK, mod LVH, mild MR, mild LAE.  EF 55% by echo 2015  ? Macular hemorrhage   ? Prostate cancer ([place])   ? Sleep apnea   ? no c-pap; wife denies  ? Vision, loss, sudden 02/27/2011  ? ? ?Past Surgical History:  ?Procedure Laterality Date  ? CORONARY ANGIOPLASTY WITH STENT PLACEMENT  2012  ? 2 stents  ? RADIOACTIVE SEED IMPLANT N/A 05/08/2017  ? Procedure: RADIOACTIVE SEED IMPLANT/BRACHYTHERAPY IMPLANT;  Surgeon: Alexis Frock, MD;  Location: [facility];  Service: Urology;  Laterality: N/A;  ? [place] SURGERY  2012  ?  right and left eye/ per pt only left eye  ? SPACE OAR INSTILLATION N/A 05/08/2017  ? Procedure: SPACE OAR INSTILLATION;  Surgeon: Alexis Frock, MD;  Location: [facility];  Service: Urology;  Laterality: N/A;  ? ? ?Allergies: ?Clonidine derivatives ? ?Medications: ?Prior to Admission medications   ?Medication Sig Start Date End Date Taking? Authorizing Provider  ?aspirin EC 81 MG tablet Take 1 tablet (81 mg total) by mouth daily. 06/08/20  Yes Aslam, Loralyn Freshwater, MD  ?atorvastatin (LIPITOR) 80 MG tablet Take 1 tablet (80 mg total) by mouth daily. 07/22/20  Yes Minus Breeding, MD  ?carvedilol (COREG) 25 MG tablet TAKE 1 TABLET BY MOUTH  TWICE DAILY WITH A MEAL ?Patient taking differently: Take 25 mg by mouth 2 (two) times daily with a meal. 06/14/21  Yes Rehman, Areeg N, DO  ?clopidogrel (PLAVIX) 75 MG tablet Take 1 tablet (75 mg total) by mouth daily. 06/14/21  Yes Rehman, Areeg N, DO  ?dorzolamide-timolol (COSOPT) 22.3-6.8 MG/ML ophthalmic solution Place 1 drop into both eyes daily. 03/25/20  Yes [provider]  ?Dulaglutide (TRULICITY) 1.5 TM/1.9QQ SOPN INJECT SUBCUTANEOUSLY 1.5  MG INTO THE SKIN WEEKLY ?Patient taking differently: Inject 1.5 mg into the skin once a week. 06/08/20  Yes Aslam, Loralyn Freshwater, MD  ?Empagliflozin-metFORMIN HCl (SYNJARDY) 09-998 MG TABS Take 1 tablet by mouth 2 (two)  times daily. 06/08/20  Yes Aslam, Loralyn Freshwater, MD  ?fluticasone (FLONASE) 50 MCG/ACT nasal spray Place 2 sprays into both nostrils daily. 06/08/20  Yes Aslam, Loralyn Freshwater, MD  ?hydrochlorothiazide (HYDRODIURIL) 25 MG tablet TAKE 1 TABLET BY MOUTH  DAILY ?Patient taking differently: Take 25 mg by mouth daily. 06/12/21  Yes Iona Beard, MD  ?lisinopril (ZESTRIL) 30 MG tablet Take 30 mg by mouth daily.   Yes [provider]  ?nitroGLYCERIN (NITROSTAT) 0.4 MG SL tablet Place 1 tablet (0.4 mg total) under the tongue every 5 (five) minutes as needed. 06/08/20  Yes Aslam, Loralyn Freshwater, MD  ?rosuvastatin (CRESTOR) 40 MG tablet Take  40 mg by mouth daily.   Yes [provider]  ?tamsulosin (FLOMAX) 0.4 MG CAPS capsule Take 1 capsule (0.4 mg total) by mouth daily. 06/14/21  Yes Rehman, Areeg N, DO  ?Vitamin D, Ergocalciferol, (DRISDOL) 1.25 MG (50000 UNIT) CAPS capsule Take 50,000 Units by mouth every 7 (seven) days.   Yes [provider]  ?Accu-Chek FastClix Lancets MISC Use to check blood sugar up to 7 times a week 06/08/20   Harvie Heck, MD  ?Blood Glucose Monitoring Suppl (ONETOUCH VERIO REFLECT) w/Device KIT Check blood sugar 1 time a day 09/23/20   Riesa Pope, MD  ?glucose blood (ONETOUCH VERIO) test strip Check blood sugar 1 time a day 09/23/20   Riesa Pope, MD  ?Lancets [facility] DELICA PLUS [id number]) Golden blood sugar 1 time a day 09/23/20   Riesa Pope, MD  ?lisinopril (ZESTRIL) 20 MG tablet Take 1 tablet (20 mg total) by mouth daily. ?Patient not taking: Reported on 09/27/2021 06/08/20   Harvie Heck, MD  ?  ? ?Family History  ?Adopted: Yes  ?Problem Relation Age of Onset  ? Heart attack Mother   ?     Died 61  ? Heart failure Mother   ? Diabetes Mother   ? Heart attack Father   ? Heart failure Father   ? Diabetes Father   ? Prostate cancer Father   ? Cancer Father   ?     Prostate  ? Heart disease Sister   ?     s/p CABG x3 in her mid 35's  ? Crohn's disease Sister   ? Crohn's disease Other   ?     niece  ? Kidney disease Brother   ? ? ?Social History  ? ?Socioeconomic History  ? Marital status: Married  ?  Spouse name: Aleta  ? Number of children: 0  ? Years of education: Not on file  ? Highest education level: Not on file  ?Occupational History  ? Occupation: disabled  ?Tobacco Use  ? Smoking status: Never  ? Smokeless tobacco: Never  ?Vaping Use  ? Vaping Use: Never used  ?Substance and Sexual Activity  ? Alcohol use: Yes  ?  Alcohol/week: 4.0 standard drinks  ?  Types: 2 Cans of beer, 2 Shots of liquor per week  ? Drug use: No  ? Sexual activity: Not on file  ?Other Topics Concern  ?  Not on file  ?Social History Narrative  ? Lives in [place] with his cousinNo childrenExercises 3 times a week  ? Right handed   ? ?Social Determinants of Health  ? ?Financial Resource Strain: Not on file  ?Food Insecurity: Not on file  ?Transportation Needs: Not on file  ?Physical Activity: Not on file  ?Stress: Not on file  ?Social Connections: Not on file  ? ? ?Review of Systems: A 12 point ROS  discussed and pertinent positives are indicated in the HPI above.  All other systems are negative. ? ?Review of Systems  ?Constitutional:  Negative for fever.  ?HENT:  Negative for congestion.   ?Respiratory:  Negative for cough and shortness of breath.   ?Cardiovascular:  Negative for chest pain.  ?Gastrointestinal:  Negative for abdominal pain.  ?Neurological:  Negative for headaches.  ?Psychiatric/Behavioral:  Negative for behavioral problems and confusion.   ? ?Vital Signs: ?BP 119/72 (BP Location: Left Arm)   Pulse 79   Temp 98.5 ?F (36.9 ?C) (Oral)   Resp 18   Ht 6' (1.829 m)   Wt 215 lb (97.5 kg)   SpO2 100%   BMI 29.16 kg/m?  ? ?Physical Exam ?Vitals and nursing note reviewed.  ?Constitutional:   ?   Appearance: He is well-developed.  ?HENT:  ?   Head: Normocephalic.  ?Cardiovascular:  ?   Rate and Rhythm: Normal rate and regular rhythm.  ?   Heart sounds: Normal heart sounds.  ?Pulmonary:  ?   Effort: Pulmonary effort is normal.  ?   Breath sounds: Normal breath sounds.  ?Musculoskeletal:     ?   General: Normal range of motion.  ?   Cervical back: Normal range of motion.  ?Skin: ?   General: Skin is dry.  ?Neurological:  ?   Mental Status: He is alert and oriented to person, place, and time.  ? ? ?Imaging: ?CT ABDOMEN PELVIS WO CONTRAST ? ?Result Date: 09/27/2021 ?CLINICAL DATA:  Kidney failure, acute EXAM: CT ABDOMEN AND PELVIS WITHOUT CONTRAST TECHNIQUE: Multidetector CT imaging of the abdomen and pelvis was performed following the standard protocol without IV contrast. RADIATION DOSE REDUCTION: This  exam was performed according to the departmental dose-optimization program which includes automated exposure control, adjustment of the mA and/or kV according to patient size and/or use of iterative reconstruction

## 2021-09-29 NOTE — Progress Notes (Signed)
Kevin Soto ?Progress Note ? ?Name: Kevin Soto ?MRN: [id number] ?DOB: 03/18/1961 ? ?Chief Complaint:  ?Here for abnormal labs ? ?Subjective:  ?He had 0.5 L UOP over 5/11.  He has NS running at 100 ml/hr.   His scanned labs from 09/25/21 have BUN 78 and Cr 9.32. (His PCP directed him to the ER upon receipt of those labs).  He and I have discussed the risks/benefits/indications for dialysis.  He does consent for HD.  He and I have discussed the risks/benefits/indications for a renal biopsy and he does consent to proceed.  ? ?Review of systems:    ?Denies shortness of breath or chest pain  ?Denies n/v today - states this is the first morning he hasn't thrown up in the past couple of days ?--------------- ?Background on consult:  ?Kevin Soto is a 61 y.o. male with a history of HTN, CAD, DM, prior CVA, ischemic cardiomyopathy, and CKD stage III who presented to the hospital at the urging of his PCP, where he had gone for a routine labs.  He follows at [facility].  He does not have a nephrologist.  He states that he has been eating okay until recently.  He did have some nausea and has been throwing up in the morning the last couple of days.  He denies any shortness of breath or any abdominal pain.  He denies any diarrhea.  He states that food tastes normal.  He has not had as much of an appetite for about the past week.  He states that he has been urinating without difficulty and is making approximately normal amount of urine.  Note that the patient's home medications include hydrochlorothiazide, lisinopril, jardiance, and metformin.  He denies any new medications.  He has been on each of those particularly for a while.  He denies any Advil, Motrin, BC powders, Goody powders, alcohol also or other nonsteroidals.  He does take a baby aspirin daily for his heart.  He denies any family history of end-stage renal disease or chronic kidney disease.  His mother does have history of kidney stones.   ?  ?The patient understands that we are aggressively trying medical measures to improve his kidney function including hydration and holding medications.  If his renal function does not improve he would want dialysis if it were needed to save his life.  He and I have discussed the risks and benefits and indications for dialysis as well as risks of foregoing dialysis if it were to become indicated.  ? ?His scanned labs from 09/25/21 have BUN 78 and Cr 9.32.   ? ? ?Intake/Output Summary (Last 24 hours) at 09/29/2021 1245 ?Last data filed at 09/29/2021 0719 ?Gross per 24 hour  ?Intake 3899.09 ml  ?Output 450 ml  ?Net 3449.09 ml  ? ? ?Vitals:  ?Vitals:  ? 09/28/21 1647 09/28/21 2056 09/29/21 0511 09/29/21 1040  ?BP: 102/60 101/62 96/64 119/72  ?Pulse: 97 91 90 79  ?Resp: '18 18 17 18  '$ ?Temp: 98.3 ?F (36.8 ?C) 98.4 ?F (36.9 ?C) 97.6 ?F (36.4 ?C) 98.5 ?F (36.9 ?C)  ?TempSrc: Oral Oral Oral Oral  ?SpO2: 99% 99% 98% 100%  ?Weight:      ?Height:      ?  ? ?Physical Exam:   ?General: Adult male in stretcher in no acute distress at rest ?HEENT: Normocephalic atraumatic ?Eyes: Extraocular movements intact sclera anicteric ?Neck: Supple trachea midline ?Heart: S1-S2 no rub ?Lungs: Clear to auscultation bilaterally normal work of  breathing at rest on room air ?Abdomen: Soft nontender nondistended ?Extremities: No edema appreciated no cyanosis or clubbing ?Neuro: Alert and oriented x3 provides a history and follows commands ?Psych normal mood and affect ?Genitourinary Foley catheter in place with 150 ml urine ? ?Medications reviewed  ? ?Labs:  ? ?  Latest Ref Rng & Units 09/29/2021  ?  2:36 AM 09/28/2021  ?  2:36 AM 09/27/2021  ?  1:21 PM  ?BMP  ?Glucose 70 - 99 mg/dL 128   67   95    ?BUN 8 - 23 mg/dL 103   81   84    ?Creatinine 0.61 - 1.24 mg/dL 13.99   11.77   13.00    ?Sodium 135 - 145 mmol/L 137   141   140    ?Potassium 3.5 - 5.1 mmol/L 3.5   3.4   3.7    ?Chloride 98 - 111 mmol/L 101   109   101    ?CO2 22 - 32 mmol/L '17   11   20     '$ ?Calcium 8.9 - 10.3 mg/dL 7.4   7.3   9.3    ? ? ? ?Assessment/Plan:  ? ?# AKI  ?- Concern for multiple pre-renal insults in the setting of diuretic, ACE inhibitor use and nausea with vomiting.  Unfortanately no improvement with foley and fluids.  Intake UA with 100 mg/dL protein and 0-5 RBC.  CT a/p without hydro.  up/cr ratio 1380 mg/g ?- consult IR for tunneled catheter for hemodialysis; hoping to optimize him for a biopsy   ?- Hold his plavix please ?- he has been NPO since 9 (had coffee early am) ?- Please hold his home medications hydrochlorothiazide, lisinopril, jardiance, and metformin  ?- Ok to remove Foley; monitor for retention   ?- Continue NS for another 24 hours  ?- work-up for proteinuria as below.  With profound AKI not resolving with supportive measures will also send ANA, ANCA, complement ?  ?# Proteinuria  ?- May be 2/2 DM  ?- SPEP and free light chains pending.  Note random UPEP is send-out and code is [id number].  This is ordered but not yet sent.  Order to please go back and collect this ? ?# CKD stage 3 ?- likely 2/2 DM and HTN ?- Baseline near 1.8 and per the [facility] system he was last at baseline in February per labs available to me  ?- Note mild proteinuria for several years per lab history imported ?  ?# HTN  ?- hold hydrochlorothiazide and lisinopril. Unsure if he had hypotension at home when on these agents  ?- Controlled; avoid hypotension ? ?# Metabolic acidosis  ?- setting of AKI ?- increase oral bicarbonate ?  ?# DM type 2 with CKD  ?- hold home jardiance and metformin.   ?  ?# normocytic anemia  ?- has worsened a bit with hydration  ?- check iron stores ?  ?Disposition - please continue inpatient monitoring.  Starting HD and planning for renal biopsy ? ?Claudia Desanctis, MD ?09/29/2021 1:44 PM ? ? ? ? ?

## 2021-09-30 DIAGNOSIS — N19 Unspecified kidney failure: Secondary | ICD-10-CM | POA: Diagnosis not present

## 2021-09-30 LAB — CBC
HCT: 26.9 % — ABNORMAL LOW (ref 39.0–52.0)
Hemoglobin: 9.2 g/dL — ABNORMAL LOW (ref 13.0–17.0)
MCH: 28.4 pg (ref 26.0–34.0)
MCHC: 34.2 g/dL (ref 30.0–36.0)
MCV: 83 fL (ref 80.0–100.0)
Platelets: 221 10*3/uL (ref 150–400)
RBC: 3.24 MIL/uL — ABNORMAL LOW (ref 4.22–5.81)
RDW: 14.6 % (ref 11.5–15.5)
WBC: 10.7 10*3/uL — ABNORMAL HIGH (ref 4.0–10.5)
nRBC: 0 % (ref 0.0–0.2)

## 2021-09-30 LAB — BASIC METABOLIC PANEL
Anion gap: 16 — ABNORMAL HIGH (ref 5–15)
BUN: 103 mg/dL — ABNORMAL HIGH (ref 8–23)
CO2: 16 mmol/L — ABNORMAL LOW (ref 22–32)
Calcium: 6.8 mg/dL — ABNORMAL LOW (ref 8.9–10.3)
Chloride: 103 mmol/L (ref 98–111)
Creatinine, Ser: 14.28 mg/dL — ABNORMAL HIGH (ref 0.61–1.24)
GFR, Estimated: 3 mL/min — ABNORMAL LOW (ref >60)
Glucose, Bld: 137 mg/dL — ABNORMAL HIGH (ref 70–99)
Potassium: 3.3 mmol/L — ABNORMAL LOW (ref 3.5–5.1)
Sodium: 135 mmol/L (ref 135–145)

## 2021-09-30 LAB — RENAL FUNCTION PANEL
Albumin: 2.5 g/dL — ABNORMAL LOW (ref 3.5–5.0)
Anion gap: 14 (ref 5–15)
BUN: 100 mg/dL — ABNORMAL HIGH (ref 8–23)
CO2: 19 mmol/L — ABNORMAL LOW (ref 22–32)
Calcium: 6.9 mg/dL — ABNORMAL LOW (ref 8.9–10.3)
Chloride: 106 mmol/L (ref 98–111)
Creatinine, Ser: 14.37 mg/dL — ABNORMAL HIGH (ref 0.61–1.24)
GFR, Estimated: 3 mL/min — ABNORMAL LOW (ref >60)
Glucose, Bld: 129 mg/dL — ABNORMAL HIGH (ref 70–99)
Phosphorus: 5.2 mg/dL — ABNORMAL HIGH (ref 2.5–4.6)
Potassium: 3.4 mmol/L — ABNORMAL LOW (ref 3.5–5.1)
Sodium: 139 mmol/L (ref 135–145)

## 2021-09-30 LAB — ANA: Anti Nuclear Antibody (ANA): NEGATIVE

## 2021-09-30 LAB — C3 COMPLEMENT: C3 Complement: 106 mg/dL (ref 82–167)

## 2021-09-30 LAB — GLUCOSE, CAPILLARY
Glucose-Capillary: 105 mg/dL — ABNORMAL HIGH (ref 70–99)
Glucose-Capillary: 144 mg/dL — ABNORMAL HIGH (ref 70–99)
Glucose-Capillary: 138 mg/dL — ABNORMAL HIGH (ref 70–99)
Glucose-Capillary: 196 mg/dL — ABNORMAL HIGH (ref 70–99)

## 2021-09-30 LAB — C4 COMPLEMENT: Complement C4, Body Fluid: 28 mg/dL (ref 12–38)

## 2021-09-30 LAB — HEPATITIS B SURFACE ANTIBODY, QUANTITATIVE: Hep B S AB Quant (Post): <3.1 m[IU]/mL — ABNORMAL LOW (ref >9.9)

## 2021-09-30 MED ORDER — HEPARIN SODIUM (PORCINE) 1000 UNIT/ML IJ SOLN
INTRAMUSCULAR | Status: AC
  Filled 2021-09-30: qty 4

## 2021-09-30 NOTE — Progress Notes (Signed)
[facility] ?Progress Note ? ?Name: Kevin Soto ?MRN: [id number] ?DOB: 28-Feb-1961 ? ?Chief Complaint:  ?Here for abnormal labs ? ?Subjective:  ?He had 0.7 L UOP over 5/12.  He has NS running at 100 ml/hr.  He had a RIJ tunneled catheter placed for HD on 5/12 with IR.  Hasn't yet had HD but confirmed he is on schedule for today and is going next shift.  His parents were both on dialysis and he's not sure what caused their ESRD ? ?Review of systems:     ?Denies shortness of breath or chest pain  ?Denies n/v today - past couple of days have been better in that regard ?States he is urinating without difficulty ?--------------- ?Background on consult:  ?Kevin Soto is a 61 y.o. male with a history of HTN, CAD, DM, prior CVA, ischemic cardiomyopathy, and CKD stage III who presented to the hospital at the urging of his PCP, where he had gone for a routine labs.  He follows at [facility]  He does not have a nephrologist.  He states that he has been eating okay until recently.  He did have some nausea and has been throwing up in the morning the last couple of days.  He denies any shortness of breath or any abdominal pain.  He denies any diarrhea.  He states that food tastes normal.  He has not had as much of an appetite for about the past week.  He states that he has been urinating without difficulty and is making approximately normal amount of urine.  Note that the patient's home medications include hydrochlorothiazide, lisinopril, jardiance, and metformin.  He denies any new medications.  He has been on each of those particularly for a while.  He denies any Advil, Motrin, BC powders, Goody powders, alcohol also or other nonsteroidals.  He does take a baby aspirin daily for his heart.  He denies any family history of end-stage renal disease or chronic kidney disease.  His mother does have history of kidney stones.  ?  ?The patient understands that we are aggressively trying medical measures  to improve his kidney function including hydration and holding medications.  If his renal function does not improve he would want dialysis if it were needed to save his life.  He and I have discussed the risks and benefits and indications for dialysis as well as risks of foregoing dialysis if it were to become indicated.  ? ?His scanned labs from 09/25/21 have BUN 78 and Cr 9.32.   ? ? ?Intake/Output Summary (Last 24 hours) at 09/30/2021 1059 ?Last data filed at 09/29/2021 2345 ?Gross per 24 hour  ?Intake 100 ml  ?Output 701 ml  ?Net -601 ml  ? ? ?Vitals:  ?Vitals:  ? 09/29/21 1720 09/29/21 2056 09/30/21 0557 09/30/21 0740  ?BP: 106/63 (!) 148/91 115/76 116/72  ?Pulse: 87 84 80 83  ?Resp: '17 18 19 18  '$ ?Temp: 97.8 ?F (36.6 ?C) 97.9 ?F (36.6 ?C) 98 ?F (36.7 ?C) 98.1 ?F (36.7 ?C)  ?TempSrc:  Oral Oral Oral  ?SpO2: 100% 100% 100% 100%  ?Weight:      ?Height:      ?  ? ?Physical Exam:    ?General: Adult male in stretcher in no acute distress at rest ?HEENT: Normocephalic atraumatic ?Eyes: Extraocular movements intact sclera anicteric ?Neck: Supple trachea midline ?Heart: S1-S2 no rub ?Lungs: Clear to auscultation bilaterally normal work of breathing at rest on room air ?Abdomen: Soft nontender nondistended ?  Extremities: No edema appreciated no cyanosis or clubbing ?Neuro: Alert and oriented x3 provides a history and follows commands ?Psych normal mood and affect ?Genitourinary no foley urinal at bedside partially full  ?Access RIJ tunneled catheter in place ? ?Medications reviewed  ? ?Labs:  ? ?  Latest Ref Rng & Units 09/30/2021  ?  1:26 AM 09/29/2021  ?  2:36 AM 09/28/2021  ?  2:36 AM  ?BMP  ?Glucose 70 - 99 mg/dL 137   128   67    ?BUN 8 - 23 mg/dL 103   103   81    ?Creatinine 0.61 - 1.24 mg/dL 14.28   13.99   11.77    ?Sodium 135 - 145 mmol/L 135   137   141    ?Potassium 3.5 - 5.1 mmol/L 3.3   3.5   3.4    ?Chloride 98 - 111 mmol/L 103   101   109    ?CO2 22 - 32 mmol/L '16   17   11    '$ ?Calcium 8.9 - 10.3 mg/dL 6.8   7.4    7.3    ? ? ? ?Assessment/Plan:  ? ?# AKI  ?- Concern for multiple pre-renal insults in the setting of diuretic, ACE inhibitor use and nausea with vomiting.  Unfortanately no improvement with foley and fluids.  Intake UA with 100 mg/dL protein and 0-5 RBC.  CT a/p without hydro.  up/cr ratio 1380 mg/g. Complement normal  ?- consulted IR for renal biopsy - this is planned for 5/16 after plavix washout and may need to move to 5/17 ?- Hold his plavix please ?- HD today and on 5/15 to optimize prior to biopsy ?- He should remain off of his home home medications hydrochlorothiazide, lisinopril, jardiance, and metformin  ?- stopping normal saline   ?- work-up for proteinuria as below.  With profound AKI not resolving with supportive measures also sent ANA, ANCA which are both pending  ?  ?# Proteinuria  ?- May be 2/2 DM  ?- SPEP pending.  Free light chain ratio mildly elevated. random UPEP is pending  ? ?# CKD stage 3 ?- likely 2/2 DM and HTN ?- Baseline near 1.8 and per the [facility] system he was last at baseline in February per labs available to me  ?- Note mild proteinuria for several years per lab history imported ?  ?# HTN  ?- hold hydrochlorothiazide and lisinopril. Unsure if he had hypotension at home when on these agents  ?- Controlled; avoid hypotension ? ?# Metabolic acidosis  ?- setting of AKI ?- starting HD; stop oral bicarb ?  ?# DM type 2 with CKD  ?- hold home jardiance and metformin.   ?  ?# normocytic anemia  ?- has worsened a bit with hydration  ?- check iron stores; iron is replete  ?  ?Disposition - please continue inpatient monitoring.  Starting HD and planning for renal biopsy ? ?Claudia Desanctis, MD ?09/30/2021 11:18 AM ? ? ? ? ? ? ?

## 2021-09-30 NOTE — Progress Notes (Signed)
? ?Subjective: ?No acute overnight events.  ? ?Patient was seen at bedside during rounds today. He is sleeping in bed. Pt reports feeling well. He does not have any complaints at this time. Pt denies confusion, SHOB, chest pain, and abdominal pain. He is A&Ox3. He is less engaged in conversation today and appears more drowsy than yesterday.  ? ?Pt is updated on the plan for today, and all questions and concerns are addressed.  ? ?Objective: ? ?Vital signs in last 24 hours: ?Vitals:  ? 09/29/21 1720 09/29/21 2056 09/30/21 0557 09/30/21 0740  ?BP: 106/63 (!) 148/91 115/76 116/72  ?Pulse: 87 84 80 83  ?Resp: '17 18 19 18  '$ ?Temp: 97.8 ?F (36.6 ?C) 97.9 ?F (36.6 ?C) 98 ?F (36.7 ?C) 98.1 ?F (36.7 ?C)  ?TempSrc:  Oral Oral Oral  ?SpO2: 100% 100% 100% 100%  ?Weight:      ?Height:      ? ?General: sleepy and tired appearing elderly man laying in bed. No acute distress. ?CV: RRR. No murmurs, rubs, or gallops. No LE edema ?Pulmonary: Lungs CTAB. Normal effort. No wheezing or rales. ?Abdominal: Soft, nontender, nondistended. Normal bowel sounds. ?Skin: Warm and dry  ?Neuro: A&Ox3. Delayed response to questions as compared to yesterday. Moves all extremities.  ? ?Assessment/Plan: ? ?Principal Problem: ?  Uremia ? ?Kevin Soto is a 61 y.o. male with a history of HTN, CAD, DM, prior CVA, ischemic cardiomyopathy, and CKD stage IIIB admitted for AKI on CKD3b. ? ?AKI on CKD3b, pre-renal versus intrarenal  ?Uremia ?Proteinuria  ?AGMA ?No improvement/ worsening renal function despite 3 days of aggressive volume resuscitation. BUN stable at 103, and Cr trending up from 11.7 to 14 to 14.3 today. Bicarb dropped from 17 -> 16. Mental status not as well as yesterday; appears to be confused, has delayed responses, but is A&O x3. Given non-improving renal failure, HD cath placed yesterday with goal for HD very soon. Plan for kidney biopsy 5/15 given rapidly progressive kidney failure and imaging without medical renal disease, and family  hx of both parents having ESRD on HD. UA with proteinuria and protein/Cr ratio elevated 1.38-- work up labs pending.  ?--Nephrology following, follow up on recommendations  ?--Last day of IVF  ?--Sodium bicarb dose increased yesterday ?--Follow up SPEP and UPEP, complement, and ANCA profile ?--Holding HCTZ, lisinopril, Jardiance and metformin ?--Strict I&O's ?--Daily BMP ?--Called sister to provide an update, but unable to reach her--will try again.  ? ?Coffee ground emesis  ?Hx of normocytic anemia ?No evidence of significant GI bleeding, further emesis, or hematochezia or melena. Suspect gastritis/ upper GI bleed. Hgb slightly down from 9.7 -> 9.2 (baseline 10-11) which is likely from hemodilution 2/2 IVF. Iron studies were reassuring.  ?--GI following; plan for EGD this admission once he is more stable and after Plavix washout.  ?--Holding home ASA 81 mg and Plavix 75 mg daily in prep for EGD as above ?--Continue Protonix 40 mg IV BID  ?--Trend CBC and transfuse for Hgb <7 or hemodynamic instability ?  ?Soft tissue enlargement  ?Non-erythematous, mild soft tissue enlargement of his anterior neck. No cervical, submandibular, or supraclavicular lymphadenopathy present. No recent weight loss. TSH wnl at 1.5. [place] of thyroid and adjacent soft tissue was reassuring.  ? ?HTN ?HLD ?CAD ?Ischemic cardiomyopathy ?Hx of uncontrolled BP on multiple antihypertensive regimen. BP 110/70's. No clinical signs of heart failure. Patient dry on exam. ?--Continue Coreg 25 mg BID ?--Holding lisinopril and HCTZ in setting of AKI  ?--Holding  home ASA 81 mg and Plavix 75 mg daily in prep for EGD as above ?--Continue home Lipitor 80 mg daily ?  ?T2DM ?Relatively well controlled with 5/10 A1c 7.3%.   ?--SSI with meal, with CBG monitoring  ?--Hold metformin and Jardiance in setting of AKI  ?  ? ?Best Practice: ?Diet: Renal ?IVF: NS,100cc/hr ?VTE: SCDs ?Code: Full ? ? ?Lajean Manes, MD  ?Internal Medicine Resident, PGY-1 ?Pager:  [phone] ?After 5pm on weekdays and 1pm on weekends: On Call pager [phone] ?

## 2021-09-30 NOTE — Progress Notes (Signed)
Mobility Specialist Progress Note: ? ? 09/30/21 1205  ?Mobility  ?Activity Ambulated with assistance in room;Transferred from bed to chair  ?Level of Assistance Minimal assist, patient does 75% or more  ?Assistive Device  ?(HHA)  ?Distance Ambulated (ft) 4 ft  ?Activity Response Tolerated well  ?$Mobility charge 1 Mobility  ? ?Pt received in bed willing to participate in mobility. No complaints of pain. MinA to stand and step to recliner. Left in chair with call bell in reach and all needs met. Chair alarm on.  ? ?  ?Mobility Specialist ?Primary Phone [phone] ? ?

## 2021-09-30 NOTE — Progress Notes (Signed)
[facility] Progress Note ? ?Chief Complaint: Hematemesis ? ?History: ? ?He was seen with his family at the bedside.  He is sitting up in a chair eating lunch.  He has had no further hematemesis/coffee-ground emesis since the initial episode around the time of admission.  He has not had melena, his hemoglobin has remained relatively stable. ?Renal consult note reviewed, patient seems likely to need dialysis, he is also having a renal biopsy 3 days from now after Plavix washout.. ? ?ROS: ?Cardiovascular: He denies chest pain ?Respiratory: Denies dyspnea ? ? ?Objective: ? ? ?Current Facility-Administered Medications:  ?  acetaminophen (TYLENOL) tablet 650 mg, 650 mg, Oral, Q6H PRN **OR** acetaminophen (TYLENOL) suppository 650 mg, 650 mg, Rectal, Q6H PRN, Lacinda Axon, MD ?  atorvastatin (LIPITOR) tablet 80 mg, 80 mg, Oral, Daily, Lacinda Axon, MD, 80 mg at 09/30/21 0945 ?  carvedilol (COREG) tablet 25 mg, 25 mg, Oral, BID WC, Lacinda Axon, MD, 25 mg at 09/29/21 1746 ?  Chlorhexidine Gluconate Cloth 2 % PADS 6 each, 6 each, Topical, Q0600, Claudia Desanctis, MD, 6 each at 09/30/21 0557 ?  dorzolamide-timolol (COSOPT) 22.3-6.8 MG/ML ophthalmic solution 1 drop, 1 drop, Both Eyes, Daily, Lacinda Axon, MD, 1 drop at 09/30/21 0945 ?  fluticasone (FLONASE) 50 MCG/ACT nasal spray 2 spray, 2 spray, Each Nare, Daily, Lacinda Axon, MD, 2 spray at 09/30/21 0945 ?  insulin aspart (novoLOG) injection 0-6 Units, 0-6 Units, Subcutaneous, TID WC, Lajean Manes, MD, 1 Units at 09/29/21 1747 ?  ondansetron (ZOFRAN) tablet 4 mg, 4 mg, Oral, Q8H, 4 mg at 09/30/21 1341 **OR** ondansetron (ZOFRAN) injection 4 mg, 4 mg, Intravenous, Q8H, Esterwood, Amy S, PA-C, 4 mg at 09/29/21 0509 ?  pantoprazole (PROTONIX) injection 40 mg, 40 mg, Intravenous, Q12H, Idamae Schuller, MD, 40 mg at 09/30/21 0945 ?  senna-docusate (Senokot-S) tablet 1 tablet, 1 tablet, Oral, QHS PRN, Lacinda Axon, MD ?  tamsulosin [facility])  capsule 0.4 mg, 0.4 mg, Oral, Daily, Lacinda Axon, MD, 0.4 mg at 09/30/21 0946 ? ?  ? ?Vital signs in last 24 hrs: ?Vitals:  ? 09/30/21 0557 09/30/21 0740  ?BP: 115/76 116/72  ?Pulse: 80 83  ?Resp: 19 18  ?Temp: 98 ?F (36.7 ?C) 98.1 ?F (36.7 ?C)  ?SpO2: 100% 100%  ? ? ?Intake/Output Summary (Last 24 hours) at 09/30/2021 1405 ?Last data filed at 09/30/2021 1100 ?Gross per 24 hour  ?Intake 100 ml  ?Output 1021 ml  ?Net -921 ml  ? ? ? ?Physical Exam ?Alert and conversational, no acute distress. ?HEENT: sclera anicteric, oral mucosa without lesions ?Right IJ tunneled catheter ?Cardiac: RRR without murmurs, S1S2 heard, no  peripheral edema ?Pulm: clear to auscultation bilaterally, normal RR and effort noted ?Abdomen: soft, no tenderness, with active bowel sounds. No guarding or palpable hepatosplenomegaly ? ? ?Recent Labs: ? ? ?  Latest Ref Rng & Units 09/30/2021  ?  1:26 AM 09/29/2021  ?  2:36 AM 09/28/2021  ?  7:18 PM  ?CBC  ?WBC 4.0 - 10.5 K/uL 10.7   13.8   16.3    ?Hemoglobin 13.0 - 17.0 g/dL 9.2   9.7   10.4    ?Hematocrit 39.0 - 52.0 % 26.9   28.2   30.9    ?Platelets 150 - 400 K/uL 221   252   269    ? ? ?No results for input(s): INR in the last 168 hours. ? ?  Latest Ref Rng & Units 09/30/2021  ?  10:27 AM 09/30/2021  ?  1:26 AM 09/29/2021  ?  2:36 AM  ?CMP  ?Glucose 70 - 99 mg/dL 129   137   128    ?BUN 8 - 23 mg/dL 100   103   103    ?Creatinine 0.61 - 1.24 mg/dL 14.37   14.28   13.99    ?Sodium 135 - 145 mmol/L 139   135   137    ?Potassium 3.5 - 5.1 mmol/L 3.4   3.3   3.5    ?Chloride 98 - 111 mmol/L 106   103   101    ?CO2 22 - 32 mmol/L '19   16   17    '$ ?Calcium 8.9 - 10.3 mg/dL 6.9   6.8   7.4    ? ? ? ?Radiologic studies: ? ? ?Assessment & Plan  ?Assessment: ? ?Acute renal failure, not improving with treatment thus far.  Renal biopsy planned, likely dialysis soon. ? ?Small-volume coffee-ground emesis when vomiting from probable uremia early in the admission.  No further episodes of that, no melena or other  evidence of overt GI bleeding. ? ?On Plavix until time of admission ? ?Plan: ? ?Original consult plan was for an upper endoscopy prior to discharge, pending improvement in his renal function.  He would need that for stability of electrolyte and acid-base status as well as decrease chance of uremic platelet dysfunction should any biopsies or other intervention be needed. ?EGD is not urgent since he has no overt GI bleeding at present. ? ?Since he is stable from a GI bleeding standpoint right now we will sign off, but I would like the medical service to call us prior to discharge when his renal function has improved and he appears ready for upper endoscopy. ? ?Nelida Meuse III ?Office: [phone] ? ?

## 2021-10-01 LAB — GLUCOSE, CAPILLARY
Glucose-Capillary: 121 mg/dL — ABNORMAL HIGH (ref 70–99)
Glucose-Capillary: 187 mg/dL — ABNORMAL HIGH (ref 70–99)
Glucose-Capillary: 166 mg/dL — ABNORMAL HIGH (ref 70–99)
Glucose-Capillary: 157 mg/dL — ABNORMAL HIGH (ref 70–99)

## 2021-10-01 LAB — CBC
HCT: 25.9 % — ABNORMAL LOW (ref 39.0–52.0)
Hemoglobin: 9.1 g/dL — ABNORMAL LOW (ref 13.0–17.0)
MCH: 28 pg (ref 26.0–34.0)
MCHC: 35.1 g/dL (ref 30.0–36.0)
MCV: 79.7 fL — ABNORMAL LOW (ref 80.0–100.0)
Platelets: 177 10*3/uL (ref 150–400)
RBC: 3.25 MIL/uL — ABNORMAL LOW (ref 4.22–5.81)
RDW: 14.3 % (ref 11.5–15.5)
WBC: 8.7 10*3/uL (ref 4.0–10.5)
nRBC: 0 % (ref 0.0–0.2)

## 2021-10-01 LAB — BASIC METABOLIC PANEL
Anion gap: 11 (ref 5–15)
BUN: 64 mg/dL — ABNORMAL HIGH (ref 8–23)
CO2: 24 mmol/L (ref 22–32)
Calcium: 7 mg/dL — ABNORMAL LOW (ref 8.9–10.3)
Chloride: 103 mmol/L (ref 98–111)
Creatinine, Ser: 10.81 mg/dL — ABNORMAL HIGH (ref 0.61–1.24)
GFR, Estimated: 5 mL/min — ABNORMAL LOW (ref >60)
Glucose, Bld: 120 mg/dL — ABNORMAL HIGH (ref 70–99)
Potassium: 2.8 mmol/L — ABNORMAL LOW (ref 3.5–5.1)
Sodium: 138 mmol/L (ref 135–145)

## 2021-10-01 LAB — POTASSIUM: Potassium: 3.3 mmol/L — ABNORMAL LOW (ref 3.5–5.1)

## 2021-10-01 MED ORDER — PENTAFLUOROPROP-TETRAFLUOROETH EX AERO: 1.0000 "application " | INHALATION_SPRAY | CUTANEOUS | Status: DC | PRN

## 2021-10-01 MED ORDER — CHLORHEXIDINE GLUCONATE CLOTH 2 % EX PADS
6.0000 | MEDICATED_PAD | Freq: Every day | CUTANEOUS | Status: DC
  Administered 2021-10-02 – 2021-10-04 (×3): 6 via TOPICAL

## 2021-10-01 MED ORDER — HEPARIN SODIUM (PORCINE) 1000 UNIT/ML DIALYSIS: 1000.0000 [IU] | INTRAMUSCULAR | Status: DC | PRN

## 2021-10-01 MED ORDER — POTASSIUM CHLORIDE CRYS ER 20 MEQ PO TBCR
20.0000 meq | EXTENDED_RELEASE_TABLET | Freq: Once | ORAL | Status: AC
  Administered 2021-10-01: 20 meq via ORAL
  Filled 2021-10-01: qty 1

## 2021-10-01 MED ORDER — LIDOCAINE HCL (PF) 1 % IJ SOLN: 5.0000 mL | INTRAMUSCULAR | Status: DC | PRN

## 2021-10-01 MED ORDER — POTASSIUM CHLORIDE CRYS ER 20 MEQ PO TBCR
40.0000 meq | EXTENDED_RELEASE_TABLET | Freq: Once | ORAL | Status: AC
  Administered 2021-10-01: 40 meq via ORAL
  Filled 2021-10-01: qty 2

## 2021-10-01 MED ORDER — SODIUM CHLORIDE 0.9 % IV SOLN
100.0000 mL | INTRAVENOUS | Status: DC | PRN
Start: 2021-10-01 — End: 2021-10-05

## 2021-10-01 MED ORDER — ALTEPLASE 2 MG IJ SOLR: 2.0000 mg | Freq: Once | INTRAMUSCULAR | Status: DC | PRN

## 2021-10-01 MED ORDER — SODIUM CHLORIDE 0.9 % IV SOLN: 100.0000 mL | INTRAVENOUS | Status: DC | PRN

## 2021-10-01 MED ORDER — PANTOPRAZOLE SODIUM 40 MG PO TBEC
40.0000 mg | DELAYED_RELEASE_TABLET | Freq: Two times a day (BID) | ORAL | Status: DC
  Administered 2021-10-01 – 2021-10-04 (×8): 40 mg via ORAL
  Filled 2021-10-01 (×8): qty 1

## 2021-10-01 MED ORDER — LIDOCAINE-PRILOCAINE 2.5-2.5 % EX CREA: 1.0000 "application " | TOPICAL_CREAM | CUTANEOUS | Status: DC | PRN

## 2021-10-01 NOTE — Plan of Care (Signed)
  Problem: Nutrition: Goal: Adequate nutrition will be maintained Outcome: Progressing   Problem: Pain Managment: Goal: General experience of comfort will improve Outcome: Progressing   Problem: Safety: Goal: Ability to remain free from injury will improve Outcome: Progressing   

## 2021-10-01 NOTE — Progress Notes (Signed)
Mobility Specialist Progress Note: ? ? 10/01/21 1159  ?Mobility  ?Activity Ambulated with assistance in room;Transferred from bed to chair  ?Level of Assistance Contact guard assist, steadying assist  ?Assistive Device Front wheel walker  ?Distance Ambulated (ft) 4 ft  ?Activity Response Tolerated well  ?$Mobility charge 1 Mobility  ? ?Pt received in bed willing to participate in mobility. No complaints of pain. Left in chair with call bell in reach and all needs met.  ? ?Cortina Vultaggio ?Mobility Specialist ?Primary Phone [phone] ? ?

## 2021-10-01 NOTE — Progress Notes (Signed)
Mobility Specialist Progress Note: ? ? 10/01/21 1431  ?Mobility  ?Activity Ambulated with assistance in room;Transferred from chair to bed  ?Level of Assistance Minimal assist, patient does 75% or more  ?Assistive Device Front wheel walker  ?Distance Ambulated (ft) 4 ft  ?Activity Response Tolerated well  ?$Mobility charge 1 Mobility  ? ?Pt received in chair wanting to go back to bed. No complaints of pain. Left in bed with call bell in reach and all needs met.  ? ?  ?Mobility Specialist ?Primary Phone [phone] ? ?

## 2021-10-01 NOTE — H&P (Signed)
? ?Chief Complaint: ?Patient was seen in consultation today for  ?Chief Complaint  ?Patient presents with  ? Abnormal Lab  ? ? ?Referring Physician(s): Dr. Royce Macadamia ? ?Supervising Physician: Jacqulynn Cadet ? ?Patient Status: [facility] - In-pt ? ?History of Present Illness: ?Kevin Soto is a 61 y.o. male with a medical history significant for CAD, DM, ischemic cardiomyopathy, HTN and chronic kidney disease. He presented to the [facility] ED 09/27/21 after lab work obtained by his PCP showed abnormal renal function (Creatinine 13) and proteinuria. Nephrology was consulted and the patient ultimately required hemodialysis. He was seen in IR 09/29/21 for placement of a tunneled dialysis catheter.  ? ?The nephrology team is now requesting a non-focal renal biopsy for further work up.  ? ?Past Medical History:  ?Diagnosis Date  ? Anemia   ? CAD (coronary artery disease)   ? cath 8/12:  LM ok, mLAD occluded with trivial collats from right AM and dRCA, mDx (small) 80%, RI 25%, tiny branch of OM 90%, mAM 30-40%, RCA sub-totally occluded, then 80%, EF 40%, inf HK.  He underwent PCI with [name] with placement of a Promus DES to the mRCA x 2  ? Chronic kidney disease   ? stage 3 per chart  ? Diabetes mellitus   ? Dyslipidemia   ? ED (erectile dysfunction)   ? Gait instability 02/27/2011  ? History of alcohol abuse   ? History of stomach ulcers   ? History of stroke   ? evidence of CVA on MRI in past  ? Hyperlipidemia   ? Hypertension   ? Ischemic cardiomyopathy   ? echo 7/12: EF 40-45%, septal, apical, inf basal HK, mod LVH, mild MR, mild LAE.  EF 55% by echo 2015  ? Macular hemorrhage   ? Prostate cancer ([place])   ? Sleep apnea   ? no c-pap; wife denies  ? Vision, loss, sudden 02/27/2011  ? ? ?Past Surgical History:  ?Procedure Laterality Date  ? CORONARY ANGIOPLASTY WITH STENT PLACEMENT  2012  ? 2 stents  ? IR FLUORO GUIDE CV LINE RIGHT  09/29/2021  ? IR US GUIDE VASC ACCESS RIGHT  09/29/2021  ? RADIOACTIVE SEED IMPLANT N/A  05/08/2017  ? Procedure: RADIOACTIVE SEED IMPLANT/BRACHYTHERAPY IMPLANT;  Surgeon: Alexis Frock, MD;  Location: [facility];  Service: Urology;  Laterality: N/A;  ? [place] SURGERY  2012  ? right and left eye/ per pt only left eye  ? SPACE OAR INSTILLATION N/A 05/08/2017  ? Procedure: SPACE OAR INSTILLATION;  Surgeon: Alexis Frock, MD;  Location: [facility]  Service: Urology;  Laterality: N/A;  ? ? ?Allergies: ?Clonidine derivatives ? ?Medications: ?Prior to Admission medications   ?Medication Sig Start Date End Date Taking? Authorizing Provider  ?aspirin EC 81 MG tablet Take 1 tablet (81 mg total) by mouth daily. 06/08/20  Yes Aslam, Loralyn Freshwater, MD  ?atorvastatin (LIPITOR) 80 MG tablet Take 1 tablet (80 mg total) by mouth daily. 07/22/20  Yes Minus Breeding, MD  ?carvedilol (COREG) 25 MG tablet TAKE 1 TABLET BY MOUTH  TWICE DAILY WITH A MEAL ?Patient taking differently: Take 25 mg by mouth 2 (two) times daily with a meal. 06/14/21  Yes Rehman, Areeg N, DO  ?clopidogrel (PLAVIX) 75 MG tablet Take 1 tablet (75 mg total) by mouth daily. 06/14/21  Yes Rehman, Areeg N, DO  ?dorzolamide-timolol (COSOPT) 22.3-6.8 MG/ML ophthalmic solution Place 1 drop into both eyes daily. 03/25/20  Yes [provider]  ?Dulaglutide (TRULICITY) 1.5  MG/0.5ML SOPN INJECT SUBCUTANEOUSLY 1.5  MG INTO THE SKIN WEEKLY ?Patient taking differently: Inject 1.5 mg into the skin once a week. 06/08/20  Yes Aslam, Loralyn Freshwater, MD  ?Empagliflozin-metFORMIN HCl (SYNJARDY) 09-998 MG TABS Take 1 tablet by mouth 2 (two) times daily. 06/08/20  Yes Aslam, Loralyn Freshwater, MD  ?fluticasone (FLONASE) 50 MCG/ACT nasal spray Place 2 sprays into both nostrils daily. 06/08/20  Yes Aslam, Loralyn Freshwater, MD  ?hydrochlorothiazide (HYDRODIURIL) 25 MG tablet TAKE 1 TABLET BY MOUTH  DAILY ?Patient taking differently: Take 25 mg by mouth daily. 06/12/21  Yes Iona Beard, MD  ?lisinopril (ZESTRIL) 30 MG tablet Take 30 mg by mouth daily.   Yes [provider]  ?nitroGLYCERIN (NITROSTAT) 0.4 MG SL tablet Place 1 tablet (0.4 mg total) under the tongue every 5 (five) minutes as needed. 06/08/20  Yes Aslam, Loralyn Freshwater, MD  ?rosuvastatin (CRESTOR) 40 MG tablet Take 40 mg by mouth daily.   Yes [provider]  ?tamsulosin (FLOMAX) 0.4 MG CAPS capsule Take 1 capsule (0.4 mg total) by mouth daily. 06/14/21  Yes Rehman, Areeg N, DO  ?Vitamin D, Ergocalciferol, (DRISDOL) 1.25 MG (50000 UNIT) CAPS capsule Take 50,000 Units by mouth every 7 (seven) days.   Yes [provider]  ?Accu-Chek FastClix Lancets MISC Use to check blood sugar up to 7 times a week 06/08/20   Harvie Heck, MD  ?Blood Glucose Monitoring Suppl (ONETOUCH VERIO REFLECT) w/Device KIT Check blood sugar 1 time a day 09/23/20   Riesa Pope, MD  ?glucose blood (ONETOUCH VERIO) test strip Check blood sugar 1 time a day 09/23/20   Riesa Pope, MD  ?Lancets [facility] DELICA PLUS [id number]) [place] blood sugar 1 time a day 09/23/20   Riesa Pope, MD  ?lisinopril (ZESTRIL) 20 MG tablet Take 1 tablet (20 mg total) by mouth daily. ?Patient not taking: Reported on 09/27/2021 06/08/20   Harvie Heck, MD  ?  ? ?Family History  ?Adopted: Yes  ?Problem Relation Age of Onset  ? Heart attack Mother   ?     Died 61  ? Heart failure Mother   ? Diabetes Mother   ? Heart attack Father   ? Heart failure Father   ? Diabetes Father   ? Prostate cancer Father   ? Cancer Father   ?     Prostate  ? Heart disease Sister   ?     s/p CABG x3 in her mid 36's  ? Crohn's disease Sister   ? Crohn's disease Other   ?     niece  ? Kidney disease Brother   ? ? ?Social History  ? ?Socioeconomic History  ? Marital status: Married  ?  Spouse name: Aleta  ? Number of children: 0  ? Years of education: Not on file  ? Highest education level: Not on file  ?Occupational History  ? Occupation: disabled  ?Tobacco Use  ? Smoking status: Never  ? Smokeless tobacco: Never  ?Vaping Use  ? Vaping Use: Never used   ?Substance and Sexual Activity  ? Alcohol use: Yes  ?  Alcohol/week: 4.0 standard drinks  ?  Types: 2 Cans of beer, 2 Shots of liquor per week  ? Drug use: No  ? Sexual activity: Not on file  ?Other Topics Concern  ? Not on file  ?Social History Narrative  ? Lives in [place] with his cousinNo childrenExercises 3 times a week  ? Right handed   ? ?Social Determinants of Health  ? ?Emergency planning/management officer  Strain: Not on file  ?Food Insecurity: Not on file  ?Transportation Needs: Not on file  ?Physical Activity: Not on file  ?Stress: Not on file  ?Social Connections: Not on file  ? ? ?Review of Systems: A 12 point ROS discussed and pertinent positives are indicated in the HPI above.  All other systems are negative. ? ?Review of Systems  ?Constitutional:  Negative for appetite change and fatigue.  ?Respiratory:  Negative for cough and shortness of breath.   ?Cardiovascular:  Negative for chest pain and leg swelling.  ?Gastrointestinal:  Negative for abdominal pain, diarrhea, nausea and vomiting.  ?Neurological:  Negative for headaches.  ? ?Vital Signs: ?BP 140/76 (BP Location: Right Arm)   Pulse 80   Temp 97.8 ?F (36.6 ?C) (Axillary)   Resp 18   Ht 6' (1.829 m)   Wt 215 lb (97.5 kg)   SpO2 100%   BMI 29.16 kg/m?  ? ?Physical Exam ?Constitutional:   ?   General: He is not in acute distress. ?   Appearance: He is not ill-appearing.  ?HENT:  ?   Mouth/Throat:  ?   Mouth: Mucous membranes are moist.  ?   Pharynx: Oropharynx is clear.  ?Cardiovascular:  ?   Rate and Rhythm: Normal rate and regular rhythm.  ?   Pulses: Normal pulses.  ?   Heart sounds: Normal heart sounds.  ?   Comments: RIJ tunneled dialysis catheter. Site is clean/dry.  ?Pulmonary:  ?   Effort: Pulmonary effort is normal.  ?   Breath sounds: Normal breath sounds.  ?Abdominal:  ?   General: Bowel sounds are normal.  ?   Palpations: Abdomen is soft.  ?Skin: ?   General: Skin is warm and dry.  ?Neurological:  ?   Mental Status: He is alert and oriented to  person, place, and time.  ? ? ?Imaging: ?CT ABDOMEN PELVIS WO CONTRAST ? ?Result Date: 09/27/2021 ?CLINICAL DATA:  Kidney failure, acute EXAM: CT ABDOMEN AND PELVIS WITHOUT CONTRAST TECHNIQUE: Multidetector CT imag

## 2021-10-01 NOTE — Progress Notes (Signed)
? ?Subjective:  ? ?Hospital day:4 ? ?Overnight event:Received HD yesterday ? ?Interim History: Patient evaluated at bedside laying comfortably in bed. States he received HD yesterday afternoon for about 3 hours. He feels fine and denies any shortness of breath, nausea, vomiting, fever, chills, chest pain, abdominal pain palpitations ? ?Objective: ? ?Vital signs in last 24 hours: ?Vitals:  ? 09/30/21 1730 09/30/21 1835 09/30/21 2054 10/01/21 0520  ?BP: 99/70 130/66 132/82 123/75  ?Pulse: 76 80 89 84  ?Resp: '19 18 19 17  '$ ?Temp:  98.1 ?F (36.7 ?C) 97.8 ?F (36.6 ?C) 98.5 ?F (36.9 ?C)  ?TempSrc:  Oral Oral Oral  ?SpO2: 100% 100% 98% 100%  ?Weight:      ?Height:      ? ? ?Filed Weights  ? 09/27/21 1643  ?Weight: 97.5 kg  ? ? ? ?Intake/Output Summary (Last 24 hours) at 10/01/2021 0812 ?Last data filed at 10/01/2021 0520 ?Gross per 24 hour  ?Intake 240 ml  ?Output 1470 ml  ?Net -1230 ml  ? ?Net IO Since Admission: 1,618.09 mL [10/01/21 0812] ? ?Recent Labs  ?  09/30/21 ?1843 09/30/21 ?2052 10/01/21 ?0810  ?GLUCAP 138* 196* 121*  ?  ? ?Pertinent Labs: ? ?  Latest Ref Rng & Units 10/01/2021  ?  2:16 AM 09/30/2021  ?  1:26 AM 09/29/2021  ?  2:36 AM  ?CBC  ?WBC 4.0 - 10.5 K/uL 8.7   10.7   13.8    ?Hemoglobin 13.0 - 17.0 g/dL 9.1   9.2   9.7    ?Hematocrit 39.0 - 52.0 % 25.9   26.9   28.2    ?Platelets 150 - 400 K/uL 177   221   252    ? ? ? ?  Latest Ref Rng & Units 10/01/2021  ?  2:16 AM 09/30/2021  ? 10:27 AM 09/30/2021  ?  1:26 AM  ?CMP  ?Glucose 70 - 99 mg/dL 120   129   137    ?BUN 8 - 23 mg/dL 64   100   103    ?Creatinine 0.61 - 1.24 mg/dL 10.81   14.37   14.28    ?Sodium 135 - 145 mmol/L 138   139   135    ?Potassium 3.5 - 5.1 mmol/L 2.8   3.4   3.3    ?Chloride 98 - 111 mmol/L 103   106   103    ?CO2 22 - 32 mmol/L '24   19   16    '$ ?Calcium 8.9 - 10.3 mg/dL 7.0   6.9   6.8    ? ? ?Imaging: ?No results found. ? ?Physical Exam ? ?General: Pleasant, well-appearing elderly man laying in bed. No acute distress. ?Neck: R IJ  tunneled HD catheter in place w/ small area of erythema near R clavicle.  ?CV: RRR. No m/r/g. No LE edema ?Pulmonary: Lungs CTAB. Normal effort. No wheezing or rales. ?Abdominal: Soft, nontender, nondistended. Normal bowel sounds. ?Extremities: 2+ distal pulses. Normal ROM. ?Skin: Warm and dry. No obvious rash or lesions. ?Neuro: A&Ox3. Moves all extremities. Normal sensation to gross touch.  ?Psych: Normal mood and affect ? ? ?Assessment/Plan: ?URIAN MARTENSON is a 61 y.o. male with hx of HTN, CAD, DM, prior CVA, ischemic cardiomyopathy, and CKD stage IIIB admitted for AKI on CKD3b.  Status post first HD 5/13. ? ?Principal Problem: ?  Uremia ? ?AKI on CKD3b, pre-renal versus intrarenal  ?Uremia, improved ?Proteinuria  ?AGMA, resolved ?Hypokalemia ?Patient receive HD yesterday  with significant improvement in mental status this morning.  Kidney function also improved this morning with BUN down from 100->64, and sCr down from 14->10. Anion gap metabolic acidosis has resolved. Potassium down to 2.8. C3 and C4 within normal limits. Hepatitis B surface antibody result shows that patient is currently not immune. Patient would likely require hepatitis B immunization prior to discharge. ?--Nephrology following, appreciate recs ? -- Plan for HD on 5/15 and 5/16 ? -- KCl 40 mEq p.o. x1, repeat K at 1500 ?-- Pending ANCA studies, SPEP and UPEP ?--Kidney biopsy planned for 5/17, pending Plavix washout and patient optimization. ?--Avoid nephrotoxic agents ?--Continue to holding HCTZ, lisinopril, Jardiance and metformin ? ?Coffee ground emesis  ?Hx of normocytic anemia ?Patient has not had any emesis since admission.  No evidence of active GI bleed.  Hemoglobin of 9.1 today (baseline 10-11). Patient remains hemodynamically stable.  Per GI, coffee-ground emesis likely from probable uremia. Patient will likely need an EGD during this hospitalization but currently not urgent. ?--GI signed off for now but would like to be reengage  once patient's renal function improves ?--Continue Protonix 40 mg twice daily ?--Continue to hold ASA and Plavix ?--Daily CBC ?--Continue to hold ?  ?HTN ?HLD ?CAD ?Ischemic cardiomyopathy ?Hx of uncontrolled BP on multiple antihypertensive regimen.  Blood pressure stable with SBP in the 120s to 140s.  Patient euvolemic on exam. ?--Continue Coreg 25 mg BID ?--Holding lisinopril and HCTZ in setting of AKI  ?--Holding home ASA 81 mg and Plavix 75 mg daily in prep for EGD as above ?--Continue home Lipitor 80 mg daily ?  ?T2DM ?Relatively well controlled with 5/10 A1c 7.3%.   ?--SSI with meal, with CBG monitoring  ?--Hold metformin and Jardiance in setting of AKI  ? ?Best Practice: ?Diet: Renal ?IVF: N/A ?VTE: SCDs ?Code: Full ? ?Prior to Admission Living Arrangement: Home ?Anticipated Discharge Location: Pending ?Barriers to Discharge: Medical stability ?Dispo: Anticipated discharge in approximately 3-4 day(s).  ? ?Signed: ?Lacinda Axon, MD ?10/01/2021, 8:12 AM  ?Pager: [phone] ?Internal Medicine Teaching Service ?After 5pm on weekdays and 1pm on weekends: On Call pager: [phone] ? ?

## 2021-10-01 NOTE — Progress Notes (Signed)
There was IV team consult for inserting PIV access due to one medication of IV Protonix. Informed patient's RN and Dr. Coy Saunas regarding this matter and possible change to po Protonix. Dr. Coy Saunas will change order from IV to PO Protonix. No PIV access at this time. HS Kevin Hayward RN ?

## 2021-10-01 NOTE — Progress Notes (Signed)
[facility] ?Progress Note ? ?Name: Kevin Soto ?MRN: [id number] ?DOB: Nov 24, 1960 ? ?Chief Complaint:  ?Here for abnormal labs ? ?Subjective:  ?He had 1.5 L UOP over 5/13.  First HD on 5/13 and HD RN did not chart the amount of fluid removed.  Didn't have any issues with treatment and at first he didn't realize that he had actually had dialysis.  ? ?Review of systems:      ?Denies shortness of breath or chest pain  ?Denies n/v  ?States he is urinating without difficulty ?--------------- ?Background on consult:  ?Kevin Soto is a 61 y.o. male with a history of HTN, CAD, DM, prior CVA, ischemic cardiomyopathy, and CKD stage III who presented to the hospital at the urging of his PCP, where he had gone for a routine labs.  He follows at [facility].  He does not have a nephrologist.  He states that he has been eating okay until recently.  He did have some nausea and has been throwing up in the morning the last couple of days.  He denies any shortness of breath or any abdominal pain.  He denies any diarrhea.  He states that food tastes normal.  He has not had as much of an appetite for about the past week.  He states that he has been urinating without difficulty and is making approximately normal amount of urine.  Note that the patient's home medications include hydrochlorothiazide, lisinopril, jardiance, and metformin.  He denies any new medications.  He has been on each of those particularly for a while.  He denies any Advil, Motrin, BC powders, Goody powders, alcohol also or other nonsteroidals.  He does take a baby aspirin daily for his heart.  He denies any family history of end-stage renal disease or chronic kidney disease.  His mother does have history of kidney stones.  ?  ?The patient understands that we are aggressively trying medical measures to improve his kidney function including hydration and holding medications.  If his renal function does not improve he would want dialysis  if it were needed to save his life.  He and I have discussed the risks and benefits and indications for dialysis as well as risks of foregoing dialysis if it were to become indicated.  His parents were both on dialysis and he's not sure what caused their ESRD ? ?His scanned labs from 09/25/21 have BUN 78 and Cr 9.32.   ? ? ?Intake/Output Summary (Last 24 hours) at 10/01/2021 0947 ?Last data filed at 10/01/2021 0919 ?Gross per 24 hour  ?Intake 240 ml  ?Output 1770 ml  ?Net -1530 ml  ? ? ?Vitals:  ?Vitals:  ? 09/30/21 1835 09/30/21 2054 10/01/21 0520 10/01/21 0811  ?BP: 130/66 132/82 123/75 140/76  ?Pulse: 80 89 84 80  ?Resp: '18 19 17 18  '$ ?Temp: 98.1 ?F (36.7 ?C) 97.8 ?F (36.6 ?C) 98.5 ?F (36.9 ?C) 97.8 ?F (36.6 ?C)  ?TempSrc: Oral Oral Oral Axillary  ?SpO2: 100% 98% 100% 100%  ?Weight:      ?Height:      ?  ? ?Physical Exam:     ?General: Adult male in stretcher in no acute distress at rest ?HEENT: Normocephalic atraumatic ?Eyes: Extraocular movements intact sclera anicteric ?Neck: Supple trachea midline ?Heart: S1-S2 no rub ?Lungs: Clear to auscultation bilaterally normal work of breathing at rest on room air ?Abdomen: Soft nontender nondistended ?Extremities: No edema appreciated no cyanosis or clubbing ?Neuro: Alert and oriented x3 provides a  history and follows commands ?Psych normal mood and affect ?Genitourinary no foley urinal at bedside partially full  ?Access RIJ tunneled catheter in place ? ?Medications reviewed  ? ?Labs:  ? ?  Latest Ref Rng & Units 10/01/2021  ?  2:16 AM 09/30/2021  ? 10:27 AM 09/30/2021  ?  1:26 AM  ?BMP  ?Glucose 70 - 99 mg/dL 120   129   137    ?BUN 8 - 23 mg/dL 64   100   103    ?Creatinine 0.61 - 1.24 mg/dL 10.81   14.37   14.28    ?Sodium 135 - 145 mmol/L 138   139   135    ?Potassium 3.5 - 5.1 mmol/L 2.8   3.4   3.3    ?Chloride 98 - 111 mmol/L 103   106   103    ?CO2 22 - 32 mmol/L '24   19   16    '$ ?Calcium 8.9 - 10.3 mg/dL 7.0   6.9   6.8    ? ? ? ?Assessment/Plan:  ? ?# AKI  ?- Concern  for multiple pre-renal insults in the setting of diuretic, ACE inhibitor use and nausea with vomiting.  Unfortanately no improvement with foley and fluids.  Intake UA with 100 mg/dL protein and 0-5 RBC.  CT a/p without hydro.  up/cr ratio 1380 mg/g. Complement normal and ANA neeg.  He had RIJ tunneled catheter placed for HD on 5/12 with IR and first HD on 5/13 ?- consulted IR for renal biopsy - this is planned for 5/17 after plavix washout and to optimize with HD (I requested that they move the biopsy from 5/16 to 5/17) ?- Hold his plavix please ?- HD on 5/15 and 5/16 to optimize prior to biopsy ?- He should remain off of his home home medications hydrochlorothiazide, lisinopril, jardiance, and metformin  ?- work-up for proteinuria as below. ANCA pending ?  ?# Proteinuria  ?- May be 2/2 DM  ?- SPEP pending.  Free light chain ratio mildly elevated. random UPEP is pending  ? ?# CKD stage 3 ?- likely 2/2 DM and HTN ?- Baseline near 1.8 and per the [facility] system he was last at baseline in February per labs available to me  ?- Note mild proteinuria for several years per lab history imported ? ?# Hypokalemia  ?- replete with 40 meq PO once ?- repeat K at 3pm ?  ?# HTN  ?- hold hydrochlorothiazide and lisinopril. Unsure if he had hypotension at home when on these agents  ?- Controlled; avoid hypotension ? ?# Metabolic acidosis  ?- setting of AKI ?- manage with HD for now ?  ?# DM type 2 with CKD  ?- hold home jardiance and metformin.   ?  ?# normocytic anemia  ?- has worsened a bit with hydration  ?- iron is replete; anticipate need for ESA ?  ?Disposition - please continue inpatient monitoring.  Starting HD and planning for renal biopsy ? ?Claudia Desanctis, MD ?10/01/2021 10:16 AM ? ? ? ? ? ? ? ? ?

## 2021-10-02 ENCOUNTER — Inpatient Hospital Stay (HOSPITAL_COMMUNITY): Payer: Medicare Other

## 2021-10-02 DIAGNOSIS — N19 Unspecified kidney failure: Secondary | ICD-10-CM | POA: Diagnosis not present

## 2021-10-02 LAB — GLUCOSE, CAPILLARY
Glucose-Capillary: 128 mg/dL — ABNORMAL HIGH (ref 70–99)
Glucose-Capillary: 170 mg/dL — ABNORMAL HIGH (ref 70–99)
Glucose-Capillary: 233 mg/dL — ABNORMAL HIGH (ref 70–99)

## 2021-10-02 LAB — PROTEIN ELECTROPHORESIS, SERUM
A/G Ratio: 1 (ref 0.7–1.7)
Albumin ELP: 3.1 g/dL (ref 2.9–4.4)
Alpha-1-Globulin: 0.2 g/dL (ref 0.0–0.4)
Alpha-2-Globulin: 1.1 g/dL — ABNORMAL HIGH (ref 0.4–1.0)
Beta Globulin: 0.8 g/dL (ref 0.7–1.3)
Gamma Globulin: 1.2 g/dL (ref 0.4–1.8)
Globulin, Total: 3.2 g/dL (ref 2.2–3.9)
Total Protein ELP: 6.3 g/dL (ref 6.0–8.5)

## 2021-10-02 LAB — BASIC METABOLIC PANEL WITH GFR
Anion gap: 11 (ref 5–15)
BUN: 66 mg/dL — ABNORMAL HIGH (ref 8–23)
CO2: 24 mmol/L (ref 22–32)
Calcium: 7.4 mg/dL — ABNORMAL LOW (ref 8.9–10.3)
Chloride: 105 mmol/L (ref 98–111)
Creatinine, Ser: 10.79 mg/dL — ABNORMAL HIGH (ref 0.61–1.24)
GFR, Estimated: 5 mL/min — ABNORMAL LOW
Glucose, Bld: 129 mg/dL — ABNORMAL HIGH (ref 70–99)
Potassium: 3 mmol/L — ABNORMAL LOW (ref 3.5–5.1)
Sodium: 140 mmol/L (ref 135–145)

## 2021-10-02 LAB — CBC
HCT: 26.8 % — ABNORMAL LOW (ref 39.0–52.0)
Hemoglobin: 9.1 g/dL — ABNORMAL LOW (ref 13.0–17.0)
MCH: 28 pg (ref 26.0–34.0)
MCHC: 34 g/dL (ref 30.0–36.0)
MCV: 82.5 fL (ref 80.0–100.0)
Platelets: 172 K/uL (ref 150–400)
RBC: 3.25 MIL/uL — ABNORMAL LOW (ref 4.22–5.81)
RDW: 14.6 % (ref 11.5–15.5)
WBC: 10.5 K/uL (ref 4.0–10.5)
nRBC: 0 % (ref 0.0–0.2)

## 2021-10-02 MED ORDER — DARBEPOETIN ALFA 60 MCG/0.3ML IJ SOSY
60.0000 ug | PREFILLED_SYRINGE | INTRAMUSCULAR | Status: DC
  Administered 2021-10-02: 60 ug via INTRAVENOUS

## 2021-10-02 MED ORDER — POTASSIUM CHLORIDE CRYS ER 20 MEQ PO TBCR
40.0000 meq | EXTENDED_RELEASE_TABLET | Freq: Once | ORAL | Status: AC
  Administered 2021-10-02: 40 meq via ORAL
  Filled 2021-10-02: qty 2

## 2021-10-02 MED ORDER — DARBEPOETIN ALFA 60 MCG/0.3ML IJ SOSY
PREFILLED_SYRINGE | INTRAMUSCULAR | Status: AC
  Filled 2021-10-02: qty 0.3

## 2021-10-02 NOTE — Progress Notes (Signed)
[facility] ?NEPHROLOGY PROGRESS NOTE ? ?Assessment/ Plan: ?Pt is a 61 y.o. yo male with history of HTN, CAD, DM, stroke, ischemic cardiomyopathy, CKD 3b admitted with worsening renal failure. ? ?#Acute kidney injury on CKD stage IIIb presumably ischemic ATN in the setting of ACE inhibitor, diuretics.  It seems like his baseline creatinine level is around 1.6-2s, admitted with a creatinine level of 13 on admission.  UA with minimal protein, no RBCs, UPC 1.38 g.  Serologies unremarkable including ANA, C3, C4, kappa lambda ratio.  Pending ANCA and protein electrophoresis.  Hepatitis B, C and HIV negative. ?CT abdomen pelvis without hydro. ?Status post right IJ TDC placed on 5/12 and first HD on 5/13. ?IR was consulted for the kidney biopsy.  Recommend to do biopsy on 5/17 after Plavix washout and optimizing with HD. ?Plan for dialysis today. ?Continue to hold HCTZ, lisinopril, Jardiance, metformin etc. ?Strict ins and outs and daily lab. ?Given longstanding CKD, he may have a lot of chronic finding on biopsy.  I think he may need dialysis therefore I will consult renal navigator to arrange outpatient HD for AKI. ? ?#Proteinuria probably because of diabetes.  Serologies unremarkable. ? ?#Hypokalemia: Replete potassium chloride.  Monitor lab. ? ?#Hypertension: Holding diuretics and lisinopril. ? ?#Metabolic acidosis now managing with dialysis. ? ?#Anemia, due to chronic disease/CKD: Iron repleted.  Will start ESA. ? ?Subjective: Seen and examined at bedside.  Urine output around 750 cc in 24 hours.  He reported feeling much better after first HD.  No nausea, vomiting, chest pain, shortness of breath.  He is aunt and niece were present at the bedside.  Also discussed with the  nurse ?Objective ?Vital signs in last 24 hours: ?Vitals:  ? 10/01/21 1620 10/01/21 2113 10/02/21 0435 10/02/21 0800  ?BP: (!) 150/68 126/72 125/77 133/70  ?Pulse: 82 73 77 67  ?Resp: '19 16 16 18  '$ ?Temp: 97.9 ?F (36.6 ?C) 98.5 ?F  (36.9 ?C) 98.4 ?F (36.9 ?C) 98.4 ?F (36.9 ?C)  ?TempSrc: Oral Oral Oral Oral  ?SpO2: 100% 100% 100% 100%  ?Weight:      ?Height:      ? ?Weight change:  ? ?Intake/Output Summary (Last 24 hours) at 10/02/2021 1414 ?Last data filed at 10/02/2021 1239 ?Gross per 24 hour  ?Intake 200 ml  ?Output 1050 ml  ?Net -850 ml  ? ? ? ? ? ?Labs: ?Basic Metabolic Panel: ?Recent Labs  ?Lab 09/28/21 ?0236 09/29/21 ?0236 09/30/21 ?0126 09/30/21 ?1027 10/01/21 ?0216 10/01/21 ?1543 10/02/21 ?0744  ?NA 141 137   < > 139 138  --  140  ?K 3.4* 3.5   < > 3.4* 2.8* 3.3* 3.0*  ?CL 109 101   < > 106 103  --  105  ?CO2 11* 17*   < > 19* 24  --  24  ?GLUCOSE 67* 128*   < > 129* 120*  --  129*  ?BUN 81* 103*   < > 100* 64*  --  66*  ?CREATININE 11.77* 13.99*   < > 14.37* 10.81*  --  10.79*  ?CALCIUM 7.3* 7.4*   < > 6.9* 7.0*  --  7.4*  ?PHOS 5.0* 6.5*  --  5.2*  --   --   --   ? < > = values in this interval not displayed.  ? ?Liver Function Tests: ?Recent Labs  ?Lab 09/28/21 ?0236 09/30/21 ?1027  ?ALBUMIN 2.5* 2.5*  ? ?No results for input(s): LIPASE, AMYLASE in the last 168 hours. ?No  results for input(s): AMMONIA in the last 168 hours. ?CBC: ?Recent Labs  ?Lab 09/27/21 ?1321 09/28/21 ?0236 09/28/21 ?1918 09/29/21 ?0236 09/30/21 ?0126 10/01/21 ?0216 10/02/21 ?2952  ?WBC 13.0*   < > 16.3* 13.8* 10.7* 8.7 10.5  ?NEUTROABS 10.2*  --  13.2*  --   --   --   --   ?HGB 11.4*   < > 10.4* 9.7* 9.2* 9.1* 9.1*  ?HCT 34.2*   < > 30.9* 28.2* 26.9* 25.9* 26.8*  ?MCV 84.4   < > 85.1 82.7 83.0 79.7* 82.5  ?PLT 275   < > 269 252 221 177 172  ? < > = values in this interval not displayed.  ? ?Cardiac Enzymes: ?Recent Labs  ?Lab 09/28/21 ?1520  ?CKTOTAL 155  ? ?CBG: ?Recent Labs  ?Lab 10/01/21 ?1231 10/01/21 ?1622 10/01/21 ?2158 10/02/21 ?0800 10/02/21 ?1238  ?GLUCAP 187* 166* 157* 128* 170*  ? ? ?Iron Studies: No results for input(s): IRON, TIBC, TRANSFERRIN, FERRITIN in the last 72 hours. ?Studies/Results: ?No results found. ? ?Medications: ?Infusions: ? sodium  chloride    ? sodium chloride    ? ? ?Scheduled Medications: ? atorvastatin  80 mg Oral Daily  ? carvedilol  25 mg Oral BID WC  ? Chlorhexidine Gluconate Cloth  6 each Topical Q0600  ? dorzolamide-timolol  1 drop Both Eyes Daily  ? fluticasone  2 spray Each Nare Daily  ? insulin aspart  0-6 Units Subcutaneous TID WC  ? pantoprazole  40 mg Oral BID  ? tamsulosin  0.4 mg Oral Daily  ? ? have reviewed scheduled and prn medications. ? ?Physical Exam: ?General:NAD, comfortable ?Heart:RRR, s1s2 nl ?Lungs:clear b/l, no crackle ?Abdomen:soft, Non-tender, non-distended ?Extremities:No edema ?Dialysis Access: Right IJ TDC in place. ? ?[place] ?10/02/2021,2:14 PM ? LOS: 4 days  ? ?

## 2021-10-02 NOTE — Progress Notes (Addendum)
? ?  Subjective: ?No acute overnight events.  ? ?Patient was seen at bedside during rounds today. Resting comfortably in bed, feels well, and has no complaints. Denies confusion, SHOB, chest pain, N/V, and abdominal pain. He is A&Ox3. He is engaged in conversation today and does not appear lethargic as previous days.  ? ?Pt is updated on the plan for today, and all questions and concerns are addressed.  ? ?Objective: ? ?Vital signs in last 24 hours: ?Vitals:  ? 10/01/21 1620 10/01/21 2113 10/02/21 0435 10/02/21 0800  ?BP: (!) 150/68 126/72 125/77 133/70  ?Pulse: 82 73 77 67  ?Resp: '19 16 16 18  '$ ?Temp: 97.9 ?F (36.6 ?C) 98.5 ?F (36.9 ?C) 98.4 ?F (36.9 ?C) 98.4 ?F (36.9 ?C)  ?TempSrc: Oral Oral Oral Oral  ?SpO2: 100% 100% 100% 100%  ?Weight:      ?Height:      ? ?General: Pleasant elderly gentleman laying in bed. No acute distress. ?CV: RRR. No murmurs, rubs, or gallops. No LE edema ?Pulmonary: Lungs CTAB. Normal effort. No wheezing or rales. ?Abdominal: Soft, nontender, nondistended. Normal bowel sounds. ?Skin: Warm and dry  ?Neuro: A&Ox3. Appropriate responses.   ? ?Assessment/Plan: ? ?Principal Problem: ?  Uremia ? ?Kevin Soto is a 61 y.o. male with a history of HTN, CAD, DM, prior CVA, ischemic cardiomyopathy, and CKD stage IIIB admitted for AKI on CKD3b, now ESRD on HD.  ? ?AKI on CKD3b, now ESRD on HD  ?Proteinuria  ?Uremia, improved ?AGMA, resolved  ?Hypokalemia ?Improvement in mental status with ongoing HD sessions. Renal function stable from yesterday, but significantly improved since admission; BUN 64 -> 66 and sCr stable at 10.8. Much clinical improvement, appreciate nephrology colleagues. Nephrology believes this may be ATN from ACEi and diuretics. Workup for rapidly progressive kidney disease pending. Current workup showing ANA negative, C3 and C4 wnl. Will require hepatitis B immunization prior to discharge. ?--Nephrology following, appreciate recs ?--Pending ANCA studies, SPEP and UPEP ?--Kidney  biopsy planned for 5/17, pending Plavix washout and patient optimization. ?--Renal navigator has been made aware, as he may require HD long term ?--Avoid nephrotoxins  ?--Continue holding HCTZ, lisinopril, Jardiance and metformin ? ?Coffee ground emesis  ?Hx of normocytic anemia ?No emesis since admission. No evidence of active GI bleed. HDS with stable Hgb.  Per GI, coffee-ground emesis likely from uremia. Will likely need an EGD during this hospitalization but currently not urgent. ?--GI signed off for now but would like to reengage once patient's renal function improves for EGD, prior to D/C ?--Continue Protonix 40 mg BID ?--Continue to hold ASA and Plavix ?--Trend CBC and transfuse for Hgb <7 or hemodynamic instability ?  ?HTN ?HLD ?CAD ?Ischemic cardiomyopathy ?Hx of uncontrolled BP on multiple antihypertensive regimen. BP stable. Patient euvolemic on exam.  ?--Continue Coreg 25 mg BID ?--Holding lisinopril and HCTZ in setting of AKI  ?--Holding home ASA 81 mg and Plavix 75 mg daily  ?--Continue home Lipitor 80 mg daily ?  ?T2DM ?Relatively well controlled with 5/10 A1c 7.3%.   ?--SSI with meal, with CBG monitoring  ?--Hold metformin and Jardiance in setting of AKI  ?  ? ?Best Practice: ?Diet: Renal ?IVF: NS,100cc/hr ?VTE: SCDs ?Code: Full ? ? ?Lajean Manes, MD  ?Internal Medicine Resident, PGY-1 ?Pager: [phone] ?After 5pm on weekdays and 1pm on weekends: On Call pager [phone] ?

## 2021-10-02 NOTE — Care Management Important Message (Signed)
Important Message ? ?Patient Details  ?Name: Kevin Soto ?MRN: [id number] ?Date of Birth: 08-01-1960 ? ? ?Medicare Important Message Given:  Yes ? ? ? ? ?Memory Argue ?10/02/2021, 2:38 PM ?

## 2021-10-02 NOTE — Progress Notes (Signed)
? ?  Subjective: ?No acute overnight events.  ? ?Patient was seen at bedside in PACU during rounds today. Resting comfortably in bed, feels well, and has no complaints. Denies confusion, SHOB, chest pain, N/V, and abdominal pain. He appears much improved compared to admission.  ? ?Pt is updated on the plan for today, and all questions and concerns are addressed.  ? ?Objective: ? ?Vital signs in last 24 hours: ?Vitals:  ? 10/02/21 1451 10/02/21 1506 10/02/21 1520 10/02/21 1530  ?BP: 120/72 113/74 125/79 108/66  ?Pulse: 79 76 74 75  ?Resp: '14 14 18 16  '$ ?Temp:      ?TempSrc: Oral     ?SpO2: 100% 100% 98% 98%  ?Weight: 92.3 kg     ?Height:      ? ?General: Pleasant elderly gentleman laying in bed. No acute distress. ?CV: RRR. No murmurs, rubs, or gallops. No LE edema ?Pulmonary: Lungs CTAB. Normal effort. No wheezing or rales. ?Abdominal: Soft, nontender, nondistended. Normal bowel sounds. ?Skin: Warm and dry  ?Neuro: A&Ox3. Appropriate responses.   ? ?Assessment/Plan: ? ?Principal Problem: ?  Uremia ? ?Kevin Soto is a 61 y.o. male with a history of HTN, CAD, DM, prior CVA, ischemic cardiomyopathy, and CKD stage IIIB admitted for AKI on CKD3b, now ESRD on HD, s/p renal biopsy 5/16.  ? ?AKI on CKD3b, now ESRD on HD ?Proteinuria  ?Uremia, improved ?AGMA, resolved  ?Improvement in mental status with ongoing HD sessions. Renal function improved since admission. He had a bump in Cr 6.6 -> 6.9, BUN is stable 36 ->37. Plan is HD today for further improvement in numbers. Workup for rapidly progressive kidney disease reassuring, pending renal biopsy results. Nephrology aware that he continues to make urine-- recorded output 300cc yesterday, however bed was wet with urine per RN; may require diuretic at time of discharge. Renal navigator currently assisting with OP HD facility placement, and anticipate d/c within the next 1-2 days. ?--Nephrology following, appreciate recs ?--HD today  ?--Follow up kidney biopsy  ?--Avoid  nephrotoxins  ?--Continue holding HCTZ, lisinopril, Jardiance and metformin  ? ?Hypokalemia  ?K 3.3 -- repleted with Kdur 40 mEq BID  ? ?Coffee ground emesis  ?Hx of normocytic anemia ?No emesis since admission. No evidence of active GI bleed. HDS with stable Hgb. EGD performed today showing very mild reflux esophagitis and mild erythema in duodenal bulb; no ulcers or high grade stigmata of bleeding to warrant endoscopic intervention. Coffee-ground emesis was likely from uremia.  ?--Resumed home Plavix and ASA  ?--Continue Protonix 40 mg PO BID x6 weeks, then reduce to 40 mg daily ?lowest effective dose to control reflux symptoms. ?--GI signed off  ?--ADAT ?--Trend CBC and transfuse for Hgb <7 or hemodynamic instability ?  ?HTN ?HLD ?CAD ?Ischemic cardiomyopathy ?Hx of uncontrolled BP on multiple antihypertensive regimen. BP stable. Patient euvolemic on exam.  ?--Continue Coreg 25 mg BID ?--Holding lisinopril and HCTZ in setting of AKI  ?--Resumed home ASA 81 mg and Plavix 75 mg daily  ?--Continue home Lipitor 80 mg daily ?  ?T2DM ?Relatively well controlled with 5/10 A1c 7.3%.   ?--SSI with meal, with CBG monitoring  ?--Hold metformin and Jardiance in setting of AKI  ?  ? ?Best Practice: ?Diet: Renal ?IVF: NS,100cc/hr ?VTE: SCDs ?Code: Full ? ? ?Lajean Manes, MD  ?Internal Medicine Resident, PGY-1 ?Pager: [phone] ?After 5pm on weekdays and 1pm on weekends: On Call pager [phone] ?

## 2021-10-03 ENCOUNTER — Inpatient Hospital Stay (HOSPITAL_COMMUNITY): Payer: Medicare Other

## 2021-10-03 DIAGNOSIS — N179 Acute kidney failure, unspecified: Secondary | ICD-10-CM

## 2021-10-03 DIAGNOSIS — N19 Unspecified kidney failure: Secondary | ICD-10-CM | POA: Diagnosis not present

## 2021-10-03 LAB — CBC
HCT: 27.5 % — ABNORMAL LOW (ref 39.0–52.0)
Hemoglobin: 9.4 g/dL — ABNORMAL LOW (ref 13.0–17.0)
MCH: 28.1 pg (ref 26.0–34.0)
MCHC: 34.2 g/dL (ref 30.0–36.0)
MCV: 82.1 fL (ref 80.0–100.0)
Platelets: 148 10*3/uL — ABNORMAL LOW (ref 150–400)
RBC: 3.35 MIL/uL — ABNORMAL LOW (ref 4.22–5.81)
RDW: 14.5 % (ref 11.5–15.5)
WBC: 9.3 10*3/uL (ref 4.0–10.5)
nRBC: 0 % (ref 0.0–0.2)

## 2021-10-03 LAB — HCV AB W REFLEX TO QUANT PCR: HCV Ab: NONREACTIVE

## 2021-10-03 LAB — BASIC METABOLIC PANEL
Anion gap: 9 (ref 5–15)
BUN: 36 mg/dL — ABNORMAL HIGH (ref 8–23)
CO2: 25 mmol/L (ref 22–32)
Calcium: 7.7 mg/dL — ABNORMAL LOW (ref 8.9–10.3)
Chloride: 105 mmol/L (ref 98–111)
Creatinine, Ser: 6.63 mg/dL — ABNORMAL HIGH (ref 0.61–1.24)
GFR, Estimated: 9 mL/min — ABNORMAL LOW (ref >60)
Glucose, Bld: 165 mg/dL — ABNORMAL HIGH (ref 70–99)
Potassium: 3 mmol/L — ABNORMAL LOW (ref 3.5–5.1)
Sodium: 139 mmol/L (ref 135–145)

## 2021-10-03 LAB — GLUCOSE, CAPILLARY
Glucose-Capillary: 148 mg/dL — ABNORMAL HIGH (ref 70–99)
Glucose-Capillary: 140 mg/dL — ABNORMAL HIGH (ref 70–99)
Glucose-Capillary: 148 mg/dL — ABNORMAL HIGH (ref 70–99)
Glucose-Capillary: 231 mg/dL — ABNORMAL HIGH (ref 70–99)

## 2021-10-03 LAB — ANCA PROFILE
Anti-MPO Antibodies: <0.2 units (ref 0.0–0.9)
Anti-PR3 Antibodies: <0.2 units (ref 0.0–0.9)
Atypical P-ANCA titer: <1:20 {titer}
C-ANCA: <1:20 {titer}
P-ANCA: <1:20 {titer}

## 2021-10-03 LAB — PROTIME-INR
INR: 1.1 (ref 0.8–1.2)
Prothrombin Time: 14.5 seconds (ref 11.4–15.2)

## 2021-10-03 LAB — HCV INTERPRETATION

## 2021-10-03 MED ORDER — MIDAZOLAM HCL 2 MG/2ML IJ SOLN
INTRAMUSCULAR | Status: AC | PRN
Start: 2021-10-03 — End: 2021-10-03
  Administered 2021-10-03: 1 mg via INTRAVENOUS

## 2021-10-03 MED ORDER — FENTANYL CITRATE (PF) 100 MCG/2ML IJ SOLN
INTRAMUSCULAR | Status: AC | PRN
  Administered 2021-10-03: 25 ug via INTRAVENOUS

## 2021-10-03 MED ORDER — FENTANYL CITRATE (PF) 100 MCG/2ML IJ SOLN
INTRAMUSCULAR | Status: AC
  Filled 2021-10-03: qty 2

## 2021-10-03 MED ORDER — MIDAZOLAM HCL 2 MG/2ML IJ SOLN
INTRAMUSCULAR | Status: AC
  Filled 2021-10-03: qty 2

## 2021-10-03 MED ORDER — LIDOCAINE HCL (PF) 1 % IJ SOLN
INTRAMUSCULAR | Status: AC
  Filled 2021-10-03: qty 30

## 2021-10-03 MED ORDER — POTASSIUM CHLORIDE CRYS ER 20 MEQ PO TBCR
40.0000 meq | EXTENDED_RELEASE_TABLET | Freq: Once | ORAL | Status: AC
  Administered 2021-10-03: 40 meq via ORAL
  Filled 2021-10-03: qty 2

## 2021-10-03 MED ORDER — GELATIN ABSORBABLE 12-7 MM EX MISC
CUTANEOUS | Status: AC
  Filled 2021-10-03: qty 1

## 2021-10-03 NOTE — Plan of Care (Signed)
?  Problem: Education: ?Goal: Knowledge of General Education information will improve ?Description: Including pain rating scale, medication(s)/side effects and non-pharmacologic comfort measures ?Outcome: Completed/Met ?  ?Problem: Health Behavior/Discharge Planning: ?Goal: Ability to manage health-related needs will improve ?Outcome: Completed/Met ?  ?Problem: Clinical Measurements: ?Goal: Ability to maintain clinical measurements within normal limits will improve ?Outcome: Progressing ?Goal: Diagnostic test results will improve ?Outcome: Progressing ?Goal: Respiratory complications will improve ?Outcome: Completed/Met ?Goal: Cardiovascular complication will be avoided ?Outcome: Completed/Met ?  ?Problem: Activity: ?Goal: Risk for activity intolerance will decrease ?Outcome: Progressing ?  ?Problem: Nutrition: ?Goal: Adequate nutrition will be maintained ?Outcome: Progressing ?  ?Problem: Coping: ?Goal: Level of anxiety will decrease ?Outcome: Completed/Met ?  ?Problem: Elimination: ?Goal: Will not experience complications related to bowel motility ?Outcome: Completed/Met ?Goal: Will not experience complications related to urinary retention ?Outcome: Progressing ?  ?Problem: Pain Managment: ?Goal: General experience of comfort will improve ?Outcome: Completed/Met ?  ?Problem: Safety: ?Goal: Ability to remain free from injury will improve ?Outcome: Completed/Met ?  ?

## 2021-10-03 NOTE — Progress Notes (Signed)
[facility] ?NEPHROLOGY PROGRESS NOTE ? ?Assessment/ Plan: ?Pt is a 61 y.o. yo male with history of HTN, CAD, DM, stroke, ischemic cardiomyopathy, CKD 3b admitted with worsening renal failure. ? ?#Acute kidney injury on CKD stage IIIb presumably ischemic ATN in the setting of ACE inhibitor, diuretics.  It seems like his baseline creatinine level is around 1.6-2s, admitted with a creatinine level of 13 on admission.  UA with minimal protein, no RBCs, UPC 1.38 g.  Serologies unremarkable including ANA, C3, C4, kappa lambda ratio.  Pending ANCA and protein electrophoresis.  Hepatitis B, C and HIV negative. ?CT abdomen pelvis without hydro. ?Status post right IJ TDC placed on 5/12 and first HD on 5/13 and second HD on 5/15. ?He is just back from IR after kidney biopsy.  He denies any complaint.  No pain.  I discussed with the patient and nurse about bedrest, no lifting, no use of anticoagulation.  We will repeat lab in the morning. ?Continue to hold HCTZ, lisinopril, Jardiance, metformin etc. ?Strict ins and outs and daily lab. ?Given longstanding CKD, he may have a lot of chronic finding on biopsy.  Renal navigator is already aware of possible need of outpatient dialysis for AKI.  No need for HD today, will assess in the morning. ? ?#Proteinuria probably because of diabetes.  Serologies unremarkable. ? ?#Hypokalemia: Replete potassium chloride.  Monitor labs. ? ?#Hypertension: Holding diuretics and lisinopril. ? ?#Metabolic acidosis now managing with dialysis. ? ?#Anemia, due to chronic disease/CKD: Iron repleted.  Started ESA. ? ?Subjective: Seen and examined at bedside.  He just came back from IR after kidney biopsy. The procedure went well.  The pathology form was filled and was sent to IR.  Patient denies back pain, nausea, vomiting, chest pain, shortness of breath. ?Objective ?Vital signs in last 24 hours: ?Vitals:  ? 10/03/21 1015 10/03/21 1020 10/03/21 1025 10/03/21 1035  ?BP: 129/66 117/76 118/72  130/72  ?Pulse: 83 85 80 86  ?Resp: '13 12 11 14  '$ ?Temp:      ?TempSrc:      ?SpO2: 100% 100% 100% 96%  ?Weight:      ?Height:      ? ?Weight change:  ? ?Intake/Output Summary (Last 24 hours) at 10/03/2021 1058 ?Last data filed at 10/02/2021 1800 ?Gross per 24 hour  ?Intake 200 ml  ?Output 500 ml  ?Net -300 ml  ? ? ? ? ? ? ?Labs: ?Basic Metabolic Panel: ?Recent Labs  ?Lab 09/28/21 ?0236 09/29/21 ?0236 09/30/21 ?0126 09/30/21 ?1027 10/01/21 ?0216 10/01/21 ?1543 10/02/21 ?4098 10/03/21 ?0118  ?NA 141 137   < > 139 138  --  140 139  ?K 3.4* 3.5   < > 3.4* 2.8* 3.3* 3.0* 3.0*  ?CL 109 101   < > 106 103  --  105 105  ?CO2 11* 17*   < > 19* 24  --  24 25  ?GLUCOSE 67* 128*   < > 129* 120*  --  129* 165*  ?BUN 81* 103*   < > 100* 64*  --  66* 36*  ?CREATININE 11.77* 13.99*   < > 14.37* 10.81*  --  10.79* 6.63*  ?CALCIUM 7.3* 7.4*   < > 6.9* 7.0*  --  7.4* 7.7*  ?PHOS 5.0* 6.5*  --  5.2*  --   --   --   --   ? < > = values in this interval not displayed.  ? ? ?Liver Function Tests: ?Recent Labs  ?Lab 09/28/21 ?0236  09/30/21 ?1027  ?ALBUMIN 2.5* 2.5*  ? ? ?No results for input(s): LIPASE, AMYLASE in the last 168 hours. ?No results for input(s): AMMONIA in the last 168 hours. ?CBC: ?Recent Labs  ?Lab 09/27/21 ?1321 09/28/21 ?0236 09/28/21 ?1918 09/29/21 ?0236 09/30/21 ?0126 10/01/21 ?0216 10/02/21 ?2952 10/03/21 ?0118  ?WBC 13.0*   < > 16.3* 13.8* 10.7* 8.7 10.5 9.3  ?NEUTROABS 10.2*  --  13.2*  --   --   --   --   --   ?HGB 11.4*   < > 10.4* 9.7* 9.2* 9.1* 9.1* 9.4*  ?HCT 34.2*   < > 30.9* 28.2* 26.9* 25.9* 26.8* 27.5*  ?MCV 84.4   < > 85.1 82.7 83.0 79.7* 82.5 82.1  ?PLT 275   < > 269 252 221 177 172 148*  ? < > = values in this interval not displayed.  ? ? ?Cardiac Enzymes: ?Recent Labs  ?Lab 09/28/21 ?1520  ?CKTOTAL 155  ? ? ?CBG: ?Recent Labs  ?Lab 10/01/21 ?2158 10/02/21 ?0800 10/02/21 ?1238 10/02/21 ?2201 10/03/21 ?8413  ?GLUCAP 157* 128* 170* 233* 148*  ? ? ? ?Iron Studies: No results for input(s): IRON, TIBC,  TRANSFERRIN, FERRITIN in the last 72 hours. ?Studies/Results: ?No results found. ? ?Medications: ?Infusions: ? sodium chloride    ? sodium chloride    ? ? ?Scheduled Medications: ? atorvastatin  80 mg Oral Daily  ? carvedilol  25 mg Oral BID WC  ? Chlorhexidine Gluconate Cloth  6 each Topical Q0600  ? darbepoetin (ARANESP) injection - DIALYSIS  60 mcg Intravenous Q Mon-HD  ? dorzolamide-timolol  1 drop Both Eyes Daily  ? fluticasone  2 spray Each Nare Daily  ? gelatin adsorbable      ? insulin aspart  0-6 Units Subcutaneous TID WC  ? lidocaine (PF)      ? pantoprazole  40 mg Oral BID  ? tamsulosin  0.4 mg Oral Daily  ? ? have reviewed scheduled and prn medications. ? ?Physical Exam: ?General: Able to lie comfortable, not in distress ?Heart:RRR, s1s2 nl ?Lungs: Clear b/l, no crackle ?Abdomen:soft, Non-tender, non-distended ?Extremities: No peripheral edema. ?Dialysis Access: Right IJ TDC in place. ? ?[place] ?10/03/2021,10:58 AM ? LOS: 5 days  ? ?

## 2021-10-03 NOTE — Procedures (Signed)
Interventional Radiology Procedure Note ? ?Date of Procedure: 10/03/2021  ?Procedure: [place] random renal biopsy  ? ?Findings:  ?1. [place] random renal biopsy left renal lower pole 16ga x2 passes  ?2. Gelfoam slurry   ? ?Complications: No immediate complications noted.  ? ?Estimated Blood Loss: minimal ? ?Follow-up and Recommendations: ?1. Bedrest 4 hours  ? ? ?Albin Felling, MD  ?Vascular & Interventional Radiology  ?10/03/2021 10:45 AM ? ? ? ?

## 2021-10-03 NOTE — Progress Notes (Signed)
? ?HD#5 ?SUBJECTIVE:  ?Patient Summary: Kevin Soto is a 61 y.o. with a pertinent PMH of type 2 diabetes, prior CVA, ischemic cardiomyopathy, and CKD stage IIIa, who presented with worsening renal function and admitted for AKI on CKD.  ? ?Overnight Events: None ? ?Interim History: Patient was assessed at bedside following returning from IR suite for kidney biopsy.  He does not have any complaints currently.  He states that he was able to eat lunch after procedure.  He has had urine output today.  Denies shortness of breath, chest pain, and pain at biopsy site. ? ?OBJECTIVE:  ?Vital Signs: ?Vitals:  ? 10/02/21 1800 10/02/21 2101 10/03/21 0448 10/03/21 0630  ?BP: 124/67 126/77 (!) 98/52 138/84  ?Pulse: 73 96 87 79  ?Resp: '18 16 16   '$ ?Temp: 97.9 ?F (36.6 ?C) 98.4 ?F (36.9 ?C) 98.7 ?F (37.1 ?C)   ?TempSrc: Oral Oral Oral   ?SpO2: 98% 100% 100%   ?Weight: 92.3 kg     ?Height:      ? ?Supplemental O2: Room Air ?SpO2: 100 % ?O2 Flow Rate (L/min): 2 L/min ? ?Filed Weights  ? 09/27/21 1643 10/02/21 1451 10/02/21 1800  ?Weight: 97.5 kg 92.3 kg 92.3 kg  ? ? ? ?Intake/Output Summary (Last 24 hours) at 10/03/2021 0644 ?Last data filed at 10/02/2021 1800 ?Gross per 24 hour  ?Intake 200 ml  ?Output 750 ml  ?Net -550 ml  ? ?Net IO Since Admission: 468.09 mL [10/03/21 0644] ? ?Physical Exam: ?Constitutional: well-appearing, sitting in bed in no acute distress ?Cardiovascular: regular rate and rhythm, no m/r/g, TDC in place  ?Pulmonary/Chest: normal work of breathing on room air, lungs clear to auscultation bilaterally ?Abdominal: soft, non-tender, non-distended ?MSK: normal bulk and tone, band aid over biopsy site with no erythema or edema present ?Neurological: alert & oriented x 4 ?Skin: warm and dry ?Psych: normal mood and affect ? ?Patient Lines/Drains/Airways Status   ? ? Active Line/Drains/Airways   ? ? Name Placement date Placement time Site Days  ? Hemodialysis Catheter Right Internal jugular Double lumen Permanent  (Tunneled) 09/29/21  1649  Internal jugular  4  ? Incision (Closed) 05/08/17 Perineum 05/08/17  0944  -- 1609  ? ?  ?  ? ?  ? ? ?Pertinent Labs: ? ?  Latest Ref Rng & Units 10/03/2021  ?  1:18 AM 10/02/2021  ?  7:44 AM 10/01/2021  ?  2:16 AM  ?CBC  ?WBC 4.0 - 10.5 K/uL 9.3   10.5   8.7    ?Hemoglobin 13.0 - 17.0 g/dL 9.4   9.1   9.1    ?Hematocrit 39.0 - 52.0 % 27.5   26.8   25.9    ?Platelets 150 - 400 K/uL 148   172   177    ? ? ? ?  Latest Ref Rng & Units 10/03/2021  ?  1:18 AM 10/02/2021  ?  7:44 AM 10/01/2021  ?  3:43 PM  ?CMP  ?Glucose 70 - 99 mg/dL 165   129     ?BUN 8 - 23 mg/dL 36   66     ?Creatinine 0.61 - 1.24 mg/dL 6.63   10.79     ?Sodium 135 - 145 mmol/L 139   140     ?Potassium 3.5 - 5.1 mmol/L 3.0   3.0   3.3    ?Chloride 98 - 111 mmol/L 105   105     ?CO2 22 - 32 mmol/L 25   24     ?  Calcium 8.9 - 10.3 mg/dL 7.7   7.4     ? ? ?Recent Labs  ?  10/02/21 ?0800 10/02/21 ?1238 10/02/21 ?2201  ?GLUCAP 128* 170* 233*  ?  ? ?Pertinent Imaging: ?No results found. ? ?ASSESSMENT/PLAN:  ?Assessment: ?Principal Problem: ?  Uremia ? ? ?Kevin Soto is a 61 y.o. with a pertinent PMH of type 2 diabetes, prior CVA, ischemic cardiomyopathy, and CKD stage IIIa, who presented with worsening renal function and admitted for AKI on CKD. TDC was placed on 5/12 and he has received 2 HD sessions on 5/13 and 5/15. Kidney biopsy completed today to work up worsening renal function. ? ?Plan: ?AKI on CKD3b, now ESRD on HD  ?Proteinuria  ?Uremia, improved ?AGMA, resolved  ?Hypokalemia ?Dialysis catheter placed on 5/12.  Patient has completed 2 HD sessions last completed 5/15.  Creatinine down trended from 10-6 with BUN from 60-30 today.  On exam he is oriented x4.  He underwent kidney biopsy today for further evaluation of worsening renal function.  Appreciate nephrology's assistance in this case.  ANA, C3, C4, kappa lambda ratio unremarkable.  Alcohol and protein electrophoresis pending.  Nephrology is concerned that kidney  function worsening could be related to chronic disease such as diabetes or hypertension.  Renal navigator has been made aware of possible need for outpatient dialysis. ?-Nephrology following, appreciate recommendations ?-Pending fungal studies, protein electrophoresis ?-Renal navigator aware ?-Avoid nephrotoxins ?-Continue holding HCTZ, lisinopril, Jardiance and metformin ? ?Coffee ground emesis  ?Hx of normocytic anemia ?No further episodes of emesis today.  Hemoglobin stable.  He will need to have EGD prior to discharge.  GI was consulted and they think likely episode consistent with uremia. ?-Trend CBC ?-Continue Protonix 40 mg twice daily ?-GI signed off until improvement in renal function, will need EGD prior to discharge ?-Trend CBC and transfuse for hemoglobin less than 7 or hemodynamic instability ? ?HTN ?HLD ?CAD ?Ischemic cardiomyopathy ?Blood pressure stable.   ?-Continue Coreg 25 mg twice daily ?-Holding lisinopril and hydrochlorothiazide setting of AKI ?-Holding ASA 81 mg and Plavix 75 mg daily ?-Lipitor 80 mg daily ? ?T2DM ?Blood glucose range from 120-180.  1 episode of blood sugars in the 200s yesterday at 11 PM.  We will continue to monitor with goals of glucose between 120-180. ?-SSI with meal, with CBG monitoring ?-Holding metformin and Jardiance in setting of AKI ? ?Best Practice: ?Diet: Renal diet ?IVF: none ?VTE: Place and maintain sequential compression device Start: 09/29/21 1207 ?Code: Full ?AB: none ?Therapy Recs: pending ?Family Contact: unable to reach sister by phone ?DISPO: Anticipated discharge to Home pending improvement in renal function and EGD. ? ?Signature: ?Wrenna Saks M. Jennamarie Goings, D.O.  ?Internal Medicine Resident, PGY-1 ?[facility]  ?Pager: [phone] ?6:44 AM, 10/03/2021  ? ?Please contact the on call pager after 5 pm and on weekends at [phone].  ?

## 2021-10-03 NOTE — Progress Notes (Signed)
Brief nephrology note: ? ?Patient is currently in IR getting kidney biopsy.  I have discussed with the patient's bedside nurse.  The biopsy form filled and sent it to the IR for pathology. ?The patient received dialysis yesterday and lab looks much better.  Order a dose of potassium chloride for hypokalemia.  We will await for kidney biopsy result.  Continue current management.  Avoid anticoagulation will obtain.  Repeat lab in the morning. ?Continue to follow. ? ?D.  Carolin Sicks, [facility]. ?

## 2021-10-03 NOTE — Progress Notes (Addendum)
Requested to see pt for out-pt HD arrangements. Attempted to meet with pt this morning at bedside but pt was off the unit. Will attempt to speak with pt later today.  ? ?Kevin Soto ?Renal Navigator ?[phone] ? ?Addendum at 4:51 pm: ?Spoke to pt via phone this afternoon.Introduced self and explained role. Pt prefers a clinic close to his home. Pt lives in [place] therefore will proceed with referral to [facility]. Referral made this afternoon. Pt states sister will likely assist with transportation to HD appts. Pt gave navigator permission to contact sister to confirm that info. Attempted to reach pt's sister via phone but there was no answer and unable to leave a message. Will attempt again tomorrow.  ?

## 2021-10-04 ENCOUNTER — Inpatient Hospital Stay (HOSPITAL_COMMUNITY): Payer: Medicare Other | Admitting: Certified Registered Nurse Anesthetist

## 2021-10-04 ENCOUNTER — Encounter (HOSPITAL_COMMUNITY)
Admission: EM | Disposition: A | Discharge status: Home Health | Payer: Self-pay | Source: Ambulatory Visit | Attending: Internal Medicine

## 2021-10-04 ENCOUNTER — Encounter (HOSPITAL_COMMUNITY): Payer: Self-pay | Admitting: Internal Medicine

## 2021-10-04 DIAGNOSIS — K222 Esophageal obstruction: Secondary | ICD-10-CM

## 2021-10-04 DIAGNOSIS — N19 Unspecified kidney failure: Secondary | ICD-10-CM | POA: Diagnosis not present

## 2021-10-04 DIAGNOSIS — K2980 Duodenitis without bleeding: Secondary | ICD-10-CM

## 2021-10-04 DIAGNOSIS — K2100 Gastro-esophageal reflux disease with esophagitis, without bleeding: Secondary | ICD-10-CM

## 2021-10-04 DIAGNOSIS — K449 Diaphragmatic hernia without obstruction or gangrene: Secondary | ICD-10-CM

## 2021-10-04 DIAGNOSIS — K2101 Gastro-esophageal reflux disease with esophagitis, with bleeding: Secondary | ICD-10-CM

## 2021-10-04 DIAGNOSIS — N179 Acute kidney failure, unspecified: Secondary | ICD-10-CM | POA: Diagnosis not present

## 2021-10-04 DIAGNOSIS — B9681 Helicobacter pylori [H. pylori] as the cause of diseases classified elsewhere: Secondary | ICD-10-CM

## 2021-10-04 DIAGNOSIS — K3189 Other diseases of stomach and duodenum: Secondary | ICD-10-CM

## 2021-10-04 HISTORY — PX: ESOPHAGOGASTRODUODENOSCOPY (EGD) WITH PROPOFOL: SHX5813

## 2021-10-04 HISTORY — PX: BIOPSY: SHX5522

## 2021-10-04 LAB — GLUCOSE, CAPILLARY
Glucose-Capillary: 132 mg/dL — ABNORMAL HIGH (ref 70–99)
Glucose-Capillary: 147 mg/dL — ABNORMAL HIGH (ref 70–99)
Glucose-Capillary: 162 mg/dL — ABNORMAL HIGH (ref 70–99)
Glucose-Capillary: 168 mg/dL — ABNORMAL HIGH (ref 70–99)
Glucose-Capillary: 143 mg/dL — ABNORMAL HIGH (ref 70–99)
Glucose-Capillary: 140 mg/dL — ABNORMAL HIGH (ref 70–99)

## 2021-10-04 LAB — RENAL FUNCTION PANEL
Albumin: 2.5 g/dL — ABNORMAL LOW (ref 3.5–5.0)
Anion gap: 10 (ref 5–15)
BUN: 37 mg/dL — ABNORMAL HIGH (ref 8–23)
CO2: 26 mmol/L (ref 22–32)
Calcium: 8.2 mg/dL — ABNORMAL LOW (ref 8.9–10.3)
Chloride: 104 mmol/L (ref 98–111)
Creatinine, Ser: 6.9 mg/dL — ABNORMAL HIGH (ref 0.61–1.24)
GFR, Estimated: 8 mL/min — ABNORMAL LOW (ref >60)
Glucose, Bld: 124 mg/dL — ABNORMAL HIGH (ref 70–99)
Phosphorus: 2.9 mg/dL (ref 2.5–4.6)
Potassium: 3.3 mmol/L — ABNORMAL LOW (ref 3.5–5.1)
Sodium: 140 mmol/L (ref 135–145)

## 2021-10-04 LAB — CBC
HCT: 26.8 % — ABNORMAL LOW (ref 39.0–52.0)
Hemoglobin: 9.2 g/dL — ABNORMAL LOW (ref 13.0–17.0)
MCH: 28.1 pg (ref 26.0–34.0)
MCHC: 34.3 g/dL (ref 30.0–36.0)
MCV: 82 fL (ref 80.0–100.0)
Platelets: 155 10*3/uL (ref 150–400)
RBC: 3.27 MIL/uL — ABNORMAL LOW (ref 4.22–5.81)
RDW: 14.5 % (ref 11.5–15.5)
WBC: 11.5 10*3/uL — ABNORMAL HIGH (ref 4.0–10.5)
nRBC: 0 % (ref 0.0–0.2)

## 2021-10-04 LAB — PROTEIN ELECTRO, RANDOM URINE
Albumin ELP, Urine: 36.4 %
Alpha-1-Globulin, U: 2.1 %
Alpha-2-Globulin, U: 16.6 %
Beta Globulin, U: 14.5 %
Gamma Globulin, U: 30.4 %
Total Protein, Urine: 97.4 mg/dL

## 2021-10-04 SURGERY — ESOPHAGOGASTRODUODENOSCOPY (EGD) WITH PROPOFOL
Anesthesia: Monitor Anesthesia Care

## 2021-10-04 MED ORDER — SODIUM CHLORIDE 0.9 % IV SOLN
INTRAVENOUS | Status: AC | PRN
  Administered 2021-10-04: 500 mL via INTRAVENOUS

## 2021-10-04 MED ORDER — PHENYLEPHRINE HCL (PRESSORS) 10 MG/ML IV SOLN
INTRAVENOUS | Status: DC | PRN
  Administered 2021-10-04: 80 ug via INTRAVENOUS

## 2021-10-04 MED ORDER — LIDOCAINE 2% (20 MG/ML) 5 ML SYRINGE
INTRAMUSCULAR | Status: DC | PRN
  Administered 2021-10-04: 40 mg via INTRAVENOUS

## 2021-10-04 MED ORDER — SODIUM CHLORIDE 0.9 % IV SOLN: INTRAVENOUS | Status: DC | PRN

## 2021-10-04 MED ORDER — CHLORHEXIDINE GLUCONATE CLOTH 2 % EX PADS
6.0000 | MEDICATED_PAD | Freq: Every day | CUTANEOUS | Status: DC
  Administered 2021-10-04 – 2021-10-05 (×2): 6 via TOPICAL

## 2021-10-04 MED ORDER — PROPOFOL 500 MG/50ML IV EMUL
INTRAVENOUS | Status: DC | PRN
  Administered 2021-10-04: 125 ug/kg/min via INTRAVENOUS

## 2021-10-04 MED ORDER — POTASSIUM CHLORIDE CRYS ER 20 MEQ PO TBCR
40.0000 meq | EXTENDED_RELEASE_TABLET | Freq: Once | ORAL | Status: AC
  Administered 2021-10-04: 40 meq via ORAL
  Filled 2021-10-04: qty 2

## 2021-10-04 MED ORDER — PROPOFOL 10 MG/ML IV BOLUS
INTRAVENOUS | Status: DC | PRN
  Administered 2021-10-04 (×2): 20 mg via INTRAVENOUS

## 2021-10-04 MED ORDER — HEPARIN SODIUM (PORCINE) 5000 UNIT/ML IJ SOLN
5000.0000 [IU] | Freq: Three times a day (TID) | INTRAMUSCULAR | Status: DC
Start: 2021-10-04 — End: 2021-10-05
  Administered 2021-10-04 – 2021-10-05 (×4): 5000 [IU] via SUBCUTANEOUS
  Filled 2021-10-04 (×4): qty 1

## 2021-10-04 MED ORDER — CLOPIDOGREL BISULFATE 75 MG PO TABS
75.0000 mg | ORAL_TABLET | Freq: Every day | ORAL | Status: DC
  Administered 2021-10-04: 75 mg via ORAL
  Filled 2021-10-04: qty 1

## 2021-10-04 SURGICAL SUPPLY — 15 items

## 2021-10-04 NOTE — Progress Notes (Addendum)
[facility] ?NEPHROLOGY PROGRESS NOTE ? ?Assessment/ Plan: ?Pt is a 61 y.o. yo male with history of HTN, CAD, DM, stroke, ischemic cardiomyopathy, CKD 3b admitted with worsening renal failure. ? ?#Acute kidney injury on CKD stage IIIb presumably ischemic ATN in the setting of ACE inhibitor, diuretics.  It seems like his baseline creatinine level is around 1.6-2s, admitted with a creatinine level of 13 on admission.  UA with minimal protein, no RBCs, UPC 1.38 g.  Serologies unremarkable including ANA, ANCA, C3, C4, kappa lambda ratio.  Pending protein electrophoresis.  Hepatitis B, C and HIV negative. ?CT abdomen pelvis without hydro. ?Status post right IJ TDC placed on 5/12 and first HD on 5/13 and second HD on 5/15. ?He had a kidney biopsy done on 5/16, awaiting the result. ?Only 300 cc of urine output recorded however the bed was wet per nursing staff.  Creatinine level has gone off therefore we will plan for another dialysis today mainly for clearance.  No ultrafiltration. ?Continue to hold HCTZ, lisinopril, Jardiance, metformin etc. ?Strict ins and outs and daily lab. ?Given longstanding CKD, he may have a lot of chronic finding on biopsy.  Renal navigator is following to arrange outpatient dialysis for AKI.   ? ?#Proteinuria probably because of diabetes.  Serologies unremarkable. ? ?#Hypokalemia: Dialysis with high potassium bath. ? ?#Hypertension: Holding diuretics and lisinopril. ? ?#Metabolic acidosis now managing with dialysis. ? ?#Anemia, due to chronic disease/CKD: Iron repleted.  Started ESA. ? ?Addendum 4:15: because of staff scheduling, the dialysis postpone to tomorrow. D/w HD nurse. ? ?Subjective: Seen and examined at bedside.  Seen and examined at bedside.  He just came back from endoscopy.  He reports feeling good without any complaint or concern.  Urine output only 300 recorded however nurse reported that the bed was wet.   ? ?Objective ?Vital signs in last 24 hours: ?Vitals:  ?  10/04/21 1010 10/04/21 1055 10/04/21 1110 10/04/21 1125  ?BP: 126/70 (!) 88/59 95/60 (!) 105/59  ?Pulse: 84 83 87   ?Resp: '13 19 16   '$ ?Temp: (!) 97.5 ?F (36.4 ?C) 99.4 ?F (37.4 ?C)    ?TempSrc: Temporal     ?SpO2: 100% 94% 99%   ?Weight:      ?Height:      ? ?Weight change:  ? ?Intake/Output Summary (Last 24 hours) at 10/04/2021 1143 ?Last data filed at 10/04/2021 1057 ?Gross per 24 hour  ?Intake 580 ml  ?Output 300 ml  ?Net 280 ml  ? ? ? ? ? ? ?Labs: ?Basic Metabolic Panel: ?Recent Labs  ?Lab 09/29/21 ?0236 09/30/21 ?0126 09/30/21 ?1027 10/01/21 ?0216 10/02/21 ?0744 10/03/21 ?0118 10/04/21 ?0086  ?NA 137   < > 139   < > 140 139 140  ?K 3.5   < > 3.4*   < > 3.0* 3.0* 3.3*  ?CL 101   < > 106   < > 105 105 104  ?CO2 17*   < > 19*   < > '24 25 26  '$ ?GLUCOSE 128*   < > 129*   < > 129* 165* 124*  ?BUN 103*   < > 100*   < > 66* 36* 37*  ?CREATININE 13.99*   < > 14.37*   < > 10.79* 6.63* 6.90*  ?CALCIUM 7.4*   < > 6.9*   < > 7.4* 7.7* 8.2*  ?PHOS 6.5*  --  5.2*  --   --   --  2.9  ? < > = values in this  interval not displayed.  ? ? ?Liver Function Tests: ?Recent Labs  ?Lab 09/28/21 ?0236 09/30/21 ?1027 10/04/21 ?1696  ?ALBUMIN 2.5* 2.5* 2.5*  ? ? ?No results for input(s): LIPASE, AMYLASE in the last 168 hours. ?No results for input(s): AMMONIA in the last 168 hours. ?CBC: ?Recent Labs  ?Lab 09/27/21 ?1321 09/28/21 ?0236 09/28/21 ?1918 09/29/21 ?0236 09/30/21 ?0126 10/01/21 ?0216 10/02/21 ?7893 10/03/21 ?0118 10/04/21 ?8101  ?WBC 13.0*   < > 16.3*   < > 10.7* 8.7 10.5 9.3 11.5*  ?NEUTROABS 10.2*  --  13.2*  --   --   --   --   --   --   ?HGB 11.4*   < > 10.4*   < > 9.2* 9.1* 9.1* 9.4* 9.2*  ?HCT 34.2*   < > 30.9*   < > 26.9* 25.9* 26.8* 27.5* 26.8*  ?MCV 84.4   < > 85.1   < > 83.0 79.7* 82.5 82.1 82.0  ?PLT 275   < > 269   < > 221 177 172 148* 155  ? < > = values in this interval not displayed.  ? ? ?Cardiac Enzymes: ?Recent Labs  ?Lab 09/28/21 ?1520  ?CKTOTAL 155  ? ? ?CBG: ?Recent Labs  ?Lab 10/03/21 ?1704 10/03/21 ?2142  10/04/21 ?7510 10/04/21 ?1022 10/04/21 ?1056  ?GLUCAP 148* 231* 132* 147* 162*  ? ? ? ?Iron Studies: No results for input(s): IRON, TIBC, TRANSFERRIN, FERRITIN in the last 72 hours. ?Studies/Results: ?US BIOPSY (KIDNEY) ? ?Result Date: 10/03/2021 ?INDICATION: Uremia EXAM: Ultrasound-guided random renal biopsy MEDICATIONS: None. ANESTHESIA/SEDATION: Moderate (conscious) sedation was employed during this procedure. A total of Versed 1 mg and Fentanyl 25 mcg was administered intravenously by the radiology nurse. Total intra-service moderate Sedation Time: 15 minutes. The patient's level of consciousness and vital signs were monitored continuously by radiology nursing throughout the procedure under my direct supervision. COMPLICATIONS: None immediate. PROCEDURE: Informed written consent was obtained from the patient after a thorough discussion of the procedural risks, benefits and alternatives. All questions were addressed. Maximal Sterile Barrier Technique was utilized including caps, mask, sterile gowns, sterile gloves, sterile drape, hand hygiene and skin antiseptic. A timeout was performed prior to the initiation of the procedure. The patient was placed prone on the exam table. Limited ultrasound of the bilateral flanks was performed for planning purposes. The left renal lower pole was selected as appropriate for percutaneous biopsy access. Skin entry site was marked, and the overlying skin was prepped and draped in the standard sterile fashion. Local analgesia was obtained with 1% lidocaine. Using ultrasound guidance, a 15 gauge introducer needle was advanced just deep to the cortex of the lower pole. Subsequently, core needle biopsy was performed using a 16 gauge core biopsy device x2 total passes. Specimens were submitted in saline to pathology for further handling. Gel-Foam slurry was administered through the introducer needle as it was removed to assist with hemostasis. Limited postprocedure imaging demonstrated  expected post biopsy changes without hematoma. A clean dressing was placed. The patient tolerated the procedure well without immediate complication. IMPRESSION: Successful ultrasound-guided random renal core biopsy of the left renal lower pole. Electronically Signed   By: Albin Felling M.D.   On: 10/03/2021 11:43   ? ?Medications: ?Infusions: ? [MAR Hold] sodium chloride    ? [MAR Hold] sodium chloride    ? ? ?Scheduled Medications: ? [MAR Hold] atorvastatin  80 mg Oral Daily  ? [MAR Hold] carvedilol  25 mg Oral BID WC  ? [facility] Hold]  Chlorhexidine Gluconate Cloth  6 each Topical Q0600  ? Chlorhexidine Gluconate Cloth  6 each Topical Q0600  ? [MAR Hold] darbepoetin (ARANESP) injection - DIALYSIS  60 mcg Intravenous Q Mon-HD  ? [MAR Hold] dorzolamide-timolol  1 drop Both Eyes Daily  ? [MAR Hold] fluticasone  2 spray Each Nare Daily  ? [MAR Hold] insulin aspart  0-6 Units Subcutaneous TID WC  ? [MAR Hold] pantoprazole  40 mg Oral BID  ? [MAR Hold] tamsulosin  0.4 mg Oral Daily  ? ? have reviewed scheduled and prn medications. ? ?Physical Exam: ?General: Able to lie on the stretcher, comfortable, not in distress ?Heart:RRR, s1s2 nl ?Lungs: Clear b/l, no crackle ?Abdomen:soft, Non-tender, non-distended ?Extremities: No peripheral edema. ?Dialysis Access: Right IJ TDC in place. ? ?Kevin Soto ?10/04/2021,11:43 AM ? LOS: 6 days  ? ?

## 2021-10-04 NOTE — Interval H&P Note (Signed)
History and Physical Interval Note: ? ?10/04/2021 ?10:31 AM ? ?Kevin Soto  has presented today for surgery, with the diagnosis of Coffee-ground emesis, resolved.  In setting of uremia/AKI/Plavix..  The various methods of treatment have been discussed with the patient and family. After consideration of risks, benefits and other options for treatment, the patient has consented to  Procedure(s): ?ESOPHAGOGASTRODUODENOSCOPY (EGD) WITH PROPOFOL (N/A) as a surgical intervention.  The patient's history has been reviewed, patient examined, no change in status, stable for surgery.  I have reviewed the patient's chart and labs.  Questions were answered to the patient's satisfaction.   ? ? ?[name] ? ? ?

## 2021-10-04 NOTE — Progress Notes (Signed)
Mobility Specialist Progress Note: ? ? 10/04/21 0918  ?Mobility  ?Activity Ambulated with assistance in hallway  ?Level of Assistance Minimal assist, patient does 75% or more  ?Assistive Device Front wheel walker  ?Distance Ambulated (ft) 120 ft  ?Activity Response Tolerated well  ?$Mobility charge 1 Mobility  ? ?Pt received in bed willing to participate in mobility. No complaints of pain. MinA to stand then contact guard throughout ambulation. Left in chair with call bell in reach and all needs met. Chair alarm on.  ? ?Kevin Soto ?Mobility Specialist ?Primary Phone [phone] ? ?

## 2021-10-04 NOTE — Op Note (Signed)
[facility] ?Patient Name: Kevin Soto ?Procedure Date : 10/04/2021 ?MRN: [id number] ?Attending MD: Gerrit Heck , MD ?Date of Birth: 1960/08/20 ?CSN: [id number] ?Age: 61 ?Admit Type: Inpatient ?Procedure:                Upper GI endoscopy ?Indications:              Coffee-ground emesis ?                          History of gastric ulcers and Helicobacter pylori ?                          Evaluate for gastric ulcers, H pylori eradication,  ?                          and high grade stigmata of bleeding prior to  ?                          restarting antiplatelet therapy. ?Providers:                Gerrit Heck, MD, Grace Isaac, RN, Jacquelin Hawking  ?                          Houle, Technician ?Referring MD:              ?Medicines:                Monitored Anesthesia Care ?Complications:            No immediate complications. ?Estimated Blood Loss:     Estimated blood loss was minimal. ?Procedure:                Pre-Anesthesia Assessment: ?                          - Prior to the procedure, a History and Physical  ?                          was performed, and patient medications and  ?                          allergies were reviewed. The patient's tolerance of  ?                          previous anesthesia was also reviewed. The risks  ?                          and benefits of the procedure and the sedation  ?                          options and risks were discussed with the patient.  ?                          All questions were answered, and informed consent  ?                          was obtained. Prior Anticoagulants: The  patient has  ?                          taken Plavix (clopidogrel), last dose was 7 days  ?                          prior to procedure. ASA Grade Assessment: III - A  ?                          patient with severe systemic disease. After  ?                          reviewing the risks and benefits, the patient was  ?                          deemed in satisfactory condition  to undergo the  ?                          procedure. ?                          After obtaining informed consent, the endoscope was  ?                          passed under direct vision. Throughout the  ?                          procedure, the patient's blood pressure, pulse, and  ?                          oxygen saturations were monitored continuously. The  ?                          [id number]-[id number] ([id number]) Olympus endoscope was introduced  ?                          through the mouth, and advanced to the second part  ?                          of duodenum. The upper GI endoscopy was  ?                          accomplished without difficulty. The patient  ?                          tolerated the procedure well. ?Scope In: ?Scope Out: ?Findings: ?     A non-obstructing and mild Schatzki ring was found in the lower third of  ?     the esophagus. This was easily traversed. ?     Mild LA Grade A (one or more mucosal breaks less than 5 mm, not  ?     extending between tops of 2 mucosal folds) esophagitis with no bleeding  ?     was found 39 cm from the incisors. ?     A 3 cm hiatal hernia was present. ?  The upper third of the esophagus and middle third of the esophagus were  ?     normal. ?     The entire examined stomach was normal. Biopsies were taken with a cold  ?     forceps for Helicobacter pylori testing. Estimated blood loss was  ?     minimal. ?     Localized mildly erythematous mucosa without active bleeding and with no  ?     stigmata of bleeding was found in the duodenal bulb. Biopsies were taken  ?     with a cold forceps for histology. Estimated blood loss was minimal. The  ?     remainder of the duodenum was normal appearing. ?Impression:               - Non-obstructing and mild Schatzki ring. ?                          - LA Grade A reflux esophagitis with no bleeding. ?                          - 3 cm hiatal hernia. ?                          - Normal upper third of esophagus and middle third  ?                           of esophagus. ?                          - Normal stomach. Biopsied. ?                          - Erythematous duodenopathy. Biopsied. ?Recommendation:           - Return patient to hospital ward for ongoing care. ?                          - Advance diet as tolerated. ?                          - Continue present medications. ?                          - Continue Protonix 40 mg PO BID x6 weeks, then  ?                          reduce to 40 mg daily and titrate to lowest  ?                          effective dose to control reflux symptoms. ?                          - Await pathology results. ?                          - GI service will sign off at this time. Please do  ?  not hesitate to contact with additional questions  ?                          or concerns. ?Procedure Code(s):        --- Professional --- ?                          [phone], Esophagogastroduodenoscopy, flexible,  ?                          transoral; with biopsy, single or multiple ?Diagnosis Code(s):        --- Professional --- ?                          K22.2, Esophageal obstruction ?                          K21.00, Gastro-esophageal reflux disease with  ?                          esophagitis, without bleeding ?                          K44.9, Diaphragmatic hernia without obstruction or  ?                          gangrene ?                          K31.89, Other diseases of stomach and duodenum ?                          K92.0, Hematemesis ?                          L89.37, Helicobacter pylori [H. pylori] as the  ?                          cause of diseases classified elsewhere ?CPT copyright 2019 [employer]. All rights reserved. ?The codes documented in this report are preliminary and upon coder review may  ?be revised to meet current compliance requirements. ?Gerrit Heck, MD ?10/04/2021 11:06:12 AM ?Number of Addenda: 0 ?

## 2021-10-04 NOTE — Anesthesia Postprocedure Evaluation (Signed)
Anesthesia Post Note ? ?Patient: Kevin Soto ? ?Procedure(s) Performed: ESOPHAGOGASTRODUODENOSCOPY (EGD) WITH PROPOFOL ?BIOPSY ? ?  ? ?Patient location during evaluation: PACU ?Anesthesia Type: MAC ?Level of consciousness: awake and alert ?Pain management: pain level controlled ?Vital Signs Assessment: post-procedure vital signs reviewed and stable ?Respiratory status: spontaneous breathing, nonlabored ventilation, respiratory function stable and patient connected to nasal cannula oxygen ?Cardiovascular status: stable and blood pressure returned to baseline ?Postop Assessment: no apparent nausea or vomiting ?Anesthetic complications: no ? ? ?No notable events documented. ? ?Last Vitals:  ?Vitals:  ? 10/04/21 1125 10/04/21 1150  ?BP: (!) 105/59 104/66  ?Pulse: 85 77  ?Resp:  17  ?Temp: 36.7 ?C 36.8 ?C  ?SpO2: 96% 100%  ?  ?Last Pain:  ?Vitals:  ? 10/04/21 1150  ?TempSrc: Oral  ?PainSc:   ? ? ?  ?  ?  ?  ?  ?  ? ?Effie Berkshire ? ? ? ? ?

## 2021-10-04 NOTE — H&P (View-Only) (Signed)
? ? Attending physician's note  ? ?I have taken a history, reviewed the chart, and examined the patient. I performed a substantive portion of this encounter, including complete performance of at least one of the key components, in conjunction with the APP. I agree with the APP's note, impression, and recommendations with my edits.  ? ?History reviewed as outlined below.  GI service was reconsulted for consideration of EGD prior to discharge planning.  Has been holding Plavix for renal biopsy yesterday along with anticipated EGD prior to discharge.   ? ?-Plan for EGD today for evaluation of recent coffee-ground emesis, prior history of H. pylori, and to rule out high-grade stigmata of bleeding prior to resuming Plavix. ? ?The indications, risks, and benefits of EGD were explained to the patient in detail. Risks include but are not limited to bleeding, perforation, adverse reaction to medications, and cardiopulmonary compromise. Sequelae include but are not limited to the possibility of surgery, hospitalization, and mortality. The patient verbalized understanding and wished to proceed.  ? ? ?[address], DO, FACG ?[phone] office  ? ?   ? ? ? ?       Daily Rounding Note ? ?10/04/2021, 9:21 AM ? LOS: 6 days  ? ?SUBJECTIVE:   ?Chief complaint:   Follow-up of CGE. ? ?Patient originally consulted on 5/11 with Dr. Loletha Carrow re CGE.  DAPT with Plavix/ASA, no regular PPI at home.  Initially admitted with AKI, azotemia.  2018 history of nonbleeding DU, gastritis and H. pylori.  Not clear that H. pylori was ever eradicated.  Eventually with therapies he was tolerating solid food, EGD was nonemergent and plan to perform this prior to patient's discharge.  Now requiring dialysis, started 5/13 with session #2 on 5/15.  For presumed ischemic ATN in setting ACE inhibitors, diuretics.  S/p renal biopsy.  Plavix has been on hold for more than 5 days. ? ?OBJECTIVE:        ?  Vital signs in last 24 hours:    ?Temp:  [98 ?F (36.7 ?C)-98.9 ?F (37.2 ?C)] 98 ?F (36.7 ?C) (05/17 0739) ?Pulse Rate:  [72-89] 84 (05/17 0739) ?Resp:  [11-17] 17 (05/17 0739) ?BP: (117-138)/(64-88) 138/88 (05/17 0739) ?SpO2:  [96 %-100 %] 99 % (05/17 0739) ?Last BM Date : 10/02/21 ?Filed Weights  ? 09/27/21 1643 10/02/21 1451 10/02/21 1800  ?Weight: 97.5 kg 92.3 kg 92.3 kg  ? ?General: Patient looks well.  Alert.  Comfortable. ?Heart: RRR.  Dialysis catheter present on upper right chest ?Chest: Clear bilaterally without labored breathing or cough ?Abdomen: Active bowel sounds.  Soft without distention. ?Extremities: No CCE. ?Neuro/Psych: Pleasant, calm.  Fully oriented. ? ?Intake/Output from previous day: ?05/16 0701 - 05/17 0700 ?In: 480 [P.O.:480] ?Out: 300 [Urine:300] ? ?Intake/Output this shift: ?No intake/output data recorded. ? ?Lab Results: ?Recent Labs  ?  10/02/21 ?9024 10/03/21 ?0118 10/04/21 ?0973  ?WBC 10.5 9.3 11.5*  ?HGB 9.1* 9.4* 9.2*  ?HCT 26.8* 27.5* 26.8*  ?PLT 172 148* 155  ? ?BMET ?Recent Labs  ?  10/02/21 ?0744 10/03/21 ?0118 10/04/21 ?5329  ?NA 140 139 140  ?K 3.0* 3.0* 3.3*  ?CL 105 105 104  ?CO2 '24 25 26  '$ ?GLUCOSE 129* 165* 124*  ?BUN 66* 36* 37*  ?CREATININE 10.79* 6.63* 6.90*  ?CALCIUM 7.4* 7.7* 8.2*  ? ?LFT ?Recent Labs  ?  10/04/21 ?9242  ?ALBUMIN 2.5*  ? ?PT/INR ?Recent Labs  ?  10/03/21 ?1330  ?LABPROT 14.5  ?INR 1.1  ? ?Hepatitis Panel ?No  results for input(s): HEPBSAG, HCVAB, HEPAIGM, HEPBIGM in the last 72 hours. ? ?Studies/Results: ?US BIOPSY (KIDNEY) ? ?Result Date: 10/03/2021 ?INDICATION: Uremia EXAM: Ultrasound-guided random renal biopsy MEDICATIONS: None. ANESTHESIA/SEDATION: Moderate (conscious) sedation was employed during this procedure. A total of Versed 1 mg and Fentanyl 25 mcg was administered intravenously by the radiology nurse. Total intra-service moderate Sedation Time: 15 minutes. The patient's level of consciousness and vital signs were monitored continuously by  radiology nursing throughout the procedure under my direct supervision. COMPLICATIONS: None immediate. PROCEDURE: Informed written consent was obtained from the patient after a thorough discussion of the procedural risks, benefits and alternatives. All questions were addressed. Maximal Sterile Barrier Technique was utilized including caps, mask, sterile gowns, sterile gloves, sterile drape, hand hygiene and skin antiseptic. A timeout was performed prior to the initiation of the procedure. The patient was placed prone on the exam table. Limited ultrasound of the bilateral flanks was performed for planning purposes. The left renal lower pole was selected as appropriate for percutaneous biopsy access. Skin entry site was marked, and the overlying skin was prepped and draped in the standard sterile fashion. Local analgesia was obtained with 1% lidocaine. Using ultrasound guidance, a 15 gauge introducer needle was advanced just deep to the cortex of the lower pole. Subsequently, core needle biopsy was performed using a 16 gauge core biopsy device x2 total passes. Specimens were submitted in saline to pathology for further handling. Gel-Foam slurry was administered through the introducer needle as it was removed to assist with hemostasis. Limited postprocedure imaging demonstrated expected post biopsy changes without hematoma. A clean dressing was placed. The patient tolerated the procedure well without immediate complication. IMPRESSION: Successful ultrasound-guided random renal core biopsy of the left renal lower pole. Electronically Signed   By: Albin Felling M.D.   On: 10/03/2021 11:43   ? ?Scheduled Meds: ? atorvastatin  80 mg Oral Daily  ? carvedilol  25 mg Oral BID WC  ? Chlorhexidine Gluconate Cloth  6 each Topical Q0600  ? darbepoetin (ARANESP) injection - DIALYSIS  60 mcg Intravenous Q Mon-HD  ? dorzolamide-timolol  1 drop Both Eyes Daily  ? fluticasone  2 spray Each Nare Daily  ? insulin aspart  0-6 Units  Subcutaneous TID WC  ? pantoprazole  40 mg Oral BID  ? tamsulosin  0.4 mg Oral Daily  ? ?Continuous Infusions: ? sodium chloride    ? sodium chloride    ? ?PRN Meds:.sodium chloride, sodium chloride, acetaminophen **OR** acetaminophen, alteplase, lidocaine (PF), lidocaine-prilocaine, pentafluoroprop-tetrafluoroeth, senna-docusate  ? ?ASSESMENT:  ? ?CGE in setting of uremia/AKI/Plavix.  Nausea, vomiting resolved.  History of nonbleeding duodenal ulcer, gastritis and positive H. pylori in 2018.  The H. pylori does not appear to have been treated.  Recent acute symptoms all resolved with bid PPI but patient needs to go back on Plavix. ? ?AKI.  Has had 2 sessions of hemodialysis so far.  BUNs/creatinine 37/6.9, GFR 8 today. ? ?Hypokalemia with K 3.3. ? ?Normocytic anemia.  Initial Hb 11.4 in setting of volume depletion, remains stable in the low 9 region, 9.2 today. ? ? ?PLAN  ? ?EGD today.  Patient agreeable.  He is n.p.o. other than some sips of water this morning. ? ? ? ?Azucena Freed  10/04/2021, 9:21 AM ?Phone [phone]  ?

## 2021-10-04 NOTE — Progress Notes (Signed)
PT Cancellation Note ? ?Patient Details ?Name: Kevin Soto ?MRN: [id number] ?DOB: 12/14/1960 ? ? ?Cancelled Treatment:    Reason Eval/Treat Not Completed: Patient at procedure or test/unavailable (EGD) ? ?Wyona Almas, PT, DPT ?Acute Rehabilitation Services ?Pager [phone] ?Office [phone] ? ? ? ?[name] ?10/04/2021, 10:36 AM ? ? ?

## 2021-10-04 NOTE — Transfer of Care (Signed)
Immediate Anesthesia Transfer of Care Note ? ?Patient: Kevin Soto ? ?Procedure(s) Performed: ESOPHAGOGASTRODUODENOSCOPY (EGD) WITH PROPOFOL ?BIOPSY ? ?Patient Location: PACU ? ?Anesthesia Type:MAC ? ?Level of Consciousness: drowsy, patient cooperative and responds to stimulation ? ?Airway & Oxygen Therapy: Patient Spontanous Breathing and Patient connected to nasal cannula oxygen ? ?Post-op Assessment: Report given to RN and Post -op Vital signs reviewed and stable ? ?Post vital signs: Reviewed and stable ? ?Last Vitals:  ?Vitals Value Taken Time  ?BP 88/59 10/04/21 1056  ?Temp    ?Pulse 89 10/04/21 1057  ?Resp 26 10/04/21 1057  ?SpO2 97 % 10/04/21 1057  ?Vitals shown include unvalidated device data. ? ?Last Pain:  ?Vitals:  ? 10/04/21 1010  ?TempSrc: Temporal  ?PainSc: 0-No pain  ?   ? ?  ? ?Complications: No notable events documented. ?

## 2021-10-04 NOTE — Progress Notes (Signed)
Mobility Specialist Progress Note: ? ? 10/04/21 1749  ?Mobility  ?Activity Ambulated with assistance in room;Transferred from chair to bed  ?Level of Assistance Contact guard assist, steadying assist  ?Assistive Device  ?(HHA)  ?Distance Ambulated (ft) 6 ft  ?Activity Response Tolerated well  ?$Mobility charge 1 Mobility  ? ?Pt received in chair asking to go back to bed. Pt had blood on his arm and 2 small puddles on the ground, RN notified. Cleaned up pt and put him back to bed. No complaints of pain or dizziness/lightheadedness. Left in bed with call bell in reach and all needs met.  ? ?Shavette Shoaff ?Mobility Specialist ?Primary Phone [phone] ? ?

## 2021-10-04 NOTE — Progress Notes (Signed)
Plan of care.  ? ?Renal biopsy, prelim results ? ?There was 20 to 30% cortex with the largest component representing medulla. There were five glomeruli. There was tubular injury with occasional myoglobin casts. He had moderate diabetic changes. The Chronicity was approximately moderate. He had 30 to 40% interstitial, fibrosis and tubular atrophy ? ?I did stop his atorvastatin and communicated the result with the nephrologist  on service

## 2021-10-04 NOTE — Progress Notes (Addendum)
Pt's case under review by [facility] for out-pt HD needs. Attempted to reach pt's sister again via phone but there was no answer and unable to leave a message. Sent a HIPPA compliant text to pt's sister requesting a return call. Will assist as needed.  ? ?Melven Sartorius ?Renal Navigator ?[phone] ? ?Addendum at 3:31 pm: ?Received a call from pt's sister, Kevin Soto. Discussed with pt's sister pt's need for out-pt HD and she is aware and agreeable. Explained my role. Sister confirms that she can provide transportation for pt to/from HD. Awaiting final approval and will discuss with pt and pt's sister once obtained.  ?

## 2021-10-04 NOTE — Progress Notes (Signed)
Pt transported off unit for EGD via wheelchair. Consent signed before leaving.  ?

## 2021-10-04 NOTE — Progress Notes (Signed)
   Subjective: No acute overnight events.   Patient was seen at bedside during his HD session during rounds today. Resting comfortably in bed, feels well, and has no complaints. Denies confusion, SHOB, chest pain, N/V, and abdominal pain. He appears much improved compared to admission.   Pt is updated on the plan for today, and all questions and concerns are addressed.   Objective:  Vital signs in last 24 hours: Vitals:   10/04/21 1110 10/04/21 1115 10/04/21 1125 10/04/21 1150  BP: 95/60 95/60 (!) 105/59 104/66  Pulse: 87 85 85 77  Resp: 16 (!) 21  17  Temp:   98 F (36.7 C) 98.3 F (36.8 C)  TempSrc:    Oral  SpO2: 99% 96% 96% 100%  Weight:      Height:       General: Pleasant elderly gentleman laying in bed. No acute distress. CV: RRR. No murmurs, rubs, or gallops. No LE edema Pulmonary: Lungs CTAB. Normal effort. No wheezing or rales. Abdominal: Soft, nontender, nondistended. Normal bowel sounds. Skin: Warm and dry  Neuro: A&Ox3. Appropriate responses.    Assessment/Plan:  Principal Problem:   Uremia Active Problems:   Acute renal failure (HCC)   Duodenitis   Gastroesophageal reflux disease with esophagitis without hemorrhage   Hiatal hernia  Kevin Soto is a 61 y.o. male with a history of HTN, CAD, DM, prior CVA, ischemic cardiomyopathy, and CKD stage IIIB admitted for AKI on CKD3b, now ESRD on HD, s/p renal biopsy 5/16.    AKI on CKD3b, now ESRD on TTS HD Proteinuria  Uremia, improved AGMA, resolved  Improvement in mental status with ongoing HD sessions. Renal function improved since admission. Cr improved from 6.9 -> 6.2 with HD session yesterday, BUN is stable 37 ->39. Reported urine output  is 500 cc yesterday. Received HD again today, and is approved for TTS OP HD schedule. Workup for rapidly progressive kidney disease reassuring. Renal biopsy showing chronic diabetic changes, moderate chronicity. Anticipate d/c to home later today.  --Awaiting final  nephrology recommendations  --S/p HD today; on schedule for TTS OP HD sessions   --Avoid nephrotoxins  --Holding HCTZ, lisinopril, Jardiance and metformin   Hypokalemia  K 3.2 -- repleted with Kdur 40 mEq BID   Coffee ground emesis, resolved  Hx of normocytic anemia No emesis since admission, and no s/s of bleeding. EGD this admission was reassuring. Coffee-ground emesis was likely from uremia associated platelet dysfunction.  --Continue home Plavix and ASA  --Continue Protonix 40 mg PO BID x6 weeks, then reduce to 40 mg daily lowest effective dose to control reflux symptoms. --GI signed off  --Trend CBC and transfuse for Hgb <7 or hemodynamic instability   HTN HLD CAD Ischemic cardiomyopathy Hx of uncontrolled BP on multiple antihypertensive regimen. BP stable. Patient euvolemic on exam.  --Continue Coreg 25 mg BID --Holding lisinopril and HCTZ in setting of AKI  --Resumed home ASA 81 mg and Plavix 75 mg daily  --Lipitor 80 mg daily discontinued per nephrology based on renal biopsy results    T2DM Relatively well controlled with 5/10 A1c 7.3%.   --SSI with meal, with CBG monitoring  --Holding metformin and Jardiance as above     Best Practice: Diet: Renal IVF: NS,100cc/hr VTE: SCDs Code: Full   Lajean Manes, MD  Internal Medicine Resident, PGY-1 Pager: [phone] After 5pm on weekdays and 1pm on weekends: On Call pager [phone]

## 2021-10-04 NOTE — Anesthesia Preprocedure Evaluation (Addendum)
Anesthesia Evaluation  ?Patient identified by MRN, date of birth, ID band ?Patient awake ? ? ? ?Reviewed: ?Allergy & Precautions, NPO status , Patient's Chart, lab work & pertinent test results ? ?Airway ?Mallampati: III ? ?TM Distance: >3 FB ?Neck ROM: Full ? ? ? Dental ? ?(+) Teeth Intact, Dental Advisory Given ?  ?Pulmonary ?sleep apnea ,  ?  ?breath sounds clear to auscultation ? ? ? ? ? ? Cardiovascular ?hypertension, Pt. on home beta blockers and Pt. on medications ?+ CAD  ? ?Rhythm:Regular Rate:Normal ? ? ?  ?Neuro/Psych ?CVA   ? GI/Hepatic ?negative GI ROS, Neg liver ROS,   ?Endo/Other  ?diabetes ? Renal/GU ?Renal disease  ? ?  ?Musculoskeletal ?negative musculoskeletal ROS ?(+)  ? Abdominal ?Normal abdominal exam  (+)   ?Peds ? Hematology ?negative hematology ROS ?(+)   ?Anesthesia Other Findings ? ? Reproductive/Obstetrics ? ?  ? ? ? ? ? ? ? ? ? ? ? ? ? ?  ?  ? ? ? ? ? ? ? ?Anesthesia Physical ?Anesthesia Plan ? ?ASA: 2 ? ?Anesthesia Plan: MAC  ? ?Post-op Pain Management:   ? ?Induction: Intravenous ? ?PONV Risk Score and Plan: 0 and Propofol infusion ? ?Airway Management Planned: Natural Airway and Simple Face Mask ? ?Additional Equipment: None ? ?Intra-op Plan:  ? ?Post-operative Plan:  ? ?Informed Consent: I have reviewed the patients History and Physical, chart, labs and discussed the procedure including the risks, benefits and alternatives for the proposed anesthesia with the patient or authorized representative who has indicated his/her understanding and acceptance.  ? ? ? ? ? ?Plan Discussed with: CRNA ? ?Anesthesia Plan Comments:   ? ? ? ? ? ?Anesthesia Quick Evaluation ? ?

## 2021-10-04 NOTE — Evaluation (Signed)
Physical Therapy Evaluation ?Patient Details ?Name: Kevin Soto ?MRN: [id number] ?DOB: [birth year] ?Today's Date: 10/04/2021 ? ?History of Present Illness ? Pt is a 61 y.o. M who presents 5/10 with renal failure requiring HD. Significant PMH: HTN, T2DM, CKD3.  ?Clinical Impression ? PTA, pt lives with his sister, uses a walker intermittently and is independent with ADL's. Pt presents with generalized weakness, decreased endurance, and impaired dynamic balance. Pt requiring min guard-min assist for ambulating x 150 ft with a walker. Demonstrates shuffling gait pattern with decreased bilateral foot clearance and gait speed, thus making him at high risk for falls. Recommend HHPT at discharge to address deficits and maximize functional mobility. ?   ? ?Recommendations for follow up therapy are one component of a multi-disciplinary discharge planning process, led by the attending physician.  Recommendations may be updated based on patient status, additional functional criteria and insurance authorization. ? ?Follow Up Recommendations Home health PT ? ?  ?Assistance Recommended at Discharge PRN  ?Patient can return home with the following ? A little help with walking and/or transfers;A little help with bathing/dressing/bathroom;Assistance with cooking/housework;Assist for transportation;Help with stairs or ramp for entrance ? ?  ?Equipment Recommendations None recommended by PT  ?Recommendations for Other Services ?    ?  ?Functional Status Assessment Patient has had a recent decline in their functional status and demonstrates the ability to make significant improvements in function in a reasonable and predictable amount of time.  ? ?  ?Precautions / Restrictions Precautions ?Precautions: Fall ?Precaution Comments: pt. reports one fall in the past year. ?Restrictions ?Weight Bearing Restrictions: No  ? ?  ? ?Mobility ? Bed Mobility ?Overal bed mobility: Modified Independent ?  ?  ?  ?  ?  ?  ?  ?  ? ?Transfers ?Overall  transfer level: Needs assistance ?Equipment used: Rolling walker (2 wheels) ?Transfers: Sit to/from Stand ?Sit to Stand: Supervision, Min guard ?  ?  ?  ?  ?  ?  ?  ? ?Ambulation/Gait ?Ambulation/Gait assistance: Min assist, Min guard ?Gait Distance (Feet): 150 Feet ?Assistive device: Rolling walker (2 wheels) ?Gait Pattern/deviations: Step-to pattern, Step-through pattern, Decreased stride length, Decreased dorsiflexion - right, Decreased dorsiflexion - left, Narrow base of support, Shuffle ?Gait velocity: decreased ?Gait velocity interpretation: <1.8 ft/sec, indicate of risk for recurrent falls ?  ?General Gait Details: Cues for keeping feet on inside of walker, walker proximity, increased stride length. Min guard-minA for dynamic balance ? ?Stairs ?  ?  ?  ?  ?  ? ?Wheelchair Mobility ?  ? ?Modified Rankin (Stroke Patients Only) ?  ? ?  ? ?Balance Overall balance assessment: Needs assistance ?Sitting-balance support: Feet supported ?Sitting balance-Leahy Scale: Good ?  ?  ?Standing balance support: Bilateral upper extremity supported ?Standing balance-Leahy Scale: Poor ?Standing balance comment: reliant on RW ?  ?  ?  ?  ?  ?  ?  ?  ?  ?  ?  ?   ? ? ? ?Pertinent Vitals/Pain Pain Assessment ?Pain Assessment: No/denies pain  ? ? ?Home Living Family/patient expects to be discharged to:: Private residence ?Living Arrangements: Other relatives (sister) ?Available Help at Discharge: Family ?Type of Home: House ?Home Access: Stairs to enter ?Entrance Stairs-Rails: Right ?Entrance Stairs-Number of Steps: 4 ?  ?Home Layout: One level ?Home Equipment: Conservation officer, nature (2 wheels);Cane - single point;Shower seat ?   ?  ?Prior Function Prior Level of Function : Needs assist ?  ?  ?  ?  ?  ?  ?  Mobility Comments: using walker intermittently ?ADLs Comments: pt independent ADL's, sister does cooking/cleaning/laundry, pt does not drive ?  ? ? ?Hand Dominance  ? Dominant Hand: Right ? ?  ?Extremity/Trunk Assessment  ? Upper Extremity  Assessment ?Upper Extremity Assessment: Defer to OT evaluation ?  ? ?Lower Extremity Assessment ?Lower Extremity Assessment: Generalized weakness ?  ? ?   ?Communication  ? Communication: No difficulties  ?Cognition Arousal/Alertness: Awake/alert ?Behavior During Therapy: Flat affect ?Overall Cognitive Status: No family/caregiver present to determine baseline cognitive functioning ?  ?  ?  ?  ?  ?  ?  ?  ?  ?  ?  ?  ?  ?  ?  ?  ?General Comments: ?mild cognitive delay ?  ?  ? ?  ?General Comments   ? ?  ?Exercises    ? ?Assessment/Plan  ?  ?PT Assessment Patient needs continued PT services  ?PT Problem List Decreased strength;Decreased balance;Decreased mobility;Decreased activity tolerance ? ?   ?  ?PT Treatment Interventions DME instruction;Gait training;Stair training;Functional mobility training;Therapeutic activities;Therapeutic exercise;Balance training;Patient/family education   ? ?PT Goals (Current goals can be found in the Care Plan section)  ?Acute Rehab PT Goals ?Patient Stated Goal: to walk ?PT Goal Formulation: With patient ?Time For Goal Achievement: 10/18/21 ?Potential to Achieve Goals: Good ? ?  ?Frequency Min 3X/week ?  ? ? ?Co-evaluation   ?  ?  ?  ?  ? ? ?  ?AM-PAC PT "6 Clicks" Mobility  ?Outcome Measure Help needed turning from your back to your side while in a flat bed without using bedrails?: None ?Help needed moving from lying on your back to sitting on the side of a flat bed without using bedrails?: None ?Help needed moving to and from a bed to a chair (including a wheelchair)?: A Little ?Help needed standing up from a chair using your arms (e.g., wheelchair or bedside chair)?: A Little ?Help needed to walk in hospital room?: A Little ?Help needed climbing 3-5 steps with a railing? : A Lot ?6 Click Score: 19 ? ?  ?End of Session Equipment Utilized During Treatment: Gait belt ?Activity Tolerance: Patient tolerated treatment well ?Patient left: in bed;with call bell/phone within reach;Other  (comment) (per NT request) ?Nurse Communication: Mobility status ?PT Visit Diagnosis: Unsteadiness on feet (R26.81);Difficulty in walking, not elsewhere classified (R26.2) ?  ? ?Time: 5784-6962 ?PT Time Calculation (min) (ACUTE ONLY): 32 min ? ? ?Charges:   PT Evaluation ?$PT Eval Moderate Complexity: 1 Mod ?PT Treatments ?$Gait Training: 8-22 mins ?  ?   ? ? ?Wyona Almas, PT, DPT ?Acute Rehabilitation Services ?Pager [phone] ?Office [phone] ? ? ?Kevin Soto ?10/04/2021, 4:58 PM ? ?

## 2021-10-04 NOTE — Progress Notes (Addendum)
? ? Attending physician's note  ? ?I have taken a history, reviewed the chart, and examined the patient. I performed a substantive portion of this encounter, including complete performance of at least one of the key components, in conjunction with the APP. I agree with the APP's note, impression, and recommendations with my edits.  ? ?History reviewed as outlined below.  GI service was reconsulted for consideration of EGD prior to discharge planning.  Has been holding Plavix for renal biopsy yesterday along with anticipated EGD prior to discharge.   ? ?-Plan for EGD today for evaluation of recent coffee-ground emesis, prior history of H. pylori, and to rule out high-grade stigmata of bleeding prior to resuming Plavix. ? ?The indications, risks, and benefits of EGD were explained to the patient in detail. Risks include but are not limited to bleeding, perforation, adverse reaction to medications, and cardiopulmonary compromise. Sequelae include but are not limited to the possibility of surgery, hospitalization, and mortality. The patient verbalized understanding and wished to proceed.  ? ? ?[address] DO, FACG ?([phone] office  ? ?   ? ? ? ?       Daily Rounding Note ? ?10/04/2021, 9:21 AM ? LOS: 6 days  ? ?SUBJECTIVE:   ?Chief complaint:   Follow-up of CGE. ? ?Patient originally consulted on 5/11 with Dr. Loletha Carrow re CGE.  DAPT with Plavix/ASA, no regular PPI at home.  Initially admitted with AKI, azotemia.  2018 history of nonbleeding DU, gastritis and H. pylori.  Not clear that H. pylori was ever eradicated.  Eventually with therapies he was tolerating solid food, EGD was nonemergent and plan to perform this prior to patient's discharge.  Now requiring dialysis, started 5/13 with session #2 on 5/15.  For presumed ischemic ATN in setting ACE inhibitors, diuretics.  S/p renal biopsy.  Plavix has been on hold for more than 5 days. ? ?OBJECTIVE:        ?  Vital signs in last 24 hours:    ?Temp:  [98 ?F (36.7 ?C)-98.9 ?F (37.2 ?C)] 98 ?F (36.7 ?C) (05/17 0739) ?Pulse Rate:  [72-89] 84 (05/17 0739) ?Resp:  [11-17] 17 (05/17 0739) ?BP: (117-138)/(64-88) 138/88 (05/17 0739) ?SpO2:  [96 %-100 %] 99 % (05/17 0739) ?Last BM Date : 10/02/21 ?Filed Weights  ? 09/27/21 1643 10/02/21 1451 10/02/21 1800  ?Weight: 97.5 kg 92.3 kg 92.3 kg  ? ?General: Patient looks well.  Alert.  Comfortable. ?Heart: RRR.  Dialysis catheter present on upper right chest ?Chest: Clear bilaterally without labored breathing or cough ?Abdomen: Active bowel sounds.  Soft without distention. ?Extremities: No CCE. ?Neuro/Psych: Pleasant, calm.  Fully oriented. ? ?Intake/Output from previous day: ?05/16 0701 - 05/17 0700 ?In: 480 [P.O.:480] ?Out: 300 [Urine:300] ? ?Intake/Output this shift: ?No intake/output data recorded. ? ?Lab Results: ?Recent Labs  ?  10/02/21 ?6468 10/03/21 ?0118 10/04/21 ?0321  ?WBC 10.5 9.3 11.5*  ?HGB 9.1* 9.4* 9.2*  ?HCT 26.8* 27.5* 26.8*  ?PLT 172 148* 155  ? ?BMET ?Recent Labs  ?  10/02/21 ?0744 10/03/21 ?0118 10/04/21 ?2248  ?NA 140 139 140  ?K 3.0* 3.0* 3.3*  ?CL 105 105 104  ?CO2 '24 25 26  '$ ?GLUCOSE 129* 165* 124*  ?BUN 66* 36* 37*  ?CREATININE 10.79* 6.63* 6.90*  ?CALCIUM 7.4* 7.7* 8.2*  ? ?LFT ?Recent Labs  ?  10/04/21 ?2500  ?ALBUMIN 2.5*  ? ?PT/INR ?Recent Labs  ?  10/03/21 ?1330  ?LABPROT 14.5  ?INR 1.1  ? ?Hepatitis Panel ?No  results for input(s): HEPBSAG, HCVAB, HEPAIGM, HEPBIGM in the last 72 hours. ? ?Studies/Results: ?US BIOPSY (KIDNEY) ? ?Result Date: 10/03/2021 ?INDICATION: Uremia EXAM: Ultrasound-guided random renal biopsy MEDICATIONS: None. ANESTHESIA/SEDATION: Moderate (conscious) sedation was employed during this procedure. A total of Versed 1 mg and Fentanyl 25 mcg was administered intravenously by the radiology nurse. Total intra-service moderate Sedation Time: 15 minutes. The patient's level of consciousness and vital signs were monitored continuously by  radiology nursing throughout the procedure under my direct supervision. COMPLICATIONS: None immediate. PROCEDURE: Informed written consent was obtained from the patient after a thorough discussion of the procedural risks, benefits and alternatives. All questions were addressed. Maximal Sterile Barrier Technique was utilized including caps, mask, sterile gowns, sterile gloves, sterile drape, hand hygiene and skin antiseptic. A timeout was performed prior to the initiation of the procedure. The patient was placed prone on the exam table. Limited ultrasound of the bilateral flanks was performed for planning purposes. The left renal lower pole was selected as appropriate for percutaneous biopsy access. Skin entry site was marked, and the overlying skin was prepped and draped in the standard sterile fashion. Local analgesia was obtained with 1% lidocaine. Using ultrasound guidance, a 15 gauge introducer needle was advanced just deep to the cortex of the lower pole. Subsequently, core needle biopsy was performed using a 16 gauge core biopsy device x2 total passes. Specimens were submitted in saline to pathology for further handling. Gel-Foam slurry was administered through the introducer needle as it was removed to assist with hemostasis. Limited postprocedure imaging demonstrated expected post biopsy changes without hematoma. A clean dressing was placed. The patient tolerated the procedure well without immediate complication. IMPRESSION: Successful ultrasound-guided random renal core biopsy of the left renal lower pole. Electronically Signed   By: Albin Felling M.D.   On: 10/03/2021 11:43   ? ?Scheduled Meds: ? atorvastatin  80 mg Oral Daily  ? carvedilol  25 mg Oral BID WC  ? Chlorhexidine Gluconate Cloth  6 each Topical Q0600  ? darbepoetin (ARANESP) injection - DIALYSIS  60 mcg Intravenous Q Mon-HD  ? dorzolamide-timolol  1 drop Both Eyes Daily  ? fluticasone  2 spray Each Nare Daily  ? insulin aspart  0-6 Units  Subcutaneous TID WC  ? pantoprazole  40 mg Oral BID  ? tamsulosin  0.4 mg Oral Daily  ? ?Continuous Infusions: ? sodium chloride    ? sodium chloride    ? ?PRN Meds:.sodium chloride, sodium chloride, acetaminophen **OR** acetaminophen, alteplase, lidocaine (PF), lidocaine-prilocaine, pentafluoroprop-tetrafluoroeth, senna-docusate  ? ?ASSESMENT:  ? ?CGE in setting of uremia/AKI/Plavix.  Nausea, vomiting resolved.  History of nonbleeding duodenal ulcer, gastritis and positive H. pylori in 2018.  The H. pylori does not appear to have been treated.  Recent acute symptoms all resolved with bid PPI but patient needs to go back on Plavix. ? ?AKI.  Has had 2 sessions of hemodialysis so far.  BUNs/creatinine 37/6.9, GFR 8 today. ? ?Hypokalemia with K 3.3. ? ?Normocytic anemia.  Initial Hb 11.4 in setting of volume depletion, remains stable in the low 9 region, 9.2 today. ? ? ?PLAN  ? ?EGD today.  Patient agreeable.  He is n.p.o. other than some sips of water this morning. ? ? ? ?Azucena Freed  10/04/2021, 9:21 AM ?Phone [phone]  ?

## 2021-10-04 NOTE — Evaluation (Signed)
Occupational Therapy Evaluation ?Patient Details ?Name: Kevin Soto ?MRN: [id number] ?DOB: 01/15/1961 ?Today's Date: 10/04/2021 ? ? ?History of Present Illness Pt is a 61 y.o. M who presents 5/10 with renal failure requiring HD. Significant PMH: HTN, T2DM, CKD3.  ? ?Clinical Impression ?  ?Pt. Was cooperative with OT evaluation: Pt. Was able to follow directions but appears to need extra processing time. Pt. Was able to sit eob for adls without lob. Pt. Was able to amb in room with rw with slow gait. Pt. To be followed by acute ot.  ?   ? ?Recommendations for follow up therapy are one component of a multi-disciplinary discharge planning process, led by the attending physician.  Recommendations may be updated based on patient status, additional functional criteria and insurance authorization.  ? ?Follow Up Recommendations ? Home health OT  ?  ?Assistance Recommended at Discharge PRN  ?Patient can return home with the following A little help with walking and/or transfers;A little help with bathing/dressing/bathroom;Assistance with cooking/housework ? ?  ?Functional Status Assessment ? Patient has had a recent decline in their functional status and demonstrates the ability to make significant improvements in function in a reasonable and predictable amount of time.  ?Equipment Recommendations ? None recommended by OT  ?  ?Recommendations for Other Services   ? ? ?  ?Precautions / Restrictions Precautions ?Precautions: Fall ?Precaution Comments: pt. reports one fall in the past year. ?Restrictions ?Weight Bearing Restrictions: No  ? ?  ? ?Mobility Bed Mobility ?  ?  ?  ?  ?  ?  ?  ?  ?  ? ?Transfers ?Overall transfer level: Needs assistance ?Equipment used: Rolling walker (2 wheels) ?Transfers: Sit to/from Stand ?Sit to Stand: Min guard ?  ?  ?  ?  ?  ?  ?  ? ?  ?Balance   ?  ?Sitting balance-Leahy Scale: Good ?  ?  ?  ?Standing balance-Leahy Scale: Fair ?  ?  ?  ?  ?  ?  ?  ?  ?  ?  ?  ?  ?   ? ?ADL either performed  or assessed with clinical judgement  ? ?ADL Overall ADL's : Needs assistance/impaired ?Eating/Feeding: Independent ?  ?Grooming: Wash/dry hands;Wash/dry face;Supervision/safety;Standing ?  ?Upper Body Bathing: Supervision/ safety;Set up;Sitting ?  ?Lower Body Bathing: Min guard;Sit to/from stand ?  ?Upper Body Dressing : Minimal assistance;Sitting ?  ?Lower Body Dressing: Minimal assistance;Sit to/from stand ?  ?Toilet Transfer: Min guard;Grab bars;Rolling walker (2 wheels) ?  ?Toileting- Water quality scientist and Hygiene: Min guard;Sit to/from stand ?  ?  ?  ?Functional mobility during ADLs: Min guard;Rolling walker (2 wheels) ?General ADL Comments: able to sti eob for adls without lob  ? ? ? ?Vision Baseline Vision/History: 1 Wears glasses ?Ability to See in Adequate Light: 0 Adequate ?Patient Visual Report: No change from baseline ?Vision Assessment?: No apparent visual deficits  ?   ?Perception   ?  ?Praxis   ?  ? ?Pertinent Vitals/Pain Pain Assessment ?Pain Assessment: No/denies pain  ? ? ? ?Hand Dominance Right ?  ?Extremity/Trunk Assessment Upper Extremity Assessment ?Upper Extremity Assessment: Defer to OT evaluation ?  ?Lower Extremity Assessment ?Lower Extremity Assessment: Generalized weakness ?  ?  ?  ?Communication Communication ?Communication: No difficulties ?  ?Cognition Arousal/Alertness: Awake/alert ?Behavior During Therapy: [facility] for tasks assessed/performed ?Overall Cognitive Status: Within Functional Limits for tasks assessed ?  ?  ?  ?  ?  ?  ?  ?  ?  ?  ?  ?  ?  ?  ?  ?  ?  General Comments: Pt. appears to need extra time to follow diretions. ?  ?  ?General Comments    ? ?  ?Exercises   ?  ?Shoulder Instructions    ? ? ?Home Living Family/patient expects to be discharged to:: Private residence ?Living Arrangements: Other relatives (sister) ?Available Help at Discharge: Family ?Type of Home: House ?Home Access: Stairs to enter ?Entrance Stairs-Number of Steps: 4 ?Entrance Stairs-Rails: Right ?Home  Layout: One level ?  ?  ?Bathroom Shower/Tub: Tub/shower unit ?  ?Bathroom Toilet: Standard ?  ?  ?Home Equipment: Conservation officer, nature (2 wheels);Cane - single point;Shower seat ?  ?  ?  ? ?  ?Prior Functioning/Environment Prior Level of Function : Needs assist ?  ?  ?  ?  ?  ?  ?Mobility Comments: using walker intermittently ?ADLs Comments: pt independent ADL's, sister does cooking/cleaning/laundry, pt does not drive ?  ? ?  ?  ?OT Problem List: Decreased activity tolerance;Impaired balance (sitting and/or standing);Decreased safety awareness;Decreased knowledge of use of DME or AE ?  ?   ?OT Treatment/Interventions: Self-care/ADL training;DME and/or AE instruction;Therapeutic activities;Patient/family education  ?  ?OT Goals(Current goals can be found in the care plan section) Acute Rehab OT Goals ?Patient Stated Goal: to go home ?OT Goal Formulation: With patient ?Time For Goal Achievement: 10/18/21 ?Potential to Achieve Goals: Good  ?OT Frequency: Min 2X/week ?  ? ?Co-evaluation   ?  ?  ?  ?  ? ?  ?AM-PAC OT "6 Clicks" Daily Activity     ?Outcome Measure Help from another person eating meals?: None ?Help from another person taking care of personal grooming?: A Little ?Help from another person toileting, which includes using toliet, bedpan, or urinal?: A Little ?Help from another person bathing (including washing, rinsing, drying)?: A Little ?Help from another person to put on and taking off regular upper body clothing?: A Little ?Help from another person to put on and taking off regular lower body clothing?: A Little ?6 Click Score: 19 ?  ?End of Session Equipment Utilized During Treatment: Rolling walker (2 wheels) ?Nurse Communication:  (ok therapy) ? ?Activity Tolerance: Patient tolerated treatment well ?Patient left: in chair;with call bell/phone within reach;with chair alarm set;with nursing/sitter in room ? ?OT Visit Diagnosis: Unsteadiness on feet (R26.81)  ?              ?Time: 1962-2297 ?OT Time Calculation  (min): 33 min ?Charges:  OT General Charges ?$OT Visit: 1 Visit ?OT Evaluation ?$OT Eval Moderate Complexity: 1 Mod ?Reece Packer OT/L ? ? ? ?Kyonna Frier ?10/04/2021, 5:23 PM ?

## 2021-10-05 ENCOUNTER — Other Ambulatory Visit (HOSPITAL_COMMUNITY): Payer: Self-pay

## 2021-10-05 ENCOUNTER — Encounter (HOSPITAL_COMMUNITY): Payer: Self-pay

## 2021-10-05 LAB — RENAL FUNCTION PANEL
Albumin: 2.5 g/dL — ABNORMAL LOW (ref 3.5–5.0)
Anion gap: 8 (ref 5–15)
BUN: 39 mg/dL — ABNORMAL HIGH (ref 8–23)
CO2: 23 mmol/L (ref 22–32)
Calcium: 8.2 mg/dL — ABNORMAL LOW (ref 8.9–10.3)
Chloride: 107 mmol/L (ref 98–111)
Creatinine, Ser: 6.21 mg/dL — ABNORMAL HIGH (ref 0.61–1.24)
GFR, Estimated: 9 mL/min — ABNORMAL LOW (ref >60)
Glucose, Bld: 126 mg/dL — ABNORMAL HIGH (ref 70–99)
Phosphorus: 2.7 mg/dL (ref 2.5–4.6)
Potassium: 3.2 mmol/L — ABNORMAL LOW (ref 3.5–5.1)
Sodium: 138 mmol/L (ref 135–145)

## 2021-10-05 LAB — CBC
HCT: 25.7 % — ABNORMAL LOW (ref 39.0–52.0)
Hemoglobin: 8.5 g/dL — ABNORMAL LOW (ref 13.0–17.0)
MCH: 27.8 pg (ref 26.0–34.0)
MCHC: 33.1 g/dL (ref 30.0–36.0)
MCV: 84 fL (ref 80.0–100.0)
Platelets: 153 10*3/uL (ref 150–400)
RBC: 3.06 MIL/uL — ABNORMAL LOW (ref 4.22–5.81)
RDW: 14.5 % (ref 11.5–15.5)
WBC: 11.9 10*3/uL — ABNORMAL HIGH (ref 4.0–10.5)
nRBC: 0 % (ref 0.0–0.2)

## 2021-10-05 LAB — GLUCOSE, CAPILLARY
Glucose-Capillary: 126 mg/dL — ABNORMAL HIGH (ref 70–99)
Glucose-Capillary: 106 mg/dL — ABNORMAL HIGH (ref 70–99)

## 2021-10-05 LAB — CK: Total CK: 97 U/L (ref 49–397)

## 2021-10-05 MED ORDER — SENNOSIDES-DOCUSATE SODIUM 8.6-50 MG PO TABS
1.0000 | ORAL_TABLET | Freq: Every evening | ORAL | 0 refills | Status: DC | PRN
  Filled 2021-10-05: qty 30, 30d supply, fill #0

## 2021-10-05 MED ORDER — POTASSIUM CHLORIDE CRYS ER 20 MEQ PO TBCR: 40.0000 meq | EXTENDED_RELEASE_TABLET | Freq: Two times a day (BID) | ORAL | Status: DC

## 2021-10-05 MED ORDER — CARVEDILOL 25 MG PO TABS
25.0000 mg | ORAL_TABLET | Freq: Two times a day (BID) | ORAL | 0 refills | Status: DC
  Filled 2021-10-05: qty 60, 30d supply, fill #0

## 2021-10-05 MED ORDER — ACETAMINOPHEN 325 MG PO TABS
650.0000 mg | ORAL_TABLET | Freq: Four times a day (QID) | ORAL | 0 refills | Status: DC | PRN
  Filled 2021-10-05: qty 30, 4d supply, fill #0

## 2021-10-05 MED ORDER — PANTOPRAZOLE SODIUM 40 MG PO TBEC
40.0000 mg | DELAYED_RELEASE_TABLET | Freq: Two times a day (BID) | ORAL | 0 refills | Status: DC
  Filled 2021-10-05: qty 60, 30d supply, fill #0

## 2021-10-05 NOTE — Progress Notes (Signed)
[facility] NEPHROLOGY PROGRESS NOTE  Assessment/ Plan: Pt is a 61 y.o. yo male with history of HTN, CAD, DM, stroke, ischemic cardiomyopathy, CKD 3b admitted with worsening renal failure.  #Acute kidney injury on CKD stage IIIb presumably ischemic ATN in the setting of ACE inhibitor, diuretics.  It seems like his baseline creatinine level is around 1.6-2s, admitted with a creatinine level of 13 on admission.  UA with minimal protein, no RBCs, UPC 1.38 g.  Serologies unremarkable including ANA, ANCA, C3, C4, kappa lambda ratio.  Hepatitis B, C and HIV negative. CT abdomen pelvis without hydro. Status post right IJ TDC placed on 5/12 and first HD on 5/13 and second HD on 5/15. He had a kidney biopsy done on 5/16.  The biopsy showed moderate diabetic changes and chronicity, 30 to 40% interstitial fibrosis and tubular atrophy.  Low chance of renal recovery discussed with the patient.  He is getting dialysis TTS for AKI.  Tolerating dialysis well today.  Outpatient HD arranged at [facility]) on TTS.  He can restart outpatient HD this Saturday.  Ok to discharge from renal perspective.  #Proteinuria probably because of diabetes.  Serologies unremarkable.  #Hypokalemia: Dialysis with high potassium bath.  #Hypertension: Holding diuretics and lisinopril.  #Metabolic acidosis now managing with dialysis.  #Anemia, due to chronic disease/CKD: Iron repleted.  Started ESA.  Subjective: Seen and examined at dialysis unit.  He denies nausea, vomiting, chest pain, shortness of breath.  Informed the biopsy result to the patient.  Noted the plan is to discharge him today.  Objective Vital signs in last 24 hours: Vitals:   10/05/21 0930 10/05/21 1000 10/05/21 1030 10/05/21 1100  BP: 121/67 (!) 113/92 110/60   Pulse:      Resp: '14 17 15 '$ (!) 25  Temp:      TempSrc:      SpO2:      Weight:      Height:       Weight change:   Intake/Output Summary (Last 24 hours) at 10/05/2021  1141 Last data filed at 10/05/2021 7048 Gross per 24 hour  Intake --  Output 625 ml  Net -625 ml        Labs: Basic Metabolic Panel: Recent Labs  Lab 09/30/21 1027 10/01/21 0216 10/03/21 0118 10/04/21 0545 10/05/21 0132  NA 139   < > 139 140 138  K 3.4*   < > 3.0* 3.3* 3.2*  CL 106   < > 105 104 107  CO2 19*   < > '25 26 23  '$ GLUCOSE 129*   < > 165* 124* 126*  BUN 100*   < > 36* 37* 39*  CREATININE 14.37*   < > 6.63* 6.90* 6.21*  CALCIUM 6.9*   < > 7.7* 8.2* 8.2*  PHOS 5.2*  --   --  2.9 2.7   < > = values in this interval not displayed.    Liver Function Tests: Recent Labs  Lab 09/30/21 1027 10/04/21 0545 10/05/21 0132  ALBUMIN 2.5* 2.5* 2.5*    No results for input(s): LIPASE, AMYLASE in the last 168 hours. No results for input(s): AMMONIA in the last 168 hours. CBC: Recent Labs  Lab 09/28/21 1918 09/29/21 0236 10/01/21 0216 10/02/21 0744 10/03/21 0118 10/04/21 0545 10/05/21 0132  WBC 16.3*   < > 8.7 10.5 9.3 11.5* 11.9*  NEUTROABS 13.2*  --   --   --   --   --   --   HGB  10.4*   < > 9.1* 9.1* 9.4* 9.2* 8.5*  HCT 30.9*   < > 25.9* 26.8* 27.5* 26.8* 25.7*  MCV 85.1   < > 79.7* 82.5 82.1 82.0 84.0  PLT 269   < > 177 172 148* 155 153   < > = values in this interval not displayed.    Cardiac Enzymes: Recent Labs  Lab 09/28/21 1520 10/05/21 0132  CKTOTAL 155 97    CBG: Recent Labs  Lab 10/04/21 1056 10/04/21 1151 10/04/21 1612 10/04/21 1951 10/05/21 0714  GLUCAP 162* 168* 143* 140* 126*     Iron Studies: No results for input(s): IRON, TIBC, TRANSFERRIN, FERRITIN in the last 72 hours. Studies/Results: No results found.  Medications: Infusions:  sodium chloride     sodium chloride      Scheduled Medications:  carvedilol  25 mg Oral BID WC   Chlorhexidine Gluconate Cloth  6 each Topical Q0600   Chlorhexidine Gluconate Cloth  6 each Topical Q0600   clopidogrel  75 mg Oral Daily   darbepoetin (ARANESP) injection - DIALYSIS  60 mcg  Intravenous Q Mon-HD   dorzolamide-timolol  1 drop Both Eyes Daily   fluticasone  2 spray Each Nare Daily   heparin injection (subcutaneous)  5,000 Units Subcutaneous Q8H   insulin aspart  0-6 Units Subcutaneous TID WC   pantoprazole  40 mg Oral BID   potassium chloride  40 mEq Oral BID   tamsulosin  0.4 mg Oral Daily    have reviewed scheduled and prn medications.  Physical Exam: General: Not in distress looks comfortable. Heart:RRR, s1s2 nl Lungs: Clear b/l, no crackle Abdomen:soft, Non-tender, non-distended Extremities: No peripheral edema. Dialysis Access: Right IJ TDC in place.  Zainab Crumrine Prasad Marthella Osorno 10/05/2021,11:41 AM  LOS: 7 days

## 2021-10-05 NOTE — Progress Notes (Addendum)
Pt has been accepted at [facility]) on TTS. Pt can start on Saturday if pt completes paperwork at clinic on Friday by 3:00 pm. Pt will need to arrive on Saturday at 10:35 for 10:55 chair time.  Discussed above details with pt's sister via phone and will discuss with pt later today once he returns to room from HD. Will text details to pt's sister as well per her request. Appts added to AVS. Update provided to attending, nephrologist, RN, and [facility] staff via secure chat. Will assist as needed.  Melven Sartorius Renal Navigator [phone]  Addendum at 2:16 pm: Pt to d/c to home today. Met with pt at bedside to discuss out-pt HD clinic and schedule. Pt agreeable to plan. Schedule letter provided to pt as well. Contacted [facility] to make clinic aware of pt's d/c today and that pt should be at clinic tomorrow to complete paperwork to start on Saturday. Contacted renal PA regarding clinic's need for orders.

## 2021-10-05 NOTE — Discharge Instructions (Signed)
Kevin Soto you were admitted to [facility] Internal Medicine Service because of kidney failure that required you to start hemodialysis. You are improved significantly with this treatment, and we continued to monitor you until you were stable enough for discharge. You will need to continue getting dialysis three times a week. Please do not miss any sessions.   PLEASE take your medications as listed on your discharge papers. There are some medications which you need to discontinue.   PLEASE do not miss any doses of your medications as it is very important in ensuring you continue to feel better and remain stable.   You need to follow up with your primary care doctor at the [facility] to determine the best management plan for you and your conditions, to prevent you from having to return to the hospital. ADDITIONALLY, please go to any other appointments made for you in this discharge information.  RETURN to the ED if you have similar or worsening symptoms, and do not feel like your normal self.

## 2021-10-05 NOTE — Progress Notes (Signed)
Mobility Specialist Progress Note:   10/05/21 1322  Mobility  Activity Stood at bedside;Ambulated with assistance in room  Level of Assistance Contact guard assist, steadying assist  Assistive Device  (HHA)  Distance Ambulated (ft) 2 ft  Activity Response Tolerated well  $Mobility charge 1 Mobility   Pt received with bed alarm going off trying to turn lights on. Assisted pt back into bed and turned on lights. Pt declining hallway ambulation right now but said he will this afternoon. Left in bed with call bell in reach and all needs met.   [facility] Public librarian Phone [phone]

## 2021-10-05 NOTE — Procedures (Signed)
Pt arrived to hemodialysis with a BP of 113/92. Tolerated hemodialysis well with no complaints noted.  Vital signs remained stable throughout his treatment.  No meds were given.  Permcath flushes and draws easily.  Pt returned to room in stable condition.  Final BP was  with 0 UF removed.

## 2021-10-05 NOTE — Progress Notes (Signed)
Patient transfer to HD via bed by transportation service.

## 2021-10-05 NOTE — Progress Notes (Signed)
Patient discharged to home with sister via wheelchair with all belongings.

## 2021-10-05 NOTE — TOC Initial Note (Signed)
Transition of Care [facility]) - Initial/Assessment Note    Patient Details  Name: Kevin Soto MRN: [id number] Date of Birth: [birth year]  [facility] CM/SW Contact:    Marilu Favre, RN Phone Number: 10/05/2021, 12:59 PM  Clinical Narrative:                 Spoke to patient at bedside. Confirmed face sheet information.   Discussed home health PT/OT patient in agreement. [place] home health accepted referral.   No DME recommendations.   PCP is Sadia Aslam   Sister will assist with transportation   Expected Discharge Plan: [place] Barriers to Discharge: Continued Medical Work up   Patient Goals and CMS Choice Patient states their goals for this hospitalization and ongoing recovery are:: to return to home CMS Medicare.gov Compare Post Acute Care list provided to:: Patient Choice offered to / list presented to : Patient  Expected Discharge Plan and Services Expected Discharge Plan: [place]   Discharge Planning Services: CM Consult Post Acute Care Choice: [place] arrangements for the past 2 months: Single Family Home                 DME Arranged: N/A         HH Arranged: PT, OT HH Agency: [place] Date [facility] Agency Contacted: 10/05/21 Time [place]: [id number] Representative spoke with at [place]: Tommi Rumps  Prior Living Arrangements/Services Living arrangements for the past 2 months: [place]   Patient language and need for interpreter reviewed:: Yes Do you feel safe going back to the place where you live?: Yes      Need for Family Participation in Patient Care: Yes (Comment) Care giver support system in place?: Yes (comment)   Criminal Activity/Legal Involvement Pertinent to Current Situation/Hospitalization: No - Comment as needed  Activities of Daily Living      Permission Sought/Granted   Permission granted to share information with : No               Emotional Assessment Appearance:: Appears stated age Attitude/Demeanor/Rapport: Engaged Affect (typically observed): Accepting Orientation: : Oriented to Situation, Oriented to  Time, Oriented to Place, Oriented to Self Alcohol / Substance Use: Not Applicable Psych Involvement: No (comment)  Admission diagnosis:  Uremia [N19] Acute renal failure, unspecified acute renal failure type ([place]) [N17.9] Patient Active Problem List   Diagnosis Date Noted   Duodenitis    Gastroesophageal reflux disease with esophagitis without hemorrhage    Hiatal hernia    Acute renal failure ([place])    Uremia 09/27/2021   Type 2 diabetes mellitus with complication, without long-term current use of insulin ([place]) 07/21/2020   Dyslipidemia 07/21/2020   Memory change 05/07/2018   Chronic nasal congestion 11/12/2017   Malignant neoplasm of prostate ([place]) 03/11/2017   CAD (coronary artery disease)    Erectile dysfunction 05/08/2016   Advanced care planning/counseling discussion 01/31/2016   CKD stage 3 due to type 2 diabetes mellitus ([place]) 08/18/2014   Normocytic anemia 06/24/2012   Preventative health care 06/24/2012   Unsteady gait when walking 02/27/2011   Hyperlipidemia due to type 2 diabetes mellitus ([place]) 02/16/2011   Atherosclerotic heart disease of native coronary artery with unspecified angina pectoris ([place]) 01/26/2011   Diabetes mellitus type 2 with neurological manifestations ([place]) 12/15/2010   Dilated cardiomyopathy ([place])    Hypertension associated with diabetes ([place])    CVA (cerebral vascular accident) ([place]) 11/28/2010  PCP:  Harvie Heck, MD Pharmacy:   [facility] ([place], [place] [facility] [address] [place] [address] [place] [address] Phone: [phone] Fax: [phone]  [facility] ([facility] ) - [place], [place] [address] Phone: [phone] Fax:  [phone]     Social Determinants of Health (SDOH) Interventions    Readmission Risk Interventions     View : No data to display.

## 2021-10-05 NOTE — Progress Notes (Signed)
Sister [name] called and notified about patient discharge. She will be the one picking up patient. Discharge instructions given. Patient currently in room waiting for sister to pick him up.

## 2021-10-05 NOTE — Discharge Summary (Signed)
 Name: Kevin Soto MRN: [id number] DOB: 10/18/1960 61 y.o. PCP: Aslam, Sadia, MD  Date of Admission: 09/27/2021  1:05 PM Date of Discharge:  10/05/21 Attending Physician: Dr. Machen  DISCHARGE DIAGNOSIS:  Primary Problem: Uremia 2/2 AKI on CKD3b progressing to ESRD on HD  Hospital Problems: Principal Problem:   Uremia Active Problems:   Acute renal failure (HCC)   Duodenitis   Gastroesophageal reflux disease with esophagitis without hemorrhage   Hiatal hernia    DISCHARGE MEDICATIONS:   Allergies as of 10/05/2021       Reactions   Clonidine Derivatives    Dizzy, too sleepy        Medication List     STOP taking these medications    atorvastatin 80 MG tablet Commonly known as: LIPITOR   hydrochlorothiazide 25 MG tablet Commonly known as: HYDRODIURIL   lisinopril 20 MG tablet Commonly known as: ZESTRIL   lisinopril 30 MG tablet Commonly known as: ZESTRIL   rosuvastatin 40 MG tablet Commonly known as: CRESTOR   Synjardy 09-998 MG Tabs Generic drug: Empagliflozin-metFORMIN HCl       TAKE these medications    Accu-Chek FastClix Lancets Misc Use to check blood sugar up to 7 times a week   OneTouch Delica Plus Lancet33G Misc Check blood sugar 1 time a day   acetaminophen 325 MG tablet Commonly known as: TYLENOL Take 2 tablets (650 mg total) by mouth every 6 (six) hours as needed for mild pain (or Fever >/= 101).   aspirin EC 81 MG tablet Take 1 tablet (81 mg total) by mouth daily.   carvedilol 25 MG tablet Commonly known as: COREG Take 1 tablet (25 mg total) by mouth 2 (two) times daily with a meal.   clopidogrel 75 MG tablet Commonly known as: PLAVIX Take 1 tablet (75 mg total) by mouth daily.   dorzolamide-timolol 22.3-6.8 MG/ML ophthalmic solution Commonly known as: COSOPT Place 1 drop into both eyes daily.   fluticasone 50 MCG/ACT nasal spray Commonly known as: Flonase Place 2 sprays into both nostrils daily.   nitroGLYCERIN  0.4 MG SL tablet Commonly known as: NITROSTAT Place 1 tablet (0.4 mg total) under the tongue every 5 (five) minutes as needed.   OneTouch Verio Reflect w/Device Kit Check blood sugar 1 time a day   OneTouch Verio test strip Generic drug: glucose blood Check blood sugar 1 time a day   pantoprazole 40 MG tablet Commonly known as: PROTONIX Take 1 tablet (40 mg total) by mouth 2 (two) times daily.   senna-docusate 8.6-50 MG tablet Commonly known as: Senokot-S Take 1 tablet by mouth at bedtime as needed for mild constipation.   tamsulosin 0.4 MG Caps capsule Commonly known as: FLOMAX Take 1 capsule (0.4 mg total) by mouth daily.   Trulicity 1.5 MG/0.5ML Sopn Generic drug: Dulaglutide INJECT SUBCUTANEOUSLY 1.5  MG INTO THE SKIN WEEKLY What changed:  how much to take how to take this when to take this additional instructions   Vitamin D (Ergocalciferol) 1.25 MG (50000 UNIT) Caps capsule Commonly known as: DRISDOL Take 50,000 Units by mouth every 7 (seven) days.               Discharge Care Instructions  (From admission, onward)           Start     Ordered   10/05/21 0000  Discharge wound care:       Comments: Daily dressing changes as needed   10/05/21 1336              DISPOSITION AND FOLLOW-UP:  Kevin Soto was discharged from [facility] in stable condition. At the hospital follow up visit please address:  Follow-up Recommendations: Consults: none  Labs: BMP, CBC Studies: none  Medications: STOPPED HCTZ, Lisinopril, Atorvastatin, and Synjardy at time of discharge. Please consider starting Torsemide at time of follow up depending on ongoing urine production. Continue Protonix 40 mg PO BID x 6 weeks and then 40 mg daily.   Follow-up Appointments:  [facility]. Go on 10/06/2021.   Why: Go to clinic on Friday by 3:00 to complete paperwork.  Schedule is Tueday,Thursday, Saturday with  10:55 chair time.  Patient can start on Saturday if paperwork is completed on Friday.  On Saturday, arrive at 10:35 for 10:55 chair time. Contact information: [place] [address] [place], [facility] Follow up.   Specialty: Home Health Services Contact information: [place] [address] [phone]         Harvie Heck, MD. Go in 7 day(s).   Specialty: Internal Medicine Why: Please go to your appointment on 10/12/21 at the internal medicine center at 10:15 am for a post hospital visit. Please do not forget! Contact information: [address] [place] [address] [phone]                 HOSPITAL COURSE:  Patient Summary: AKI on CKD3b, now ESRD on TTS HD Proteinuria  Uremia, improved AGMA, resolved  Patient with a history of CKD, diabetes and hypertension found to have significant increase in BUN/sCr to 84/13 from a baseline of around 30/1.7-1.8.  Patient reported nausea and vomiting as well as mild atelectasis on exam but mental status stable, alert and oriented x4. Patient significantly dry on exam. CT abdomen pelvis negative for hydronephrosis. Patient very dehydrated on exam and is was on multiple nephrotoxic medications such as HCTZ, Jardiance, lisinopril and metformin. UA shows proteinuria likely secondary to diabetic nephropathy. Patient at high risk of progressing to ESRD. Started IV hydration and monitored for improvement in kidney function. Unfortunately, no improvement/ worsening renal function despite 2 days of aggressive volume resuscitation. Patient's acute on chronic renal failure/uremia likely a combination of prerenal and intrinsic insults given poor PO intake, dry on exam during presentation and was on multiple nephrotoxic medications. Given non-improving renal failure, HD cath placed and HD initiated. Renal biopsy done on 10/03/21.  Much improvement in renal function and  clinical status with HD. Workup for rapidly progressive kidney disease reassuring, and biopsy showing showing chronic diabetic changes, moderate chronicity.  Low chance of renal recovery discussed with the patient by nephrology team; he reports understanding. He is sent home after HD, and is scheduled for TTS outpatient HD. --Discontinued HCTZ, lisinopril, Jardiance and metformin at time of discharge  --Could benefit from Torsemide during hospital follow up given pt continues to produce urine.   Hypokalemia  K fluctuating at 3-3.5. Repleted as needed.    Coffee ground emesis, resolved  Hx of normocytic anemia Isolated episode of coffee ground emesis the day after admission. GI was consulted, recommending EGD once pt was stable from renal standpoint. Hgb stable at 9, but baseline is around 10-11; suspect CKD contributing. Iron studies were reassuring. EGD this admission was reassuring. Coffee-ground emesis was likely from uremia associated platelet dysfunction.  --Continue Protonix 40 mg PO BID x6 weeks, then reduce to 40 mg  daily lowest effective dose to control reflux symptoms.  HTN HLD CAD Ischemic cardiomyopathy Patient with a history of uncontrolled blood pressure on multiple antihypertensive regimen. BP with systolic in the 110s to 120s on admission. Recent lipid panel showed LDL of 69. Has a history of macular degeneration leading to blurry eyes but denies any chest pain, headaches, palpitations or shortness of breath. No clinical signs of heart failure. Euvolemic on exam. --Continued Coreg 25 mg BID --Held lisinopril and HCTZ and discontinued at time of discharge  --Continued home ASA 81 mg and Plavix 75 mg daily --Home Lipitor 80 mg daily discontinued per nephrology    T2DM Relatively well controlled. Last A1c 7.2% ~3 months ago.  CBG of 71 today.  --Hold metformin and Jardiance as above --SSI with meal, with CBG checks --Follow-up repeat A1c    DISCHARGE INSTRUCTIONS:    Discharge Instructions     Call MD for:  difficulty breathing, headache or visual disturbances   Complete by: As directed    Call MD for:  extreme fatigue   Complete by: As directed    Call MD for:  hives   Complete by: As directed    Call MD for:  persistant dizziness or light-headedness   Complete by: As directed    Call MD for:  persistant nausea and vomiting   Complete by: As directed    Call MD for:  redness, tenderness, or signs of infection (pain, swelling, redness, odor or green/yellow discharge around incision site)   Complete by: As directed    Call MD for:  severe uncontrolled pain   Complete by: As directed    Call MD for:  temperature >100.4   Complete by: As directed    Diet - low sodium heart healthy   Complete by: As directed    Discharge wound care:   Complete by: As directed    Daily dressing changes as needed   Increase activity slowly   Complete by: As directed        SUBJECTIVE:  No acute overnight events.    Patient was seen at bedside during his HD session during rounds today. Resting comfortably in bed, feels well, and has no complaints. Denies confusion, SHOB, chest pain, N/V, and abdominal pain. He appears much improved compared to admission.    Pt is updated on the plan for today, and all questions and concerns are addressed.   Discharge Vitals:   BP 138/71 (BP Location: Right Arm)   Pulse 77   Temp 98 F (36.7 C) (Oral)   Resp 17   Ht 6' (1.829 m)   Wt 89.8 kg   SpO2 100%   BMI 26.85 kg/m   OBJECTIVE:  General: Pleasant elderly gentleman laying in bed. No acute distress. CV: RRR. No murmurs, rubs, or gallops. No LE edema Pulmonary: Lungs CTAB. Normal effort. No wheezing or rales. Abdominal: Soft, nontender, nondistended. Normal bowel sounds. Skin: Warm and dry  Neuro: A&Ox3. Appropriate responses.    Pertinent Labs, Studies, and Procedures:     Latest Ref Rng & Units 10/05/2021    1:32 AM 10/04/2021    5:45 AM 10/03/2021     1:18 AM  CBC  WBC 4.0 - 10.5 K/uL 11.9   11.5   9.3    Hemoglobin 13.0 - 17.0 g/dL 8.5   9.2   9.4    Hematocrit 39.0 - 52.0 % 25.7   26.8   27.5    Platelets 150 - 400 K/uL 153     155   148         Latest Ref Rng & Units 10/05/2021    1:32 AM 10/04/2021    5:45 AM 10/03/2021    1:18 AM  CMP  Glucose 70 - 99 mg/dL 126   124   165    BUN 8 - 23 mg/dL 39   37   36    Creatinine 0.61 - 1.24 mg/dL 6.21   6.90   6.63    Sodium 135 - 145 mmol/L 138   140   139    Potassium 3.5 - 5.1 mmol/L 3.2   3.3   3.0    Chloride 98 - 111 mmol/L 107   104   105    CO2 22 - 32 mmol/L 23   26   25    Calcium 8.9 - 10.3 mg/dL 8.2   8.2   7.7      CT ABDOMEN PELVIS WO CONTRAST  Result Date: 09/27/2021 CLINICAL DATA:  Kidney failure, acute EXAM: CT ABDOMEN AND PELVIS WITHOUT CONTRAST TECHNIQUE: Multidetector CT imaging of the abdomen and pelvis was performed following the standard protocol without IV contrast. RADIATION DOSE REDUCTION: This exam was performed according to the departmental dose-optimization program which includes automated exposure control, adjustment of the mA and/or kV according to patient size and/or use of iterative reconstruction technique. COMPARISON:  10/30/2016 FINDINGS: Lower chest: The visualized lung bases are clear. Extensive multi-vessel coronary artery calcification. Global cardiac size within normal limits. No pericardial effusion. Hepatobiliary: No focal liver abnormality is seen. No gallstones, gallbladder wall thickening, or biliary dilatation. Pancreas: Unremarkable Spleen: Unremarkable Adrenals/Urinary Tract: The adrenal glands are unremarkable. The kidneys are normal in size and position. Mild bilateral nonspecific perinephric stranding is stable since prior examination. No perinephric fluid collections. No hydronephrosis. No intrarenal or ureteral calculi. Foley catheter balloon is seen within a decompressed bladder lumen. Stomach/Bowel: Stomach is within normal limits. Appendix  appears normal. No evidence of bowel wall thickening, distention, or inflammatory changes. Vascular/Lymphatic: Aortic atherosclerosis. No enlarged abdominal or pelvic lymph nodes. Reproductive: Brachytherapy seeds are seen within the prostate gland. Other: Small bilateral fat containing inguinal hernias and small fat containing umbilical hernia are present. Musculoskeletal: Degenerative changes seen within the lumbar spine. No acute bone abnormality. No lytic or blastic bone lesion. IMPRESSION: Stable nonspecific perinephric stranding, likely the sequela of remote inflammation. Otherwise unremarkable noncontrast examination of the kidneys and upper collecting system. Foley catheter decompression of the bladder lumen noted. Extensive multi-vessel coronary artery calcification. Aortic Atherosclerosis (ICD10-I70.0). Electronically Signed   By: Ashesh  Parikh M.D.   On: 09/27/2021 20:00   DG CHEST PORT 1 VIEW  Result Date: 09/27/2021 CLINICAL DATA:  Leukocytosis and renal failure. EXAM: PORTABLE CHEST 1 VIEW COMPARISON:  January 19, 2021 FINDINGS: Multiple overlying radiopaque cardiac lead wires are seen. The heart size and mediastinal contours are within normal limits. Both lungs are clear. The visualized skeletal structures are unremarkable. IMPRESSION: No active disease. Electronically Signed   By: Thaddeus  Houston M.D.   On: 09/27/2021 23:10   US THYROID  Result Date: 09/28/2021 CLINICAL DATA:  Palpable abnormality.  Thyromegaly EXAM: THYROID ULTRASOUND TECHNIQUE: Ultrasound examination of the thyroid gland and adjacent soft tissues was performed. COMPARISON:  None Available. FINDINGS: Parenchymal Echotexture: Mildly heterogenous Isthmus: 0.3 cm Right lobe: 3.6 x 1.8 x 1.9 cm Left lobe: 3.9 x 1.4 x 1.7 cm _________________________________________________________ Estimated total number of nodules >/= 1 cm: 0 Number of spongiform nodules >/=  2   cm not described below (TR1): 0 Number of mixed cystic and solid  nodules >/= 1.5 cm not described below (TR2): 0 _________________________________________________________ No discrete nodules of consequence are seen within the thyroid gland. Incidental note is made of a small subcentimeter hypoechoic nodule in the left inferior gland. This nodule does not meet size criteria to warrant further follow-up. IMPRESSION: Mildly heterogeneous but normally sized thyroid gland. No thyroid nodules identified that would warrant further evaluation. The above is in keeping with the ACR TI-RADS recommendations - J Am Coll Radiol 2017;14:587-595. Electronically Signed   By: Jacqulynn Cadet M.D.   On: 09/28/2021 15:26      Lajean Manes, MD Internal Medicine Resident, PGY-1 Pager: [phone]

## 2021-10-06 ENCOUNTER — Telehealth: Payer: Self-pay

## 2021-10-06 ENCOUNTER — Telehealth: Payer: Self-pay | Admitting: Nephrology

## 2021-10-06 LAB — SURGICAL PATHOLOGY

## 2021-10-06 NOTE — Telephone Encounter (Signed)
Transition Care Management Unsuccessful Follow-up Telephone Call  Date of discharge and from where:  10/06/21 [facility]  Attempts:  1st Attempt  Reason for unsuccessful TCM follow-up call:  No answer/busy   Johnney Killian, RN, BSN, [facility] Phone: [phone]: [phone]

## 2021-10-06 NOTE — Telephone Encounter (Signed)
Transition of Care Contact from [place]   Date of Discharge: 10/05/21 Date of Contact: 10/06/21 Method of contact: phone Talked to patient   Patient contacted to discuss transition of care form recent hospitaliztion. Patient was admitted to [facility] from 09/27/21 to 10/05/21 with the discharge diagnosis of dialysis dependent AKI on CKD3.    Medication changes were reviewed - sister [name] assists with medications and doctor visits.  Patient will follow up with is outpatient dialysis center 10/07/21.  Other follow up needs include none identified.   Jen Mow, PA-C [employer]

## 2021-10-06 NOTE — Telephone Encounter (Signed)
Transition Care Management Follow-up Telephone Call Date of discharge and from where: Kevin Soto 09/27/21-10/05/21 How have you been since you were released from the hospital? "So far so good" Any questions or concerns? No  Items Reviewed: Did the pt receive and understand the discharge instructions provided? Yes  Medications obtained and verified? Yes  Other? No  Any new allergies since your discharge? No  Dietary orders reviewed? No Do you have support at home? Yes   Home Care and Equipment/Supplies: Were home health services ordered? yes If so, what is the name of the agency? [facility]  Has the agency set up a time to come to the patient's home? no Were any new equipment or medical supplies ordered?  No What is the name of the medical supply agency? N/A Were you able to get the supplies/equipment? not applicable Do you have any questions related to the use of the equipment or supplies? No  Functional Questionnaire: (I = Independent and D = Dependent) ADLs: I  Bathing/Dressing- I  Meal Prep- I  Eating- I  Maintaining continence- I  Transferring/Ambulation- I  Managing Meds- I  Follow up appointments reviewed: Discussed with patient that he is suppose to go to the [facility] today by 3 PM to fill out paperwork to start ESRD tomorrow.  Patient provided address and phone number.  PCP Hospital f/u appt confirmed? Yes  Scheduled to see Dr. Marva Panda on 10/10/21 @ 10:15. [facility] f/u appt confirmed?  N/A   Are transportation arrangements needed? No  If their condition worsens, is the pt aware to call PCP or go to the Emergency Dept.? Yes Was the patient provided with contact information for the PCP's office or ED? Yes Was to pt encouraged to call back with questions or concerns? Yes Johnney Killian, RN, BSN, CCM Care Management Coordinator [facility] Phone: [phone]: [phone]

## 2021-10-10 ENCOUNTER — Telehealth: Payer: Self-pay | Admitting: *Deleted

## 2021-10-10 NOTE — Telephone Encounter (Signed)
Call from Valda Favia PT with [facility] - who saw pt on Sunday; requesting verbal orders to "Start PT 3 times this week ; then twice a week x 3 weeks for strengthening, balance and increase ambulation". Also stated pt is unsure about his medications since some were discontinued at discharge - requesting verbal order for "RN visit for education". VO given for each request - if not appropriate, let me know.

## 2021-10-10 NOTE — Telephone Encounter (Signed)
Agreed -

## 2021-10-12 ENCOUNTER — Encounter: Payer: Medicare Other | Admitting: Student

## 2021-10-17 ENCOUNTER — Telehealth: Payer: Self-pay | Admitting: *Deleted

## 2021-10-17 NOTE — Telephone Encounter (Signed)
Called patient regarding appointment for 5-31-.2023 @ 3:45pm tomorrow. If unable to keep this appointment patient is to call the main number at [phone] to reschedule.

## 2021-10-18 ENCOUNTER — Ambulatory Visit (INDEPENDENT_AMBULATORY_CARE_PROVIDER_SITE_OTHER): Payer: Medicare Other | Admitting: Student

## 2021-10-18 ENCOUNTER — Encounter: Payer: Self-pay | Admitting: Student

## 2021-10-18 VITALS — BP 131/81 | HR 88 | Temp 98.4°F | Ht 72.0 in | Wt 202.1 lb

## 2021-10-18 DIAGNOSIS — E118 Type 2 diabetes mellitus with unspecified complications: Secondary | ICD-10-CM

## 2021-10-18 DIAGNOSIS — E1122 Type 2 diabetes mellitus with diabetic chronic kidney disease: Secondary | ICD-10-CM | POA: Diagnosis not present

## 2021-10-18 DIAGNOSIS — E1149 Type 2 diabetes mellitus with other diabetic neurological complication: Secondary | ICD-10-CM

## 2021-10-18 DIAGNOSIS — N179 Acute kidney failure, unspecified: Secondary | ICD-10-CM | POA: Diagnosis not present

## 2021-10-18 DIAGNOSIS — D649 Anemia, unspecified: Secondary | ICD-10-CM

## 2021-10-18 DIAGNOSIS — K2100 Gastro-esophageal reflux disease with esophagitis, without bleeding: Secondary | ICD-10-CM

## 2021-10-18 DIAGNOSIS — Z794 Long term (current) use of insulin: Secondary | ICD-10-CM

## 2021-10-18 DIAGNOSIS — N19 Unspecified kidney failure: Secondary | ICD-10-CM | POA: Diagnosis not present

## 2021-10-18 DIAGNOSIS — N184 Chronic kidney disease, stage 4 (severe): Secondary | ICD-10-CM

## 2021-10-18 DIAGNOSIS — I2510 Atherosclerotic heart disease of native coronary artery without angina pectoris: Secondary | ICD-10-CM

## 2021-10-18 MED ORDER — ONETOUCH VERIO REFLECT W/DEVICE KIT: PACK | 0 refills | Status: DC

## 2021-10-18 MED ORDER — ACCU-CHEK FASTCLIX LANCETS MISC: 4 refills | Status: DC

## 2021-10-18 NOTE — Patient Instructions (Addendum)
Kidney disease  Please schedule a follow up with kidney doctor  Center, [facility].   Contact information: [address] [place] [address] [phone]  Continue going to dialysis  I will call with your lab results   I have sent in a new meter and test strips, check you blood sugar once daily   Follow up in 1 month

## 2021-10-19 LAB — BMP8+ANION GAP
Anion Gap: 15 mmol/L (ref 10.0–18.0)
BUN/Creatinine Ratio: 6 — ABNORMAL LOW (ref 10–24)
BUN: 17 mg/dL (ref 8–27)
CO2: 26 mmol/L (ref 20–29)
Calcium: 9.4 mg/dL (ref 8.6–10.2)
Chloride: 100 mmol/L (ref 96–106)
Creatinine, Ser: 2.88 mg/dL — ABNORMAL HIGH (ref 0.76–1.27)
Glucose: 193 mg/dL — ABNORMAL HIGH (ref 70–99)
Potassium: 3.8 mmol/L (ref 3.5–5.2)
Sodium: 141 mmol/L (ref 134–144)
eGFR: 24 mL/min/{1.73_m2} — ABNORMAL LOW (ref >59)

## 2021-10-19 LAB — CBC
Hematocrit: 28.2 % — ABNORMAL LOW (ref 37.5–51.0)
Hemoglobin: 9.3 g/dL — ABNORMAL LOW (ref 13.0–17.7)
MCH: 28.2 pg (ref 26.6–33.0)
MCHC: 33 g/dL (ref 31.5–35.7)
MCV: 86 fL (ref 79–97)
Platelets: 327 10*3/uL (ref 150–450)
RBC: 3.3 x10E6/uL — ABNORMAL LOW (ref 4.14–5.80)
RDW: 14.9 % (ref 11.6–15.4)
WBC: 8.7 10*3/uL (ref 3.4–10.8)

## 2021-10-23 NOTE — Assessment & Plan Note (Addendum)
Patient was really admitted for acute renal failure between 09/27/2021 to 10/05/2021 nail biopsy was confirmed showing diabetic changes.  No reversible causes for his ESRD and he was started on hemodialysis.  He is continued to receive outpatient hemodialysis through a tunneled IJ on Tuesday, Thursdays, and Saturdays.  He has been receiving outpatient dialysis since discharge.  Sister and family have been transporting him to this and have they have no issues with getting patient to dialysis.  He states he continues to make urine.  He has been off his torsemide since discharge.  He appears euvolemic without lower extremity edema, will continue to hold his torsemide at this time.  BMP in office today with BUN of 17 and creatinine improved from 6.21 on discharge to 2.88 GFR is 24 consistent with CKD 4.  Sister was unaware that he had follow-up with [facility] on 10/06/2021.  Provided contact information for him to establish with nephrology we will also need to discuss with them whether he needs to continue dialysis given he continues to make good urine output and has had much improvement in his kidney function.  Follow-up with nephrology to establish care and discuss need for vascular access if he needs to continue HD

## 2021-10-23 NOTE — Assessment & Plan Note (Signed)
Recent A1c is 7.3% during admission.   He is only taking Trulicity 1.5 mg weekly.  He was discontinued on his Jardiance and metformin due to acute renal failure at his hospitalization. He has not been checking fingerstick glucose.  His sister cannot find his meter or any test strips.  Will reorder meter and supplies for this.  Follow-up in 4 weeks patient will bring meter to his next visit.

## 2021-10-23 NOTE — Assessment & Plan Note (Addendum)
His lisinopril and statin were stopped due to acute renal failure and ESRD.  He continues on Coreg, aspirin, and Plavix.  BP remains well controlled today.

## 2021-10-23 NOTE — Assessment & Plan Note (Signed)
Had an episode of coffee ground emesis on recent admission thought to be 2/2 to uremic platelet dysfunction. No further episodes of bleeder per patient. He continue on protonix 40 mg twice daily. EDG was reassuring. Plant to switch to once daily dosing of protnoix in July after 6 six weeks. Hgb improved to 9.3 today on CBC.

## 2021-10-23 NOTE — Progress Notes (Signed)
Established Patient Office Visit  Subjective   Patient ID: Kevin Soto, male    DOB: [birth year]  Age: 61 y.o. MRN: [id number]  Chief Complaint  Patient presents with   [place] / NEEDING NEW CBG MACHINE AND STRIPS / FELL Saturday.    Kevin Soto presents today for follow-up of recent admission for acute renal failure and ESRD secondary to diabetic nephropathy.  Sisters present in office.Please refer to problem based charting for further details and assessment and plan of current problem and chronic medical conditions.    Patient Active Problem List   Diagnosis Date Noted   Duodenitis    Gastroesophageal reflux disease with esophagitis without hemorrhage    Hiatal hernia    Acute renal failure ([place])    Uremia 09/27/2021   Type 2 diabetes mellitus with complication, without long-term current use of insulin ([place]) 07/21/2020   Dyslipidemia 07/21/2020   Memory change 05/07/2018   Chronic nasal congestion 11/12/2017   Malignant neoplasm of prostate ([place]) 03/11/2017   CAD (coronary artery disease)    Erectile dysfunction 05/08/2016   Advanced care planning/counseling discussion 01/31/2016   CKD (chronic kidney disease) stage 4, GFR 15-29 ml/min ([place]) 08/18/2014   Normocytic anemia 06/24/2012   Preventative health care 06/24/2012   Unsteady gait when walking 02/27/2011   Hyperlipidemia due to type 2 diabetes mellitus ([place]) 02/16/2011   Atherosclerotic heart disease of native coronary artery with unspecified angina pectoris ([place]) 01/26/2011   Diabetes mellitus type 2 with neurological manifestations ([place]) 12/15/2010   Dilated cardiomyopathy ([place])    Hypertension associated with diabetes ([place])    CVA (cerebral vascular accident) ([place]) 11/28/2010      Review of Systems  All other systems reviewed and are negative.    Objective:     BP 131/81 (BP Location: Left Arm, Patient Position: Sitting, Cuff Size: Normal)   Pulse 88   Temp 98.4 F (36.9  C) (Oral)   Ht 6' (1.829 m)   Wt 202 lb 1.6 oz (91.7 kg)   SpO2 100%   BMI 27.41 kg/m     Physical Exam Constitutional:      General: He is not in acute distress.    Comments: Somewhat disheveled   HENT:     Head: Normocephalic and atraumatic.     Mouth/Throat:     Mouth: Mucous membranes are moist.     Pharynx: Oropharynx is clear.  Eyes:     Extraocular Movements: Extraocular movements intact.     Pupils: Pupils are equal, round, and reactive to light.  Cardiovascular:     Rate and Rhythm: Normal rate and regular rhythm.     Heart sounds: No murmur heard. Pulmonary:     Effort: Pulmonary effort is normal.     Breath sounds: No rhonchi or rales.  Abdominal:     General: Abdomen is flat. Bowel sounds are normal.     Palpations: Abdomen is soft.  Musculoskeletal:     Right lower leg: No edema.     Left lower leg: No edema.  Skin:    General: Skin is warm and dry.     Findings: No bruising.     Comments: Tunneled IJ present  on right chest wall, no signs of infection  Neurological:     General: No focal deficit present.     Mental Status: He is alert and oriented to person, place, and time. Mental status is at baseline.     Results  for orders placed or performed in visit on 10/18/21  CBC no Diff  Result Value Ref Range   WBC 8.7 3.4 - 10.8 x10E3/uL   RBC 3.30 (L) 4.14 - 5.80 x10E6/uL   Hemoglobin 9.3 (L) 13.0 - 17.7 g/dL   Hematocrit 28.2 (L) 37.5 - 51.0 %   MCV 86 79 - 97 fL   MCH 28.2 26.6 - 33.0 pg   MCHC 33.0 31.5 - 35.7 g/dL   RDW 14.9 11.6 - 15.4 %   Platelets 327 150 - 450 x10E3/uL  BMP8+Anion Gap  Result Value Ref Range   Glucose 193 (H) 70 - 99 mg/dL   BUN 17 8 - 27 mg/dL   Creatinine, Ser 2.88 (H) 0.76 - 1.27 mg/dL   eGFR 24 (L) >59 mL/min/1.73   BUN/Creatinine Ratio 6 (L) 10 - 24   Sodium 141 134 - 144 mmol/L   Potassium 3.8 3.5 - 5.2 mmol/L   Chloride 100 96 - 106 mmol/L   CO2 26 20 - 29 mmol/L   Anion Gap 15.0 10.0 - 18.0 mmol/L   Calcium  9.4 8.6 - 10.2 mg/dL    Last metabolic panel Lab Results  Component Value Date   GLUCOSE 193 (H) 10/18/2021   NA 141 10/18/2021   K 3.8 10/18/2021   CL 100 10/18/2021   CO2 26 10/18/2021   BUN 17 10/18/2021   CREATININE 2.88 (H) 10/18/2021   EGFR 24 (L) 10/18/2021   CALCIUM 9.4 10/18/2021   PHOS 2.7 10/05/2021   PROT 7.2 01/19/2021   ALBUMIN 2.5 (L) 10/05/2021   LABGLOB 3.2 09/28/2021   AGRATIO 1.0 09/28/2021   BILITOT 1.0 01/19/2021   ALKPHOS 93 01/19/2021   AST 17 01/19/2021   ALT 13 01/19/2021   ANIONGAP 8 10/05/2021      The ASCVD Risk score (Arnett DK, et al., 2019) failed to calculate for the following reasons:   The patient has a prior MI or stroke diagnosis    Assessment & Plan:   Problem List Items Addressed This Visit       Cardiovascular and Mediastinum   CAD (coronary artery disease)    His lisinopril and statin were stopped due to acute renal failure and ESRD.  He continues on Coreg, aspirin, and Plavix.  BP remains well controlled today.         Digestive   Gastroesophageal reflux disease with esophagitis without hemorrhage     Endocrine   Diabetes mellitus type 2 with neurological manifestations ([place])   Relevant Medications   Accu-Chek FastClix Lancets MISC   Type 2 diabetes mellitus with complication, without long-term current use of insulin (HCC)    Recent A1c is 7.3% during admission.   He is only taking Trulicity 1.5 mg weekly.  He was discontinued on his Jardiance and metformin due to acute renal failure at his hospitalization. He has not been checking fingerstick glucose.  His sister cannot find his meter or any test strips.  Will reorder meter and supplies for this.  Follow-up in 4 weeks patient will bring meter to his next visit.       Relevant Medications   Blood Glucose Monitoring Suppl (ONETOUCH VERIO REFLECT) w/Device KIT     Genitourinary   CKD (chronic kidney disease) stage 4, GFR 15-29 ml/min (HCC)    Patient was really  admitted for acute renal failure between 09/27/2021 to 10/05/2021 nail biopsy was confirmed showing diabetic changes.  No reversible causes for his ESRD and he was started on  hemodialysis.  He is continued to receive outpatient hemodialysis through a tunneled IJ on Tuesday, Thursdays, and Saturdays.  He has been receiving outpatient dialysis since discharge.  Sister and family have been transporting him to this and have they have no issues with getting patient to dialysis.  He states he continues to make urine.  He has been off his torsemide since discharge.  He appears euvolemic without lower extremity edema, will continue to hold his torsemide at this time.  BMP in office today with BUN of 17 and creatinine improved from 6.21 on discharge to 2.88 GFR is 24 consistent with CKD 4.  Sister was unaware that he had follow-up with [facility] kidney on 10/06/2021.  Provided contact information for him to establish with nephrology we will also need to discuss with them whether he needs to continue dialysis given he continues to make good urine output and has had much improvement in his kidney function.  Follow-up with nephrology to establish care and discuss need for vascular access if he needs to continue HD       Acute renal failure ([place]) - Primary   Relevant Orders   BMP8+Anion Gap (Completed)     Other   Uremia   Relevant Orders   CBC no Diff (Completed)    Return in about 4 weeks (around 11/15/2021) for diabetes.    Iona Beard, MD

## 2021-10-27 ENCOUNTER — Telehealth: Payer: Self-pay | Admitting: *Deleted

## 2021-10-27 NOTE — Telephone Encounter (Deleted)
[facility] Progression Recent Vital Signs   '@VS'$ @   Past Medical History:  Diagnosis Date   Anemia    CAD (coronary artery disease)    cath 8/12:  LM ok, mLAD occluded with trivial collats from right AM and dRCA, mDx (small) 80%, RI 25%, tiny branch of OM 90%, mAM 30-40%, RCA sub-totally occluded, then 80%, EF 40%, inf HK.  He underwent PCI with [name] with placement of a Promus DES to the mRCA x 2   Chronic kidney disease    stage 3 per chart   Diabetes mellitus    Dyslipidemia    ED (erectile dysfunction)    Gait instability 02/27/2011   History of alcohol abuse    History of stomach ulcers    History of stroke    evidence of CVA on MRI in past   Hyperlipidemia    Hypertension    Ischemic cardiomyopathy    echo 7/12: EF 40-45%, septal, apical, inf basal HK, mod LVH, mild MR, mild LAE.  EF 55% by echo 2015   Macular hemorrhage    Prostate cancer ([place])    Sleep apnea    no c-pap; wife denies   Vision, loss, sudden 02/27/2011     Expected Discharge Date  '@FLOW'$ (453646::8)@  Diet Order     None        VTE Documentation  '@FLOW'$ (0321224::8)@   Work Intensity Score/Level of Care  '@FLOW'$ (10536::1)@  '@LEVELOFCARE'$ @   Mobility  '@FLOW'$ (7060220::1)@     Significant Events    DC Barriers   Abnormal Labs:

## 2021-10-27 NOTE — Telephone Encounter (Signed)
Call from Herbert Deaner PT from [facility] patient has a note stating that his Carvedilol dosage has been changed to 1/2/ of the 25 mg tablet twice daily.  Patient has had some low blood pressures.  Today B/Ps were sitting 100/70 and 80/60.  Patient is asymptomatic.  Call to the Dialysis Center that patient attends  at [phone] to verify the change in dosing.  Clair Gulling can be reached at [phone] to verify the change.  Spoke to [place] at the [facility] who saw a note written by Veneta Penton, PA with the Dialysis Center to change the dosing of the Carvedilol to 1/2 tablet of the 25 mg 12.5 mg twice daily.

## 2021-10-31 NOTE — Progress Notes (Signed)
Internal Medicine Clinic Attending ? ?Case discussed with Dr. Liang  At the time of the visit.  We reviewed the resident?s history and exam and pertinent patient test results.  I agree with the assessment, diagnosis, and plan of care documented in the resident?s note. ? ?

## 2021-11-03 ENCOUNTER — Telehealth: Payer: Self-pay | Admitting: *Deleted

## 2021-11-03 ENCOUNTER — Encounter: Payer: Medicare Other | Admitting: Student

## 2021-11-03 NOTE — Telephone Encounter (Signed)
Received call from Avelino Leeds, PT with [facility] reporting BP today seated 100/60 and standing 80/50. Patient is asymptomatic. He is encouraged to drink plenty of fluids, especially water. Also, advised he go immediately to ED if starts to feel symptoms such as lightheadedness or difficulties with balance. Patient is being d/c today from all [facility] services.  Of note, patient no showed today's appt. He has r/s for 6/21 at 0845.

## 2021-11-08 ENCOUNTER — Encounter: Payer: Self-pay | Admitting: Internal Medicine

## 2021-11-08 ENCOUNTER — Ambulatory Visit (INDEPENDENT_AMBULATORY_CARE_PROVIDER_SITE_OTHER): Payer: Medicare Other | Admitting: Internal Medicine

## 2021-11-08 DIAGNOSIS — I152 Hypertension secondary to endocrine disorders: Secondary | ICD-10-CM | POA: Diagnosis not present

## 2021-11-08 DIAGNOSIS — Z7985 Long-term (current) use of injectable non-insulin antidiabetic drugs: Secondary | ICD-10-CM

## 2021-11-08 DIAGNOSIS — I2510 Atherosclerotic heart disease of native coronary artery without angina pectoris: Secondary | ICD-10-CM

## 2021-11-08 DIAGNOSIS — I120 Hypertensive chronic kidney disease with stage 5 chronic kidney disease or end stage renal disease: Secondary | ICD-10-CM

## 2021-11-08 DIAGNOSIS — E1159 Type 2 diabetes mellitus with other circulatory complications: Secondary | ICD-10-CM

## 2021-11-08 DIAGNOSIS — Z0000 Encounter for general adult medical examination without abnormal findings: Secondary | ICD-10-CM

## 2021-11-08 DIAGNOSIS — I6310 Cerebral infarction due to embolism of unspecified precerebral artery: Secondary | ICD-10-CM

## 2021-11-08 DIAGNOSIS — E1169 Type 2 diabetes mellitus with other specified complication: Secondary | ICD-10-CM

## 2021-11-08 DIAGNOSIS — Z992 Dependence on renal dialysis: Secondary | ICD-10-CM

## 2021-11-08 DIAGNOSIS — E1122 Type 2 diabetes mellitus with diabetic chronic kidney disease: Secondary | ICD-10-CM

## 2021-11-08 DIAGNOSIS — E785 Hyperlipidemia, unspecified: Secondary | ICD-10-CM

## 2021-11-08 DIAGNOSIS — E118 Type 2 diabetes mellitus with unspecified complications: Secondary | ICD-10-CM

## 2021-11-08 DIAGNOSIS — N186 End stage renal disease: Secondary | ICD-10-CM | POA: Insufficient documentation

## 2021-11-08 MED ORDER — ONETOUCH VERIO VI STRP: ORAL_STRIP | 3 refills | Status: DC

## 2021-11-08 MED ORDER — TRULICITY 3 MG/0.5ML ~~LOC~~ SOAJ: 3.0000 mg | SUBCUTANEOUS | 2 refills | Status: DC

## 2021-11-08 NOTE — Assessment & Plan Note (Signed)
Up to date on HM except shingles shot which sister states that he plans on obtaining soon.

## 2021-11-08 NOTE — Assessment & Plan Note (Addendum)
Well controlled with carvedilol 25 mg and HCTZ 25 mg. Tolerating medications well. Continue medications.

## 2021-11-08 NOTE — Assessment & Plan Note (Signed)
LDL 69 in 2/23. He was previously on Atorvastatin 80 but was discontinued during previous hospitalization d/t renal biopsy showing occasional myoglobin casts. He reports having starting statin 4 weeks ago at the instruction of a home health nurse. Advised to dc statin, and have reached out to Dr Royce Macadamia to determine if it is appropriate to resume his statin. Please see CAD assessment in plan for more details.

## 2021-11-08 NOTE — Assessment & Plan Note (Addendum)
He continues on Coreg, aspirin, and Plavix. Atorvastatin was discontinued in May during admission for AKI on CKD 4 with rapid progression to ESRD on HD by nephrology due to renal biopsy showing tubular injury with occasional myoglobin casts along with chronic diabetic changes. He reports having resumed statin 4 weeks ago at the instruction of a home health nurse. Advised to dc statin, and have reached out to Dr Royce Macadamia to determine if it is appropriate to resume his statin for secondary prevention. Can consider bempedoic acid if pt cannot resume statin, though it is associated with tendon rupture in patient's with renal failure. Messaged Dr Royce Macadamia.

## 2021-11-08 NOTE — Assessment & Plan Note (Signed)
Continues with ASA and plavix. See CAD assessment and plan for information on his statin use.

## 2021-11-08 NOTE — Assessment & Plan Note (Signed)
A1c 7.3 ~1 month ago. Regimen consists of Trulicity 1.5 weekly. Good med compliance and tolerating med well. He brought his meter with him but only has 3 readings, with average of 148 (high of 171 and low of 129). He reports feeling no lows, and reports feeling well overall. Counseled on daily monitoring, and pt and pt's sister reports they will be able to do this. We will up-titrate Trulicity to 3.0 mg weekly for better DM control and follow up in 4 weeks with meter.

## 2021-11-08 NOTE — Patient Instructions (Signed)
Diabetes: Start taking trulicity 3.0 mg weekly instead of 1.5 mg. Continue checking your sugars daily and bring meter with you during follow up   Cholesterol: Stop taking Atorvastatin for now  Medications refilled

## 2021-11-08 NOTE — Progress Notes (Signed)
CC: routine clinic visit for DM    HPI:  [name] is a 61 y.o. male with a PMHx stated below and presents today for stated above. Please see the Encounters tab for problem-based Assessment & Plan for additional details.   Past Medical History:  Diagnosis Date   Anemia    CAD (coronary artery disease)    cath 8/12:  LM ok, mLAD occluded with trivial collats from right AM and dRCA, mDx (small) 80%, RI 25%, tiny branch of OM 90%, mAM 30-40%, RCA sub-totally occluded, then 80%, EF 40%, inf HK.  He underwent PCI with [name] with placement of a Promus DES to the mRCA x 2   Chronic kidney disease    stage 3 per chart   Diabetes mellitus    Dyslipidemia    ED (erectile dysfunction)    Gait instability 02/27/2011   History of alcohol abuse    History of stomach ulcers    History of stroke    evidence of CVA on MRI in past   Hyperlipidemia    Hypertension    Ischemic cardiomyopathy    echo 7/12: EF 40-45%, septal, apical, inf basal HK, mod LVH, mild MR, mild LAE.  EF 55% by echo 2015   Macular hemorrhage    Prostate cancer ([place])    Sleep apnea    no c-pap; wife denies   Vision, loss, sudden 02/27/2011    Current Outpatient Medications on File Prior to Visit  Medication Sig Dispense Refill   Accu-Chek FastClix Lancets MISC Use to check blood sugar up to 7 times a week 102 each 4   acetaminophen (TYLENOL) 325 MG tablet Take 2 tablets (650 mg total) by mouth every 6 (six) hours as needed for mild pain (or Fever >/= 101). 30 tablet 0   aspirin EC 81 MG tablet Take 1 tablet (81 mg total) by mouth daily. 30 tablet 11   Blood Glucose Monitoring Suppl (ONETOUCH VERIO REFLECT) w/Device KIT Check blood sugar 1 time a day 1 kit 0   carvedilol (COREG) 25 MG tablet Take 1 tablet (25 mg total) by mouth 2 (two) times daily with a meal. 60 tablet 0   clopidogrel (PLAVIX) 75 MG tablet Take 1 tablet (75 mg total) by mouth daily. 90 tablet 3   dorzolamide-timolol (COSOPT) 22.3-6.8  MG/ML ophthalmic solution Place 1 drop into both eyes daily.     Dulaglutide (TRULICITY) 1.5 HO/1.2YQ SOPN INJECT SUBCUTANEOUSLY 1.5  MG INTO THE SKIN WEEKLY (Patient taking differently: Inject 1.5 mg into the skin once a week.) 2 mL 11   fluticasone (FLONASE) 50 MCG/ACT nasal spray Place 2 sprays into both nostrils daily. 16 g 1   glucose blood (ONETOUCH VERIO) test strip Check blood sugar 1 time a day 100 each 3   Lancets (ONETOUCH DELICA PLUS [id number]) MISC Check blood sugar 1 time a day 100 each 3   nitroGLYCERIN (NITROSTAT) 0.4 MG SL tablet Place 1 tablet (0.4 mg total) under the tongue every 5 (five) minutes as needed. 100 tablet 1   pantoprazole (PROTONIX) 40 MG tablet Take 1 tablet (40 mg total) by mouth 2 (two) times daily. 60 tablet 0   senna-docusate (SENOKOT-S) 8.6-50 MG tablet Take 1 tablet by mouth at bedtime as needed for mild constipation. 30 tablet 0   tamsulosin (FLOMAX) 0.4 MG CAPS capsule Take 1 capsule (0.4 mg total) by mouth daily. 30 capsule 11   Vitamin D, Ergocalciferol, (DRISDOL) 1.25 MG (50000 UNIT) CAPS capsule  Take 50,000 Units by mouth every 7 (seven) days.     No current facility-administered medications on file prior to visit.    Family History  Adopted: Yes  Problem Relation Age of Onset   Heart attack Mother        Died 97   Heart failure Mother    Diabetes Mother    Heart attack Father    Heart failure Father    Diabetes Father    Prostate cancer Father    Cancer Father        Prostate   Heart disease Sister        s/p CABG x3 in her mid 55's   Crohn's disease Sister    Crohn's disease Other        niece   Kidney disease Brother     Social History   Socioeconomic History   Marital status: Married    Spouse name: Astronomer   Number of children: 0   Years of education: Not on file   Highest education level: Not on file  Occupational History   Occupation: disabled  Tobacco Use   Smoking status: Never   Smokeless tobacco: Never  Vaping Use    Vaping Use: Never used  Substance and Sexual Activity   Alcohol use: Yes    Alcohol/week: 4.0 standard drinks of alcohol    Types: 2 Cans of beer, 2 Shots of liquor per week   Drug use: No   Sexual activity: Not on file  Other Topics Concern   Not on file  Social History Narrative   [place] in [place] with his cousinNo childrenExercises 3 times a week   Right handed    Social Determinants of Health   Financial Resource Strain: Not on file  Food Insecurity: Not on file  Transportation Needs: Not on file  Physical Activity: Not on file  Stress: Not on file  Social Connections: Not on file  Intimate Partner Violence: Not on file    Review of Systems: ROS negative except for what is noted on the assessment and plan.  Vitals:   11/08/21 1329  BP: 133/78  Pulse: 93  Temp: 98.1 F (36.7 C)  TempSrc: Oral  SpO2: 100%  Weight: 197 lb 3.2 oz (89.4 kg)  Height: 6' (1.829 m)    Physical Exam: Constitutional: alert, well-appearing, in NAD Cardiovascular: RRR, no m/r/g, non-edematous bilateral LE Pulmonary/Chest: normal work of breathing on RA, LCTAB Neurological: A&O x 3 and follow commands  Skin: warm and dry  Psych: normal behavior, normal affect   Assessment & Plan:   See Encounters Tab for problem based charting.  Patient discussed with Dr. Lavonda Jumbo, MD  Internal Medicine Resident, PGY-1 [facility]

## 2021-11-08 NOTE — Assessment & Plan Note (Signed)
He continues to receive outpatient hemodialysis through a tunneled IJ on Tuesday, Thursdays, and Saturdays. Reports tolerating well. Continue HD per nephrology.

## 2021-11-30 NOTE — Progress Notes (Signed)
Cardiology Office Note   Date:  12/01/2021   ID:  Kevin Soto, DOB 06/06/1960, MRN [id number]  PCP:  Romana Juniper, MD  Cardiologist:   None   Chief Complaint  Patient presents with   Coronary Artery Disease      History of Present Illness: Kevin Soto is a 61 y.o. male who presents for follow up of CAD.   Since I last saw him he was in the hospital in May.   He had uremia.  He was dehydrated.  At discharge hydrochlorothiazide lisinopril Jardiance and metformin were discharged.  Blood pressures were uncontrolled.  He returns for follow-up.  Looking at his follow-up and discharge notes he was also taken off of statin apparently because of myoglobin casts on a renal biopsy.  He is actually having dialysis now 3 times a week.  He walks very slowly with a cane.  He does not report any problems.  His sister brought him in today but she is not in the room.  He does not report any presyncope or syncope.  His blood pressure is low but this is nothing usual and does not report having any problems with dialysis.  He denies any chest discomfort, neck or arm discomfort.  Has had no new shortness of breath, PND or orthopnea.  There has been no report of cough fevers or chills   Past Medical History:  Diagnosis Date   Anemia    CAD (coronary artery disease)    cath 8/12:  LM ok, mLAD occluded with trivial collats from right AM and dRCA, mDx (small) 80%, RI 25%, tiny branch of OM 90%, mAM 30-40%, RCA sub-totally occluded, then 80%, EF 40%, inf HK.  He underwent PCI with [name] with placement of a Promus DES to the mRCA x 2   Chronic kidney disease    stage 3 per chart   Diabetes mellitus    Dyslipidemia    ED (erectile dysfunction)    Gait instability 02/27/2011   History of alcohol abuse    History of stomach ulcers    History of stroke    evidence of CVA on MRI in past   Hyperlipidemia    Hypertension    Ischemic cardiomyopathy    echo 7/12: EF 40-45%, septal,  apical, inf basal HK, mod LVH, mild MR, mild LAE.  EF 55% by echo 2015   Macular hemorrhage    Prostate cancer ([place])    Sleep apnea    no c-pap; wife denies   Vision, loss, sudden 02/27/2011    Past Surgical History:  Procedure Laterality Date   BIOPSY  10/04/2021   Procedure: BIOPSY;  Surgeon: Lavena Bullion, DO;  Location: [facility];  Service: Gastroenterology;;   CORONARY ANGIOPLASTY WITH STENT PLACEMENT  2012   2 stents   ESOPHAGOGASTRODUODENOSCOPY (EGD) WITH PROPOFOL N/A 10/04/2021   Procedure: ESOPHAGOGASTRODUODENOSCOPY (EGD) WITH PROPOFOL;  Surgeon: Lavena Bullion, DO;  Location: [facility];  Service: Gastroenterology;  Laterality: N/A;   IR FLUORO GUIDE CV LINE RIGHT  09/29/2021   IR US GUIDE VASC ACCESS RIGHT  09/29/2021   RADIOACTIVE SEED IMPLANT N/A 05/08/2017   Procedure: RADIOACTIVE SEED IMPLANT/BRACHYTHERAPY IMPLANT;  Surgeon: Alexis Frock, MD;  Location: [facility];  Service: Urology;  Laterality: N/A;   REFRACTIVE SURGERY  2012   right and left eye/ per pt only left eye   SPACE OAR INSTILLATION N/A 05/08/2017   Procedure: SPACE OAR INSTILLATION;  Surgeon: Alexis Frock, MD;  Location: [place];  Service: Urology;  Laterality: N/A;     Current Outpatient Medications  Medication Sig Dispense Refill   Accu-Chek FastClix Lancets MISC Use to check blood sugar up to 7 times a week 102 each 4   acetaminophen (TYLENOL) 325 MG tablet Take 2 tablets (650 mg total) by mouth every 6 (six) hours as needed for mild pain (or Fever >/= 101). 30 tablet 0   aspirin EC 81 MG tablet Take 1 tablet (81 mg total) by mouth daily. 30 tablet 11   Blood Glucose Monitoring Suppl (ONETOUCH VERIO REFLECT) w/Device KIT Check blood sugar 1 time a day 1 kit 0   carvedilol (COREG) 25 MG tablet Take 1 tablet (25 mg total) by mouth 2 (two) times daily with a meal. 60 tablet 0   clopidogrel (PLAVIX) 75 MG tablet Take 1 tablet (75 mg total) by mouth daily.  90 tablet 3   dorzolamide-timolol (COSOPT) 22.3-6.8 MG/ML ophthalmic solution Place 1 drop into both eyes daily.     Dulaglutide (TRULICITY) 3 XL/2.4MW SOPN Inject 3 mg as directed once a week. 2 mL 2   Empagliflozin-metFORMIN HCl ER (SYNJARDY XR) 09-998 MG TB24 Take 5-1,000 mg by mouth. Take 1 tablet by mouth twice a day.     fluticasone (FLONASE) 50 MCG/ACT nasal spray Place 2 sprays into both nostrils daily. 16 g 1   glucose blood (ONETOUCH VERIO) test strip Check blood sugar 1 time a day 100 each 3   hydrochlorothiazide (HYDRODIURIL) 25 MG tablet Take 25 mg by mouth daily. Take 1 tablet by mouth daily     Lancets (ONETOUCH DELICA PLUS [id number]) MISC Check blood sugar 1 time a day 100 each 3   lisinopril (ZESTRIL) 20 MG tablet Take 20 mg by mouth daily. Take 1 tablet by mouth daily .     nitroGLYCERIN (NITROSTAT) 0.4 MG SL tablet Place 1 tablet (0.4 mg total) under the tongue every 5 (five) minutes as needed. 100 tablet 1   pantoprazole (PROTONIX) 40 MG tablet Take 1 tablet (40 mg total) by mouth 2 (two) times daily. 60 tablet 0   senna-docusate (SENOKOT-S) 8.6-50 MG tablet Take 1 tablet by mouth at bedtime as needed for mild constipation. 30 tablet 0   tamsulosin (FLOMAX) 0.4 MG CAPS capsule Take 1 capsule (0.4 mg total) by mouth daily. 30 capsule 11   Vitamin D, Ergocalciferol, (DRISDOL) 1.25 MG (50000 UNIT) CAPS capsule Take 50,000 Units by mouth every 7 (seven) days.     No current facility-administered medications for this visit.    Allergies:   Clonidine derivatives    ROS:  Please see the history of present illness.   Otherwise, review of systems are positive for none.   All other systems are reviewed and negative.    PHYSICAL EXAM: VS:  BP 90/60   Pulse (!) 106   Ht 6' (1.829 m)   Wt 192 lb (87.1 kg)   SpO2 98%   BMI 26.04 kg/m  , BMI Body mass index is 26.04 kg/m. GENERAL: Very frail appearing for his age NECK:  No jugular venous distention, waveform within normal  limits, carotid upstroke brisk and symmetric, no bruits, no thyromegaly LUNGS:  Clear to auscultation bilaterally CHEST:  Unremarkable, dialysis catheter in place HEART:  PMI not displaced or sustained,S1 and S2 within normal limits, no S3, no S4, no clicks, no rubs, no murmurs ABD:  Flat, positive bowel sounds normal in frequency in pitch, no bruits, no rebound, no  guarding, no midline pulsatile mass, no hepatomegaly, no splenomegaly EXT:  2 plus pulses throughout, no edema, no cyanosis no clubbing   EKG:  EKG is  ordered today. The ekg ordered today demonstrates sinus rhythm, rate 102, axis within normal limits, intervals within normal limits, no acute ST-T wave changes.  Inferolateral T wave inversions are not changed from previous.   Recent Labs: 01/19/2021: ALT 13 09/27/2021: TSH 1.525 10/18/2021: BUN 17; Creatinine, Ser 2.88; Hemoglobin 9.3; Platelets 327; Potassium 3.8; Sodium 141    Lipid Panel    Component Value Date/Time   CHOL 128 07/14/2021 1033   TRIG 110 07/14/2021 1033   HDL 39 (L) 07/14/2021 1033   CHOLHDL 3.3 07/14/2021 1033   CHOLHDL 3.3 08/17/2014 1532   VLDL 17 08/17/2014 1532   LDLCALC 69 07/14/2021 1033      Wt Readings from Last 3 Encounters:  12/01/21 192 lb (87.1 kg)  11/08/21 197 lb 3.2 oz (89.4 kg)  10/18/21 202 lb 1.6 oz (91.7 kg)      Other studies Reviewed: Additional studies/ records that were reviewed today include: Hospital records and primary care office records. Review of the above records demonstrates:  Please see elsewhere in the note.     ASSESSMENT AND PLAN:  CAD:    The patient has no new sypmtoms.  No further cardiovascular testing is indicated  HTN:   The blood pressure is low today but apparently this is not an issue.  This can be followed closely at dialysis.  If he remains hypotensive or has any symptoms related to this we would have to back off on potentially his beta-blocker.   DYSLIPIDEMIA: LDL was not at target but he is  now off the statin.  He is not going to return to this but apparently his primary providers are considering PCSK9 which I think would be reasonable.  I will defer to their management.   DM:   His diabetes medications have been adjusted recently per his primary providers.   ESRD:   The patient is now dialysis dependent.  No change in therapy.  Current medicines are reviewed at length with the patient today.  The patient does not have concerns regarding medicines.  The following changes have been made: None  Labs/ tests ordered today include: None  Orders Placed This Encounter  Procedures   EKG 12-Lead     Disposition:   FU with APP in 6 weeks   Signed, Minus Breeding, MD  12/01/2021 12:09 PM    [place]

## 2021-12-01 ENCOUNTER — Encounter: Payer: Self-pay | Admitting: Cardiology

## 2021-12-01 ENCOUNTER — Ambulatory Visit: Payer: Medicare Other | Admitting: Cardiology

## 2021-12-01 VITALS — BP 90/60 | HR 106 | Ht 72.0 in | Wt 192.0 lb

## 2021-12-01 DIAGNOSIS — E118 Type 2 diabetes mellitus with unspecified complications: Secondary | ICD-10-CM

## 2021-12-01 DIAGNOSIS — I10 Essential (primary) hypertension: Secondary | ICD-10-CM

## 2021-12-01 DIAGNOSIS — E785 Hyperlipidemia, unspecified: Secondary | ICD-10-CM | POA: Diagnosis not present

## 2021-12-01 DIAGNOSIS — I2510 Atherosclerotic heart disease of native coronary artery without angina pectoris: Secondary | ICD-10-CM | POA: Diagnosis not present

## 2021-12-01 NOTE — Patient Instructions (Signed)
  Follow-Up: At [facility], you and your health needs are our priority.  As part of our continuing mission to provide you with exceptional heart care, we have created designated Provider Care Teams.  These Care Teams include your primary Cardiologist (physician) and Advanced Practice Providers (APPs -  Physician Assistants and Nurse Practitioners) who all work together to provide you with the care you need, when you need it.  We recommend signing up for the patient portal called "MyChart".  Sign up information is provided on this After Visit Summary.  MyChart is used to connect with patients for Virtual Visits (Telemedicine).  Patients are able to view lab/test results, encounter notes, upcoming appointments, etc.  Non-urgent messages can be sent to your provider as well.   To learn more about what you can do with MyChart, go to [email or web address].    Your next appointment:   6 month(s)  The format for your next appointment:   In Person  Provider:   Minus Breeding MD      Important Information About Sugar

## 2021-12-03 ENCOUNTER — Observation Stay (HOSPITAL_COMMUNITY): Payer: Medicare Other

## 2021-12-03 ENCOUNTER — Other Ambulatory Visit: Payer: Self-pay

## 2021-12-03 ENCOUNTER — Inpatient Hospital Stay (HOSPITAL_COMMUNITY)
Admission: EM | Admit: 2021-12-03 | Discharge: 2021-12-12 | DRG: 871 | Disposition: A | Discharge status: Skilled Nursing Facility | Payer: Medicare Other | Attending: Internal Medicine | Admitting: Internal Medicine

## 2021-12-03 ENCOUNTER — Emergency Department (HOSPITAL_COMMUNITY): Payer: Medicare Other

## 2021-12-03 DIAGNOSIS — B1910 Unspecified viral hepatitis B without hepatic coma: Secondary | ICD-10-CM | POA: Diagnosis not present

## 2021-12-03 DIAGNOSIS — A419 Sepsis, unspecified organism: Secondary | ICD-10-CM | POA: Diagnosis present

## 2021-12-03 DIAGNOSIS — E1165 Type 2 diabetes mellitus with hyperglycemia: Secondary | ICD-10-CM | POA: Diagnosis present

## 2021-12-03 DIAGNOSIS — Z888 Allergy status to other drugs, medicaments and biological substances status: Secondary | ICD-10-CM

## 2021-12-03 DIAGNOSIS — Z8711 Personal history of peptic ulcer disease: Secondary | ICD-10-CM

## 2021-12-03 DIAGNOSIS — Z8042 Family history of malignant neoplasm of prostate: Secondary | ICD-10-CM

## 2021-12-03 DIAGNOSIS — R571 Hypovolemic shock: Secondary | ICD-10-CM | POA: Diagnosis present

## 2021-12-03 DIAGNOSIS — I69398 Other sequelae of cerebral infarction: Secondary | ICD-10-CM | POA: Diagnosis not present

## 2021-12-03 DIAGNOSIS — N179 Acute kidney failure, unspecified: Secondary | ICD-10-CM | POA: Diagnosis present

## 2021-12-03 DIAGNOSIS — D649 Anemia, unspecified: Secondary | ICD-10-CM | POA: Diagnosis not present

## 2021-12-03 DIAGNOSIS — I2510 Atherosclerotic heart disease of native coronary artery without angina pectoris: Secondary | ICD-10-CM | POA: Diagnosis present

## 2021-12-03 DIAGNOSIS — I255 Ischemic cardiomyopathy: Secondary | ICD-10-CM | POA: Diagnosis present

## 2021-12-03 DIAGNOSIS — I420 Dilated cardiomyopathy: Secondary | ICD-10-CM | POA: Diagnosis present

## 2021-12-03 DIAGNOSIS — E876 Hypokalemia: Secondary | ICD-10-CM | POA: Diagnosis present

## 2021-12-03 DIAGNOSIS — E785 Hyperlipidemia, unspecified: Secondary | ICD-10-CM | POA: Diagnosis present

## 2021-12-03 DIAGNOSIS — Z923 Personal history of irradiation: Secondary | ICD-10-CM

## 2021-12-03 DIAGNOSIS — Z7985 Long-term (current) use of injectable non-insulin antidiabetic drugs: Secondary | ICD-10-CM | POA: Diagnosis not present

## 2021-12-03 DIAGNOSIS — R2689 Other abnormalities of gait and mobility: Secondary | ICD-10-CM | POA: Diagnosis present

## 2021-12-03 DIAGNOSIS — F1010 Alcohol abuse, uncomplicated: Secondary | ICD-10-CM | POA: Diagnosis present

## 2021-12-03 DIAGNOSIS — D631 Anemia in chronic kidney disease: Secondary | ICD-10-CM | POA: Diagnosis present

## 2021-12-03 DIAGNOSIS — E1149 Type 2 diabetes mellitus with other diabetic neurological complication: Secondary | ICD-10-CM | POA: Diagnosis present

## 2021-12-03 DIAGNOSIS — R197 Diarrhea, unspecified: Secondary | ICD-10-CM | POA: Diagnosis present

## 2021-12-03 DIAGNOSIS — R6521 Severe sepsis with septic shock: Secondary | ICD-10-CM | POA: Diagnosis present

## 2021-12-03 DIAGNOSIS — R531 Weakness: Secondary | ICD-10-CM | POA: Diagnosis present

## 2021-12-03 DIAGNOSIS — Z751 Person awaiting admission to adequate facility elsewhere: Secondary | ICD-10-CM

## 2021-12-03 DIAGNOSIS — Z992 Dependence on renal dialysis: Secondary | ICD-10-CM

## 2021-12-03 DIAGNOSIS — I959 Hypotension, unspecified: Secondary | ICD-10-CM | POA: Diagnosis not present

## 2021-12-03 DIAGNOSIS — E1169 Type 2 diabetes mellitus with other specified complication: Secondary | ICD-10-CM | POA: Diagnosis present

## 2021-12-03 DIAGNOSIS — I120 Hypertensive chronic kidney disease with stage 5 chronic kidney disease or end stage renal disease: Secondary | ICD-10-CM | POA: Diagnosis present

## 2021-12-03 DIAGNOSIS — F0390 Unspecified dementia without behavioral disturbance: Secondary | ICD-10-CM | POA: Diagnosis present

## 2021-12-03 DIAGNOSIS — E1122 Type 2 diabetes mellitus with diabetic chronic kidney disease: Secondary | ICD-10-CM | POA: Diagnosis present

## 2021-12-03 DIAGNOSIS — R579 Shock, unspecified: Secondary | ICD-10-CM | POA: Diagnosis not present

## 2021-12-03 DIAGNOSIS — R159 Full incontinence of feces: Secondary | ICD-10-CM | POA: Diagnosis present

## 2021-12-03 DIAGNOSIS — E871 Hypo-osmolality and hyponatremia: Secondary | ICD-10-CM | POA: Diagnosis not present

## 2021-12-03 DIAGNOSIS — Z8249 Family history of ischemic heart disease and other diseases of the circulatory system: Secondary | ICD-10-CM

## 2021-12-03 DIAGNOSIS — N400 Enlarged prostate without lower urinary tract symptoms: Secondary | ICD-10-CM | POA: Diagnosis present

## 2021-12-03 DIAGNOSIS — I639 Cerebral infarction, unspecified: Secondary | ICD-10-CM | POA: Diagnosis present

## 2021-12-03 DIAGNOSIS — Z79899 Other long term (current) drug therapy: Secondary | ICD-10-CM

## 2021-12-03 DIAGNOSIS — Z7902 Long term (current) use of antithrombotics/antiplatelets: Secondary | ICD-10-CM

## 2021-12-03 DIAGNOSIS — Z7984 Long term (current) use of oral hypoglycemic drugs: Secondary | ICD-10-CM

## 2021-12-03 DIAGNOSIS — K219 Gastro-esophageal reflux disease without esophagitis: Secondary | ICD-10-CM | POA: Diagnosis present

## 2021-12-03 DIAGNOSIS — N186 End stage renal disease: Secondary | ICD-10-CM | POA: Diagnosis present

## 2021-12-03 DIAGNOSIS — Z841 Family history of disorders of kidney and ureter: Secondary | ICD-10-CM

## 2021-12-03 DIAGNOSIS — Z833 Family history of diabetes mellitus: Secondary | ICD-10-CM

## 2021-12-03 DIAGNOSIS — M898X9 Other specified disorders of bone, unspecified site: Secondary | ICD-10-CM | POA: Diagnosis present

## 2021-12-03 DIAGNOSIS — Z955 Presence of coronary angioplasty implant and graft: Secondary | ICD-10-CM

## 2021-12-03 DIAGNOSIS — Z8546 Personal history of malignant neoplasm of prostate: Secondary | ICD-10-CM

## 2021-12-03 HISTORY — DX: Sepsis, unspecified organism: A41.9

## 2021-12-03 LAB — CBC WITH DIFFERENTIAL/PLATELET
Abs Immature Granulocytes: 0.53 K/uL — ABNORMAL HIGH (ref 0.00–0.07)
Basophils Absolute: 0.1 K/uL (ref 0.0–0.1)
Basophils Relative: 0 %
Eosinophils Absolute: 0 K/uL (ref 0.0–0.5)
Eosinophils Relative: 0 %
HCT: 31.4 % — ABNORMAL LOW (ref 39.0–52.0)
Hemoglobin: 10.4 g/dL — ABNORMAL LOW (ref 13.0–17.0)
Immature Granulocytes: 2 %
Lymphocytes Relative: 3 %
Lymphs Abs: 1 K/uL (ref 0.7–4.0)
MCH: 29.1 pg (ref 26.0–34.0)
MCHC: 33.1 g/dL (ref 30.0–36.0)
MCV: 88 fL (ref 80.0–100.0)
Monocytes Absolute: 2.7 K/uL — ABNORMAL HIGH (ref 0.1–1.0)
Monocytes Relative: 9 %
Neutro Abs: 27.9 K/uL — ABNORMAL HIGH (ref 1.7–7.7)
Neutrophils Relative %: 86 %
Platelets: 276 K/uL (ref 150–400)
RBC: 3.57 MIL/uL — ABNORMAL LOW (ref 4.22–5.81)
RDW: 14.8 % (ref 11.5–15.5)
WBC: 32.2 K/uL — ABNORMAL HIGH (ref 4.0–10.5)
nRBC: 0 % (ref 0.0–0.2)

## 2021-12-03 LAB — COMPREHENSIVE METABOLIC PANEL
ALT: 14 U/L (ref 0–44)
AST: 19 U/L (ref 15–41)
Albumin: 3 g/dL — ABNORMAL LOW (ref 3.5–5.0)
Alkaline Phosphatase: 72 U/L (ref 38–126)
Anion gap: 16 — ABNORMAL HIGH (ref 5–15)
BUN: 29 mg/dL — ABNORMAL HIGH (ref 8–23)
CO2: 24 mmol/L (ref 22–32)
Calcium: 8.8 mg/dL — ABNORMAL LOW (ref 8.9–10.3)
Chloride: 95 mmol/L — ABNORMAL LOW (ref 98–111)
Creatinine, Ser: 4.28 mg/dL — ABNORMAL HIGH (ref 0.61–1.24)
GFR, Estimated: 15 mL/min — ABNORMAL LOW (ref >60)
Glucose, Bld: 219 mg/dL — ABNORMAL HIGH (ref 70–99)
Potassium: 3.3 mmol/L — ABNORMAL LOW (ref 3.5–5.1)
Sodium: 135 mmol/L (ref 135–145)
Total Bilirubin: 1.6 mg/dL — ABNORMAL HIGH (ref 0.3–1.2)
Total Protein: 7.2 g/dL (ref 6.5–8.1)

## 2021-12-03 LAB — PROCALCITONIN: Procalcitonin: 42.71 ng/mL

## 2021-12-03 LAB — TSH: TSH: 1.492 u[IU]/mL (ref 0.350–4.500)

## 2021-12-03 LAB — MAGNESIUM: Magnesium: 1.3 mg/dL — ABNORMAL LOW (ref 1.7–2.4)

## 2021-12-03 LAB — LACTIC ACID, PLASMA
Lactic Acid, Venous: 2 mmol/L (ref 0.5–1.9)
Lactic Acid, Venous: 2 mmol/L (ref 0.5–1.9)
Lactic Acid, Venous: 1.6 mmol/L (ref 0.5–1.9)

## 2021-12-03 LAB — CORTISOL: Cortisol, Plasma: 28.2 ug/dL

## 2021-12-03 LAB — CBG MONITORING, ED: Glucose-Capillary: 160 mg/dL — ABNORMAL HIGH (ref 70–99)

## 2021-12-03 LAB — OCCULT BLOOD X 1 CARD TO LAB, STOOL: Fecal Occult Bld: NEGATIVE

## 2021-12-03 MED ORDER — LACTATED RINGERS IV BOLUS
500.0000 mL | Freq: Once | INTRAVENOUS | Status: AC
  Administered 2021-12-03: 500 mL via INTRAVENOUS

## 2021-12-03 MED ORDER — MAGNESIUM SULFATE 2 GM/50ML IV SOLN
2.0000 g | Freq: Once | INTRAVENOUS | Status: AC
  Administered 2021-12-03: 2 g via INTRAVENOUS
  Filled 2021-12-03: qty 50

## 2021-12-03 MED ORDER — POLYETHYLENE GLYCOL 3350 17 G PO PACK: 17.0000 g | PACK | Freq: Every day | ORAL | Status: DC | PRN

## 2021-12-03 MED ORDER — NOREPINEPHRINE 4 MG/250ML-% IV SOLN
2.0000 ug/min | INTRAVENOUS | Status: DC
  Administered 2021-12-03: 2 ug/min via INTRAVENOUS
  Administered 2021-12-04: 5 ug/min via INTRAVENOUS
  Administered 2021-12-05: 6 ug/min via INTRAVENOUS
  Filled 2021-12-03 (×3): qty 250

## 2021-12-03 MED ORDER — LACTATED RINGERS IV SOLN: INTRAVENOUS | Status: DC

## 2021-12-03 MED ORDER — SENNOSIDES-DOCUSATE SODIUM 8.6-50 MG PO TABS: 1.0000 | ORAL_TABLET | Freq: Every evening | ORAL | Status: DC | PRN

## 2021-12-03 MED ORDER — CHLORHEXIDINE GLUCONATE CLOTH 2 % EX PADS
6.0000 | MEDICATED_PAD | Freq: Every day | CUTANEOUS | Status: DC
  Administered 2021-12-03 – 2021-12-12 (×10): 6 via TOPICAL

## 2021-12-03 MED ORDER — ASPIRIN 81 MG PO TBEC
81.0000 mg | DELAYED_RELEASE_TABLET | Freq: Every day | ORAL | Status: DC
  Administered 2021-12-04 – 2021-12-12 (×9): 81 mg via ORAL
  Filled 2021-12-03 (×9): qty 1

## 2021-12-03 MED ORDER — SODIUM CHLORIDE 0.9 % IV SOLN
1.0000 g | INTRAVENOUS | Status: DC
  Filled 2021-12-03: qty 10

## 2021-12-03 MED ORDER — HEPARIN SODIUM (PORCINE) 5000 UNIT/ML IJ SOLN
5000.0000 [IU] | Freq: Three times a day (TID) | INTRAMUSCULAR | Status: DC
  Administered 2021-12-03 – 2021-12-10 (×19): 5000 [IU] via SUBCUTANEOUS
  Filled 2021-12-03 (×21): qty 1

## 2021-12-03 MED ORDER — LACTATED RINGERS IV SOLN: INTRAVENOUS | Status: AC

## 2021-12-03 MED ORDER — METRONIDAZOLE 500 MG/100ML IV SOLN
500.0000 mg | Freq: Once | INTRAVENOUS | Status: AC
  Administered 2021-12-03: 500 mg via INTRAVENOUS
  Filled 2021-12-03: qty 100

## 2021-12-03 MED ORDER — PANTOPRAZOLE SODIUM 40 MG PO TBEC
40.0000 mg | DELAYED_RELEASE_TABLET | Freq: Two times a day (BID) | ORAL | Status: DC
Start: 2021-12-03 — End: 2021-12-12
  Administered 2021-12-03 – 2021-12-12 (×18): 40 mg via ORAL
  Filled 2021-12-03 (×18): qty 1

## 2021-12-03 MED ORDER — SODIUM CHLORIDE 0.9 % IV SOLN
2.0000 g | Freq: Once | INTRAVENOUS | Status: DC
  Filled 2021-12-03: qty 12.5

## 2021-12-03 MED ORDER — VANCOMYCIN HCL 1750 MG/350ML IV SOLN
1750.0000 mg | Freq: Once | INTRAVENOUS | Status: AC
  Administered 2021-12-03: 1750 mg via INTRAVENOUS
  Filled 2021-12-03: qty 350

## 2021-12-03 MED ORDER — LACTATED RINGERS IV BOLUS
1000.0000 mL | Freq: Once | INTRAVENOUS | Status: AC
  Administered 2021-12-03: 1000 mL via INTRAVENOUS

## 2021-12-03 MED ORDER — TAMSULOSIN HCL 0.4 MG PO CAPS
0.4000 mg | ORAL_CAPSULE | Freq: Every day | ORAL | Status: DC
  Administered 2021-12-03 – 2021-12-04 (×2): 0.4 mg via ORAL
  Filled 2021-12-03 (×2): qty 1

## 2021-12-03 MED ORDER — INSULIN ASPART 100 UNIT/ML IJ SOLN
0.0000 [IU] | Freq: Three times a day (TID) | INTRAMUSCULAR | Status: DC
  Administered 2021-12-04 – 2021-12-05 (×4): 1 [IU] via SUBCUTANEOUS
  Administered 2021-12-05: 2 [IU] via SUBCUTANEOUS
  Administered 2021-12-06 (×2): 1 [IU] via SUBCUTANEOUS
  Administered 2021-12-06 – 2021-12-08 (×2): 2 [IU] via SUBCUTANEOUS
  Administered 2021-12-09 – 2021-12-12 (×4): 1 [IU] via SUBCUTANEOUS

## 2021-12-03 MED ORDER — DOCUSATE SODIUM 100 MG PO CAPS: 100.0000 mg | ORAL_CAPSULE | Freq: Two times a day (BID) | ORAL | Status: DC | PRN

## 2021-12-03 MED ORDER — VANCOMYCIN HCL IN DEXTROSE 1-5 GM/200ML-% IV SOLN: 1000.0000 mg | Freq: Once | INTRAVENOUS | Status: DC

## 2021-12-03 MED ORDER — SODIUM CHLORIDE 0.9 % IV SOLN
250.0000 mL | INTRAVENOUS | Status: DC
  Administered 2021-12-03: 250 mL via INTRAVENOUS

## 2021-12-03 MED ORDER — CLOPIDOGREL BISULFATE 75 MG PO TABS
75.0000 mg | ORAL_TABLET | Freq: Every day | ORAL | Status: DC
  Administered 2021-12-04 – 2021-12-12 (×9): 75 mg via ORAL
  Filled 2021-12-03 (×10): qty 1

## 2021-12-03 NOTE — Progress Notes (Addendum)
eLink Physician-Brief Progress Note Patient Name: Kevin Soto DOB: [birth year] MRN: [id number]   Date of Service  12/03/2021  HPI/Events of Note  62 yr old man in ICU for septic shock. Had fever and loose stool coming in. Vanc, cefepime and flagyl have been given. Failed fluid resuscitation and is on low dose 2 mic/min levophed. Current MAP is 72 and he is in no distress on room air. HR in 90s.  eICU Interventions  Seen by CCM  Follow cultures LA normal at this time  Call E link if needed.      Intervention Category Major Interventions: Shock - evaluation and management Evaluation Type: New Patient Evaluation  Margaretmary Lombard 12/03/2021, 11:14 PM  Addendum at 11:15 pm Notified of mag - 2 gram IV and AM mag ordered Also notified that GI panel sent does not include C diff testing - order placed (HD patient, belly symptoms, shock and impressive leukocytosis)   Addendum at 2 am Notified that patient is on 10 mic/min levophed Seen on camera Asleep. RR 20. O2 98 . Room air. No distress D/w RN. Bps lower when asleep. When wakes up, not dizzy and no overt complaints Has not had any loose stool here  Current temp is 99  I think we can tolerate lower Bps in this case. Also took BP medications yesterday. Note from cardiology on 7/14 also noted BP was 90/60  Ok to maintain SBP 90 or MAP over 60 in this case since really no symptoms and lower Bps noted earlier and lactate also normal  D/w RN.

## 2021-12-03 NOTE — Progress Notes (Signed)
Pharmacy Antibiotic Note  Kevin Soto is a 61 y.o. male for which pharmacy has been consulted for cefepime and vancomycin dosing for sepsis.  Patient with a history of ESRD on HD, CVA, HLD, DM. Patient presenting with hypotension.   Last HD session 7/15 WBC 32.2; LA 2; HR 113; RR 19  Plan: Vancomycin 1750 mg once in ED subsequent dosing per HD schedule Metronidazole per MD Cefepime 1g q24hr Trend WBC, Fever, Renal function, & Clinical course F/u cultures, clinical course, WBC, fever De-escalate when able F/u Nephrology plan  Height: 6' (182.9 cm) Weight: 87 kg (191 lb 12.8 oz) IBW/kg (Calculated) : 77.6  Temp (24hrs), Avg:99.7 F (37.6 C), Min:98 F (36.7 C), Max:101.4 F (38.6 C)  Recent Labs  Lab 12/03/21 1352  WBC 32.2*  CREATININE 4.28*  LATICACIDVEN 2.0*    Estimated Creatinine Clearance: 19.4 mL/min (A) (by C-G formula based on SCr of 4.28 mg/dL (H)).    Allergies  Allergen Reactions   Clonidine Derivatives     Dizzy, too sleepy    Antimicrobials this admission: cefepime 7/16 >>  vancomycin 7/16 >>  metronidazole 7/16 >>   Microbiology results: Pending  Thank you for allowing pharmacy to be a part of this patient's care.  Lorelei Pont, PharmD, BCPS 12/03/2021 2:41 PM ED Clinical Pharmacist -  [phone]

## 2021-12-03 NOTE — Progress Notes (Signed)
An USGPIV (ultrasound guided PIV) has been placed for short-term vasopressor infusion. A correctly placed ivWatch must be used when administering Vasopressors. Should this treatment be needed beyond 72 hours, central line access should be obtained.  It will be the responsibility of the bedside nurse to follow best practice to prevent extravasations.   ?

## 2021-12-03 NOTE — H&P (Cosign Needed Addendum)
Date: 12/03/2021               Patient Name:  Kevin Soto MRN: [id number]  DOB: 09/25/1960 Age / Sex: 61 y.o., male   PCP: Romana Juniper, MD         Medical Service: Internal Medicine Teaching Service         Attending Physician: : Dr. Philipp Ovens    First Contact: Romana Juniper, MD      Pager: [facility] [phone]      Second Contact: [name], MD      Pager: Sandrea Hammond [phone]           After Hours (After 5p/  First Contact Pager: [phone]  weekends / holidays): Second Contact Pager: [phone]   SUBJECTIVE   Chief Complaint: weakness   History of Present Illness:   Kevin Soto is a 61 y.o. person living with a history of HTN, CAD, dilated cardiomyopathy, CVA, T2DM, ESRD on HD though an IJ tunneled catheter who presented with weakness  Patient does not remember why he came in. Primary caregiver is sister. Family states that he has been lethargic since yesterday. Patient was picked up from HD center, where he was getting blood work done, and noted to not be able to stand, pick up his feet, or sit up from bending over. Once he got home, he could not stand up straight. Needed more assistance than usual. He had no body strength. Did not get up out of bed this morning. Family had to help him up. Patient stated that he did not know what was wrong but something did not feel right. They took temperatures at home and did not have fever. Of note, patient was having diarrhea per family. His BMs have been dark and loose, and he has become incontinent (fecal). Family states that stools run down his legs. Otherwise, sister denies patient exposure to sick contacts, chills, pain, cough, or pain with urination.  At baseline,  he is not incontinent. He has difficulty with walking for the past 8 months since his stroke. Doing PT, able to get around a little with walker. Normally, takes him a few seconds but does answer and makes conversation. Seems to be okay with his memory at  baseline. Patient has been less interactive the past couple of days.  He formerly ate well, but has not been eating or drinking well for the past 2-3 days.  ED Course: Patient was found to have a 101.4 fever (rectal), hypotensive, with a WBC 32.2K, Cr. 2.4, Lactate of 2. Patient was started on IVF, collected blood cultures x2, TSH, hemo occult, and started on broad spectrum antibiotics. Patient's IJ tunneled catheter site with no over signs of erythema, fluctuance, or leakage.   Meds:  No outpatient medications have been marked as taking for the 12/03/21 encounter [facility]counter).    Past Medical History  Past Surgical History:  Procedure Laterality Date   BIOPSY  10/04/2021   Procedure: BIOPSY;  Surgeon: Lavena Bullion, DO;  Location: [facility];  Service: Gastroenterology;;   CORONARY ANGIOPLASTY WITH STENT PLACEMENT  2012   2 stents   ESOPHAGOGASTRODUODENOSCOPY (EGD) WITH PROPOFOL N/A 10/04/2021   Procedure: ESOPHAGOGASTRODUODENOSCOPY (EGD) WITH PROPOFOL;  Surgeon: Lavena Bullion, DO;  Location: [place];  Service: Gastroenterology;  Laterality: N/A;   IR FLUORO GUIDE CV LINE RIGHT  09/29/2021   IR US GUIDE VASC ACCESS RIGHT  09/29/2021   RADIOACTIVE SEED IMPLANT N/A 05/08/2017   Procedure: RADIOACTIVE SEED IMPLANT/BRACHYTHERAPY  IMPLANT;  Surgeon: Alexis Frock, MD;  Location: [facility];  Service: Urology;  Laterality: N/A;   REFRACTIVE SURGERY  2012   right and left eye/ per pt only left eye   SPACE OAR INSTILLATION N/A 05/08/2017   Procedure: SPACE OAR INSTILLATION;  Surgeon: Alexis Frock, MD;  Location: [facility];  Service: Urology;  Laterality: N/A;    Social:  Lives With: Sister Occupation: unable to obtain Support: Family members  Level of Function: walks with a cane PCP: Romana Juniper, MD Substances: Unable to obtain information  Family History: Defer to when sister comes in. Patient unable to  provide.  Allergies: Allergies as of 12/03/2021 - Review Complete 12/01/2021  Allergen Reaction Noted   Clonidine derivatives  12/15/2010    Review of Systems: A complete ROS was negative except as per HPI.   OBJECTIVE:   Physical Exam: Blood pressure 90/66, pulse (!) 113, temperature (!) 101.4 F (38.6 C), temperature source Rectal, resp. rate 19, height 6' (1.829 m), weight 87 kg, SpO2 (!) 87 %.  Constitutional: ill-appearing man sitting in bed, in no acute distress HENT: normocephalic atraumatic, mucous membranes moist. Vision loss L eye. Cardiovascular: regular rate and rhythm, no m/r/g, no JVD Pulmonary/Chest: normal work of breathing on room air, lungs clear to auscultation bilaterally. No crackles  Abdominal:hypoactive bowel sounds soft, non-tender, non-distended. nofluid wave. Very loose, runny, brown incontinent bowel on exam Neurological: Patient alert and oriented to self, place, and year, but not to month.   MSK: no gross abnormalities. No pitting edema Skin: warm and dry, no open wounds observed.IJ tunneled catheter in place in the R upper chest without erythema, swelling, with clean bandages. Psych: Normal mood and affect  Labs: CBC    Component Value Date/Time   WBC 32.2 (H) 12/03/2021 1352   RBC 3.57 (L) 12/03/2021 1352   HGB 10.4 (L) 12/03/2021 1352   HGB 9.3 (L) 10/18/2021 1650   HGB 10.1 (L) 06/25/2013 1427   HCT 31.4 (L) 12/03/2021 1352   HCT 28.2 (L) 10/18/2021 1650   HCT 31.1 (L) 06/25/2013 1427   PLT 276 12/03/2021 1352   PLT 327 10/18/2021 1650   MCV 88.0 12/03/2021 1352   MCV 86 10/18/2021 1650   MCV 87.6 06/25/2013 1427   MCH 29.1 12/03/2021 1352   MCHC 33.1 12/03/2021 1352   RDW 14.8 12/03/2021 1352   RDW 14.9 10/18/2021 1650   RDW 14.5 06/25/2013 1427   LYMPHSABS 1.0 12/03/2021 1352   LYMPHSABS 2.1 05/08/2016 1457   LYMPHSABS 2.6 06/25/2013 1427   MONOABS 2.7 (H) 12/03/2021 1352   MONOABS 1.0 (H) 06/25/2013 1427   EOSABS 0.0 12/03/2021  1352   EOSABS 0.3 05/08/2016 1457   BASOSABS 0.1 12/03/2021 1352   BASOSABS 0.0 05/08/2016 1457   BASOSABS 0.1 06/25/2013 1427     CMP     Component Value Date/Time   NA 135 12/03/2021 1352   NA 141 10/18/2021 1650   NA 144 06/25/2013 1427   K 3.3 (L) 12/03/2021 1352   K 4.5 06/25/2013 1427   CL 95 (L) 12/03/2021 1352   CL 105 08/07/2012 1059   CO2 24 12/03/2021 1352   CO2 28 06/25/2013 1427   GLUCOSE 219 (H) 12/03/2021 1352   GLUCOSE 131 06/25/2013 1427   GLUCOSE 261 (H) 08/07/2012 1059   BUN 29 (H) 12/03/2021 1352   BUN 17 10/18/2021 1650   BUN 21.3 06/25/2013 1427   CREATININE 4.28 (H) 12/03/2021 1352  CREATININE 1.81 (H) 05/02/2015 1203   CREATININE 1.7 (H) 06/25/2013 1427   CALCIUM 8.8 (L) 12/03/2021 1352   CALCIUM 9.8 06/25/2013 1427   PROT 7.2 12/03/2021 1352   PROT 7.1 10/25/2016 1115   PROT 7.4 06/25/2013 1427   ALBUMIN 3.0 (L) 12/03/2021 1352   ALBUMIN 3.8 06/25/2013 1427   AST 19 12/03/2021 1352   AST 20 06/25/2013 1427   ALT 14 12/03/2021 1352   ALT 40 06/25/2013 1427   ALKPHOS 72 12/03/2021 1352   ALKPHOS 63 06/25/2013 1427   BILITOT 1.6 (H) 12/03/2021 1352   BILITOT 0.28 06/25/2013 1427   GFRNONAA 15 (L) 12/03/2021 1352   GFRNONAA 43 (L) 08/17/2014 1532   GFRAA 45 (L) 10/26/2019 1535   GFRAA 50 (L) 08/17/2014 1532    Imaging: DG Chest Port 1 View  Result Date: 12/03/2021 CLINICAL DATA:  Weakness EXAM: PORTABLE CHEST 1 VIEW COMPARISON:  09/27/2021 FINDINGS: Interval placement of right IJ approach hemodialysis catheter with distal tip terminating at the level of the right atrium. The heart size and mediastinal contours are within normal limits. Low lung volumes. No focal airspace consolidation, pleural effusion, or pneumothorax. The visualized skeletal structures are unremarkable. IMPRESSION: Low lung volumes without acute cardiopulmonary findings. Electronically Signed   By: Davina Poke D.O.   On: 12/03/2021 14:09      EKG: personally reviewed  my interpretation issinus tachycardia.   ASSESSMENT & PLAN:   Assessment & Plan by Problem: Principal Problem:   Sepsis ([place]) Active Problems:   Diabetes mellitus type 2 with neurological manifestations ([place])   Hyperlipidemia due to type 2 diabetes mellitus ([place])   CVA (cerebral vascular accident) ([place])   ESRD (end stage renal disease) on dialysis ([facility])   Deveon Kisiel Guderian is a 61 y.o. person living with a history of HTN, CAD, dilated cardiomyopathy, CVA, T2DM, ESRD on HD though an IJ tunneled catheter who presented with weakness and admitted for fever, leukocytosis, and hypotension concerning for sepsis on hospital day 0  Sepsis Fever Leukocytosis Hypotension Patient with recent history of loose, incontinent, dark brown stools with poor PO intake now with hypotension, weakness, and decreased interaction per family. Patient meets sepsis criteria with HR ~113, SBP 90-109, and Tmax 101.4, and lactate of 2. HR could also be explained by 3-4 days of loose stools with poor PO intake. Patient noted to be hypotensive in cardiology clinic on 7/14 with a BPs ranging 90/60, requiring reduced antihypertensive dosing. It is possible this constellation of symptoms could be explained by sepsis 2/2 to acute gastroenteritis  vs an unknown source given significant leukocytosis (32.2k) and lactate of 2. Physical exam did not reveal open skin sources, abnormal breath sounds, tenderness to palpation of abdomen and suprapubic area. CXR was unremarkable. U/A, pending. Patient was started on Vancomycin and cefepime prior to blood culture collection in the ED, question utility of result. -TSH, pending -FOBT, negative - Blood cultures, pending -Vancomycin/cefepime today -Trending lactic plasma -U/A and culture pending -Bladder scan r/o retention -GI path panel -Enteric precautions  AKI ESRD on  HD Diabetic nephropathy Recent admission for ischemic ATN 2/2 ACEi and diuretics GFR 15 Previous baseline Cr.  1.6-2. Kidney biopsy with moderate diabetes changes, 40% interstitial fibrosis, and low possibility of renal recovery. Tunneled IJ catheter (placed on 5/12) and first HD 5/13 initially started for AKI for recent 09/2021 admission. HD now on T, Th, S since 09/2021. Patient makes some urine. Cr. 4.28 today - Consult nephrology for inpatient HD, T,Th,S -Administer  IVF and monitor fluid status  Hypokalemia Potassium on admission 3.3. Patient with hypokalemia since last hospitalization treated with with potassium bath during HD. -Continue to monitor -Trend BMP - Hemodialysis on Tuesday - Appreciate nephrology recommendations  Microcytic Anemia Anemia of chronic disease Hbg 10.4 - Patient started on ESA - Low threshold to start iron supplementation  HTN Lisinopril stopped during last hospital admission 2/2 AKI. Coreg lowered during outpatient visit from '25mg'$  to 12.5 BID. Will hold coreg secondary to hypotension during this  -Hold home Coreg 12.5 BID -ASA -Plavix  DM Last A1C 7.3%. Jardiance and metformin discharged during last hospitalization 2/2 acute renal failure. - Trulicy 1.5 mg weekly - Start Sensitive SSI  Dilated cardiomyopathy Echo 2015 55%. Patient currently on Coreg. HCTZ, Jardiance, lisinopril discharged during last hospitalization. -Holding Coreg  CAD Cath 8/12: mLAD occluded with trivial collats from right AM and dRCA, mDx (small) 80%, RI 25%, tiny branch of OM 90%, mAM 30-40%, RCA sub-totally occluded, then 80%, EF 40%, inf HK.  He underwent PCI with placement of a Promus DES to the mRCA x 2.Statin discontinued by nephrology 2/2 myoglobin casts during past hospitalization concerning for muscle toxicity.  -Continue on ASA -Continue on Plavix  HLD LDL not at target but statin discontinued in the setting of myoglobin casts concerning for muscle toxicity. Possibility of starting him on PCSK9 inhibitor in the outpatient setting.  Prostate cancer BPH S/p seed  implants -Continue on Flomax  CVA Per MRI ( 11/2010) Remote infarcts without evidence of an acute infarct . White matter type changes probably related to result of small vessel disease in this setting.  Poor delineation left vertebral artery.Bilateral remote cerebellar infarcts.  Remote right thalamic infarcts larger on the right.  Remote small infarct posterior right  corona radiata. Patient walks with cane. Will reassess his neuro exam as patient was generally weak. He was moving upper extremities against gravity and was able to sit up without assistance in bed - Continues with ASA -Continue Plavix.   GERD Continue on pantoprazole  Diet: Carb/Renal VTE: Heparin Code: Full  Prior to Admission Living Arrangement:  home with sister. Anticipated Discharge Location: pending PT/OT evaluation Barriers to Discharge: Clinical improvement  Dispo: Admit patient to Observation with expected length of stay less than 2 midnights.  Signed:   Romana Juniper, MD Internal Medicine Resident PGY-1 12/03/2021, 7:22 PM

## 2021-12-03 NOTE — Consult Note (Signed)
NAME:  Kevin Soto, MRN:  [id number], DOB:  [birth year], LOS: 0 ADMISSION DATE:  12/03/2021, CONSULTATION DATE:  12/03/2021 REFERRING MD:  Dr. Philipp Ovens, CHIEF COMPLAINT:  Hypotension   History of Present Illness:  Patient is a 61 year old male with pertinent PMH HTN, CAD, ESRD (HD T, TH, sat), dilated cardiomyopathy, CVA, T2DM presents to [facility] ED on 7/16 with weakness.  Patient's primary caregiver is Sister.  Family states that he has been more lethargic since getting dialysis on 7/15.  He had limited mobility and trouble with balance, needing more assistance than normal.  Of note, patient has been having difficulty walking over the past 8 months since his stroke and is working with PT.  Family states he has had no fever but has been having diarrhea.  Patient was not sure but feels like dialysis might have pulled more off on Saturday and has had decreased appetite.  Upon arrival to [facility] ED on 7/16, patient febrile 101.4 F and mildly hypotensive.  Sinus tach 110s.  On room air.  LA 2.  WBC 32.  Hgb 10.4.  K3.3, glucose 219, BUN 29, creat 4.28.  CXR with no acute findings.  Patient given IV fluids.  Culture sent.  Started on cefepime and vanc.  Patient's tunneled HD cath without erythema or drainage.  Despite IV fluids patient still borderline hypotensive.  PCCM consulted for ICU admission.  Pertinent  Medical History   Past Medical History:  Diagnosis Date   Anemia    CAD (coronary artery disease)    cath 8/12:  LM ok, mLAD occluded with trivial collats from right AM and dRCA, mDx (small) 80%, RI 25%, tiny branch of OM 90%, mAM 30-40%, RCA sub-totally occluded, then 80%, EF 40%, inf HK.  He underwent PCI with [name] with placement of a Promus DES to the mRCA x 2   Chronic kidney disease    stage 3 per chart   Diabetes mellitus    Dyslipidemia    ED (erectile dysfunction)    Gait instability 02/27/2011   History of alcohol abuse    History of stomach ulcers    History of stroke     evidence of CVA on MRI in past   Hyperlipidemia    Hypertension    Ischemic cardiomyopathy    echo 7/12: EF 40-45%, septal, apical, inf basal HK, mod LVH, mild MR, mild LAE.  EF 55% by echo 2015   Macular hemorrhage    Prostate cancer ([place])    Sleep apnea    no c-pap; wife denies   Vision, loss, sudden 02/27/2011     Significant Hospital Events: Including procedures, antibiotic start and stop dates in addition to other pertinent events   7/16: admitted to [facility] with hypotension; sepsis  Interim History / Subjective:  See above  Objective   Blood pressure (!) 94/53, pulse (!) 108, temperature 98.5 F (36.9 C), temperature source Oral, resp. rate (!) 21, height 6' (1.829 m), weight 87 kg, SpO2 96 %.        Intake/Output Summary (Last 24 hours) at 12/03/2021 2201 Last data filed at 12/03/2021 1708 Gross per 24 hour  Intake 594.24 ml  Output --  Net 594.24 ml   Filed Weights   12/03/21 1326  Weight: 87 kg    Examination: General:  ill appearing male in NAD HEENT: MM pink/moist Neuro: Aox3; MAE CV: s1s2, no m/r/g PULM:  dim clear BS bilaterally GI: soft, bsx4 active; no pain upon palpation Extremities: warm/dry, no  edema; Right HD cath in place without erythema or drainage Skin: no rashes or lesions appreciated   Resolved Hospital Problem list     Assessment & Plan:  Sepsis Hypotension P: -Will admit to ICU -IV fluids given -Consider levo if MAP remains less than 65 -Trend WBC/fever curve -Continue cefepime/Vanc -Follow BC x2, UA, UC -Trend LA  Diarrhea? P: -f/u GI panel  ESRD: on HD tues, thurs, sat Mild hypokalemia P: -Nephro consulted -Trend BMP / urinary output -Replace electrolytes as indicated -Avoid nephrotoxic agents, ensure adequate renal perfusion  Hyperglycemia DMT2 P: -SSI and CBG monitoring  Dilated cardiomyopathy: Echo thousand 15 LVEF 55% HTN CAD HLD : no home Statin for concern myoglobin cast and possible muscle  toxicity P: -Hold home antihypertensives -Continue ASA and Plavix  Prostate cancer: S/p seed implant BPH P: -Flomax -Trend UOP  CVA P: -ASA and Plavix  GERD P: -PPI    Best Practice (right click and "Reselect all SmartList Selections" daily)   Diet/type: Regular consistency (see orders) DVT prophylaxis: prophylactic heparin  GI prophylaxis: N/A Lines: Dialysis Catheter Foley:  N/A Code Status:  full code Last date of multidisciplinary goals of care discussion [7/16 spoke and updated patient at bedside]  Labs   CBC: Recent Labs  Lab 12/03/21 1352  WBC 32.2*  NEUTROABS 27.9*  HGB 10.4*  HCT 31.4*  MCV 88.0  PLT 161    Basic Metabolic Panel: Recent Labs  Lab 12/03/21 1352  NA 135  K 3.3*  CL 95*  CO2 24  GLUCOSE 219*  BUN 29*  CREATININE 4.28*  CALCIUM 8.8*   GFR: Estimated Creatinine Clearance: 19.4 mL/min (A) (by C-G formula based on SCr of 4.28 mg/dL (H)). Recent Labs  Lab 12/03/21 1352 12/03/21 1704  WBC 32.2*  --   LATICACIDVEN 2.0* 2.0*    Liver Function Tests: Recent Labs  Lab 12/03/21 1352  AST 19  ALT 14  ALKPHOS 72  BILITOT 1.6*  PROT 7.2  ALBUMIN 3.0*   No results for input(s): "LIPASE", "AMYLASE" in the last 168 hours. No results for input(s): "AMMONIA" in the last 168 hours.  ABG No results found for: "PHART", "PCO2ART", "PO2ART", "HCO3", "TCO2", "ACIDBASEDEF", "O2SAT"   Coagulation Profile: No results for input(s): "INR", "PROTIME" in the last 168 hours.  Cardiac Enzymes: No results for input(s): "CKTOTAL", "CKMB", "CKMBINDEX", "TROPONINI" in the last 168 hours.  HbA1C: Hemoglobin A1C  Date/Time Value Ref Range Status  07/14/2021 09:59 AM 7.2 (A) 4.0 - 5.6 % Final  07/29/2020 12:10 PM 6.5 (A) 4.0 - 5.6 % Final   Hgb A1c MFr Bld  Date/Time Value Ref Range Status  09/28/2021 02:36 AM 7.3 (H) 4.8 - 5.6 % Final    Comment:    (NOTE) Pre diabetes:          5.7%-6.4%  Diabetes:              >6.4%  Glycemic  control for   <7.0% adults with diabetes   02/27/2011 05:23 PM 8.7 (H) <5.7 % Final    Comment:    (NOTE)                                                                       According to  the ADA Clinical Practice Recommendations for 2011, when HbA1c is used as a screening test:  >=6.5%   Diagnostic of Diabetes Mellitus           (if abnormal result is confirmed) 5.7-6.4%   Increased risk of developing Diabetes Mellitus References:Diagnosis and Classification of Diabetes Mellitus,Diabetes IOEV,0350,09(FGHWE 1):S62-S69 and Standards of Medical Care in         Diabetes - 2011,Diabetes XHBZ,1696,78 (Suppl 1):S11-S61.    CBG: Recent Labs  Lab 12/03/21 2138  GLUCAP 160*    Review of Systems:   Review of Systems  Constitutional:  Negative for fever.  Respiratory:  Negative for sputum production, shortness of breath and wheezing.   Cardiovascular:  Negative for chest pain and leg swelling.  Gastrointestinal:  Negative for nausea and vomiting.  Genitourinary:  Negative for dysuria and urgency.     Past Medical History:  He,  has a past medical history of Anemia, CAD (coronary artery disease), Chronic kidney disease, Diabetes mellitus, Dyslipidemia, ED (erectile dysfunction), Gait instability (02/27/2011), History of alcohol abuse, History of stomach ulcers, History of stroke, Hyperlipidemia, Hypertension, Ischemic cardiomyopathy, Macular hemorrhage, Prostate cancer ([place]), Sleep apnea, and Vision, loss, sudden (02/27/2011).   Surgical History:   Past Surgical History:  Procedure Laterality Date   BIOPSY  10/04/2021   Procedure: BIOPSY;  Surgeon: Lavena Bullion, DO;  Location: [facility];  Service: Gastroenterology;;   CORONARY ANGIOPLASTY WITH STENT PLACEMENT  2012   2 stents   ESOPHAGOGASTRODUODENOSCOPY (EGD) WITH PROPOFOL N/A 10/04/2021   Procedure: ESOPHAGOGASTRODUODENOSCOPY (EGD) WITH PROPOFOL;  Surgeon: Lavena Bullion, DO;  Location: [facility];  Service:  Gastroenterology;  Laterality: N/A;   IR FLUORO GUIDE CV LINE RIGHT  09/29/2021   IR US GUIDE VASC ACCESS RIGHT  09/29/2021   RADIOACTIVE SEED IMPLANT N/A 05/08/2017   Procedure: RADIOACTIVE SEED IMPLANT/BRACHYTHERAPY IMPLANT;  Surgeon: Alexis Frock, MD;  Location: [facility];  Service: Urology;  Laterality: N/A;   REFRACTIVE SURGERY  2012   right and left eye/ per pt only left eye   SPACE OAR INSTILLATION N/A 05/08/2017   Procedure: SPACE OAR INSTILLATION;  Surgeon: Alexis Frock, MD;  Location: [facility];  Service: Urology;  Laterality: N/A;     Social History:   reports that he has never smoked. He has never used smokeless tobacco. He reports current alcohol use of about 4.0 standard drinks of alcohol per week. He reports that he does not use drugs.   Family History:  His family history includes Cancer in his father; Crohn's disease in his sister and another family member; Diabetes in his father and mother; Heart attack in his father and mother; Heart disease in his sister; Heart failure in his father and mother; Kidney disease in his brother; Prostate cancer in his father. He was adopted.   Allergies Allergies  Allergen Reactions   Clonidine Derivatives     Dizzy, too sleepy     Home Medications  Prior to Admission medications   Medication Sig Start Date End Date Taking? Authorizing Provider  Accu-Chek FastClix Lancets MISC Use to check blood sugar up to 7 times a week 10/18/21   Iona Beard, MD  acetaminophen (TYLENOL) 325 MG tablet Take 2 tablets (650 mg total) by mouth every 6 (six) hours as needed for mild pain (or Fever >/= 101). 10/05/21   Lajean Manes, MD  aspirin EC 81 MG tablet Take 1 tablet (81 mg total) by mouth daily. 06/08/20   Harvie Heck, MD  Blood Glucose Monitoring Suppl (ONETOUCH VERIO REFLECT) w/Device KIT Check blood sugar 1 time a day 10/18/21   Iona Beard, MD  carvedilol (COREG) 25 MG tablet Take 1 tablet (25 mg total)  by mouth 2 (two) times daily with a meal. 10/05/21   Lajean Manes, MD  clopidogrel (PLAVIX) 75 MG tablet Take 1 tablet (75 mg total) by mouth daily. 06/14/21   Rehman, Areeg N, DO  dorzolamide-timolol (COSOPT) 22.3-6.8 MG/ML ophthalmic solution Place 1 drop into both eyes daily. 03/25/20   [provider]  Dulaglutide (TRULICITY) 3 TK/1.6WF SOPN Inject 3 mg as directed once a week. 11/08/21   Lajean Manes, MD  Empagliflozin-metFORMIN HCl ER (SYNJARDY XR) 09-998 MG TB24 Take 1 tablet by mouth daily. Take 1 tablet by mouth twice a day.    [provider]  fluticasone (FLONASE) 50 MCG/ACT nasal spray Place 2 sprays into both nostrils daily. 06/08/20   Harvie Heck, MD  glucose blood (ONETOUCH VERIO) test strip Check blood sugar 1 time a day 11/08/21   Lajean Manes, MD  hydrochlorothiazide (HYDRODIURIL) 25 MG tablet Take 25 mg by mouth daily.    [provider]  Lancets [facility] DELICA PLUS [id number]) MISC Check blood sugar 1 time a day 09/23/20   Riesa Pope, MD  lisinopril (ZESTRIL) 20 MG tablet Take 20 mg by mouth daily.    [provider]  nitroGLYCERIN (NITROSTAT) 0.4 MG SL tablet Place 1 tablet (0.4 mg total) under the tongue every 5 (five) minutes as needed. Patient taking differently: Place 0.4 mg under the tongue every 5 (five) minutes as needed for chest pain. 06/08/20   Harvie Heck, MD  pantoprazole (PROTONIX) 40 MG tablet Take 1 tablet (40 mg total) by mouth 2 (two) times daily. 10/05/21   Lajean Manes, MD  senna-docusate (SENOKOT-S) 8.6-50 MG tablet Take 1 tablet by mouth at bedtime as needed for mild constipation. 10/05/21   Lajean Manes, MD  tamsulosin (FLOMAX) 0.4 MG CAPS capsule Take 1 capsule (0.4 mg total) by mouth daily. 06/14/21   Rehman, Areeg N, DO  Vitamin D, Ergocalciferol, (DRISDOL) 1.25 MG (50000 UNIT) CAPS capsule Take 50,000 Units by mouth every 7 (seven) days.    [provider]     Critical care time: 45 minutes    JD Rexene Agent [facility] 12/03/2021, 10:01 PM  Please see [email or web address] for pager details.  From 7A-7P if no response, please call [phone]. After hours, please call ELink [phone].

## 2021-12-03 NOTE — ED Notes (Signed)
Notified Dr. Jeanell Sparrow of rectal temp of 101.4, lactic of 2.0 and WBC of 32.

## 2021-12-03 NOTE — ED Provider Notes (Signed)
[facility] EMERGENCY DEPARTMENT Provider Note   CSN: [id number] Arrival date & time: 12/03/21  1312     History  Chief Complaint  Patient presents with   Hypotension    Kevin Soto is a 61 y.o. male.  HPI 61 year old male end-stage renal disease, on dialysis, history of CVA, related cardiomyopathy presents today with hypotension and generalized weakness.  Patient has had generalized weakness and not "feeling well "over the past several days.  He had dialysis yesterday.  He feels that they took off too much fluid and he has had some low blood pressure since that time.  Does not endorse chest pain, cough, abdominal pain, UTI symptoms.  He does continue to make urine.     Home Medications Prior to Admission medications   Medication Sig Start Date End Date Taking? Authorizing Provider  Accu-Chek FastClix Lancets MISC Use to check blood sugar up to 7 times a week 10/18/21   Iona Beard, MD  acetaminophen (TYLENOL) 325 MG tablet Take 2 tablets (650 mg total) by mouth every 6 (six) hours as needed for mild pain (or Fever >/= 101). 10/05/21   Lajean Manes, MD  aspirin EC 81 MG tablet Take 1 tablet (81 mg total) by mouth daily. 06/08/20   Harvie Heck, MD  Blood Glucose Monitoring Suppl (ONETOUCH VERIO REFLECT) w/Device KIT Check blood sugar 1 time a day 10/18/21   Iona Beard, MD  carvedilol (COREG) 25 MG tablet Take 1 tablet (25 mg total) by mouth 2 (two) times daily with a meal. 10/05/21   Lajean Manes, MD  clopidogrel (PLAVIX) 75 MG tablet Take 1 tablet (75 mg total) by mouth daily. 06/14/21   Rehman, Areeg N, DO  dorzolamide-timolol (COSOPT) 22.3-6.8 MG/ML ophthalmic solution Place 1 drop into both eyes daily. 03/25/20   [provider]  Dulaglutide (TRULICITY) 3 BC/4.8GQ SOPN Inject 3 mg as directed once a week. 11/08/21   Lajean Manes, MD  Empagliflozin-metFORMIN HCl ER (SYNJARDY XR) 09-998 MG TB24 Take 5-1,000 mg by mouth. Take 1 tablet by mouth twice a  day.    [provider]  fluticasone (FLONASE) 50 MCG/ACT nasal spray Place 2 sprays into both nostrils daily. 06/08/20   Harvie Heck, MD  glucose blood (ONETOUCH VERIO) test strip Check blood sugar 1 time a day 11/08/21   Lajean Manes, MD  hydrochlorothiazide (HYDRODIURIL) 25 MG tablet Take 25 mg by mouth daily. Take 1 tablet by mouth daily    [provider]  Lancets [facility] DELICA PLUS [id number]) MISC Check blood sugar 1 time a day 09/23/20   Riesa Pope, MD  lisinopril (ZESTRIL) 20 MG tablet Take 20 mg by mouth daily. Take 1 tablet by mouth daily .    [provider]  nitroGLYCERIN (NITROSTAT) 0.4 MG SL tablet Place 1 tablet (0.4 mg total) under the tongue every 5 (five) minutes as needed. 06/08/20   Harvie Heck, MD  pantoprazole (PROTONIX) 40 MG tablet Take 1 tablet (40 mg total) by mouth 2 (two) times daily. 10/05/21   Lajean Manes, MD  senna-docusate (SENOKOT-S) 8.6-50 MG tablet Take 1 tablet by mouth at bedtime as needed for mild constipation. 10/05/21   Lajean Manes, MD  tamsulosin (FLOMAX) 0.4 MG CAPS capsule Take 1 capsule (0.4 mg total) by mouth daily. 06/14/21   Rehman, Areeg N, DO  Vitamin D, Ergocalciferol, (DRISDOL) 1.25 MG (50000 UNIT) CAPS capsule Take 50,000 Units by mouth every 7 (seven) days.    [provider]  Allergies    Clonidine derivatives    Review of Systems   Review of Systems  Physical Exam Updated Vital Signs BP 90/66   Pulse (!) 113   Temp (!) 101.4 F (38.6 C) (Rectal)   Resp 19   Ht 1.829 m (6')   Wt 87 kg   SpO2 (!) 87%   BMI 26.01 kg/m  Physical Exam Vitals and nursing note reviewed.  Constitutional:      General: He is not in acute distress.    Comments:  blood pressure 85/52 Heart rate 115  HENT:     Head: Normocephalic and atraumatic.     Right Ear: External ear normal.     Left Ear: External ear normal.     Nose: Nose normal.     Mouth/Throat:     Pharynx: Oropharynx is clear.  Eyes:      Extraocular Movements: Extraocular movements intact.  Cardiovascular:     Rate and Rhythm: Regular rhythm. Tachycardia present.     Pulses: Normal pulses.  Pulmonary:     Effort: Pulmonary effort is normal.     Breath sounds: Normal breath sounds.     Comments: Right-sided chest dialysis catheter, no redness, warmth, fluctuance, or tenderness Abdominal:     General: Abdomen is flat. Bowel sounds are normal.     Palpations: Abdomen is soft.  Musculoskeletal:        General: Normal range of motion.     Cervical back: Normal range of motion.  Skin:    General: Skin is warm and dry.     Capillary Refill: Capillary refill takes less than 2 seconds.  Neurological:     General: No focal deficit present.     Mental Status: He is alert.  Psychiatric:        Mood and Affect: Mood normal.     ED Results / Procedures / Treatments   Labs (all labs ordered are listed, but only abnormal results are displayed) Labs Reviewed  CBC WITH DIFFERENTIAL/PLATELET - Abnormal; Notable for the following components:      Result Value   WBC 32.2 (*)    RBC 3.57 (*)    Hemoglobin 10.4 (*)    HCT 31.4 (*)    Neutro Abs 27.9 (*)    Monocytes Absolute 2.7 (*)    Abs Immature Granulocytes 0.53 (*)    All other components within normal limits  COMPREHENSIVE METABOLIC PANEL - Abnormal; Notable for the following components:   Potassium 3.3 (*)    Chloride 95 (*)    Glucose, Bld 219 (*)    BUN 29 (*)    Creatinine, Ser 4.28 (*)    Calcium 8.8 (*)    Albumin 3.0 (*)    Total Bilirubin 1.6 (*)    GFR, Estimated 15 (*)    Anion gap 16 (*)    All other components within normal limits  LACTIC ACID, PLASMA - Abnormal; Notable for the following components:   Lactic Acid, Venous 2.0 (*)    All other components within normal limits  CULTURE, BLOOD (SINGLE)  CULTURE, BLOOD (ROUTINE X 2)  CULTURE, BLOOD (ROUTINE X 2)  URINALYSIS, ROUTINE W REFLEX MICROSCOPIC  LACTIC ACID, PLASMA    EKG EKG  Interpretation  Date/Time:  Sunday December 03 2021 13:28:40 EDT Ventricular Rate:  114 PR Interval:  163 QRS Duration: 97 QT Interval:  314 QTC Calculation: 433 R Axis:   78 Text Interpretation: Sinus tachycardia Low voltage, extremity leads SINCE LAST TRACING  HEART RATE HAS INCREASED since last tracing of 27 Sep 2021 Confirmed by Pattricia Boss [id number]) on 12/03/2021 1:31:05 PM  Radiology DG Chest Port 1 View  Result Date: 12/03/2021 CLINICAL DATA:  Weakness EXAM: PORTABLE CHEST 1 VIEW COMPARISON:  09/27/2021 FINDINGS: Interval placement of right IJ approach hemodialysis catheter with distal tip terminating at the level of the right atrium. The heart size and mediastinal contours are within normal limits. Low lung volumes. No focal airspace consolidation, pleural effusion, or pneumothorax. The visualized skeletal structures are unremarkable. IMPRESSION: Low lung volumes without acute cardiopulmonary findings. Electronically Signed   By: Davina Poke D.O.   On: 12/03/2021 14:09    Procedures .Critical Care  Performed by: Pattricia Boss, MD Authorized by: Pattricia Boss, MD   Critical care provider statement:    Critical care time (minutes):  45   Critical care was time spent personally by me on the following activities:  Development of treatment plan with patient or surrogate, discussions with consultants, evaluation of patient's response to treatment, examination of patient, ordering and review of laboratory studies, ordering and review of radiographic studies, ordering and performing treatments and interventions, pulse oximetry, re-evaluation of patient's condition and review of old charts     Medications Ordered in ED Medications  lactated ringers infusion (has no administration in time range)  ceFEPIme (MAXIPIME) 2 g in sodium chloride 0.9 % 100 mL IVPB (has no administration in time range)  metroNIDAZOLE (FLAGYL) IVPB 500 mg (has no administration in time range)  vancomycin  (VANCOREADY) IVPB 1750 mg/350 mL (has no administration in time range)  lactated ringers bolus 500 mL (0 mLs Intravenous Stopped 12/03/21 1431)    ED Course/ Medical Decision Making/ A&P Clinical Course as of 12/03/21 1520  Sun Dec 03, 2021  1436 CBC reviewed interpreted and white count is 32,000 [DR]  1436 Chest x-Brinda Focht reviewed interpreted no evidence of acute infiltrate [DR]    Clinical Course User Index [DR] Pattricia Boss, MD                           Medical Decision Making 61 year old man end-stage renal disease on dialysis presents today complaining of low blood pressures and diffuse weakness and not feeling well. After my initial evaluation rectal temperature was obtained and is 1-1.4 CBC returned with leukocytosis of 37,000 Blood pressure 90/66 and lactic acid and blood cultures have been ordered patient given fluid bolus of 500 cc Broad-spectrum antibiotics have been initiated 1-febrile illness/sepsis-patient febrile here with no definitive source of infection although urine pending.  Patient given 500 cc lr , will add additional 500 cc.  Initial lactic 2 and will be trended Right chest with dialysis catheter- no redness or warmth- would consider d/c if no other source found, but would need new access 2- leukocytosis 32,000 likely secondary to #1 3- esrd on dialysis 4- stable anemia of chronic disease   Amount and/or Complexity of Data Reviewed Labs: ordered. Decision-making details documented in ED Course. Radiology: ordered and independent interpretation performed. Decision-making details documented in ED Course. ECG/medicine tests: ordered and independent interpretation performed. Decision-making details documented in ED Course. Discussion of management or test interpretation with external provider(s): Discussed with IM teaching service and will see for admission  Risk Prescription drug management. Decision regarding hospitalization. Diagnosis or treatment significantly  limited by social determinants of health.           Final Clinical Impression(s) / ED Diagnoses Final diagnoses:  Sepsis  without acute organ dysfunction, due to unspecified organism [facility])    Rx / DC Orders ED Discharge Orders     None         Pattricia Boss, MD 12/03/21 1520

## 2021-12-03 NOTE — Progress Notes (Signed)
Elink following for sepsis protocol. 

## 2021-12-03 NOTE — Progress Notes (Addendum)
Patient noted to have MAP <65 s/p ~2L IVFs.  Seen and evaluated at bedside. No complaints, states he feels the same as prior. No pain, no orthopnea.   PE:  AAOX3, NAD. Cardiac: tachycardic, no murmur Lungs CTAB, breahting comfortably Abd, soft, nt, +bs Extemities: moves all extemities, no LEE   A/P: Given patient's continued hypotension with SBP 70-80 and tachycardia 100-110 despite receiving 2L IVFs, there is concern that he is not responding to IVFs would need vasopressor support. Appeared euvolemic on exam. Additionally, given his history of ESRD, there is concern that with additional IVF adminstration that he could easily become fluid overloaded and decompensate.  PCCM was asked to evaluate for ICU admission and possible vasopressor support. They have agreed to monitor him in ICU overnight. We will remain primary and resume care in AM if he remains stable overnight. Appreciate their assistance.

## 2021-12-03 NOTE — ED Triage Notes (Signed)
Pt BIB [facility] from home  w/ complaints of lower BP over the last few days along with no appetite since dialysis yesterday. Patient feels that at the dialysis center they took off more volume than they usually do during his sessions yesterday. Pt with no appetite and unable to eat/ drink since yesterday. Pt is a Tue/ Thurs/ Sat dialysis patient.  VS: 88/62 HR 114  CBG 225  Spo2-98%

## 2021-12-04 DIAGNOSIS — R579 Shock, unspecified: Secondary | ICD-10-CM

## 2021-12-04 DIAGNOSIS — N186 End stage renal disease: Secondary | ICD-10-CM | POA: Diagnosis not present

## 2021-12-04 DIAGNOSIS — Z992 Dependence on renal dialysis: Secondary | ICD-10-CM | POA: Diagnosis not present

## 2021-12-04 LAB — GASTROINTESTINAL PANEL BY PCR, STOOL (REPLACES STOOL CULTURE)

## 2021-12-04 LAB — GLUCOSE, CAPILLARY
Glucose-Capillary: 143 mg/dL — ABNORMAL HIGH (ref 70–99)
Glucose-Capillary: 174 mg/dL — ABNORMAL HIGH (ref 70–99)
Glucose-Capillary: 165 mg/dL — ABNORMAL HIGH (ref 70–99)
Glucose-Capillary: 178 mg/dL — ABNORMAL HIGH (ref 70–99)
Glucose-Capillary: 156 mg/dL — ABNORMAL HIGH (ref 70–99)
Glucose-Capillary: 148 mg/dL — ABNORMAL HIGH (ref 70–99)

## 2021-12-04 LAB — CBC WITH DIFFERENTIAL/PLATELET
Abs Immature Granulocytes: 0.48 10*3/uL — ABNORMAL HIGH (ref 0.00–0.07)
Basophils Absolute: 0.1 10*3/uL (ref 0.0–0.1)
Basophils Relative: 0 %
Eosinophils Absolute: 0 10*3/uL (ref 0.0–0.5)
Eosinophils Relative: 0 %
HCT: 26.3 % — ABNORMAL LOW (ref 39.0–52.0)
Hemoglobin: 9.1 g/dL — ABNORMAL LOW (ref 13.0–17.0)
Immature Granulocytes: 1 %
Lymphocytes Relative: 3 %
Lymphs Abs: 1.2 10*3/uL (ref 0.7–4.0)
MCH: 29.3 pg (ref 26.0–34.0)
MCHC: 34.6 g/dL (ref 30.0–36.0)
MCV: 84.6 fL (ref 80.0–100.0)
Monocytes Absolute: 3.4 10*3/uL — ABNORMAL HIGH (ref 0.1–1.0)
Monocytes Relative: 10 %
Neutro Abs: 28.9 10*3/uL — ABNORMAL HIGH (ref 1.7–7.7)
Neutrophils Relative %: 86 %
Platelets: 226 10*3/uL (ref 150–400)
RBC: 3.11 MIL/uL — ABNORMAL LOW (ref 4.22–5.81)
RDW: 14.6 % (ref 11.5–15.5)
WBC: 34 10*3/uL — ABNORMAL HIGH (ref 4.0–10.5)
nRBC: 0 % (ref 0.0–0.2)

## 2021-12-04 LAB — BASIC METABOLIC PANEL
Anion gap: 17 — ABNORMAL HIGH (ref 5–15)
BUN: 35 mg/dL — ABNORMAL HIGH (ref 8–23)
CO2: 22 mmol/L (ref 22–32)
Calcium: 8.3 mg/dL — ABNORMAL LOW (ref 8.9–10.3)
Chloride: 94 mmol/L — ABNORMAL LOW (ref 98–111)
Creatinine, Ser: 4.44 mg/dL — ABNORMAL HIGH (ref 0.61–1.24)
GFR, Estimated: 14 mL/min — ABNORMAL LOW (ref >60)
Glucose, Bld: 184 mg/dL — ABNORMAL HIGH (ref 70–99)
Potassium: 3 mmol/L — ABNORMAL LOW (ref 3.5–5.1)
Sodium: 133 mmol/L — ABNORMAL LOW (ref 135–145)

## 2021-12-04 LAB — LACTIC ACID, PLASMA: Lactic Acid, Venous: 1.5 mmol/L (ref 0.5–1.9)

## 2021-12-04 LAB — MRSA NEXT GEN BY PCR, NASAL: MRSA by PCR Next Gen: NOT DETECTED

## 2021-12-04 LAB — PHOSPHORUS: Phosphorus: 4.2 mg/dL (ref 2.5–4.6)

## 2021-12-04 LAB — C DIFFICILE QUICK SCREEN W PCR REFLEX
C Diff antigen: NEGATIVE
C Diff interpretation: NOT DETECTED
C Diff toxin: NEGATIVE

## 2021-12-04 LAB — MAGNESIUM: Magnesium: 1.9 mg/dL (ref 1.7–2.4)

## 2021-12-04 MED ORDER — POTASSIUM CHLORIDE CRYS ER 20 MEQ PO TBCR
40.0000 meq | EXTENDED_RELEASE_TABLET | Freq: Two times a day (BID) | ORAL | Status: AC
Start: 2021-12-04 — End: 2021-12-04
  Administered 2021-12-04 (×2): 40 meq via ORAL
  Filled 2021-12-04 (×2): qty 2

## 2021-12-04 MED ORDER — MIDODRINE HCL 5 MG PO TABS: 10.0000 mg | ORAL_TABLET | Freq: Three times a day (TID) | ORAL | Status: DC

## 2021-12-04 MED ORDER — VANCOMYCIN VARIABLE DOSE PER UNSTABLE RENAL FUNCTION (PHARMACIST DOSING): Status: DC

## 2021-12-04 MED ORDER — ACETAMINOPHEN 325 MG PO TABS
650.0000 mg | ORAL_TABLET | Freq: Four times a day (QID) | ORAL | Status: DC | PRN
  Administered 2021-12-11: 650 mg via ORAL
  Filled 2021-12-04: qty 2

## 2021-12-04 MED ORDER — SODIUM CHLORIDE 0.9 % IV SOLN
1.0000 g | INTRAVENOUS | Status: DC
  Filled 2021-12-04: qty 10

## 2021-12-04 MED ORDER — VANCOMYCIN HCL 125 MG PO CAPS
125.0000 mg | ORAL_CAPSULE | Freq: Four times a day (QID) | ORAL | Status: DC
  Administered 2021-12-04 (×4): 125 mg via ORAL
  Filled 2021-12-04 (×6): qty 1

## 2021-12-04 MED ORDER — MIDODRINE HCL 5 MG PO TABS
10.0000 mg | ORAL_TABLET | Freq: Three times a day (TID) | ORAL | Status: DC
  Administered 2021-12-04 – 2021-12-11 (×21): 10 mg via ORAL
  Filled 2021-12-04 (×21): qty 2

## 2021-12-04 MED ORDER — SODIUM CHLORIDE 0.9 % IV SOLN
2.0000 g | INTRAVENOUS | Status: AC
Start: 2021-12-04 — End: 2021-12-04
  Administered 2021-12-04: 2 g via INTRAVENOUS
  Filled 2021-12-04: qty 12.5

## 2021-12-04 NOTE — Progress Notes (Signed)
[facility] Progress Note Patient Name: RAYHAAN HUSTER DOB: 11-11-1960 MRN: [id number]   Date of Service  12/04/2021  HPI/Events of Note  Stool is C-Diff negative.  eICU Interventions  Enteric precautions discontinued.        [name] Constancia Geeting 12/04/2021, 11:54 PM

## 2021-12-04 NOTE — Evaluation (Signed)
Occupational Therapy Evaluation Patient Details Name: Kevin Soto MRN: [id number] DOB: 1960-08-14 Today's Date: 12/04/2021   History of Present Illness  61 yo male presenting to ED on 7/16 with weakness. PCCM consulted for persistent hypotension. Admitted to ICU. PMH including HTN, CAD, ESRD (HD T, TH, sat), dilated cardiomyopathy, CVA, T2DM.    Clinical Impression   PTA, pt was living with his sister and was performing ADLs; sister performing IADLs. Pt currently requiring Min A for UB ADLs, Min-Mod A for LB ADLs, and Min A +2 for functional transfers. Pt reporting dizziness upon standing and noted drop in BP. Pt would benefit from further acute OT to facilitate safe dc. Pending progress, recommend dc to home with HHOT for further OT to optimize safety, independence with ADLs, and return to PLOF.    98 bpm, 93/74 supine, SpO2 100% RA 104 bpm 108/97 sitting 104 bpm, 103/79 sitting 3 min 110 bpm, 84/57 standing 103 bpm, 92/64 supine, SpO2 100% on RA   Recommendations for follow up therapy are one component of a multi-disciplinary discharge planning process, led by the attending physician.  Recommendations may be updated based on patient status, additional functional criteria and insurance authorization.   Follow Up Recommendations  Home health OT    Assistance Recommended at Discharge Frequent or constant Supervision/Assistance  Patient can return home with the following A lot of help with walking and/or transfers;A little help with bathing/dressing/bathroom    Functional Status Assessment  Patient has had a recent decline in their functional status and demonstrates the ability to make significant improvements in function in a reasonable and predictable amount of time.  Equipment Recommendations  None recommended by OT    Recommendations for Other Services       Precautions / Restrictions Precautions Precautions: Fall;Other (comment) Precaution Comments:  Hypotension Restrictions Weight Bearing Restrictions: No      Mobility Bed Mobility Overal bed mobility: Needs Assistance Bed Mobility: Supine to Sit, Sit to Supine     Supine to sit: Min assist Sit to supine: Max assist, +2 for physical assistance, +2 for safety/equipment   General bed mobility comments: Min A to elevate trunk. Max A +2 for returning to supine due to dizziness    Transfers Overall transfer level: Needs assistance Equipment used: 2 person hand held assist Transfers: Sit to/from Stand Sit to Stand: Min assist, +2 physical assistance, +2 safety/equipment           General transfer comment: Min A for gaining balance and correcting posterior lean.      Balance Overall balance assessment: Needs assistance Sitting-balance support: No upper extremity supported, Feet supported Sitting balance-Leahy Scale: Fair     Standing balance support: Bilateral upper extremity supported, During functional activity Standing balance-Leahy Scale: Poor                             ADL either performed or assessed with clinical judgement   ADL Overall ADL's : Needs assistance/impaired Eating/Feeding: Set up;Sitting   Grooming: Wash/dry face;Sitting;Min guard   Upper Body Bathing: Minimal assistance;Sitting   Lower Body Bathing: Moderate assistance;Sit to/from stand   Upper Body Dressing : Minimal assistance;Sitting   Lower Body Dressing: Minimal assistance;Sit to/from stand Lower Body Dressing Details (indicate cue type and reason): Pt using figure four to don socks at EOB. Min A for standing balance Toilet Transfer: Minimal assistance;+2 for physical assistance;+2 for safety/equipment  Functional mobility during ADLs: Minimal assistance;+2 for physical assistance;+2 for safety/equipment (side steps to [facility]; limited by BP) General ADL Comments: Pt presenting with decreased balance, strength, cognition, and activity tolerance     Vision          Perception     Praxis      Pertinent Vitals/Pain Pain Assessment Pain Assessment: Faces Faces Pain Scale: No hurt Pain Intervention(s): Monitored during session     Hand Dominance Right   Extremity/Trunk Assessment Upper Extremity Assessment Upper Extremity Assessment: Generalized weakness   Lower Extremity Assessment Lower Extremity Assessment: Defer to PT evaluation   Cervical / Trunk Assessment Cervical / Trunk Assessment: Kyphotic   Communication Communication Communication: No difficulties   Cognition Arousal/Alertness: Awake/alert Behavior During Therapy: WFL for tasks assessed/performed, Flat affect Overall Cognitive Status: Impaired/Different from baseline Area of Impairment: Attention, Problem solving, Following commands                   Current Attention Level: Selective   Following Commands: Follows one step commands with increased time     Problem Solving: Slow processing, Requires verbal cues       General Comments  98 bpm, 93/74 supine with MAP 81, 100% RA; 104 bpm 108/97 sitting; 104 bpm, 103/79 sitting 3 min; 110 bpm, 84/57 standing; and 103 bpm, 92/64 supine, SpO2 100% on RA    Exercises     Shoulder Instructions      Home Living Family/patient expects to be discharged to:: Private residence Living Arrangements: Other relatives (sister) Available Help at Discharge: Family Type of Home: House Home Access: Stairs to enter Technical brewer of Steps: 4 Entrance Stairs-Rails: Right Home Layout: One level     Bathroom Shower/Tub: Teacher, early years/pre: Standard     Home Equipment: Conservation officer, nature (2 wheels);Cane - single point;Shower seat;BSC/3in1          Prior Functioning/Environment Prior Level of Function : Needs assist             Mobility Comments: using walker intermittently ADLs Comments: pt independent ADL's, sister does cooking/cleaning/laundry, pt does not drive        OT Problem  List: Decreased strength;Decreased range of motion;Decreased activity tolerance;Impaired balance (sitting and/or standing);Decreased knowledge of use of DME or AE;Decreased knowledge of precautions;Decreased cognition      OT Treatment/Interventions: Self-care/ADL training;Therapeutic exercise;Energy conservation;DME and/or AE instruction;Therapeutic activities;Patient/family education    OT Goals(Current goals can be found in the care plan section) Acute Rehab OT Goals Patient Stated Goal: Go home OT Goal Formulation: With patient Time For Goal Achievement: 12/18/21 Potential to Achieve Goals: Good  OT Frequency: Min 3X/week    Co-evaluation              AM-PAC OT "6 Clicks" Daily Activity     Outcome Measure Help from another person eating meals?: A Little Help from another person taking care of personal grooming?: A Little Help from another person toileting, which includes using toliet, bedpan, or urinal?: A Little Help from another person bathing (including washing, rinsing, drying)?: A Lot Help from another person to put on and taking off regular upper body clothing?: A Little Help from another person to put on and taking off regular lower body clothing?: A Little 6 Click Score: 17   End of Session Nurse Communication: Mobility status;Other (comment) (BP)  Activity Tolerance: Other (comment);Treatment limited secondary to medical complications (Comment) (Limited by BP) Patient left: in bed;with call bell/phone within reach;with bed  alarm set  OT Visit Diagnosis: Unsteadiness on feet (R26.81);Other abnormalities of gait and mobility (R26.89);Muscle weakness (generalized) (M62.81)                Time: 3532-9924 OT Time Calculation (min): 21 min Charges:  OT General Charges $OT Visit: 1 Visit OT Evaluation $OT Eval Moderate Complexity: 1 Mod  Rubena Roseman MSOT, OTR/L Acute Rehab Office: [place] 12/04/2021, 3:16 PM

## 2021-12-04 NOTE — Progress Notes (Signed)
NAME:  Kevin Soto, MRN:  [id number], DOB:  [birth year], LOS: 1 ADMISSION DATE:  12/03/2021, CONSULTATION DATE:  12/03/21 REFERRING MD:  Graciella Freer CHIEF COMPLAINT:  Hypotension   History of Present Illness:  Patient is a 61 year old male with pertinent PMH HTN, CAD, ESRD (HD T, TH, sat), dilated cardiomyopathy, CVA, T2DM presents to [facility] ED on 7/16 with weakness.  Patient's primary caregiver is Sister.  Family states that he has been more lethargic since getting dialysis on 7/15.  He had limited mobility and trouble with balance, needing more assistance than normal.  Of note, patient has been having difficulty walking over the past 8 months since his stroke and is working with PT.  Family states he has had no fever but has been having diarrhea.  Patient was not sure but feels like dialysis might have pulled more off on Saturday and has had decreased appetite.   Upon arrival to [facility] ED on 7/16, patient febrile 101.4 F and mildly hypotensive.  Sinus tach 110s.  On room air.  LA 2.  WBC 32.  Hgb 10.4.  K3.3, glucose 219, BUN 29, creat 4.28.  CXR with no acute findings.  Patient given IV fluids.  Culture sent.  Started on cefepime and vanc.  Patient's tunneled HD cath without erythema or drainage.  Despite IV fluids patient still borderline hypotensive.  PCCM consulted for ICU admission. Pertinent  Medical History  HTN, CAD, ESRD (HD T, TH, sat), dilated cardiomyopathy, CVA, T2DM Significant Hospital Events: Including procedures, antibiotic start and stop dates in addition to other pertinent events   07/16: Presented to ED and admitted to IMTS 07/16: PCCM consulted for persistent hypotension. Admitted to ICU. Started on Levo.  07/17: Midodrine started Interim History / Subjective:  No acute concerns. States feels fatigued. Was feeling at baseline 2-3 days ago. No coughing, shortness of breath or chest pain.  Review of Systems:   Negative unless stated in the subjective.  Objective Tmax in last 24 hrs  is 101.4. MAPs in 3s.  Blood pressure 126/70, pulse 95, temperature 98.7 F (37.1 C), temperature source Oral, resp. rate 19, height 6' (1.829 m), weight 87.4 kg, SpO2 96 %.        Intake/Output Summary (Last 24 hours) at 12/04/2021 6578 Last data filed at 12/04/2021 0800 Gross per 24 hour  Intake 3286.16 ml  Output --  Net 3286.16 ml   Filed Weights   12/03/21 1326 12/03/21 2300  Weight: 87 kg 87.4 kg   Examination: General: ill appearing male in NAD HENT: NCAT Lungs: CTAB Cardiovascular: tachycardic, 2+ radial pulses, No LE edema Abdomen: No TTP, normal bowel sounds Extremities: No asymmetry Neuro: alert and oriented x4 GU: condom catheter present.  [facility] Problem list    Assessment & Plan:  Septic shock 2/2 bacterial infection Diarrhea Source unknown but likely GI given GI symptoms. Most likely bacterial infection given WBC with neutrophil predominance and procal elevation. WBC up-trending at 32 k this am. Procal 42. LA has resolved. Blood cultures pending. GI panel pending. C. Diff testing pending. CXR without any abnormalities. On Cefepime and oral Vanc. -Continue abx, follow up culture and infection work up results.  -Continue levophed to maintain MAP>65.  -Start Midodrine 10 mg TID.    ESRD on HD TThSat Mild hypokalemia Patient receives HD on TThSat and is oligouric. BP during dialysis have been normotensive. K 3.0 this am so we will give 40 mg BID of K.  -Nephro consulted -Trend BMP  -  Replace electrolytes as indicated -Avoid nephrotoxic agents, ensure adequate renal perfusion   Hyperglycemia DMT2 Goal is <180.  -SSI and CBG monitoring   Dilated cardiomyopathy:  HTN CAD HLD :  Echo in 2015 LVEF 55%. Not on statin due to concern for concern myoglobin cast and possible muscle toxicity -Hold home antihypertensives given hypotension concern.  -Continue ASA and Plavix   Prostate cancer BPH S/p seed implant -Flomax -Trend UOP    CVA -ASA and Plavix   GERD -Continue PPI Best Practice (right click and "Reselect all SmartList Selections" daily)  Diet/type: Renal DVT prophylaxis: prophylactic heparin  GI prophylaxis: PPI Lines: N/A Foley:  No Continuous: Levo 6 mcg Code Status:  full code Last date of multidisciplinary goals of care discussion [Discussed with pt 07/17] Labs  CBC shows leukocytosis at 34 k up from 32k. Hg at 9.1. LA normalized at 1.5. BMP shows K at 3.0. Cr 4.44, BUN 35, Na 133, Cl 94. Calcium 8.3, alb 3.0 corrected 9.1. Pro cal is 42.7  CBC: Recent Labs  Lab 12/03/21 1352 12/04/21 0207  WBC 32.2* 34.0*  NEUTROABS 27.9* 28.9*  HGB 10.4* 9.1*  HCT 31.4* 26.3*  MCV 88.0 84.6  PLT 276 361   Basic Metabolic Panel: Recent Labs  Lab 12/03/21 1352 12/03/21 2215 12/04/21 0207  NA 135  --  133*  K 3.3*  --  3.0*  CL 95*  --  94*  CO2 24  --  22  GLUCOSE 219*  --  184*  BUN 29*  --  35*  CREATININE 4.28*  --  4.44*  CALCIUM 8.8*  --  8.3*  MG  --  1.3* 1.9  PHOS  --   --  4.2   GFR: Estimated Creatinine Clearance: 18.7 mL/min (A) (by C-G formula based on SCr of 4.44 mg/dL (H)). Recent Labs  Lab 12/03/21 1352 12/03/21 1704 12/03/21 2215 12/04/21 0207  PROCALCITON  --   --  42.71  --   WBC 32.2*  --   --  34.0*  LATICACIDVEN 2.0* 2.0* 1.6 1.5   Liver Function Tests: Recent Labs  Lab 12/03/21 1352  AST 19  ALT 14  ALKPHOS 72  BILITOT 1.6*  PROT 7.2  ALBUMIN 3.0*   No results for input(s): "LIPASE", "AMYLASE" in the last 168 hours. No results for input(s): "AMMONIA" in the last 168 hours. ABG No results found for: "PHART", "PCO2ART", "PO2ART", "HCO3", "TCO2", "ACIDBASEDEF", "O2SAT"  Coagulation Profile: No results for input(s): "INR", "PROTIME" in the last 168 hours. Cardiac Enzymes: No results for input(s): "CKTOTAL", "CKMB", "CKMBINDEX", "TROPONINI" in the last 168 hours. HbA1C: Hemoglobin A1C  Date/Time Value Ref Range Status  07/14/2021 09:59 AM 7.2 (A) 4.0 -  5.6 % Final  07/29/2020 12:10 PM 6.5 (A) 4.0 - 5.6 % Final   Hgb A1c MFr Bld  Date/Time Value Ref Range Status  09/28/2021 02:36 AM 7.3 (H) 4.8 - 5.6 % Final    Comment:    (NOTE) Pre diabetes:          5.7%-6.4%  Diabetes:              >6.4%  Glycemic control for   <7.0% adults with diabetes   02/27/2011 05:23 PM 8.7 (H) <5.7 % Final    Comment:    (NOTE)  According to the ADA Clinical Practice Recommendations for 2011, when HbA1c is used as a screening test:  >=6.5%   Diagnostic of Diabetes Mellitus           (if abnormal result is confirmed) 5.7-6.4%   Increased risk of developing Diabetes Mellitus References:Diagnosis and Classification of Diabetes Mellitus,Diabetes UXLK,4401,02(VOZDG 1):S62-S69 and Standards of Medical Care in         Diabetes - 2011,Diabetes UYQI,3474,25 (Suppl 1):S11-S61.   CBG: Recent Labs  Lab 12/03/21 2138 12/03/21 2259 12/04/21 0306 12/04/21 0709  GLUCAP 160* 143* 174* 165*   Past Medical History:  He,  has a past medical history of Anemia, CAD (coronary artery disease), Chronic kidney disease, Diabetes mellitus, Dyslipidemia, ED (erectile dysfunction), Gait instability (02/27/2011), History of alcohol abuse, History of stomach ulcers, History of stroke, Hyperlipidemia, Hypertension, Ischemic cardiomyopathy, Macular hemorrhage, Prostate cancer ([place]), Sleep apnea, and Vision, loss, sudden (02/27/2011).  Surgical History:   Past Surgical History:  Procedure Laterality Date   BIOPSY  10/04/2021   Procedure: BIOPSY;  Surgeon: Lavena Bullion, DO;  Location: [facility];  Service: Gastroenterology;;   CORONARY ANGIOPLASTY WITH STENT PLACEMENT  2012   2 stents   ESOPHAGOGASTRODUODENOSCOPY (EGD) WITH PROPOFOL N/A 10/04/2021   Procedure: ESOPHAGOGASTRODUODENOSCOPY (EGD) WITH PROPOFOL;  Surgeon: Lavena Bullion, DO;  Location: [facility];  Service: Gastroenterology;   Laterality: N/A;   IR FLUORO GUIDE CV LINE RIGHT  09/29/2021   IR US GUIDE VASC ACCESS RIGHT  09/29/2021   RADIOACTIVE SEED IMPLANT N/A 05/08/2017   Procedure: RADIOACTIVE SEED IMPLANT/BRACHYTHERAPY IMPLANT;  Surgeon: Alexis Frock, MD;  Location: [facility];  Service: Urology;  Laterality: N/A;   REFRACTIVE SURGERY  2012   right and left eye/ per pt only left eye   SPACE OAR INSTILLATION N/A 05/08/2017   Procedure: SPACE OAR INSTILLATION;  Surgeon: Alexis Frock, MD;  Location: [facility];  Service: Urology;  Laterality: N/A;    Social History:   reports that he has never smoked. He has never used smokeless tobacco. He reports current alcohol use of about 4.0 standard drinks of alcohol per week. He reports that he does not use drugs.  Family History:  His family history includes Cancer in his father; Crohn's disease in his sister and another family member; Diabetes in his father and mother; Heart attack in his father and mother; Heart disease in his sister; Heart failure in his father and mother; Kidney disease in his brother; Prostate cancer in his father. He was adopted.  Allergies Allergies  Allergen Reactions   Clonidine Derivatives Other (See Comments)    Dizziness Drowsiness     Home Medications  Prior to Admission medications   Medication Sig Start Date End Date Taking? Authorizing Provider  acetaminophen (TYLENOL) 325 MG tablet Take 2 tablets (650 mg total) by mouth every 6 (six) hours as needed for mild pain (or Fever >/= 101). 10/05/21   Lajean Manes, MD  aspirin EC 81 MG tablet Take 1 tablet (81 mg total) by mouth daily. 06/08/20   Harvie Heck, MD  carvedilol (COREG) 25 MG tablet Take 1 tablet (25 mg total) by mouth 2 (two) times daily with a meal. 10/05/21   Lajean Manes, MD  clopidogrel (PLAVIX) 75 MG tablet Take 1 tablet (75 mg total) by mouth daily. 06/14/21   Rehman, Areeg N, DO  dorzolamide-timolol (COSOPT) 22.3-6.8 MG/ML ophthalmic  solution Place 1 drop into both eyes daily. 03/25/20   [provider]  Dulaglutide (TRULICITY)  3 MG/0.5ML SOPN Inject 3 mg as directed once a week. 11/08/21   Lajean Manes, MD  Empagliflozin-metFORMIN HCl ER (SYNJARDY XR) 09-998 MG TB24 Take 1 tablet by mouth daily. Take 1 tablet by mouth twice a day.    [provider]  fluticasone (FLONASE) 50 MCG/ACT nasal spray Place 2 sprays into both nostrils daily. 06/08/20   Harvie Heck, MD  hydrochlorothiazide (HYDRODIURIL) 25 MG tablet Take 25 mg by mouth daily.    [provider]  lisinopril (ZESTRIL) 20 MG tablet Take 20 mg by mouth daily.    [provider]  nitroGLYCERIN (NITROSTAT) 0.4 MG SL tablet Place 1 tablet (0.4 mg total) under the tongue every 5 (five) minutes as needed. Patient taking differently: Place 0.4 mg under the tongue every 5 (five) minutes as needed for chest pain. 06/08/20   Harvie Heck, MD  pantoprazole (PROTONIX) 40 MG tablet Take 1 tablet (40 mg total) by mouth 2 (two) times daily. 10/05/21   Lajean Manes, MD  senna-docusate (SENOKOT-S) 8.6-50 MG tablet Take 1 tablet by mouth at bedtime as needed for mild constipation. 10/05/21   Lajean Manes, MD  tamsulosin (FLOMAX) 0.4 MG CAPS capsule Take 1 capsule (0.4 mg total) by mouth daily. 06/14/21   Rehman, Areeg N, DO  Vitamin D, Ergocalciferol, (DRISDOL) 1.25 MG (50000 UNIT) CAPS capsule Take 50,000 Units by mouth every 7 (seven) days.    [provider]    Idamae Schuller, MD Tillie Rung. [facility] Internal Medicine Residency, PGY-2

## 2021-12-04 NOTE — Progress Notes (Signed)
  eLink Physician-Brief Progress Note Patient Name: Kevin Soto DOB: 08-12-1960 MRN: [id number]   Date of Service  12/04/2021  HPI/Events of Note  Patient is being treated for C-Diff enteritis, and attending progress note reflects that stool was sent for C-Diff but it actually has not been sent, bedside RN requesting an order to send sample which has been collected.  eICU Interventions  Stool for C-Diff ordered.        [name] Shonette Rhames 12/04/2021, 10:37 PM

## 2021-12-04 NOTE — Evaluation (Signed)
Physical Therapy Evaluation Patient Details Name: Kevin Soto MRN: [id number] DOB: November 23, 1960 Today's Date: 12/04/2021  History of Present Illness  61 yo male presenting to ED on 7/16 with weakness. PCCM consulted for persistent hypotension. Admitted to ICU. PMH including HTN, CAD, ESRD (HD T, TH, sat), dilated cardiomyopathy, CVA, T2DM.  Clinical Impression  Pt admitted with above diagnosis. Pt limited by dizziness with low BP's.  Pt did tolerate sitting and standing but only able to stand a brief time with BP dropping. Once BP under control, anticipate good progress.   Pt currently with functional limitations due to the deficits listed below (see PT Problem List). Pt will benefit from skilled PT to increase their independence and safety with mobility to allow discharge to the venue listed below.          Recommendations for follow up therapy are one component of a multi-disciplinary discharge planning process, led by the attending physician.  Recommendations may be updated based on patient status, additional functional criteria and insurance authorization.  Follow Up Recommendations Home health PT (Should progress to [facility] if BP stabilizes)      Assistance Recommended at Discharge Intermittent Supervision/Assistance  Patient can return home with the following  A little help with walking and/or transfers;A little help with bathing/dressing/bathroom;Assistance with cooking/housework;Assist for transportation;Help with stairs or ramp for entrance    Equipment Recommendations None recommended by PT  Recommendations for Other Services       Functional Status Assessment Patient has had a recent decline in their functional status and demonstrates the ability to make significant improvements in function in a reasonable and predictable amount of time.     Precautions / Restrictions Precautions Precautions: Fall;Other (comment) Precaution Comments: Hypotension Restrictions Weight Bearing  Restrictions: No      Mobility  Bed Mobility Overal bed mobility: Needs Assistance Bed Mobility: Supine to Sit, Sit to Supine     Supine to sit: Min assist Sit to supine: Max assist, +2 for physical assistance, +2 for safety/equipment   General bed mobility comments: Min A to elevate trunk. Max A +2 for returning to supine due to dizziness    Transfers Overall transfer level: Needs assistance Equipment used: 2 person hand held assist Transfers: Sit to/from Stand Sit to Stand: Min assist, +2 physical assistance, +2 safety/equipment           General transfer comment: Min A for gaining balance and correcting posterior lean.    Ambulation/Gait                  Stairs            Wheelchair Mobility    Modified Rankin (Stroke Patients Only)       Balance Overall balance assessment: Needs assistance Sitting-balance support: No upper extremity supported, Feet supported Sitting balance-Leahy Scale: Fair     Standing balance support: Bilateral upper extremity supported, During functional activity Standing balance-Leahy Scale: Poor Standing balance comment: Needed at least 1UE support to stand statically.                             Pertinent Vitals/Pain Pain Assessment Pain Assessment: Faces Faces Pain Scale: No hurt Pain Intervention(s): Limited activity within patient's tolerance, Monitored during session, Repositioned    Home Living Family/patient expects to be discharged to:: Private residence Living Arrangements: Other relatives (sister) Available Help at Discharge: Family Type of Home: House Home Access: Stairs to enter Entrance Stairs-Rails: Right  Entrance Stairs-Number of Steps: 4   Home Layout: One level Home Equipment: Conservation officer, nature (2 wheels);Cane - single point;Shower seat;BSC/3in1      Prior Function Prior Level of Function : Needs assist             Mobility Comments: using walker intermittently ADLs  Comments: pt independent ADL's, sister does cooking/cleaning/laundry, pt does not drive     Hand Dominance   Dominant Hand: Right    Extremity/Trunk Assessment   Upper Extremity Assessment Upper Extremity Assessment: Defer to OT evaluation    Lower Extremity Assessment Lower Extremity Assessment: Generalized weakness    Cervical / Trunk Assessment Cervical / Trunk Assessment: Kyphotic  Communication   Communication: No difficulties  Cognition Arousal/Alertness: Awake/alert Behavior During Therapy: WFL for tasks assessed/performed, Flat affect Overall Cognitive Status: Impaired/Different from baseline Area of Impairment: Attention, Problem solving, Following commands                   Current Attention Level: Selective   Following Commands: Follows one step commands with increased time     Problem Solving: Slow processing, Requires verbal cues          General Comments General comments (skin integrity, edema, etc.): 98 bpm, 93/74 supine with MAP 81, 100% RA; 104 bpm 108/97 sitting; 104 bpm, 103/79 sitting 3 min; 110 bpm, 84/57 standing; and 103 bpm, 92/64 supine, SpO2 100% on RA    Exercises     Assessment/Plan    PT Assessment Patient needs continued PT services  PT Problem List Decreased activity tolerance;Decreased balance;Decreased mobility;Decreased knowledge of use of DME;Decreased safety awareness;Decreased knowledge of precautions;Cardiopulmonary status limiting activity       PT Treatment Interventions DME instruction;Gait training;Functional mobility training;Therapeutic activities;Therapeutic exercise;Balance training;Patient/family education;Stair training    PT Goals (Current goals can be found in the Care Plan section)  Acute Rehab PT Goals Patient Stated Goal: to go home PT Goal Formulation: With patient Time For Goal Achievement: 12/18/21 Potential to Achieve Goals: Good    Frequency Min 3X/week     Co-evaluation PT/OT/SLP  Co-Evaluation/Treatment: Yes Reason for Co-Treatment: Complexity of the patient's impairments (multi-system involvement);For patient/therapist safety PT goals addressed during session: Mobility/safety with mobility         AM-PAC PT "6 Clicks" Mobility  Outcome Measure Help needed turning from your back to your side while in a flat bed without using bedrails?: A Little Help needed moving from lying on your back to sitting on the side of a flat bed without using bedrails?: A Lot Help needed moving to and from a bed to a chair (including a wheelchair)?: Total Help needed standing up from a chair using your arms (e.g., wheelchair or bedside chair)?: Total Help needed to walk in hospital room?: Total Help needed climbing 3-5 steps with a railing? : Total 6 Click Score: 9    End of Session Equipment Utilized During Treatment: Gait belt Activity Tolerance: Patient limited by fatigue Patient left: with call bell/phone within reach;in bed;with bed alarm set Nurse Communication: Mobility status PT Visit Diagnosis: Unsteadiness on feet (R26.81);Muscle weakness (generalized) (M62.81)    Time: 6962-9528 PT Time Calculation (min) (ACUTE ONLY): 21 min   Charges:   PT Evaluation $PT Eval Moderate Complexity: 1 Mod          Jaycen Vercher M,PT Acute Rehab Services [phone]   Alvira Philips 12/04/2021, 3:46 PM

## 2021-12-04 NOTE — Progress Notes (Signed)
12/04/2021 I saw and evaluated the patient. Discussed with resident and agree with resident's findings and plan as documented in the resident's note.  I have seen and evaluated the patient for septic shock.  S:  Remains on levophed. States feeling a bit better. 1 loose stool charted overnight.  O: Blood pressure (!) 106/58, pulse 96, temperature 98.7 F (37.1 C), temperature source Oral, resp. rate 17, height 6' (1.829 m), weight 87.4 kg, SpO2 94 %.  No distress RIJ tunneled HD catheter CDI Abdomen soft, mild tenderness to deep palpation Lungs clear, no accessory muscle use Moves all 4 ext to command  A:  Septic vs. Hypovolemic shock in setting of GI illness; nasal MRSA PCR neg Ischemic cardiomyopathy ESRD on HD DM2  P:  IV flagyl/cefepime, add PO vanc F/u stool studies including C diff Levophed titrated to MAP 65 Add midodrine Further plans per resident note  Patient critically ill due to shock Interventions to address this today pressor titration Risk of deterioration without these interventions is high  I personally spent 34 minutes providing critical care not including any separately billable procedures  Erskine Emery MD [facility] Prefer epic messenger for cross cover needs If after hours, please call E-link

## 2021-12-05 ENCOUNTER — Inpatient Hospital Stay (HOSPITAL_COMMUNITY): Payer: Medicare Other

## 2021-12-05 DIAGNOSIS — N186 End stage renal disease: Secondary | ICD-10-CM | POA: Diagnosis not present

## 2021-12-05 DIAGNOSIS — Z992 Dependence on renal dialysis: Secondary | ICD-10-CM | POA: Diagnosis not present

## 2021-12-05 DIAGNOSIS — R571 Hypovolemic shock: Secondary | ICD-10-CM

## 2021-12-05 LAB — RENAL FUNCTION PANEL
Albumin: 2.2 g/dL — ABNORMAL LOW (ref 3.5–5.0)
Anion gap: 16 — ABNORMAL HIGH (ref 5–15)
BUN: 47 mg/dL — ABNORMAL HIGH (ref 8–23)
CO2: 21 mmol/L — ABNORMAL LOW (ref 22–32)
Calcium: 8.1 mg/dL — ABNORMAL LOW (ref 8.9–10.3)
Chloride: 94 mmol/L — ABNORMAL LOW (ref 98–111)
Creatinine, Ser: 4.96 mg/dL — ABNORMAL HIGH (ref 0.61–1.24)
GFR, Estimated: 12 mL/min — ABNORMAL LOW (ref >60)
Glucose, Bld: 163 mg/dL — ABNORMAL HIGH (ref 70–99)
Phosphorus: 3.4 mg/dL (ref 2.5–4.6)
Potassium: 3.3 mmol/L — ABNORMAL LOW (ref 3.5–5.1)
Sodium: 131 mmol/L — ABNORMAL LOW (ref 135–145)

## 2021-12-05 LAB — CBC WITH DIFFERENTIAL/PLATELET
Abs Immature Granulocytes: 0.34 10*3/uL — ABNORMAL HIGH (ref 0.00–0.07)
Basophils Absolute: 0 10*3/uL (ref 0.0–0.1)
Basophils Relative: 0 %
Eosinophils Absolute: 0.1 10*3/uL (ref 0.0–0.5)
Eosinophils Relative: 0 %
HCT: 23.3 % — ABNORMAL LOW (ref 39.0–52.0)
Hemoglobin: 8.2 g/dL — ABNORMAL LOW (ref 13.0–17.0)
Immature Granulocytes: 1 %
Lymphocytes Relative: 4 %
Lymphs Abs: 1 10*3/uL (ref 0.7–4.0)
MCH: 29.3 pg (ref 26.0–34.0)
MCHC: 35.2 g/dL (ref 30.0–36.0)
MCV: 83.2 fL (ref 80.0–100.0)
Monocytes Absolute: 1.6 10*3/uL — ABNORMAL HIGH (ref 0.1–1.0)
Monocytes Relative: 6 %
Neutro Abs: 22.9 10*3/uL — ABNORMAL HIGH (ref 1.7–7.7)
Neutrophils Relative %: 89 %
Platelets: 262 10*3/uL (ref 150–400)
RBC: 2.8 MIL/uL — ABNORMAL LOW (ref 4.22–5.81)
RDW: 14.4 % (ref 11.5–15.5)
Smear Review: NORMAL
WBC: 25.8 10*3/uL — ABNORMAL HIGH (ref 4.0–10.5)
nRBC: 0 % (ref 0.0–0.2)

## 2021-12-05 LAB — GLUCOSE, CAPILLARY
Glucose-Capillary: 141 mg/dL — ABNORMAL HIGH (ref 70–99)
Glucose-Capillary: 237 mg/dL — ABNORMAL HIGH (ref 70–99)
Glucose-Capillary: 175 mg/dL — ABNORMAL HIGH (ref 70–99)
Glucose-Capillary: 145 mg/dL — ABNORMAL HIGH (ref 70–99)

## 2021-12-05 LAB — ECHOCARDIOGRAM COMPLETE
AR max vel: 2.4 cm2
AV Peak grad: 4.7 mmHg
Ao pk vel: 1.09 m/s
Area-P 1/2: 4.8 cm2
Height: 72 in
S' Lateral: 3.35 cm
Weight: 3135.82 oz

## 2021-12-05 LAB — MAGNESIUM: Magnesium: 1.9 mg/dL (ref 1.7–2.4)

## 2021-12-05 LAB — VANCOMYCIN, RANDOM: Vancomycin Rm: 15 ug/mL

## 2021-12-05 MED ORDER — PERFLUTREN LIPID MICROSPHERE
1.0000 mL | INTRAVENOUS | Status: AC | PRN
  Administered 2021-12-05: 6 mL via INTRAVENOUS

## 2021-12-05 MED ORDER — SODIUM CHLORIDE 0.9 % IV SOLN
1.0000 g | INTRAVENOUS | Status: DC
  Administered 2021-12-05 – 2021-12-07 (×3): 1 g via INTRAVENOUS
  Filled 2021-12-05 (×5): qty 10

## 2021-12-05 MED ORDER — SODIUM CHLORIDE 0.9% FLUSH
10.0000 mL | Freq: Two times a day (BID) | INTRAVENOUS | Status: DC
  Administered 2021-12-05 – 2021-12-12 (×15): 10 mL

## 2021-12-05 MED ORDER — SODIUM CHLORIDE 0.9% FLUSH
10.0000 mL | INTRAVENOUS | Status: DC | PRN
  Administered 2021-12-10: 10 mL

## 2021-12-05 NOTE — Progress Notes (Signed)
Pharmacy Antibiotic Note  Kevin Soto is a 61 y.o. male for which pharmacy has been consulted for cefepime and vancomycin dosing for sepsis.  Patient with a history of ESRD on HD (new, started in May), CVA, HLD, DM. Patient presenting with hypotension.   Last HD session 7/15. Holding off on HD today. Patient made ~630m overnight so checked Vancomycin level. Vancomycin random level = 15 (essentially pre-HD level). At low end of range. Patient having some UOP but only 125 mL all day today.   Plan: Blood cultures negative x48 hours - discussed with Dr. STamala Julianand discontinuing Vancomycin.  Continue Cefepime 1g IV every 24 hours.  Trend WBC, Fever, Renal function, & Clinical course F/u cultures, clinical course, WBC, fever De-escalate when able F/u Nephrology plan  Height: 6' (182.9 cm) Weight: 88.9 kg (195 lb 15.8 oz) IBW/kg (Calculated) : 77.6  Temp (24hrs), Avg:98.7 F (37.1 C), Min:98.1 F (36.7 C), Max:99.4 F (37.4 C)  Recent Labs  Lab 12/03/21 1352 12/03/21 1704 12/03/21 2215 12/04/21 0207 12/05/21 0031 12/05/21 1029  WBC 32.2*  --   --  34.0* 25.8*  --   CREATININE 4.28*  --   --  4.44* 4.96*  --   LATICACIDVEN 2.0* 2.0* 1.6 1.5  --   --   VANCORANDOM  --   --   --   --   --  15    Estimated Creatinine Clearance: 16.7 mL/min (A) (by C-G formula based on SCr of 4.96 mg/dL (H)).    Allergies  Allergen Reactions   Clonidine Derivatives Other (See Comments)    Dizziness Drowsiness     Antimicrobials this admission: cefepime 7/16 >>  vancomycin 7/16 >> 7/18 metronidazole 7/16 >>   Microbiology results: Pending  Thank you for allowing pharmacy to be a part of this patient's care.  JSloan Leiter PharmD, BCPS, BCCCP Clinical Pharmacist Please refer to [facility] Hospitalfor MRoscoenumbers 12/05/2021 3:08 PM

## 2021-12-05 NOTE — Progress Notes (Signed)
NAME:  Kevin Soto, MRN:  [id number], DOB:  August 01, 1960, LOS: 2 ADMISSION DATE:  12/03/2021, CONSULTATION DATE:  12/03/21 REFERRING MD:  Graciella Freer CHIEF COMPLAINT:  Hypotension   History of Present Illness:  Patient is a 61 year old male with pertinent PMH HTN, CAD, ESRD (HD T, TH, sat), dilated cardiomyopathy, CVA, T2DM presents to [facility] ED on 7/16 with weakness.  Patient's primary caregiver is Sister.  Family states that he has been more lethargic since getting dialysis on 7/15.  He had limited mobility and trouble with balance, needing more assistance than normal.  Of note, patient has been having difficulty walking over the past 8 months since his stroke and is working with PT.  Family states he has had no fever but has been having diarrhea.  Patient was not sure but feels like dialysis might have pulled more off on Saturday and has had decreased appetite.   Upon arrival to [facility] ED on 7/16, patient febrile 101.4 F and mildly hypotensive.  Sinus tach 110s.  On room air.  LA 2.  WBC 32.  Hgb 10.4.  K3.3, glucose 219, BUN 29, creat 4.28.  CXR with no acute findings.  Patient given IV fluids.  Culture sent.  Started on cefepime and vanc.  Patient's tunneled HD cath without erythema or drainage.  Despite IV fluids patient still borderline hypotensive.  PCCM consulted for ICU admission. Pertinent  Medical History  HTN, CAD, ESRD (HD T, TH, sat), dilated cardiomyopathy, CVA, T2DM Significant Hospital Events: Including procedures, antibiotic start and stop dates in addition to other pertinent events   07/16: Presented to ED and admitted to IMTS 07/16: PCCM consulted for persistent hypotension. Admitted to ICU. Started on Levo.  07/17: Midodrine started, Bcx repeated due to repeat fever.  07/18: Levo turned off. Nephrology consulted for dialysis. Interim History / Subjective: Febrile yesterday afternoon at 101.3. Afebrile since then. MAP down to 59 overnight but >65 this am. Currently off levophed    Review of Systems:   Negative unless stated in the subjective.  Objective Tmax in last 24 hrs is 101.4. MAPs in 64s.  Blood pressure (!) 120/54, pulse 91, temperature 98.1 F (36.7 C), temperature source Oral, resp. rate (!) 21, height 6' (1.829 m), weight 88.9 kg, SpO2 98 %.        Intake/Output Summary (Last 24 hours) at 12/05/2021 0714 Last data filed at 12/05/2021 0600 Gross per 24 hour  Intake 692.17 ml  Output 900 ml  Net -207.83 ml    Filed Weights   12/03/21 1326 12/03/21 2300 12/05/21 0233  Weight: 87 kg 87.4 kg 88.9 kg   Examination:  General: ill appearing male in NAD HENT: NCAT Lungs: CTAB Cardiovascular: tachycardic, 2+ radial pulses, No LE edema Abdomen: No TTP, normal bowel sounds Extremities: No asymmetry Neuro: alert and oriented x4 GU: condom catheter present.  [facility] Problem list    Assessment & Plan:  Septic shock 2/2 bacterial infection Source unknown. Most likely bacterial infection given WBC with neutrophil predominance and procal elevation. WBC down-trending at 25k this am. Procal 42. LA has resolved. Blood cultures NGTD. GI panel negative. C. Diff testing negative. CXR without any abnormalities. On Cefepime and oral Vanc. On levophed this am.  -Continue abx, follow up culture. Discontinue Vanc 48 hrs after negative Bcx.  -Continue Midodrine 10 mg TID.    ESRD on HD TThSat Mild hypokalemia Mild Hyponatremia Patient receives HD on TThSat and is oligouric at baseline. His UOP in the  last 24 hrs is 900 cc. BP during dialysis have been normotensive. K 3.3 this am. Na mildly low at 131 this am.  -Nephro consulted this am. No urgent need for dialysis yet.  -Trend BMP  -Replace electrolytes as indicated, will hold off repleting K as patient is not getting HD today and potentially will rise gradually.  -Avoid nephrotoxic agents, ensure adequate renal perfusion   Hyperglycemia DMT2 Goal is <180.  -SSI and CBG monitoring    Dilated cardiomyopathy:  HTN CAD HLD :  Echo in 2015 LVEF 55%. Not on statin due to concern for concern myoglobin cast and possible muscle toxicity -Repeat Echo this am.  -Hold home antihypertensives given hypotension concern.  -Continue ASA and Plavix   Prostate cancer BPH S/p seed implant -Flomax -Trend UOP   CVA -ASA and Plavix   GERD -Continue PPI Best Practice (right click and "Reselect all SmartList Selections" daily)  Diet/type: Renal DVT prophylaxis: prophylactic heparin  GI prophylaxis: PPI Lines: N/A Foley:  No Continuous: Levo turned off this am.  Code Status:  full code Last date of multidisciplinary goals of care discussion [Discussed with pt 07/17] Labs  CBC shows leukocytosis at 25.8 k down from 34k. Hg at 8.2. BMP shows K at 3.3. Cr 4.96, BUN 47, Na 131, Cl 94. Calcium 8.1, alb 2.2 corrected 9.5. Pro cal is 42.7  CBC: Recent Labs  Lab 12/03/21 1352 12/04/21 0207 12/05/21 0031  WBC 32.2* 34.0* 25.8*  NEUTROABS 27.9* 28.9* 22.9*  HGB 10.4* 9.1* 8.2*  HCT 31.4* 26.3* 23.3*  MCV 88.0 84.6 83.2  PLT 276 226 366    Basic Metabolic Panel: Recent Labs  Lab 12/03/21 1352 12/03/21 2215 12/04/21 0207 12/05/21 0031  NA 135  --  133* 131*  K 3.3*  --  3.0* 3.3*  CL 95*  --  94* 94*  CO2 24  --  22 21*  GLUCOSE 219*  --  184* 163*  BUN 29*  --  35* 47*  CREATININE 4.28*  --  4.44* 4.96*  CALCIUM 8.8*  --  8.3* 8.1*  MG  --  1.3* 1.9 1.9  PHOS  --   --  4.2 3.4    GFR: Estimated Creatinine Clearance: 16.7 mL/min (A) (by C-G formula based on SCr of 4.96 mg/dL (H)). Recent Labs  Lab 12/03/21 1352 12/03/21 1704 12/03/21 2215 12/04/21 0207 12/05/21 0031  PROCALCITON  --   --  42.71  --   --   WBC 32.2*  --   --  34.0* 25.8*  LATICACIDVEN 2.0* 2.0* 1.6 1.5  --     Liver Function Tests: Recent Labs  Lab 12/03/21 1352 12/05/21 0031  AST 19  --   ALT 14  --   ALKPHOS 72  --   BILITOT 1.6*  --   PROT 7.2  --   ALBUMIN 3.0* 2.2*    No  results for input(s): "LIPASE", "AMYLASE" in the last 168 hours. No results for input(s): "AMMONIA" in the last 168 hours. ABG No results found for: "PHART", "PCO2ART", "PO2ART", "HCO3", "TCO2", "ACIDBASEDEF", "O2SAT"  Coagulation Profile: No results for input(s): "INR", "PROTIME" in the last 168 hours. Cardiac Enzymes: No results for input(s): "CKTOTAL", "CKMB", "CKMBINDEX", "TROPONINI" in the last 168 hours. HbA1C: Hemoglobin A1C  Date/Time Value Ref Range Status  07/14/2021 09:59 AM 7.2 (A) 4.0 - 5.6 % Final  07/29/2020 12:10 PM 6.5 (A) 4.0 - 5.6 % Final   Hgb A1c MFr Bld  Date/Time Value Ref  Range Status  09/28/2021 02:36 AM 7.3 (H) 4.8 - 5.6 % Final    Comment:    (NOTE) Pre diabetes:          5.7%-6.4%  Diabetes:              >6.4%  Glycemic control for   <7.0% adults with diabetes   02/27/2011 05:23 PM 8.7 (H) <5.7 % Final    Comment:    (NOTE)                                                                       According to the ADA Clinical Practice Recommendations for 2011, when HbA1c is used as a screening test:  >=6.5%   Diagnostic of Diabetes Mellitus           (if abnormal result is confirmed) 5.7-6.4%   Increased risk of developing Diabetes Mellitus References:Diagnosis and Classification of Diabetes Mellitus,Diabetes PYKD,9833,82(NKNLZ 1):S62-S69 and Standards of Medical Care in         Diabetes - 2011,Diabetes JQBH,4193,79 (Suppl 1):S11-S61.   CBG: Recent Labs  Lab 12/04/21 0709 12/04/21 1123 12/04/21 1714 12/04/21 2143 12/05/21 0710  GLUCAP 165* 178* 156* 148* 141*    Past Medical History:  He,  has a past medical history of Anemia, CAD (coronary artery disease), Chronic kidney disease, Diabetes mellitus, Dyslipidemia, ED (erectile dysfunction), Gait instability (02/27/2011), History of alcohol abuse, History of stomach ulcers, History of stroke, Hyperlipidemia, Hypertension, Ischemic cardiomyopathy, Macular hemorrhage, Prostate cancer ([place]), Sleep  apnea, and Vision, loss, sudden (02/27/2011).  Surgical History:   Past Surgical History:  Procedure Laterality Date   BIOPSY  10/04/2021   Procedure: BIOPSY;  Surgeon: Lavena Bullion, DO;  Location: [facility];  Service: Gastroenterology;;   CORONARY ANGIOPLASTY WITH STENT PLACEMENT  2012   2 stents   ESOPHAGOGASTRODUODENOSCOPY (EGD) WITH PROPOFOL N/A 10/04/2021   Procedure: ESOPHAGOGASTRODUODENOSCOPY (EGD) WITH PROPOFOL;  Surgeon: Lavena Bullion, DO;  Location: [facility];  Service: Gastroenterology;  Laterality: N/A;   IR FLUORO GUIDE CV LINE RIGHT  09/29/2021   IR US GUIDE VASC ACCESS RIGHT  09/29/2021   RADIOACTIVE SEED IMPLANT N/A 05/08/2017   Procedure: RADIOACTIVE SEED IMPLANT/BRACHYTHERAPY IMPLANT;  Surgeon: Alexis Frock, MD;  [facility];  Service: Urology;  Laterality: N/A;   REFRACTIVE SURGERY  2012   right and left eye/ per pt only left eye   SPACE OAR INSTILLATION N/A 05/08/2017   Procedure: SPACE OAR INSTILLATION;  Surgeon: Alexis Frock, MD;  Location: [facility];  Service: Urology;  Laterality: N/A;    Social History:   reports that he has never smoked. He has never used smokeless tobacco. He reports current alcohol use of about 4.0 standard drinks of alcohol per week. He reports that he does not use drugs.  Family History:  His family history includes Cancer in his father; Crohn's disease in his sister and another family member; Diabetes in his father and mother; Heart attack in his father and mother; Heart disease in his sister; Heart failure in his father and mother; Kidney disease in his brother; Prostate cancer in his father. He was adopted.  Allergies Allergies  Allergen Reactions   Clonidine Derivatives Other (See Comments)  Dizziness Drowsiness     Home Medications  Prior to Admission medications   Medication Sig Start Date End Date Taking? Authorizing Provider  acetaminophen (TYLENOL) 325 MG tablet Take  2 tablets (650 mg total) by mouth every 6 (six) hours as needed for mild pain (or Fever >/= 101). 10/05/21   Lajean Manes, MD  aspirin EC 81 MG tablet Take 1 tablet (81 mg total) by mouth daily. 06/08/20   Harvie Heck, MD  carvedilol (COREG) 25 MG tablet Take 1 tablet (25 mg total) by mouth 2 (two) times daily with a meal. 10/05/21   Lajean Manes, MD  clopidogrel (PLAVIX) 75 MG tablet Take 1 tablet (75 mg total) by mouth daily. 06/14/21   Rehman, Areeg N, DO  dorzolamide-timolol (COSOPT) 22.3-6.8 MG/ML ophthalmic solution Place 1 drop into both eyes daily. 03/25/20   [provider]  Dulaglutide (TRULICITY) 3 MO/2.9UT SOPN Inject 3 mg as directed once a week. 11/08/21   Lajean Manes, MD  Empagliflozin-metFORMIN HCl ER (SYNJARDY XR) 09-998 MG TB24 Take 1 tablet by mouth daily. Take 1 tablet by mouth twice a day.    [provider]  fluticasone (FLONASE) 50 MCG/ACT nasal spray Place 2 sprays into both nostrils daily. 06/08/20   Harvie Heck, MD  hydrochlorothiazide (HYDRODIURIL) 25 MG tablet Take 25 mg by mouth daily.    [provider]  lisinopril (ZESTRIL) 20 MG tablet Take 20 mg by mouth daily.    [provider]  nitroGLYCERIN (NITROSTAT) 0.4 MG SL tablet Place 1 tablet (0.4 mg total) under the tongue every 5 (five) minutes as needed. Patient taking differently: Place 0.4 mg under the tongue every 5 (five) minutes as needed for chest pain. 06/08/20   Harvie Heck, MD  pantoprazole (PROTONIX) 40 MG tablet Take 1 tablet (40 mg total) by mouth 2 (two) times daily. 10/05/21   Lajean Manes, MD  senna-docusate (SENOKOT-S) 8.6-50 MG tablet Take 1 tablet by mouth at bedtime as needed for mild constipation. 10/05/21   Lajean Manes, MD  tamsulosin (FLOMAX) 0.4 MG CAPS capsule Take 1 capsule (0.4 mg total) by mouth daily. 06/14/21   Rehman, Areeg N, DO  Vitamin D, Ergocalciferol, (DRISDOL) 1.25 MG (50000 UNIT) CAPS capsule Take 50,000 Units by mouth every 7 (seven) days.    [provider]    Idamae Schuller, MD Tillie Rung. [facility], PGY-2

## 2021-12-05 NOTE — Consult Note (Signed)
[facility] Renal Consultation Note    Indication for Consultation:  Management of ESRD/hemodialysis UYQ:[id number], Verdis Frederickson, MD  HPI: Kevin Soto is a 60 y.o. male. ESRD on HD T/Th/Sat with a Right Chest HD Catheter Access. Pt HD initially started 09/2021 for AKI and pt report he has been compliant with his outpatient schedule. Patient was hospitalized in May for AKI w/ Crt up to 12. AKI thought be 2/2 ATN. He underwent kidney biopsy on 5/16 which showed moderate diabetic changes and some chronicity w/ 30-40% IFTA. He was discharged as AKI to [facility] and has been compliant with his dialysis. Pt report his last dialysis was Sat 7/15.   Patient has some dementia so some of the history was obtained per chart review.  The patient was having worsening lethargy at home.  He was having significant weakness at the dialysis center on Saturday and once he got home he was having difficulty standing.  The patient was unable to express what was wrong.  Family did note that he had had diarrhea recently.  No blood in his stool.  He was having some incontinence of his diarrhea which was new.  In the emergency department he was found to be febrile and hypotensive with significant leukocytosis.  The patient was given IV fluids and started on broad-spectrum antibiotics.  He had an elevated procalcitonin as well.  Source is unclear at this time.  The patient was sent to the ICU because of hypotension requiring pressors.  The patient now is off of pressors.  Patient states he feels well this morning.  He specifically denies fevers, chills, shortness of breath, chest pain, nausea, vomiting, diarrhea.  The patient's creatinine has been fairly stable around 5.  Urine output almost a liter yesterday.  CXR is unremarkable, blood cultures pending, C-diff negative, pt on IV antibiotics. Nephrology is consulted for management/treatment of pt's AKI and HD need.       Past Medical History:  Diagnosis Date    Anemia    CAD (coronary artery disease)    cath 8/12:  LM ok, mLAD occluded with trivial collats from right AM and dRCA, mDx (small) 80%, RI 25%, tiny branch of OM 90%, mAM 30-40%, RCA sub-totally occluded, then 80%, EF 40%, inf HK.  He underwent PCI with [name] with placement of a Promus DES to the mRCA x 2   Chronic kidney disease    stage 3 per chart   Diabetes mellitus    Dyslipidemia    ED (erectile dysfunction)    Gait instability 02/27/2011   History of alcohol abuse    History of stomach ulcers    History of stroke    evidence of CVA on MRI in past   Hyperlipidemia    Hypertension    Ischemic cardiomyopathy    echo 7/12: EF 40-45%, septal, apical, inf basal HK, mod LVH, mild MR, mild LAE.  EF 55% by echo 2015   Macular hemorrhage    Prostate cancer ([place])    Sleep apnea    no c-pap; wife denies   Vision, loss, sudden 02/27/2011   Past Surgical History:  Procedure Laterality Date   BIOPSY  10/04/2021   Procedure: BIOPSY;  Surgeon: Lavena Bullion, DO;  Location: [facility];  Service: Gastroenterology;;   CORONARY ANGIOPLASTY WITH STENT PLACEMENT  2012   2 stents   ESOPHAGOGASTRODUODENOSCOPY (EGD) WITH PROPOFOL N/A 10/04/2021   Procedure: ESOPHAGOGASTRODUODENOSCOPY (EGD) WITH PROPOFOL;  Surgeon: Lavena Bullion, DO;  Location: [place];  Service: Gastroenterology;  Laterality: N/A;   IR FLUORO GUIDE CV LINE RIGHT  09/29/2021   IR US GUIDE VASC ACCESS RIGHT  09/29/2021   RADIOACTIVE SEED IMPLANT N/A 05/08/2017   Procedure: RADIOACTIVE SEED IMPLANT/BRACHYTHERAPY IMPLANT;  Surgeon: Alexis Frock, MD;  Location: [facility];  Service: Urology;  Laterality: N/A;   REFRACTIVE SURGERY  2012   right and left eye/ per pt only left eye   SPACE OAR INSTILLATION N/A 05/08/2017   Procedure: SPACE OAR INSTILLATION;  Surgeon: Alexis Frock, MD;  Location: [facility];  Service: Urology;  Laterality: N/A;   Family History  Adopted: Yes   Problem Relation Age of Onset   Heart attack Mother        Died 61   Heart failure Mother    Diabetes Mother    Heart attack Father    Heart failure Father    Diabetes Father    Prostate cancer Father    Cancer Father        Prostate   Heart disease Sister        s/p CABG x3 in her mid 72's   Crohn's disease Sister    Crohn's disease Other        niece   Kidney disease Brother    Social History:  reports that he has never smoked. He has never used smokeless tobacco. He reports current alcohol use of about 4.0 standard drinks of alcohol per week. He reports that he does not use drugs. Allergies  Allergen Reactions   Clonidine Derivatives Other (See Comments)    Dizziness Drowsiness    Prior to Admission medications   Medication Sig Start Date End Date Taking? Authorizing Provider  acetaminophen (TYLENOL) 325 MG tablet Take 2 tablets (650 mg total) by mouth every 6 (six) hours as needed for mild pain (or Fever >/= 101). 10/05/21   Lajean Manes, MD  aspirin EC 81 MG tablet Take 1 tablet (81 mg total) by mouth daily. 06/08/20   Harvie Heck, MD  carvedilol (COREG) 25 MG tablet Take 1 tablet (25 mg total) by mouth 2 (two) times daily with a meal. 10/05/21   Lajean Manes, MD  clopidogrel (PLAVIX) 75 MG tablet Take 1 tablet (75 mg total) by mouth daily. 06/14/21   Rehman, Areeg N, DO  dorzolamide-timolol (COSOPT) 22.3-6.8 MG/ML ophthalmic solution Place 1 drop into both eyes daily. 03/25/20   [provider]  Dulaglutide (TRULICITY) 3 TG/2.5WL SOPN Inject 3 mg as directed once a week. 11/08/21   Lajean Manes, MD  Empagliflozin-metFORMIN HCl ER (SYNJARDY XR) 09-998 MG TB24 Take 1 tablet by mouth daily. Take 1 tablet by mouth twice a day.    [provider]  fluticasone (FLONASE) 50 MCG/ACT nasal spray Place 2 sprays into both nostrils daily. 06/08/20   Harvie Heck, MD  hydrochlorothiazide (HYDRODIURIL) 25 MG tablet Take 25 mg by mouth daily.    [provider]   lisinopril (ZESTRIL) 20 MG tablet Take 20 mg by mouth daily.    [provider]  nitroGLYCERIN (NITROSTAT) 0.4 MG SL tablet Place 1 tablet (0.4 mg total) under the tongue every 5 (five) minutes as needed. Patient taking differently: Place 0.4 mg under the tongue every 5 (five) minutes as needed for chest pain. 06/08/20   Harvie Heck, MD  pantoprazole (PROTONIX) 40 MG tablet Take 1 tablet (40 mg total) by mouth 2 (two) times daily. 10/05/21   Lajean Manes, MD  senna-docusate (SENOKOT-S) 8.6-50 MG tablet Take  1 tablet by mouth at bedtime as needed for mild constipation. 10/05/21   Lajean Manes, MD  tamsulosin (FLOMAX) 0.4 MG CAPS capsule Take 1 capsule (0.4 mg total) by mouth daily. 06/14/21   Rehman, Areeg N, DO  Vitamin D, Ergocalciferol, (DRISDOL) 1.25 MG (50000 UNIT) CAPS capsule Take 50,000 Units by mouth every 7 (seven) days.    [provider]   Current Facility-Administered Medications  Medication Dose Route Frequency Provider Last Rate Last Admin   0.9 %  sodium chloride infusion  250 mL Intravenous Continuous Mick Sell, PA-C   Stopped at 12/04/21 0109   acetaminophen (TYLENOL) tablet 650 mg  650 mg Oral Q6H PRN Idamae Schuller, MD       aspirin EC tablet 81 mg  81 mg Oral Daily [name], MD   81 mg at 12/04/21 0921   ceFEPIme (MAXIPIME) 1 g in sodium chloride 0.9 % 100 mL IVPB  1 g Intravenous Q24H Priscella Mann, RPH       Chlorhexidine Gluconate Cloth 2 % PADS 6 each  6 each Topical Daily Rigoberto Noel, MD   6 each at 12/04/21 2030   clopidogrel (PLAVIX) tablet 75 mg  75 mg Oral Daily [name], MD   75 mg at 12/04/21 7412   docusate sodium (COLACE) capsule 100 mg  100 mg Oral BID PRN Mick Sell, PA-C       heparin injection 5,000 Units  5,000 Units Subcutaneous Q8H [name], MD   5,000 Units at 12/05/21 0541   insulin aspart (novoLOG) injection 0-6 Units  0-6 Units Subcutaneous TID WC [name], MD   1 Units at 12/04/21 1728   midodrine  (PROAMATINE) tablet 10 mg  10 mg Oral Q8H Millen, Jessica B, RPH   10 mg at 12/05/21 0303   norepinephrine (LEVOPHED) '4mg'$  in 262m (0.016 mg/mL) premix infusion  2-10 mcg/min Intravenous Titrated KMargaretmary Lombard MD 22.5 mL/hr at 12/05/21 0038 6 mcg/min at 12/05/21 0038   pantoprazole (PROTONIX) EC tablet 40 mg  40 mg Oral BID JVirl Axe MD   40 mg at 12/04/21 2117   polyethylene glycol (MIRALAX / GLYCOLAX) packet 17 g  17 g Oral Daily PRN PMick Sell PA-C       sodium chloride flush (NS) 0.9 % injection 10-40 mL  10-40 mL Intracatheter Q12H SCandee Furbish MD       sodium chloride flush (NS) 0.9 % injection 10-40 mL  10-40 mL Intracatheter PRN SCandee Furbish MD       vancomycin variable dose per unstable renal function (pharmacist dosing)   Does not apply See admin instructions SCandee Furbish MD       Labs: Basic Metabolic Panel: Recent Labs  Lab 12/03/21 1352 12/04/21 0207 12/05/21 0031  NA 135 133* 131*  K 3.3* 3.0* 3.3*  CL 95* 94* 94*  CO2 24 22 21*  GLUCOSE 219* 184* 163*  BUN 29* 35* 47*  CREATININE 4.28* 4.44* 4.96*  CALCIUM 8.8* 8.3* 8.1*  PHOS  --  4.2 3.4   Liver Function Tests: Recent Labs  Lab 12/03/21 1352 12/05/21 0031  AST 19  --   ALT 14  --   ALKPHOS 72  --   BILITOT 1.6*  --   PROT 7.2  --   ALBUMIN 3.0* 2.2*   No results for input(s): "LIPASE", "AMYLASE" in the last 168 hours. No results for input(s): "AMMONIA" in the last 168 hours. CBC: Recent Labs  Lab 12/03/21 1352 12/04/21 0207 12/05/21 0031  WBC 32.2* 34.0* 25.8*  NEUTROABS 27.9* 28.9* 22.9*  HGB 10.4* 9.1* 8.2*  HCT 31.4* 26.3* 23.3*  MCV 88.0 84.6 83.2  PLT 276 226 262   Cardiac Enzymes: No results for input(s): "CKTOTAL", "CKMB", "CKMBINDEX", "TROPONINI" in the last 168 hours. CBG: Recent Labs  Lab 12/04/21 0709 12/04/21 1123 12/04/21 1714 12/04/21 2143 12/05/21 0710  GLUCAP 165* 178* 156* 148* 141*   Iron Studies: No results for input(s): "IRON", "TIBC",  "TRANSFERRIN", "FERRITIN" in the last 72 hours. Studies/Results: DG Abd 1 View  Result Date: 12/03/2021 CLINICAL DATA:  Fecal incontinence.  Diarrhea EXAM: ABDOMEN - 1 VIEW COMPARISON:  CT 09/27/2021 FINDINGS: No bowel dilatation to suggest obstruction. No significant formed stool in the colon. There are vascular calcifications. Brachytherapy seeds in the prostate. No visualized radiopaque calculi. IMPRESSION: Nonobstructive bowel gas pattern. No significant formed stool in the colon. Electronically Signed   By: Keith Rake M.D.   On: 12/03/2021 17:11   DG Chest Port 1 View  Result Date: 12/03/2021 CLINICAL DATA:  Weakness EXAM: PORTABLE CHEST 1 VIEW COMPARISON:  09/27/2021 FINDINGS: Interval placement of right IJ approach hemodialysis catheter with distal tip terminating at the level of the right atrium. The heart size and mediastinal contours are within normal limits. Low lung volumes. No focal airspace consolidation, pleural effusion, or pneumothorax. The visualized skeletal structures are unremarkable. IMPRESSION: Low lung volumes without acute cardiopulmonary findings. Electronically Signed   By: Davina Poke D.O.   On: 12/03/2021 14:09    ROS: All others negative except those listed in HPI.   Physical Exam: Vitals:   12/05/21 0615 12/05/21 0630 12/05/21 0645 12/05/21 0711  BP: (!) 120/53 109/62 (!) 120/54   Pulse: 90 90 91   Resp: 18 18 (!) 21   Temp:    98.1 F (36.7 C)  TempSrc:    Oral  SpO2: 100% 99% 98%   Weight:      Height:         General: Alert, interactive and answer appropriately, NAD Head: NCAT, sclera not icteric  Neck: Supple, No lymphadenopathy Lungs: CTA bilaterally. No wheeze, rales or rhonchi. Breathing is unlabored, on RA Heart: RRR, S1 S2 normal, No murmur  Abdomen: soft, nontender, +BS x4, no guarding, no rebound tenderness, denies diarrhea Lower extremities:no edema, ischemic changes, or open wounds  Neuro: AAOx3. Moves all extremities  spontaneously. Psych:  Responds to questions appropriately with a normal affect. Dialysis Access: Right chest HD Catheter with CDI dsg.   Last Labs: Hgb 8.2, K 3.3, Ca 8.1, P 3.4, Alb 2.2, Creatinine 4.96, BUN 47, GFR 12, RBC 2.80, HCT 23.3  Assessment/Plan:  Shock (Septic vs Hypovolemic)- unknown etiology, symptoms started after pt's last dialysis, leukocytosis wbc 25.8 trending down, blood cultures pending, C-diff negative, pt on IV antibiotics, Vasopressor off.  Avoid nephrotoxic agents. Defer to PCCM  ESRD -  Pt initially started HD 09/2021, baseline creatinine 1.6-2, HD T, Th, Sat and last dialyzed 7/15. This am creatinine 4.96, BUN 47, GFR 12, urine output 900 ml overnight charted. Pt appears euvolemic on assessment and no edema. -  Hold off Tuesday dialysis, continue to monitor output and VS - avoid nephrotoxic and ensure adequate renal perfusion - replace electrolytes as indicated - repeat labs/ BMP in am - address HD needs on daily basis based on assessment, output and lab results.  Hypertension/volume  - Pt BP was initially hypotensive requiring vasopressor. Vasopressor off, BP stable 119/60 MAP  80. - Continue to monitor BP and per PCCM - monitor I&O and weigh daily  Anemia of CKD - Pt on HD, pt is on ESA with dialysis, Hgb 8.2, continue to monitor, check iron panel, transfuse as needed during dialysis.  Nutrition - Continue renal diet, increase po intake.  Tanda Rockers, [facility] 12/05/2021, 8:32 AM    I have personally seen and examined this patient and agree with the assessment/plan as outlined above by [facility]. Information has been updated and changed as needed Marney Setting 12/05/2021 10:35 AM

## 2021-12-05 NOTE — Progress Notes (Signed)
12/05/2021 I saw and evaluated the patient. Discussed with resident and agree with resident's findings and plan as documented in the resident's note.  I have seen and evaluated the patient for shock.  S:  Remains on low dose levophed. Fever curve improved.  O: Blood pressure (!) 120/54, pulse 91, temperature 98.1 F (36.7 C), temperature source Oral, resp. rate (!) 21, height 6' (1.829 m), weight 88.9 kg, SpO2 98 %.   No distress Lungs clear HD catheter CDI Ext warm  WBC improved H/H down slightly Stool studies including c diff neg  A:  Ongoing shock thought mostly septic/hypovolemic, does have history of ICM  Diarrheal illness with neg infectious workup  ESRD on HD  DM2  P:  - Check repeat echo - Start midodrine - Vanc/cefepime for now, once cult neg x 48, can do ceftriaxone vs. Cefepime to complete 7 days although not clear what treating  Patient critically ill due to shock Interventions to address this today pressor titration Risk of deterioration without these interventions is high  I personally spent 32 minutes providing critical care not including any separately billable procedures  Erskine Emery MD [facility] Prefer epic messenger for cross cover needs If after hours, please call E-link

## 2021-12-06 DIAGNOSIS — I120 Hypertensive chronic kidney disease with stage 5 chronic kidney disease or end stage renal disease: Secondary | ICD-10-CM

## 2021-12-06 DIAGNOSIS — E1122 Type 2 diabetes mellitus with diabetic chronic kidney disease: Secondary | ICD-10-CM

## 2021-12-06 DIAGNOSIS — E1149 Type 2 diabetes mellitus with other diabetic neurological complication: Secondary | ICD-10-CM

## 2021-12-06 DIAGNOSIS — E876 Hypokalemia: Secondary | ICD-10-CM

## 2021-12-06 DIAGNOSIS — Z7985 Long-term (current) use of injectable non-insulin antidiabetic drugs: Secondary | ICD-10-CM

## 2021-12-06 LAB — BASIC METABOLIC PANEL
Anion gap: 13 (ref 5–15)
BUN: 53 mg/dL — ABNORMAL HIGH (ref 8–23)
CO2: 21 mmol/L — ABNORMAL LOW (ref 22–32)
Calcium: 8.4 mg/dL — ABNORMAL LOW (ref 8.9–10.3)
Chloride: 98 mmol/L (ref 98–111)
Creatinine, Ser: 5.13 mg/dL — ABNORMAL HIGH (ref 0.61–1.24)
GFR, Estimated: 12 mL/min — ABNORMAL LOW (ref >60)
Glucose, Bld: 192 mg/dL — ABNORMAL HIGH (ref 70–99)
Potassium: 3.4 mmol/L — ABNORMAL LOW (ref 3.5–5.1)
Sodium: 132 mmol/L — ABNORMAL LOW (ref 135–145)

## 2021-12-06 LAB — GLUCOSE, CAPILLARY
Glucose-Capillary: 151 mg/dL — ABNORMAL HIGH (ref 70–99)
Glucose-Capillary: 201 mg/dL — ABNORMAL HIGH (ref 70–99)
Glucose-Capillary: 167 mg/dL — ABNORMAL HIGH (ref 70–99)
Glucose-Capillary: 183 mg/dL — ABNORMAL HIGH (ref 70–99)
Glucose-Capillary: 207 mg/dL — ABNORMAL HIGH (ref 70–99)

## 2021-12-06 LAB — CBC
HCT: 23.7 % — ABNORMAL LOW (ref 39.0–52.0)
Hemoglobin: 8.1 g/dL — ABNORMAL LOW (ref 13.0–17.0)
MCH: 28.7 pg (ref 26.0–34.0)
MCHC: 34.2 g/dL (ref 30.0–36.0)
MCV: 84 fL (ref 80.0–100.0)
Platelets: 255 10*3/uL (ref 150–400)
RBC: 2.82 MIL/uL — ABNORMAL LOW (ref 4.22–5.81)
RDW: 14.6 % (ref 11.5–15.5)
WBC: 21.7 10*3/uL — ABNORMAL HIGH (ref 4.0–10.5)
nRBC: 0 % (ref 0.0–0.2)

## 2021-12-06 LAB — HEPATITIS B SURFACE ANTIBODY,QUALITATIVE: Hep B S Ab: NONREACTIVE

## 2021-12-06 MED ORDER — POTASSIUM CHLORIDE CRYS ER 20 MEQ PO TBCR
20.0000 meq | EXTENDED_RELEASE_TABLET | Freq: Once | ORAL | Status: AC
  Administered 2021-12-06: 20 meq via ORAL
  Filled 2021-12-06: qty 1

## 2021-12-06 MED ORDER — DARBEPOETIN ALFA 100 MCG/0.5ML IJ SOSY
100.0000 ug | PREFILLED_SYRINGE | INTRAMUSCULAR | Status: DC
  Administered 2021-12-07: 100 ug via INTRAVENOUS
  Filled 2021-12-06 (×2): qty 0.5

## 2021-12-06 MED ORDER — CHLORHEXIDINE GLUCONATE CLOTH 2 % EX PADS
6.0000 | MEDICATED_PAD | Freq: Every day | CUTANEOUS | Status: DC
  Administered 2021-12-06 – 2021-12-11 (×5): 6 via TOPICAL

## 2021-12-06 NOTE — Progress Notes (Signed)
Physical Therapy Treatment Patient Details Name: Kevin Soto MRN: [id number] DOB: 1960-07-29 Today's Date: 12/06/2021   History of Present Illness 61 yo male presenting to ED on 7/16 with weakness. PCCM consulted for persistent hypotension. Admitted to ICU. PMH including HTN, CAD, ESRD (HD T, TH, sat), dilated cardiomyopathy, CVA, T2DM.    PT Comments    Patient progressing and able to ambulate on unit this session.  Also slowly but independently donned socks in bed with crossed leg technique.  Niece in the room and supportive.  Feel patient should continue to progress and be able to return home with follow up HHPT at d/c.    Recommendations for follow up therapy are one component of a multi-disciplinary discharge planning process, led by the attending physician.  Recommendations may be updated based on patient status, additional functional criteria and insurance authorization.  Follow Up Recommendations  Home health PT     Assistance Recommended at Discharge Intermittent Supervision/Assistance  Patient can return home with the following A little help with walking and/or transfers;A little help with bathing/dressing/bathroom;Assistance with cooking/housework;Assist for transportation;Help with stairs or ramp for entrance   Equipment Recommendations  None recommended by PT    Recommendations for Other Services       Precautions / Restrictions Precautions Precautions: Fall;Other (comment) Precaution Comments: watch BP     Mobility  Bed Mobility Overal bed mobility: Needs Assistance Bed Mobility: Supine to Sit     Supine to sit: HOB elevated, Supervision     General bed mobility comments: greatly increased time, but able to perform unaided    Transfers Overall transfer level: Needs assistance Equipment used: Rolling walker (2 wheels) Transfers: Sit to/from Stand Sit to Stand: Min assist           General transfer comment: increased time, cues for hand  placement    Ambulation/Gait Ambulation/Gait assistance: Min assist Gait Distance (Feet): 100 Feet Assistive device: Rolling walker (2 wheels) Gait Pattern/deviations: Step-through pattern, Decreased stride length, Shuffle, Trunk flexed       General Gait Details: slow and flexed and effortful, but only min A provided for balance and walker safety especially on turns   Stairs             Wheelchair Mobility    Modified Rankin (Stroke Patients Only)       Balance Overall balance assessment: Needs assistance Sitting-balance support: Feet supported Sitting balance-Leahy Scale: Fair     Standing balance support: Bilateral upper extremity supported Standing balance-Leahy Scale: Poor Standing balance comment: UE support for balance                            Cognition Arousal/Alertness: Awake/alert Behavior During Therapy: WFL for tasks assessed/performed, Flat affect Overall Cognitive Status: Impaired/Different from baseline Area of Impairment: Safety/judgement, Attention, Problem solving, Following commands                   Current Attention Level: Selective   Following Commands: Follows one step commands with increased time, Follows one step commands inconsistently Safety/Judgement: Decreased awareness of safety, Decreased awareness of deficits   Problem Solving: Slow processing, Requires verbal cues          Exercises      General Comments General comments (skin integrity, edema, etc.): BP 109 supine, 99 sitting without c/o symptoms      Pertinent Vitals/Pain Pain Assessment Faces Pain Scale: No hurt    Home Living  Prior Function            PT Goals (current goals can now be found in the care plan section) Progress towards PT goals: Progressing toward goals    Frequency    Min 3X/week      PT Plan Current plan remains appropriate    Co-evaluation              AM-PAC PT  "6 Clicks" Mobility   Outcome Measure  Help needed turning from your back to your side while in a flat bed without using bedrails?: A Little Help needed moving from lying on your back to sitting on the side of a flat bed without using bedrails?: A Little Help needed moving to and from a bed to a chair (including a wheelchair)?: A Little Help needed standing up from a chair using your arms (e.g., wheelchair or bedside chair)?: A Little Help needed to walk in hospital room?: A Little Help needed climbing 3-5 steps with a railing? : Total 6 Click Score: 16    End of Session Equipment Utilized During Treatment: Gait belt Activity Tolerance: Patient tolerated treatment well Patient left: in chair;with call bell/phone within reach;with family/visitor present;with chair alarm set   PT Visit Diagnosis: Muscle weakness (generalized) (M62.81);Other abnormalities of gait and mobility (R26.89);Other symptoms and signs involving the nervous system (R29.898)     Time: 2706-2376 PT Time Calculation (min) (ACUTE ONLY): 24 min  Charges:  $Gait Training: 8-22 mins $Therapeutic Activity: 8-22 mins                     Magda Kiel, PT Acute Rehabilitation Services Office:[phone] 12/06/2021    Reginia Naas 12/06/2021, 4:27 PM

## 2021-12-06 NOTE — Progress Notes (Signed)
Nephrology Follow-Up Consult note   Assessment/Recommendations: Kevin Soto is a/an 61 y.o. male with a past medical history significant for ESRD, admitted for sepsis.     OP order: [facility] TTS, 4hr, F180, bfr 400, dfr 600,4K, 2.25Ca, edw 88.5, mircera 30 q2weeks  Septic/hypovolemic shock: With leukocytosis.  Now off vasopressors.  On midodrine. IV antibiotics per primary team.  CKD 5 likely ESRD: Has been on dialysis since May.  Mild CKD before he started dialysis as AKI.  Continues to have fairly good urine output.  Trying to hold off on hemodialysis to see if he was recovering kidney function but BUN is rising.  Likely ESRD at this point.  Tentative plan for dialysis tomorrow then likely maintain TTS schedule -Plan for dialysis tomorrow unless labs improved -Dialysis through [facility] -Address more definitive access outpatient  Volume Status: Appears euvolemic at this time  Hypokalemia: Potassium 3.4.  Continue to monitor.  No replacement needed  Hyponatremia: Sodium 132 corrects to 133 likely related to free water excess in the setting of significant CKD/ESRD  Anemia due to CKD/acute illness: Hemoglobin 8.1.  No iron given concerns for infection. On mircera 30 outpatient due on 7/20. Will plan to dose aranesp tomorrow with HD.     Recommendations conveyed to primary service.    [facility] 12/06/2021 8:14 AM  ___________________________________________________________  CC: Sepsis  Interval History/Subjective: Patient states he feels well with no complaints.  Creatinine about the same with slightly higher BUN.  Urine output roughly 725 cc.  No longer on pressors   Medications:  Current Facility-Administered Medications  Medication Dose Route Frequency Provider Last Rate Last Admin   0.9 %  sodium chloride infusion  250 mL Intravenous Continuous Mick Sell, PA-C   Stopped at 12/04/21 0109   acetaminophen (TYLENOL) tablet 650 mg  650 mg Oral  Q6H PRN Idamae Schuller, MD       aspirin EC tablet 81 mg  81 mg Oral Daily [name], MD   81 mg at 12/05/21 0840   ceFEPIme (MAXIPIME) 1 g in sodium chloride 0.9 % 100 mL IVPB  1 g Intravenous Q24H Sloan Leiter B, RPH 200 mL/hr at 12/05/21 1831 1 g at 12/05/21 1831   Chlorhexidine Gluconate Cloth 2 % PADS 6 each  6 each Topical Daily Rigoberto Noel, MD   6 each at 12/05/21 2126   clopidogrel (PLAVIX) tablet 75 mg  75 mg Oral Daily [name], MD   75 mg at 12/05/21 0841   docusate sodium (COLACE) capsule 100 mg  100 mg Oral BID PRN Mick Sell, PA-C       heparin injection 5,000 Units  5,000 Units Subcutaneous Q8H [name], MD   5,000 Units at 12/06/21 0512   insulin aspart (novoLOG) injection 0-6 Units  0-6 Units Subcutaneous TID WC [name], MD   1 Units at 12/06/21 0804   midodrine (PROAMATINE) tablet 10 mg  10 mg Oral Q8H Millen, Jessica B, RPH   10 mg at 12/06/21 0301   norepinephrine (LEVOPHED) '4mg'$  in 272m (0.016 mg/mL) premix infusion  2-10 mcg/min Intravenous Titrated KMargaretmary Lombard MD   Stopped at 12/05/21 0724   pantoprazole (PROTONIX) EC tablet 40 mg  40 mg Oral BID JVirl Axe MD   40 mg at 12/05/21 2139   polyethylene glycol (MIRALAX / GLYCOLAX) packet 17 g  17 g Oral Daily PRN PMick Sell PA-C       sodium chloride flush (  NS) 0.9 % injection 10-40 mL  10-40 mL Intracatheter Q12H Candee Furbish, MD   10 mL at 12/05/21 2139   sodium chloride flush (NS) 0.9 % injection 10-40 mL  10-40 mL Intracatheter PRN Candee Furbish, MD          Review of Systems: 10 systems reviewed and negative except per interval history/subjective  Physical Exam: Vitals:   12/06/21 0600 12/06/21 0700  BP: (!) 119/92   Pulse: 89   Resp: 14   Temp:  98.9 F (37.2 C)  SpO2: 98%    Total I/O In: -  Out: 300 [Urine:300]  Intake/Output Summary (Last 24 hours) at 12/06/2021 3382 Last data filed at 12/06/2021 0800 Gross per 24 hour  Intake 335 ml  Output 1025 ml  Net  -690 ml   Constitutional: well-appearing, no acute distress ENMT: ears and nose without scars or lesions, MMM CV: normal rate, no edema Respiratory: Bilateral chest rise, normal work of breathing Gastrointestinal: soft, non-tender, no palpable masses or hernias Skin: no visible lesions or rashes Psych: alert, judgement/insight appropriate, appropriate mood and affect   Test Results I personally reviewed new and old clinical labs and radiology tests Lab Results  Component Value Date   NA 132 (L) 12/06/2021   K 3.4 (L) 12/06/2021   CL 98 12/06/2021   CO2 21 (L) 12/06/2021   BUN 53 (H) 12/06/2021   CREATININE 5.13 (H) 12/06/2021   GFR 71.22 01/08/2011   CALCIUM 8.4 (L) 12/06/2021   ALBUMIN 2.2 (L) 12/05/2021   PHOS 3.4 12/05/2021    CBC Recent Labs  Lab 12/03/21 1352 12/04/21 0207 12/05/21 0031 12/06/21 0107  WBC 32.2* 34.0* 25.8* 21.7*  NEUTROABS 27.9* 28.9* 22.9*  --   HGB 10.4* 9.1* 8.2* 8.1*  HCT 31.4* 26.3* 23.3* 23.7*  MCV 88.0 84.6 83.2 84.0  PLT 276 226 262 255

## 2021-12-06 NOTE — Progress Notes (Signed)
NAME:  Kevin Soto, MRN:  [id number], DOB:  May 18, 1961, LOS: 3 ADMISSION DATE:  12/03/2021  Subjective  Patient evaluated at bedside this AM. Reports he is feeling well, no acute complaints. States that his diarrhea has subsided and he has been eating well. Denies dyspnea, cough, abdominal pain, dysuria, fevers.   Objective   Blood pressure 125/83, pulse 86, temperature 99.4 F (37.4 C), temperature source Oral, resp. rate 20, height 6' (1.829 m), weight 90.4 kg, SpO2 98 %.     Intake/Output Summary (Last 24 hours) at 12/06/2021 1535 Last data filed at 12/06/2021 1400 Gross per 24 hour  Intake 550 ml  Output 1450 ml  Net -900 ml   Filed Weights   12/03/21 2300 12/05/21 0233 12/06/21 0500  Weight: 87.4 kg 88.9 kg 90.4 kg   Physical Exam: General: Resting comfortably in no acute distress CV: Regular rate, rhythm. No murmurs appreciated. Warm extremities. Pulm: Normal work of breathing on room air. Clear to ausculation bilaterally Abdomen: Soft, non-tender, non-distended. Normoactive bowel sounds. MSK: Normal bulk, tone. No peripheral edema. Neuro: Awake, alert, conversing appropriately  Labs       Latest Ref Rng & Units 12/06/2021    1:07 AM 12/05/2021   12:31 AM 12/04/2021    2:07 AM  CBC  WBC 4.0 - 10.5 K/uL 21.7  25.8  34.0   Hemoglobin 13.0 - 17.0 g/dL 8.1  8.2  9.1   Hematocrit 39.0 - 52.0 % 23.7  23.3  26.3   Platelets 150 - 400 K/uL 255  262  226       Latest Ref Rng & Units 12/06/2021    1:07 AM 12/05/2021   12:31 AM 12/04/2021    2:07 AM  BMP  Glucose 70 - 99 mg/dL 192  163  184   BUN 8 - 23 mg/dL 53  47  35   Creatinine 0.61 - 1.24 mg/dL 5.13  4.96  4.44   Sodium 135 - 145 mmol/L 132  131  133   Potassium 3.5 - 5.1 mmol/L 3.4  3.3  3.0   Chloride 98 - 111 mmol/L 98  94  94   CO2 22 - 32 mmol/L '21  21  22   '$ Calcium 8.9 - 10.3 mg/dL 8.4  8.1  8.3     Summary  Kevin Soto is 61yo with end-stage renal disease on TTS dialysis, coronary artery disease,  previous CVA, type II diabetes mellitus admitted 7/16 with signs consistent with sepsis without clear cause, transferred to ICU for vasopressor support, now returning to IMTS.   Assessment & Plan:  Principal Problem:   Sepsis ([place]) Active Problems:   Diabetes mellitus type 2 with neurological manifestations ([place])   Hyperlipidemia due to type 2 diabetes mellitus ([place])   CVA (cerebral vascular accident) ([place])   ESRD (end stage renal disease) on dialysis ([place])   Septic shock ([place])  #Septic shock, resolved #Febrile illness Unclear cause for patient's initial presentation. Was having significant GI losses prior to arrival, possible contributing. Most likely bacterial source given neutrophil predominance. Blood cultures have remained no growth, although noted that these were obtained after initial antibiotics were given. We will stop vancomycin today, continue with cefepime. Pressures have maintained without vasopressor support.  - Continue cefepime daily - Stop vancomycin - Midodrine '10mg'$  three times daily - Repeat cultures if febrile - PT/OT  #ESRD on TTS HD #Hypokalemia Nephrology on board, appreciate their recommendations. No indication for emergent dialysis, patient will continue with regular  schedule tomorrow. K 3.4 today, will encourage PO intake.  - HD tomorrow - Daily BMP - Avoid nephrotoxins  #Type II diabetes mellitus Last A1c 7.3% two months ago. Sugars have been well-controlled, will continue with current regimen.  - SSI  #Hypertension Holding home carvedilol, lisinopril, hydrochlorothiazide in setting of profound hypotension. - Hold home anti-hypertensives  #Previous CVA Stable, no new neurological deficits. - Continue aspirin, clopidogrel daily  Best practice:  DIET: Renal IVF: n/a DVT PPX: heparin BOWEL: n/a CODE: FULL FAM COM: n/a  Sanjuan Dame, MD Internal Medicine Resident PGY-3 PAGER: [phone] 12/06/2021 3:35 PM  If after hours (below),  please contact on-call pager: [phone] 5PM-7AM Monday-Friday 1PM-7AM Saturday-Sunday

## 2021-12-06 NOTE — Progress Notes (Signed)
[facility] Progress Note Patient Name: Kevin Soto DOB: [birth year] MRN: [id number]   Date of Service  12/06/2021  HPI/Events of Note  K+ 3.4, Cr 5.13, GFR 12.  eICU Interventions  KCL 20 meq po x 1 ordered.        [name] [name] 12/06/2021, 2:51 AM

## 2021-12-07 DIAGNOSIS — B1910 Unspecified viral hepatitis B without hepatic coma: Secondary | ICD-10-CM

## 2021-12-07 DIAGNOSIS — I959 Hypotension, unspecified: Secondary | ICD-10-CM

## 2021-12-07 DIAGNOSIS — D649 Anemia, unspecified: Secondary | ICD-10-CM

## 2021-12-07 LAB — BASIC METABOLIC PANEL
Anion gap: 12 (ref 5–15)
BUN: 55 mg/dL — ABNORMAL HIGH (ref 8–23)
CO2: 23 mmol/L (ref 22–32)
Calcium: 8.8 mg/dL — ABNORMAL LOW (ref 8.9–10.3)
Chloride: 100 mmol/L (ref 98–111)
Creatinine, Ser: 5.22 mg/dL — ABNORMAL HIGH (ref 0.61–1.24)
GFR, Estimated: 12 mL/min — ABNORMAL LOW (ref >60)
Glucose, Bld: 149 mg/dL — ABNORMAL HIGH (ref 70–99)
Potassium: 3.3 mmol/L — ABNORMAL LOW (ref 3.5–5.1)
Sodium: 135 mmol/L (ref 135–145)

## 2021-12-07 LAB — HEPATITIS PANEL, ACUTE
HCV Ab: NONREACTIVE
Hep A IgM: NONREACTIVE
Hep B C IgM: NONREACTIVE
Hepatitis B Surface Ag: NONREACTIVE

## 2021-12-07 LAB — HEPATIC FUNCTION PANEL
ALT: 37 U/L (ref 0–44)
AST: 55 U/L — ABNORMAL HIGH (ref 15–41)
Albumin: 2.1 g/dL — ABNORMAL LOW (ref 3.5–5.0)
Alkaline Phosphatase: 114 U/L (ref 38–126)
Bilirubin, Direct: 0.3 mg/dL — ABNORMAL HIGH (ref 0.0–0.2)
Indirect Bilirubin: 0.3 mg/dL (ref 0.3–0.9)
Total Bilirubin: 0.6 mg/dL (ref 0.3–1.2)
Total Protein: 6.8 g/dL (ref 6.5–8.1)

## 2021-12-07 LAB — CBC
HCT: 25.6 % — ABNORMAL LOW (ref 39.0–52.0)
Hemoglobin: 8.5 g/dL — ABNORMAL LOW (ref 13.0–17.0)
MCH: 28.5 pg (ref 26.0–34.0)
MCHC: 33.2 g/dL (ref 30.0–36.0)
MCV: 85.9 fL (ref 80.0–100.0)
Platelets: 281 10*3/uL (ref 150–400)
RBC: 2.98 MIL/uL — ABNORMAL LOW (ref 4.22–5.81)
RDW: 14.6 % (ref 11.5–15.5)
WBC: 18 10*3/uL — ABNORMAL HIGH (ref 4.0–10.5)
nRBC: 0 % (ref 0.0–0.2)

## 2021-12-07 LAB — GLUCOSE, CAPILLARY
Glucose-Capillary: 148 mg/dL — ABNORMAL HIGH (ref 70–99)
Glucose-Capillary: 119 mg/dL — ABNORMAL HIGH (ref 70–99)
Glucose-Capillary: 152 mg/dL — ABNORMAL HIGH (ref 70–99)

## 2021-12-07 LAB — HIV ANTIBODY (ROUTINE TESTING W REFLEX): HIV Screen 4th Generation wRfx: NONREACTIVE

## 2021-12-07 LAB — PROTIME-INR
INR: 1.2 (ref 0.8–1.2)
Prothrombin Time: 15.2 seconds (ref 11.4–15.2)

## 2021-12-07 LAB — HEPATITIS B CORE ANTIBODY, TOTAL: Hep B Core Total Ab: NONREACTIVE

## 2021-12-07 LAB — CORTISOL-AM, BLOOD: Cortisol - AM: 23.5 ug/dL — ABNORMAL HIGH (ref 6.7–22.6)

## 2021-12-07 LAB — HEPATITIS B SURFACE ANTIBODY, QUANTITATIVE: Hep B S AB Quant (Post): <3.1 m[IU]/mL — ABNORMAL LOW (ref >9.9)

## 2021-12-07 LAB — HEPATITIS B SURFACE ANTIGEN: Hepatitis B Surface Ag: REACTIVE — AB

## 2021-12-07 MED ORDER — ALTEPLASE 2 MG IJ SOLR: 2.0000 mg | Freq: Once | INTRAMUSCULAR | Status: DC | PRN

## 2021-12-07 MED ORDER — HEPARIN SODIUM (PORCINE) 1000 UNIT/ML DIALYSIS
1000.0000 [IU] | INTRAMUSCULAR | Status: DC | PRN
Start: 2021-12-07 — End: 2021-12-07
  Administered 2021-12-07: 1000 [IU] via INTRAVENOUS_CENTRAL
  Filled 2021-12-07 (×2): qty 1

## 2021-12-07 NOTE — Progress Notes (Signed)
Dr Joylene Grapes notified pt HBV AG + on 7/18 verified from pt home dialysis clinic charge RN pt received 2nd Hep B vaccine dose on 11/30/21. Will drawn HBV quantitative DNA viral load test as confirmed with lab on treatment.

## 2021-12-07 NOTE — Progress Notes (Signed)
Isolation protocol implemented.

## 2021-12-07 NOTE — Progress Notes (Signed)
Pt receives out-pt HD at [facility] on TTS. Pt arrives at 10:35 for 10:55 chair. Will assist as needed.   Melven Sartorius Renal Navigator [phone]

## 2021-12-07 NOTE — Progress Notes (Signed)
RN walked into the room and saw that patient had removed both of his IV's and he stated that he removed them because they hurt

## 2021-12-07 NOTE — Progress Notes (Signed)
Nephrology Follow-Up Consult note   Assessment/Recommendations: Kevin Soto is a/an 62 y.o. male with a past medical history significant for ESRD, admitted for sepsis.     OP order: [facility] TTS, 4hr, F180, bfr 400, dfr 600,4K, 2.25Ca, edw 88.5, mircera 30 q2weeks  Septic/hypovolemic shock: Unclear source of infection.  Now off vasopressors.  On midodrine. IV antibiotics per primary team.  ESRD: Has been on dialysis since May.  Mild CKD before he started dialysis as AKI.  Continues to have fairly good urine output.  We held off on dialysis for several days but the GFR remains low at 12.  Urine output was pretty good but BUN and creatinine continued to rise.  Likely dependent on dialysis at this point -Dialysis today maintain TTS schedule -Dialysis through [facility] -Address more definitive access outpatient  Volume Status: Appears euvolemic at this time  Hypokalemia: Mild decrease in potassium.  4K bath on dialysis  Hyponatremia: Intermittent and mild.  No concerns  Anemia due to CKD/acute illness: Hemoglobin 8.5.  No iron given concerns for infection. On mircera 30 outpatient due on 7/20. Will plan to dose aranesp today with hemodialysis     Recommendations conveyed to primary service.    [facility] 12/07/2021 8:45 AM  ___________________________________________________________  CC: Sepsis  Interval History/Subjective: Patient feels well with no complaints.  BUN and creatinine slightly higher today.  Urine output good at 1.7 L.  GFR still remains low around 12.  Possibly even lower given recent hospitalizations and likely low muscle mass.  Likely will remain dependent on dialysis.   Medications:  Current Facility-Administered Medications  Medication Dose Route Frequency Provider Last Rate Last Admin   0.9 %  sodium chloride infusion  250 mL Intravenous Continuous Mick Sell, PA-C   Stopped at 12/04/21 0109   acetaminophen (TYLENOL) tablet 650  mg  650 mg Oral Q6H PRN Idamae Schuller, MD       alteplase (CATHFLO ACTIVASE) injection 2 mg  2 mg Intracatheter Once PRN Reesa Chew, MD       aspirin EC tablet 81 mg  81 mg Oral Daily [name], MD   81 mg at 12/06/21 1006   ceFEPIme (MAXIPIME) 1 g in sodium chloride 0.9 % 100 mL IVPB  1 g Intravenous Q24H Sloan Leiter B, RPH 200 mL/hr at 12/06/21 1803 1 g at 12/06/21 1803   Chlorhexidine Gluconate Cloth 2 % PADS 6 each  6 each Topical Daily Rigoberto Noel, MD   6 each at 12/06/21 2200   Chlorhexidine Gluconate Cloth 2 % PADS 6 each  6 each Topical Q0600 Reesa Chew, MD   6 each at 12/07/21 0518   clopidogrel (PLAVIX) tablet 75 mg  75 mg Oral Daily [name], MD   75 mg at 12/06/21 1006   Darbepoetin Alfa (ARANESP) injection 100 mcg  100 mcg Intravenous Q Thu-HD Reesa Chew, MD       heparin injection 1,000 Units  1,000 Units Dialysis PRN Reesa Chew, MD       heparin injection 5,000 Units  5,000 Units Subcutaneous Q8H [name], MD   5,000 Units at 12/07/21 0518   insulin aspart (novoLOG) injection 0-6 Units  0-6 Units Subcutaneous TID WC [name], MD   1 Units at 12/06/21 1746   midodrine (PROAMATINE) tablet 10 mg  10 mg Oral Q8H Millen, Jessica B, RPH   10 mg at 12/07/21 0215   norepinephrine (LEVOPHED) '4mg'$  in 260m (  0.016 mg/mL) premix infusion  2-10 mcg/min Intravenous Titrated Margaretmary Lombard, MD   Stopped at 12/05/21 0724   pantoprazole (PROTONIX) EC tablet 40 mg  40 mg Oral BID [name], MD   40 mg at 12/06/21 2157   sodium chloride flush (NS) 0.9 % injection 10-40 mL  10-40 mL Intracatheter Q12H Candee Furbish, MD   10 mL at 12/06/21 2158   sodium chloride flush (NS) 0.9 % injection 10-40 mL  10-40 mL Intracatheter PRN Candee Furbish, MD          Review of Systems: 10 systems reviewed and negative except per interval history/subjective  Physical Exam: Vitals:   12/07/21 0713 12/07/21 0800  BP:  120/79  Pulse:  93  Resp:  20   Temp: 97.8 F (36.6 C)   SpO2:  96%   No intake/output data recorded.  Intake/Output Summary (Last 24 hours) at 12/07/2021 0845 Last data filed at 12/07/2021 0500 Gross per 24 hour  Intake 490 ml  Output 1351 ml  Net -861 ml   Constitutional: well-appearing, no acute distress ENMT: ears and nose without scars or lesions, MMM CV: normal rate, no edema Respiratory: Bilateral chest rise, normal work of breathing Gastrointestinal: soft, non-tender, no palpable masses or hernias Skin: no visible lesions or rashes Psych: alert, judgement/insight appropriate, appropriate mood and affect   Test Results I personally reviewed new and old clinical labs and radiology tests Lab Results  Component Value Date   NA 135 12/07/2021   K 3.3 (L) 12/07/2021   CL 100 12/07/2021   CO2 23 12/07/2021   BUN 55 (H) 12/07/2021   CREATININE 5.22 (H) 12/07/2021   GFR 71.22 01/08/2011   CALCIUM 8.8 (L) 12/07/2021   ALBUMIN 2.1 (L) 12/07/2021   PHOS 3.4 12/05/2021    CBC Recent Labs  Lab 12/03/21 1352 12/04/21 0207 12/05/21 0031 12/06/21 0107 12/07/21 0117  WBC 32.2* 34.0* 25.8* 21.7* 18.0*  NEUTROABS 27.9* 28.9* 22.9*  --   --   HGB 10.4* 9.1* 8.2* 8.1* 8.5*  HCT 31.4* 26.3* 23.3* 23.7* 25.6*  MCV 88.0 84.6 83.2 84.0 85.9  PLT 276 226 262 255 281

## 2021-12-07 NOTE — Progress Notes (Signed)
OT Cancellation Note  Patient Details Name: Kevin Soto MRN: [id number] DOB: 05-02-1961   Cancelled Treatment:    Reason Eval/Treat Not Completed: Patient at procedure or test/ unavailable. Pt off the floor at HD, OT will follow up as time allows.   [name] 12/07/2021, 3:35 PM

## 2021-12-07 NOTE — Progress Notes (Signed)
Hospital day#4 Subjective:     Overnight Events: none  Patient is interactive, answering appropriately,  feeling well, denies any abdominal pain, nausea, vomiting, lightheadedness, CP, SHOB, or confusion.   Objective:  Vital signs in last 24 hours: Vitals:   12/07/21 1159 12/07/21 1400 12/07/21 1410 12/07/21 1414  BP:  105/63  105/63  Pulse:      Resp:      Temp:    98.5 F (36.9 C)  TempSrc:    Oral  SpO2:      Weight: 89.6 kg  90.5 kg   Height:       Supplemental O2: Room Air SpO2: 100 % Filed Weights   12/07/21 1010 12/07/21 1159 12/07/21 1410  Weight: 89.6 kg 89.6 kg 90.5 kg    Physical Exam:  Constitutional:well-appearing man sitting in bed, eating breakfast, in no acute distress HENT: normocephalic atraumatic, moist mucous membranes Cardiovascular: regular rate and rhythm Pulmonary/Chest: normal work of breathing on room air, lungs clear to auscultation bilaterally. No wheezing Abdominal: normal BS, soft, non-tender, non-distended. MSK: moving with no gross abnormalities. No pitting edema.  Neurological:  Patient A&O to self, place, month, year, and situation.Bed neuro exam today with 4/5 strength and equal to sensation bilaterally in upper and lower extremities.  Skin: warm and dry Psych: pleasant mood and affect.     Intake/Output Summary (Last 24 hours) at 12/07/2021 1616 Last data filed at 12/07/2021 1414 Gross per 24 hour  Intake 240 ml  Output 801.1 ml  Net -561.1 ml   Net IO Since Admission: 1,797 mL [12/07/21 1616]  Pertinent Labs:    Latest Ref Rng & Units 12/07/2021    1:17 AM 12/06/2021    1:07 AM 12/05/2021   12:31 AM  CBC  WBC 4.0 - 10.5 K/uL 18.0  21.7  25.8   Hemoglobin 13.0 - 17.0 g/dL 8.5  8.1  8.2   Hematocrit 39.0 - 52.0 % 25.6  23.7  23.3   Platelets 150 - 400 K/uL 281  255  262        Latest Ref Rng & Units 12/07/2021    6:37 AM 12/07/2021    1:17 AM 12/06/2021    1:07 AM  CMP  Glucose 70 - 99 mg/dL  149  192   BUN 8 -  23 mg/dL  55  53   Creatinine 0.61 - 1.24 mg/dL  5.22  5.13   Sodium 135 - 145 mmol/L  135  132   Potassium 3.5 - 5.1 mmol/L  3.3  3.4   Chloride 98 - 111 mmol/L  100  98   CO2 22 - 32 mmol/L  23  21   Calcium 8.9 - 10.3 mg/dL  8.8  8.4   Total Protein 6.5 - 8.1 g/dL 6.8     Total Bilirubin 0.3 - 1.2 mg/dL 0.6     Alkaline Phos 38 - 126 U/L 114     AST 15 - 41 U/L 55     ALT 0 - 44 U/L 37       Imaging: No results found.  Assessment/Plan:   Principal Problem:   Sepsis ([place]) Active Problems:   Diabetes mellitus type 2 with neurological manifestations ([place])   Hyperlipidemia due to type 2 diabetes mellitus ([place])   CVA (cerebral vascular accident) ([place])   ESRD (end stage renal disease) on dialysis ([place])   Septic shock ([place])   Patient Summary: Kevin Soto is 61yo with end-stage renal disease on TTS dialysis,  coronary artery disease, previous CVA, type II diabetes mellitus admitted 7/16 with signs consistent with sepsis without clear cause, transferred to ICU for vasopressor support, now on day 2 on IMTS   Shock Hypotension Unclear etiology, but patient with hypotension, leukocytosis to WBC 25.8, with neutrophil predominance, diarrhea, and generalized weakness. Patient transferred to ICU for pressure management, initially placed on broad spectrum IV antibiotics. Blood cultures still pending, no growth to date, though they were obtained after IV antibiotic initiation. GI panel and c diff testing, negative. MRSA negative. Now on IMTS service. Of note, during 09/2021 hospitalization, patient was noted to have mild bilateral non-specific perinephric stranding with no fluid collections. Patient was not CT scanned 2/2 unstable vitals signs. Will consider re-scanning the patient if clinical picture worsens.  Given patient's low BP this hospitalization despite fluid resuscitation and adequate PO  intake, considered AM cortisol for adrenal insufficiency work up; 7/20 23.5, ruling out AI as  potential cause. Patient continues asymptomatic, alert and oriented, tolerating PO, WBC 18, on empiric cefepime, and on TID midodrine for sustained low blood pressures, improving clinically.  - Continue cefepime daily - Midodrine 10 mg TID - Repeat cultures if febrile - Consider CT scan of abdomen without contrast  ESRD Patient on HD through R upper chest catheter T/TH/S since last hospital admission in 09/2021 for AKI with Cr of 12. At the time, patient with extensive work up given progression of disease. AKI was thought to be 2/2 to ATN. Patient with negative ANA, ANCA, normal C3, C4 levels, unremarkable K/Lambda rations, SPEP, and nonreactive Hep B and C serologies. Kidney biopsy was significant for myoglobin casts, 40%  interstitial fibrosis and tubular atrophy and diabetic glomerulopathy, Class IIb. The acute myoglobin casts were thought to be secondary to statin therapy prior to 09/2021 admission. Patient initially receiving HD to help kidneys regain function. Nephrology is following, appreciate recommendations. Patient continues with good  UOP; off dialysis since 7/15. Patient restarted on HD today given rising BUN and GFR (12). Remains with good urine output overnight. Euvolemic today. Patient continues to have low blood pressures.  -HD today -Monitor BMP -Monitor and replace electrolytes  Positive HBsAg Patient with positive HBsAg 7/18. Notified Dr. Rozanna Boer office confirmed patient received 2nd Hep B vaccine dose on 11/30/21. Repeat Hep B surface antigen on 7/20, non reactive. Transient positive Hep B Ag, likely in the setting of recent vaccination. Will follow up with panel below. -Hep B core total antibody, non reactive -Hep B e Ag, pending -Hep B e antibody, pending -Hep B DNA PCR, pending  Type II diabetes mellitus Last A1c 7.3% two months ago. Sugars have been well-controlled, will continue with current regimen.  - SSI  Anemia Likely in the setting of kidney disease. Patient  received ESA with dialysis. Hgb today 8.5. - Continue to monitor  Hypertension Holding home carvedilol, lisinopril, HCTZ in setting of hypotension  CVA history Patient with No acute neurological symptoms.  Patient A&O to self, place, month, year, and situation.Bed neuro exam today with 4/5 strength and equal to sensation bilaterally in upper and lower extremities.  - PT/OT assessment and recommendation  Diet: Renal VTE: Heparin Code: Full PT/OT recs: Home Health, none. Family Update:   Dispo: Anticipated discharge to Home in 3 days pending clinical improvement.   Romana Juniper, MD Internal Medicine Resident PGY-1 Please contact the on call pager after 5 pm and on weekends at [phone].

## 2021-12-08 DIAGNOSIS — N186 End stage renal disease: Secondary | ICD-10-CM | POA: Diagnosis not present

## 2021-12-08 DIAGNOSIS — Z992 Dependence on renal dialysis: Secondary | ICD-10-CM | POA: Diagnosis not present

## 2021-12-08 LAB — CULTURE, BLOOD (ROUTINE X 2)
Culture: NO GROWTH
Culture: NO GROWTH
Special Requests: ADEQUATE

## 2021-12-08 LAB — GLUCOSE, CAPILLARY
Glucose-Capillary: 108 mg/dL — ABNORMAL HIGH (ref 70–99)
Glucose-Capillary: 203 mg/dL — ABNORMAL HIGH (ref 70–99)
Glucose-Capillary: 136 mg/dL — ABNORMAL HIGH (ref 70–99)
Glucose-Capillary: 218 mg/dL — ABNORMAL HIGH (ref 70–99)

## 2021-12-08 LAB — BASIC METABOLIC PANEL
Anion gap: 11 (ref 5–15)
BUN: 25 mg/dL — ABNORMAL HIGH (ref 8–23)
CO2: 24 mmol/L (ref 22–32)
Calcium: 8.1 mg/dL — ABNORMAL LOW (ref 8.9–10.3)
Chloride: 102 mmol/L (ref 98–111)
Creatinine, Ser: 2.8 mg/dL — ABNORMAL HIGH (ref 0.61–1.24)
GFR, Estimated: 25 mL/min — ABNORMAL LOW (ref >60)
Glucose, Bld: 126 mg/dL — ABNORMAL HIGH (ref 70–99)
Potassium: 3.3 mmol/L — ABNORMAL LOW (ref 3.5–5.1)
Sodium: 137 mmol/L (ref 135–145)

## 2021-12-08 LAB — CBC
HCT: 21.7 % — ABNORMAL LOW (ref 39.0–52.0)
Hemoglobin: 7.6 g/dL — ABNORMAL LOW (ref 13.0–17.0)
MCH: 28.7 pg (ref 26.0–34.0)
MCHC: 35 g/dL (ref 30.0–36.0)
MCV: 81.9 fL (ref 80.0–100.0)
Platelets: 290 10*3/uL (ref 150–400)
RBC: 2.65 MIL/uL — ABNORMAL LOW (ref 4.22–5.81)
RDW: 14.8 % (ref 11.5–15.5)
WBC: 14.9 10*3/uL — ABNORMAL HIGH (ref 4.0–10.5)
nRBC: 0 % (ref 0.0–0.2)

## 2021-12-08 LAB — HEPATITIS B DNA, ULTRAQUANTITATIVE, PCR
HBV DNA SERPL PCR-ACNC: NOT DETECTED IU/mL
HBV DNA SERPL PCR-LOG IU: UNDETERMINED log10 IU/mL

## 2021-12-08 LAB — HEPATITIS B E ANTIBODY: Hep B E Ab: NEGATIVE

## 2021-12-08 MED ORDER — SODIUM CHLORIDE 0.9 % IV SOLN
1.0000 g | INTRAVENOUS | Status: AC
  Administered 2021-12-08: 1 g via INTRAVENOUS
  Filled 2021-12-08: qty 10

## 2021-12-08 MED ORDER — POTASSIUM CHLORIDE CRYS ER 20 MEQ PO TBCR
20.0000 meq | EXTENDED_RELEASE_TABLET | Freq: Once | ORAL | Status: AC
  Administered 2021-12-08: 20 meq via ORAL
  Filled 2021-12-08: qty 1

## 2021-12-08 NOTE — TOC Initial Note (Signed)
Transition of Care [facility]) - Initial/Assessment Note    Patient Details  Name: Kevin Soto MRN: [id number] Date of Birth: 05-07-1961  Transition of Care [facility]) CM/SW Contact:    Coralee Pesa, [place] Phone Number: 12/08/2021, 3:27 PM  Clinical Narrative:                 CSW spoke with pt and advised him of SNF recommendation. Pt is agreeable and states he has been to [place] before and would like to return. Pt requests CSW contact sister [name], for further information.   CSW spoke with [name] who confirmed interest in SNF and [place], but clarified, pt's brother has been to [place] not the pt. Pt lives with sister at baseline and was independent prior to admission. She stated family would transport pt to facility. CSW will send out referral's and TOC will continue to follow for DC needs.  Expected Discharge Plan: Skilled Nursing Facility Barriers to Discharge: Insurance Authorization, SNF Pending bed offer, Continued Medical Work up   Patient Goals and CMS Choice Patient states their goals for this hospitalization and ongoing recovery are:: Pt states his goal is to be able to go home. CMS Medicare.gov Compare Post Acute Care list provided to:: Patient Choice offered to / list presented to : Patient, Sibling  Expected Discharge Plan and Services Expected Discharge Plan: [place] Acute Care Choice: [place] arrangements for the past 2 months: Single Family Home                                      Prior Living Arrangements/Services Living arrangements for the past 2 months: Single Family Home Lives with:: Self, Siblings Patient language and need for interpreter reviewed:: Yes Do you feel safe going back to the place where you live?: Yes      Need for Family Participation in Patient Care: Yes (Comment) Care giver support system in place?: Yes (comment)   Criminal Activity/Legal Involvement Pertinent to Current  Situation/Hospitalization: No - Comment as needed  Activities of Daily Living      Permission Sought/Granted Permission sought to share information with : Family Supports Permission granted to share information with : Yes, Verbal Permission Granted  Share Information with NAME: Ash Mcelwain     Permission granted to share info w Relationship: Sister  Permission granted to share info w Contact Information: [phone]  Emotional Assessment Appearance:: Appears stated age Attitude/Demeanor/Rapport: Engaged Affect (typically observed): Appropriate Orientation: : Oriented to Self, Oriented to Place, Oriented to  Time Alcohol / Substance Use: Not Applicable Psych Involvement: No (comment)  Admission diagnosis:  Septic shock ([place]) [A41.9, R65.21] Sepsis ([place]) [A41.9] Sepsis without acute organ dysfunction, due to unspecified organism [facility]) [A41.9] Patient Active Problem List   Diagnosis Date Noted   Sepsis ([place]) 12/03/2021   Septic shock ([place]) 12/03/2021   ESRD (end stage renal disease) on dialysis ([place]) 11/08/2021   Duodenitis    Gastroesophageal reflux disease with esophagitis without hemorrhage    Hiatal hernia    Acute renal failure ([place])    Uremia 09/27/2021   Type 2 diabetes mellitus with complication, without long-term current use of insulin ([place]) 07/21/2020   Dyslipidemia 07/21/2020   Memory change 05/07/2018   Chronic nasal congestion 11/12/2017   Malignant neoplasm of prostate ([place]) 03/11/2017   CAD (coronary artery disease)    Erectile dysfunction 05/08/2016  Advanced care planning/counseling discussion 01/31/2016   CKD (chronic kidney disease) stage 4, GFR 15-29 ml/min (HCC) 08/18/2014   Normocytic anemia 06/24/2012   Preventative health care 06/24/2012   Unsteady gait when walking 02/27/2011   Hyperlipidemia due to type 2 diabetes mellitus ([place]) 02/16/2011   Atherosclerotic heart disease of native coronary artery with unspecified angina pectoris ([place])  01/26/2011   Diabetes mellitus type 2 with neurological manifestations ([place]) 12/15/2010   Dilated cardiomyopathy ([place])    Hypertension associated with diabetes [facility])    CVA (cerebral vascular accident) ([place]) 11/28/2010   PCP:  Romana Juniper, MD Pharmacy:   [facility] ([place], [place] [address] [place] [address] [place] [address] Phone: [phone] Fax: [phone]  [facility] ([facility] ) - [place], [place] [address] Phone: [phone] Fax: [phone]  [facility] [address] [address] Phone: [phone] Fax: [phone]  [id number] [id number]: [phone]: [phone]    Social Determinants of Health (SDOH) Interventions    Readmission Risk Interventions     No data to display

## 2021-12-08 NOTE — Progress Notes (Signed)
Occupational Therapy Treatment Patient Details Name: Kevin Soto MRN: [id number] DOB: [birth year] Today's Date: 12/08/2021   History of present illness 61 yo male presenting to ED on 7/16 with weakness. PCCM consulted for persistent hypotension. Admitted to ICU. PMH including HTN, CAD, ESRD (HD T, TH, sat), dilated cardiomyopathy, CVA, T2DM.   OT comments  This 61 yo male admitted with above seen today to focus on ADLs and transfers. Pt having more difficulty getting in and out of the bed than on previous notes as well as increased A needed for transfers and ADLs. He will continue to benefit from acute OT with follow up no changed to SNF due to his increased needs and sister's decreased ability to care for him at his current level. We will continue to follow.   Recommendations for follow up therapy are one component of a multi-disciplinary discharge planning process, led by the attending physician.  Recommendations may be updated based on patient status, additional functional criteria and insurance authorization.    Follow Up Recommendations  Skilled nursing-short term rehab (<3 hours/day)    Assistance Recommended at Discharge Frequent or constant Supervision/Assistance  Patient can return home with the following  A lot of help with walking and/or transfers;A lot of help with bathing/dressing/bathroom;Assistance with cooking/housework;Assist for transportation;Direct supervision/assist for financial management;Direct supervision/assist for medications management;Help with stairs or ramp for entrance   Equipment Recommendations  None recommended by OT       Precautions / Restrictions Precautions Precautions: Fall Precaution Comments: watch BP Restrictions Weight Bearing Restrictions: No       Mobility Bed Mobility Overal bed mobility: Needs Assistance Bed Mobility: Supine to Sit, Sit to Supine     Supine to sit: Min guard, HOB elevated Sit to supine: Mod assist, HOB  elevated   General bed mobility comments: increased time and effort today for both in and OOB    Transfers Overall transfer level: Needs assistance Equipment used: Rolling walker (2 wheels) Transfers: Sit to/from Stand Sit to Stand: Mod assist, From elevated surface           General transfer comment: increased time, cues for hand placement and pt could not stand fully upright.     Balance Overall balance assessment: Needs assistance Sitting-balance support: No upper extremity supported, Feet supported Sitting balance-Leahy Scale: Fair     Standing balance support: Bilateral upper extremity supported, Reliant on assistive device for balance Standing balance-Leahy Scale: Poor                             ADL either performed or assessed with clinical judgement   ADL Overall ADL's : Needs assistance/impaired                       Lower Body Dressing Details (indicate cue type and reason): Can cross legs while seated to get to feet   Toilet Transfer Details (indicate cue type and reason): Mod A sit<>stand, min A for ambulation with RW with VCs to stay close to [employer]- Clothing Manipulation and Hygiene: Moderate assistance;Sit to/from stand              Extremity/Trunk Assessment Upper Extremity Assessment Upper Extremity Assessment: Generalized weakness            Vision Patient Visual Report: No change from baseline            Cognition Arousal/Alertness: Awake/alert Behavior During Therapy: Flat affect Overall Cognitive  Status: Impaired/Different from baseline Area of Impairment: Following commands, Safety/judgement, Problem solving                       Following Commands: Follows one step commands with increased time Safety/Judgement: Decreased awareness of safety, Decreased awareness of deficits   Problem Solving: Slow processing, Requires verbal cues                General Comments VSS    Pertinent  Vitals/ Pain       Pain Assessment Pain Assessment: No/denies pain Faces Pain Scale: No hurt         Frequency  Min 2X/week        Progress Toward Goals  OT Goals(current goals can now be found in the care plan section)  Progress towards OT goals: Not progressing toward goals - comment (not moving as well tody)  Acute Rehab OT Goals Patient Stated Goal: to go home, but is agreeable to rehab first OT Goal Formulation: With patient Time For Goal Achievement: 12/18/21 Potential to Achieve Goals: Good  Plan Discharge plan needs to be updated       AM-PAC OT "6 Clicks" Daily Activity     Outcome Measure   Help from another person eating meals?: A Little (setup) Help from another person taking care of personal grooming?: A Little Help from another person toileting, which includes using toliet, bedpan, or urinal?: A Lot Help from another person bathing (including washing, rinsing, drying)?: A Lot Help from another person to put on and taking off regular upper body clothing?: A Little Help from another person to put on and taking off regular lower body clothing?: A Lot 6 Click Score: 15    End of Session Equipment Utilized During Treatment: Gait belt;Rolling walker (2 wheels)  OT Visit Diagnosis: Unsteadiness on feet (R26.81);Other abnormalities of gait and mobility (R26.89);Muscle weakness (generalized) (M62.81)   Activity Tolerance Patient tolerated treatment well (but fatigued today)   Patient Left in bed;with call bell/phone within reach;with bed alarm set   Nurse Communication  (mepilex sacral pad removed, needs a new one per chat text)        Time: 2841-3244 OT Time Calculation (min): 34 min  Charges: OT General Charges $OT Visit: 1 Visit OT Treatments $Self Care/Home Management : 23-37 mins  [place], OTR/L Acute Rehab Services [facility] [phone] Office [phone]    Almon Register 12/08/2021, 3:03 PM

## 2021-12-08 NOTE — NC FL2 (Signed)
[place] LEVEL OF CARE SCREENING TOOL     IDENTIFICATION  Patient Name: Kevin Soto Birthdate: 06-03-1960 Sex: male Admission Date (Current Location): 12/03/2021  [facility] and [place] Number:  Herbalist and Address:  [facility], [place] [address] [place], [place] [address]      Provider Number: [id number]  Attending Physician Name and Address:  Velna Ochs, MD  Relative Name and Phone Number:  [name] [phone]    Current Level of Care: Hospital Recommended Level of Care: [place] Prior Approval Number:    Date Approved/Denied:   PASRR Number: [id number]  Discharge Plan: SNF    Current Diagnoses: Patient Active Problem List   Diagnosis Date Noted   Sepsis ([place]) 12/03/2021   Septic shock ([place]) 12/03/2021   ESRD (end stage renal disease) on dialysis ([place]) 11/08/2021   Duodenitis    Gastroesophageal reflux disease with esophagitis without hemorrhage    Hiatal hernia    Acute renal failure ([place])    Uremia 09/27/2021   Type 2 diabetes mellitus with complication, without long-term current use of insulin ([place]) 07/21/2020   Dyslipidemia 07/21/2020   Memory change 05/07/2018   Chronic nasal congestion 11/12/2017   Malignant neoplasm of prostate ([place]) 03/11/2017   CAD (coronary artery disease)    Erectile dysfunction 05/08/2016   Advanced care planning/counseling discussion 01/31/2016   CKD (chronic kidney disease) stage 4, GFR 15-29 ml/min (HCC) 08/18/2014   Normocytic anemia 06/24/2012   Preventative health care 06/24/2012   Unsteady gait when walking 02/27/2011   Hyperlipidemia due to type 2 diabetes mellitus ([place]) 02/16/2011   Atherosclerotic heart disease of native coronary artery with unspecified angina pectoris ([place]) 01/26/2011   Diabetes mellitus type 2 with neurological manifestations ([place]) 12/15/2010   Dilated cardiomyopathy ([place])    Hypertension associated with diabetes ([place])    CVA  (cerebral vascular accident) ([place]) 11/28/2010    Orientation RESPIRATION BLADDER Height & Weight     Self, Time, Situation, Place  Normal Incontinent, External catheter Weight: 199 lb 4.7 oz (90.4 kg) Height:  6' (182.9 cm)  BEHAVIORAL SYMPTOMS/MOOD NEUROLOGICAL BOWEL NUTRITION STATUS      Incontinent Diet (See DC summary)  AMBULATORY STATUS COMMUNICATION OF NEEDS Skin   Extensive Assist Verbally Skin abrasions (Bilateral Sacrum Redness)                       Personal Care Assistance Level of Assistance  Bathing, Feeding, Dressing Bathing Assistance: Maximum assistance Feeding assistance: Limited assistance Dressing Assistance: Maximum assistance     Functional Limitations Info  Sight, Hearing, Speech Sight Info: Adequate Hearing Info: Adequate Speech Info: Adequate    SPECIAL CARE FACTORS FREQUENCY  PT (By licensed PT), OT (By licensed OT)     PT Frequency: 5x week OT Frequency: 5x week            Contractures Contractures Info: Not present    Additional Factors Info  Code Status, Allergies, Insulin Sliding Scale Code Status Info: Full Allergies Info: Clonidine   Insulin Sliding Scale Info: Insulin Aspart (Novolog) 0-6 U 3x daily w/ meals       Current Medications (12/08/2021):  This is the current hospital active medication list Current Facility-Administered Medications  Medication Dose Route Frequency Provider Last Rate Last Admin   0.9 %  sodium chloride infusion  250 mL Intravenous Continuous Mick Sell, PA-C   Stopped at 12/04/21 0109   acetaminophen (TYLENOL) tablet 650  mg  650 mg Oral Q6H PRN Idamae Schuller, MD       aspirin EC tablet 81 mg  81 mg Oral Daily [name], MD   81 mg at 12/08/21 9629   ceFEPIme (MAXIPIME) 1 g in sodium chloride 0.9 % 100 mL IVPB  1 g Intravenous Q24H [name], MD       Chlorhexidine Gluconate Cloth 2 % PADS 6 each  6 each Topical Daily Rigoberto Noel, MD   6 each at 12/08/21 0931   Chlorhexidine Gluconate  Cloth 2 % PADS 6 each  6 each Topical Q0600 Reesa Chew, MD   6 each at 12/08/21 0507   clopidogrel (PLAVIX) tablet 75 mg  75 mg Oral Daily [name], MD   75 mg at 12/08/21 5284   Darbepoetin Alfa (ARANESP) injection 100 mcg  100 mcg Intravenous Q Thu-HD Reesa Chew, MD   100 mcg at 12/07/21 1250   heparin injection 5,000 Units  5,000 Units Subcutaneous Q8H [name], MD   5,000 Units at 12/08/21 1315   insulin aspart (novoLOG) injection 0-6 Units  0-6 Units Subcutaneous TID WC [name], MD   2 Units at 12/08/21 1314   midodrine (PROAMATINE) tablet 10 mg  10 mg Oral Q8H [place], Jakye Mullens B, RPH   10 mg at 12/08/21 0928   pantoprazole (PROTONIX) EC tablet 40 mg  40 mg Oral BID [name], MD   40 mg at 12/08/21 0929   sodium chloride flush (NS) 0.9 % injection 10-40 mL  10-40 mL Intracatheter Q12H Candee Furbish, MD   10 mL at 12/08/21 0931   sodium chloride flush (NS) 0.9 % injection 10-40 mL  10-40 mL Intracatheter PRN Candee Furbish, MD         Discharge Medications: Please see discharge summary for a list of discharge medications.  Relevant Imaging Results:  Relevant Lab Results:   Additional Information SS# [place], [place]

## 2021-12-08 NOTE — Progress Notes (Addendum)
Hospital day#5 Subjective:   Overnight Events: None  Comfortably laying in bed, alert, conversational, and eating breakfast. BM this morning. Denies chills, pain, nausea, vomiting, lightheadedness, CP, SOB.   Objective:  Vital signs in last 24 hours: Vitals:   12/07/21 2113 12/08/21 0428 12/08/21 0546 12/08/21 0809  BP: 95/69  (!) 99/55 110/66  Pulse: 85  67 67  Resp: '18  17 16  '$ Temp: 99.2 F (37.3 C)   98.6 F (37 C)  TempSrc: Oral   Oral  SpO2: 100%  100% 100%  Weight:  90.4 kg    Height:       Supplemental O2: Room Air SpO2: 100 % Filed Weights   12/07/21 1159 12/07/21 1410 12/08/21 0428  Weight: 89.6 kg 90.5 kg 90.4 kg    Physical Exam:  Constitutional:ill-appearing person sitting in chair, in no acute distress HENT: normocephalic atraumatic, moist mucous membranes Neck: supple Cardiovascular: regular rate and rhythm, no m/r/g Pulmonary/Chest: normal work of breathing on room air, lungs clear to auscultation bilaterally. No wheezing Abdominal: normal  BS, soft, non-tender, non-distended. **fluid wave. MSK: normal bulk  No Pitting edema.  No asterixis Neurological: alert & oriented x 3, 5/5 strength in bilateral upper and lower extremities Skin: warm and dry Psych: pleasant mood and affect    Intake/Output Summary (Last 24 hours) at 12/08/2021 1427 Last data filed at 12/08/2021 0500 Gross per 24 hour  Intake 240 ml  Output 850 ml  Net -610 ml   Net IO Since Admission: 1,187 mL [12/08/21 1427]  Pertinent Labs:    Latest Ref Rng & Units 12/08/2021    4:14 AM 12/07/2021    1:17 AM 12/06/2021    1:07 AM  CBC  WBC 4.0 - 10.5 K/uL 14.9  18.0  21.7   Hemoglobin 13.0 - 17.0 g/dL 7.6  8.5  8.1   Hematocrit 39.0 - 52.0 % 21.7  25.6  23.7   Platelets 150 - 400 K/uL 290  281  255        Latest Ref Rng & Units 12/08/2021    4:14 AM 12/07/2021    6:37 AM 12/07/2021    1:17 AM  CMP  Glucose 70 - 99 mg/dL 126   149   BUN 8 - 23 mg/dL 25   55   Creatinine  0.61 - 1.24 mg/dL 2.80   5.22   Sodium 135 - 145 mmol/L 137   135   Potassium 3.5 - 5.1 mmol/L 3.3   3.3   Chloride 98 - 111 mmol/L 102   100   CO2 22 - 32 mmol/L 24   23   Calcium 8.9 - 10.3 mg/dL 8.1   8.8   Total Protein 6.5 - 8.1 g/dL  6.8    Total Bilirubin 0.3 - 1.2 mg/dL  0.6    Alkaline Phos 38 - 126 U/L  114    AST 15 - 41 U/L  55    ALT 0 - 44 U/L  37      Imaging: No results found.  Assessment/Plan:   Principal Problem:   Sepsis ([place]) Active Problems:   Diabetes mellitus type 2 with neurological manifestations ([place])   Hyperlipidemia due to type 2 diabetes mellitus ([place])   CVA (cerebral vascular accident) ([place])   ESRD (end stage renal disease) on dialysis ([place])   Septic shock [facility])   Patient Summary: Patient Summary: Kevin Soto is 61yo with end-stage renal disease on TTS dialysis, coronary artery disease, previous CVA,  type II diabetes mellitus admitted 7/16 with signs consistent with sepsis without clear cause, transferred to ICU for vasopressor support, now on day 3 on IMTS service and stable for discharge awaiting SNF placement   Shock, resolved Hypotension Unclear etiology, but patient with hypotension, leukocytosis to WBC 25.8, with neutrophil predominance, diarrhea, and generalized weakness. Patient transferred to ICU for pressure management, initially placed on broad spectrum IV antibiotics. Blood cultures still pending, no growth to date, though they were obtained after IV antibiotic initiation. GI panel and c diff testing, negative. MRSA negative. Patient continues to be asymptomatic, alert and oriented, tolerating PO, WBC 14.9 today, on day 5 of empiric cefepime, and on TID midodrine for sustained low blood pressures, continue to improve clinically. Will d/c cefepime today after 5 day course with close follow up in the outpatient setting. - Today: last day on cefepime. Will discontinue after today's dose - Midodrine 10 mg TID - Repeat cultures if  febrile - Consider CT scan of abdomen without contrast if febrile   ESRD Patient on HD through R upper chest catheter T/TH/S since last hospital admission in 09/2021 for AKI with Cr of 12. Kidney biopsy was significant for myoglobin casts, 40%  interstitial fibrosis and tubular atrophy and diabetic glomerulopathy, Class IIb. The acute myoglobin casts were thought to be secondary to statin therapy prior to 09/2021 admission. Patient initially receiving HD to help kidneys regain function. Nephrology is following, appreciate recommendations. Patient restarted on HD 7/20 given rising BUN, Cr today 2.8, GFR 25. Patient will need HD tomorrow. -HD tomorrow -Monitor BMP -Monitor and replace electrolytes   Positive HBsAg, repeat negative Patient with positive HBsAg 7/18. Notified Dr. Rozanna Boer office confirmed patient received 2nd Hep B vaccine dose on 11/30/21. Repeat Hep B surface antigen on 7/20, non reactive. Transient positive Hep B Ag, likely in the setting of recent vaccination. Will follow up with panel below. -Hep B core total antibody, non reactive -Hep B e Ag, pending -Hep B e antibody, negative 7/21 -Hep B DNA PCR, pending   Type II diabetes mellitus Last A1c 7.3% two months ago. Sugars have been well-controlled, will continue with current regimen.  - SSI   Anemia Likely in the setting of kidney disease. Patient received ESA with dialysis. Hgb today 8.5. - Continue to monitor   Hypertension Holding home carvedilol, lisinopril, HCTZ in setting of hypotension - will continue holding upon discharge given persistent low blood pressures   CVA history Patient with No acute neurological symptoms.  Patient A&O to self, place, month, year, and situation.Bed neuro exam today with 4/5 strength and equal to sensation bilaterally in upper and lower extremities.  - PT/OT assessment and recommendation -Continue on ASA   Diet: Renal VTE: Heparin Code: Full PT/OT recs: OT now recommending  SNF Family Update: Patient's sister in room     Dispo: Anticipated discharge to Skilled nursing facility in 2 days pending evaluation and admission.   Romana Juniper, MD Internal Medicine Resident PGY-1 Please contact the on call pager after 5 pm and on weekends at [phone].

## 2021-12-08 NOTE — Progress Notes (Addendum)
Physical Therapy Treatment Patient Details Name: Kevin Soto MRN: [id number] DOB: 1960/10/02 Today's Date: 12/08/2021   History of Present Illness 61 yo male presenting to ED on 7/16 with weakness. PCCM consulted for persistent hypotension. Admitted to ICU. PMH including HTN, CAD, ESRD (HD T, TH, sat), dilated cardiomyopathy, CVA, T2DM.    PT Comments    Pt admitted with above diagnosis. Pt was unable to ambulate today but did stand at EOB and take a few steps to [facility].  Pt appeared to be unable to motor plan movements today.  Will need continued PT and updated d/c plan to SNF as pt has progressed slower than anticipated and sister cannot provide assist. Also updated frequency.   Pt currently with functional limitations due to balance and endurance deficits. Pt will benefit from skilled PT to increase their independence and safety with mobility to allow discharge to the venue listed below.      Recommendations for follow up therapy are one component of a multi-disciplinary discharge planning process, led by the attending physician.  Recommendations may be updated based on patient status, additional functional criteria and insurance authorization.  Follow Up Recommendations  Skilled nursing-short term rehab (<3 hours/day) Can patient physically be transported by private vehicle: Yes   Assistance Recommended at Discharge Intermittent Supervision/Assistance  Patient can return home with the following A little help with walking and/or transfers;A little help with bathing/dressing/bathroom;Assistance with cooking/housework;Assist for transportation;Help with stairs or ramp for entrance   Equipment Recommendations  None recommended by PT    Recommendations for Other Services       Precautions / Restrictions Precautions Precautions: Fall;Other (comment) Precaution Comments: watch BP Restrictions Weight Bearing Restrictions: No     Mobility  Bed Mobility Overal bed mobility: Needs  Assistance Bed Mobility: Supine to Sit     Supine to sit: HOB elevated, Min assist     General bed mobility comments: greatly increased time and needed incr assist to come to EOB.  Appeared difficult for pt to motor plan the movement.    Transfers Overall transfer level: Needs assistance Equipment used: Rolling walker (2 wheels) Transfers: Sit to/from Stand Sit to Stand: Mod assist, From elevated surface           General transfer comment: increased time, cues for hand placement and pt could not stand fully upright.  Pt seemed puzzled at his weakness.  Leaning body to left and not standing tall. Also noted clear gel on buttocks and unsure why.  Called nursing to notify them and they were going to let MD know.    Ambulation/Gait               General Gait Details: Unable to take steps away from bed. Did side step to [facility] with min assist.   Stairs             Wheelchair Mobility    Modified Rankin (Stroke Patients Only)       Balance Overall balance assessment: Needs assistance Sitting-balance support: Feet supported Sitting balance-Leahy Scale: Fair     Standing balance support: Bilateral upper extremity supported Standing balance-Leahy Scale: Poor Standing balance comment: UE support for balance                            Cognition Arousal/Alertness: Awake/alert Behavior During Therapy: WFL for tasks assessed/performed, Flat affect Overall Cognitive Status: Impaired/Different from baseline Area of Impairment: Safety/judgement, Attention, Problem solving, Following commands  Current Attention Level: Selective   Following Commands: Follows one step commands with increased time, Follows one step commands inconsistently Safety/Judgement: Decreased awareness of safety, Decreased awareness of deficits   Problem Solving: Slow processing, Requires verbal cues          Exercises General Exercises - Lower  Extremity Ankle Circles/Pumps: AROM, Both, 10 reps, Seated Long Arc Quad: AROM, Both, 10 reps, Seated    General Comments General comments (skin integrity, edema, etc.): VSS      Pertinent Vitals/Pain Pain Assessment Pain Assessment: No/denies pain Faces Pain Scale: No hurt    Home Living                          Prior Function            PT Goals (current goals can now be found in the care plan section) Acute Rehab PT Goals Patient Stated Goal: to go home Progress towards PT goals: Not progressing toward goals - comment (Unable to walk today.)    Frequency    Min 2X/week      PT Plan Discharge plan needs to be updated;Frequency needs to be updated    Co-evaluation              AM-PAC PT "6 Clicks" Mobility   Outcome Measure  Help needed turning from your back to your side while in a flat bed without using bedrails?: A Little Help needed moving from lying on your back to sitting on the side of a flat bed without using bedrails?: A Little Help needed moving to and from a bed to a chair (including a wheelchair)?: A Little Help needed standing up from a chair using your arms (e.g., wheelchair or bedside chair)?: A Lot Help needed to walk in hospital room?: A Lot Help needed climbing 3-5 steps with a railing? : Total 6 Click Score: 14    End of Session Equipment Utilized During Treatment: Gait belt Activity Tolerance: Patient limited by fatigue Patient left: with call bell/phone within reach;in bed;with bed alarm set Nurse Communication: Mobility status PT Visit Diagnosis: Muscle weakness (generalized) (M62.81);Other abnormalities of gait and mobility (R26.89);Other symptoms and signs involving the nervous system (R29.898)     Time: 6606-3016 PT Time Calculation (min) (ACUTE ONLY): 23 min  Charges:  $Gait Training: 8-22 mins $Therapeutic Exercise: 8-22 mins                     [facility] M,PT Acute Rehab Services [place] 12/08/2021, 2:54 PM

## 2021-12-08 NOTE — Care Management Important Message (Signed)
Important Message  Patient Details  Name: Kevin Soto MRN: [id number] Date of Birth: [birth year]   Medicare Important Message Given:  Yes     Sariyah Corcino [address] 12/08/2021, 4:28 PM

## 2021-12-08 NOTE — Progress Notes (Signed)
[facility] Progress Note   Subjective: Seen in room. No specific complaints. SCr 2.8 BUN 25. Volume appears fine. Denies SOB.   Objective Vitals:   12/07/21 2113 12/08/21 0428 12/08/21 0546 12/08/21 0809  BP: 95/69  (!) 99/55 110/66  Pulse: 85  67 67  Resp: '18  17 16  '$ Temp: 99.2 F (37.3 C)   98.6 F (37 C)  TempSrc: Oral   Oral  SpO2: 100%  100% 100%  Weight:  90.4 kg    Height:       Physical Exam General: Chronically ill appearing male in NAD Heart: S1,S2 RRR No M/R/G Lungs: CTAB A/P. No WOB.  Abdomen:NABs, NT, ND.  Extremities: No LE edema.  Dialysis Access: RIJ TDC Drsg intact. Needs sutures removed.   Additional Objective Labs: Basic Metabolic Panel: Recent Labs  Lab 12/04/21 0207 12/05/21 0031 12/06/21 0107 12/07/21 0117 12/08/21 0414  NA 133* 131* 132* 135 137  K 3.0* 3.3* 3.4* 3.3* 3.3*  CL 94* 94* 98 100 102  CO2 22 21* 21* 23 24  GLUCOSE 184* 163* 192* 149* 126*  BUN 35* 47* 53* 55* 25*  CREATININE 4.44* 4.96* 5.13* 5.22* 2.80*  CALCIUM 8.3* 8.1* 8.4* 8.8* 8.1*  PHOS 4.2 3.4  --   --   --    Liver Function Tests: Recent Labs  Lab 12/03/21 1352 12/05/21 0031 12/07/21 0637  AST 19  --  55*  ALT 14  --  37  ALKPHOS 72  --  114  BILITOT 1.6*  --  0.6  PROT 7.2  --  6.8  ALBUMIN 3.0* 2.2* 2.1*   No results for input(s): "LIPASE", "AMYLASE" in the last 168 hours. CBC: Recent Labs  Lab 12/03/21 1352 12/04/21 0207 12/05/21 0031 12/06/21 0107 12/07/21 0117 12/08/21 0414  WBC 32.2* 34.0* 25.8* 21.7* 18.0* 14.9*  NEUTROABS 27.9* 28.9* 22.9*  --   --   --   HGB 10.4* 9.1* 8.2* 8.1* 8.5* 7.6*  HCT 31.4* 26.3* 23.3* 23.7* 25.6* 21.7*  MCV 88.0 84.6 83.2 84.0 85.9 81.9  PLT 276 226 262 255 281 290   Blood Culture    Component Value Date/Time   SDES BLOOD LEFT ANTECUBITAL 12/04/2021 1242   SDES BLOOD LEFT HAND 12/04/2021 1242   SPECREQUEST  12/04/2021 1242    BOTTLES DRAWN AEROBIC AND ANAEROBIC Blood Culture adequate  volume   SPECREQUEST IN PEDIATRIC BOTTLE Blood Culture adequate volume 12/04/2021 1242   CULT  12/04/2021 1242    NO GROWTH 4 DAYS Performed at [facility], [place] [address], [place], [place] [address]    CULT  12/04/2021 1242    NO GROWTH 4 DAYS Performed at [facility], [place] [address], [place],  [address]    REPTSTATUS PENDING 12/04/2021 1242   REPTSTATUS PENDING 12/04/2021 1242    Cardiac Enzymes: No results for input(s): "CKTOTAL", "CKMB", "CKMBINDEX", "TROPONINI" in the last 168 hours. CBG: Recent Labs  Lab 12/06/21 2138 12/07/21 0712 12/07/21 1626 12/07/21 2113 12/08/21 0806  GLUCAP 207* 148* 119* 152* 108*   Iron Studies: No results for input(s): "IRON", "TIBC", "TRANSFERRIN", "FERRITIN" in the last 72 hours. '@lablastinr3'$ @ Studies/Results: No results found. Medications:  sodium chloride Stopped (12/04/21 0109)   ceFEPime (MAXIPIME) IV      aspirin EC  81 mg Oral Daily   Chlorhexidine Gluconate Cloth  6 each Topical Daily   Chlorhexidine Gluconate Cloth  6 each Topical Q0600   clopidogrel  75 mg Oral Daily  darbepoetin (ARANESP) injection - DIALYSIS  100 mcg Intravenous Q Thu-HD   heparin  5,000 Units Subcutaneous Q8H   insulin aspart  0-6 Units Subcutaneous TID WC   midodrine  10 mg Oral Q8H   pantoprazole  40 mg Oral BID   sodium chloride flush  10-40 mL Intracatheter Q12H     Assessment/Recommendations: Kevin Soto is a/an 61 y.o. male with a past medical history significant for ESRD, admitted for sepsis.      OP order: [facility] TTS, 4hr, F180, bfr 400, dfr 600,4K, 2.25Ca, edw 88.5, mircera 30 q2weeks   Septic/hypovolemic shock: Unclear source of infection.  Now off vasopressors.  On midodrine. IV antibiotics per primary team.   ESRD: Has been on dialysis since May.  Mild CKD before he started dialysis as AKI.  Continues to have fairly good urine output.  We held off on dialysis for several days but the GFR remains low at 12.  Urine  output was pretty good but BUN and creatinine continued to rise.  Likely dependent on dialysis at this point -Dialysis 12/07/2021. maintain TTS schedule -Dialysis through [facility] -Address more definitive access outpatient   Volume Status: Appears euvolemic at this time   Hypokalemia: Mild decrease in potassium.  4K bath on dialysis   Hyponatremia: Intermittent and mild.  No concerns   Anemia due to CKD/acute illness: Hemoglobin 8.5.  No iron given concerns for infection. On mircera 30 outpatient due on 7/20. Will plan to dose aranesp today with hemodialysis  Narissa Beaufort H. Leilani Cespedes NP-C 12/08/2021, 11:49 AM  [employer] [phone]

## 2021-12-09 DIAGNOSIS — N186 End stage renal disease: Secondary | ICD-10-CM | POA: Diagnosis not present

## 2021-12-09 DIAGNOSIS — Z992 Dependence on renal dialysis: Secondary | ICD-10-CM | POA: Diagnosis not present

## 2021-12-09 LAB — GLUCOSE, CAPILLARY
Glucose-Capillary: 129 mg/dL — ABNORMAL HIGH (ref 70–99)
Glucose-Capillary: 172 mg/dL — ABNORMAL HIGH (ref 70–99)
Glucose-Capillary: 181 mg/dL — ABNORMAL HIGH (ref 70–99)

## 2021-12-09 LAB — CBC
HCT: 22.6 % — ABNORMAL LOW (ref 39.0–52.0)
Hemoglobin: 7.8 g/dL — ABNORMAL LOW (ref 13.0–17.0)
MCH: 28.6 pg (ref 26.0–34.0)
MCHC: 34.5 g/dL (ref 30.0–36.0)
MCV: 82.8 fL (ref 80.0–100.0)
Platelets: 337 10*3/uL (ref 150–400)
RBC: 2.73 MIL/uL — ABNORMAL LOW (ref 4.22–5.81)
RDW: 14.9 % (ref 11.5–15.5)
WBC: 14.7 10*3/uL — ABNORMAL HIGH (ref 4.0–10.5)
nRBC: 0 % (ref 0.0–0.2)

## 2021-12-09 LAB — BASIC METABOLIC PANEL
Anion gap: 9 (ref 5–15)
BUN: 28 mg/dL — ABNORMAL HIGH (ref 8–23)
CO2: 26 mmol/L (ref 22–32)
Calcium: 8.5 mg/dL — ABNORMAL LOW (ref 8.9–10.3)
Chloride: 102 mmol/L (ref 98–111)
Creatinine, Ser: 3.3 mg/dL — ABNORMAL HIGH (ref 0.61–1.24)
GFR, Estimated: 20 mL/min — ABNORMAL LOW (ref >60)
Glucose, Bld: 137 mg/dL — ABNORMAL HIGH (ref 70–99)
Potassium: 3.2 mmol/L — ABNORMAL LOW (ref 3.5–5.1)
Sodium: 137 mmol/L (ref 135–145)

## 2021-12-09 LAB — CULTURE, BLOOD (ROUTINE X 2)
Culture: NO GROWTH
Culture: NO GROWTH
Special Requests: ADEQUATE
Special Requests: ADEQUATE

## 2021-12-09 MED ORDER — HEPARIN SODIUM (PORCINE) 1000 UNIT/ML IJ SOLN
INTRAMUSCULAR | Status: AC
  Filled 2021-12-09: qty 4

## 2021-12-09 MED ORDER — ANTICOAGULANT SODIUM CITRATE 4% (200MG/5ML) IV SOLN: 5.0000 mL | Status: DC | PRN

## 2021-12-09 MED ORDER — ALTEPLASE 2 MG IJ SOLR: 2.0000 mg | Freq: Once | INTRAMUSCULAR | Status: DC | PRN

## 2021-12-09 MED ORDER — POTASSIUM CHLORIDE CRYS ER 20 MEQ PO TBCR
40.0000 meq | EXTENDED_RELEASE_TABLET | Freq: Once | ORAL | Status: AC
Start: 2021-12-09 — End: 2021-12-09
  Administered 2021-12-09: 40 meq via ORAL
  Filled 2021-12-09: qty 2

## 2021-12-09 MED ORDER — HEPARIN SODIUM (PORCINE) 1000 UNIT/ML DIALYSIS
1000.0000 [IU] | INTRAMUSCULAR | Status: DC | PRN
  Administered 2021-12-09: 4000 [IU]

## 2021-12-09 NOTE — Progress Notes (Signed)
   Subjective: NAEON.  He is doing well today, has not had breakfast yet but is hungry. No concerns or complaints. Knows that he is awaiting SNF placement for strength training.  Objective:  Vital signs in last 24 hours: Vitals:   12/08/21 2025 12/09/21 0357 12/09/21 0500 12/09/21 0739  BP: 106/74 107/60  109/62  Pulse: 71 69  71  Resp: '18 20  16  '$ Temp: 98.9 F (37.2 C) 98.3 F (36.8 C)  97.9 F (36.6 C)  TempSrc: Oral Oral  Oral  SpO2: 100% 100%  99%  Weight:   91 kg   Height:       General: elderly male, laying in bed, NAD. CV: normal rate and regular rhythm, no m/r/g. Pulm: CTABL, normal WOB on RA. Abdomen: soft, nondistended, nontender, normoactive BS. MSK: no peripheral edema noted.  Neuro: AAOx3, no focal deficits noted.  Skin: warm and dry. Psych: pleasant mood and affect.  Assessment/Plan:  Principal Problem:   Sepsis ([place]) Active Problems:   Diabetes mellitus type 2 with neurological manifestations ([place])   Hyperlipidemia due to type 2 diabetes mellitus ([place])   CVA (cerebral vascular accident) ([place])   ESRD (end stage renal disease) on dialysis ([place])   Septic shock (H[facility])  Kevin Soto is a 61 yo gentleman with ESRD on HD TTS, CAD, previous CVA, T2DM who presented with sepsis of unknown etiology requiring ICU admission, which has since resolved. He is medically stable for discharge, awaiting SNF placement.  Septic Shock - RESOLVED Hypotension Unclear etiology of shock, but this has resolved and he has completed his course of antibiotics. He has 4 sets of blood cultures with no growth, although these were collected after antibiotics were initiated. Still has a leukocytosis, but this has remained stable and he has remained afebrile despite discontinuation of antibiotics. He remains on midodrine TID with normotensive BP readings. He is medically stable for discharge, pending SNF placement -continue midodrine '10mg'$  TID -pending SNF placement, appreciate TOC  assistance -given hypotension requiring midodrine therapy, will need to consider holding antihypertensive medications at discharge  ESRD on HD TTS Anemia of chronic renal disease Hypokalemia Obtaining HD via R upper chest catheter. Prior kidney biopsy showing myoglobin casts, 40% interstitial fibrosis, tubular atrophy, and diabetic glomerulopathy (Class IIB). Myoglobin casts were thought to be 2/2 statin therapy which has since been discontinued. Appreciate nephrology assistance with HD while inpatient. -HD per nephrology -ESA with HD -repleting K as he has remained mildly hypokalemic for the past few days  Prior to Admission Living Arrangement: Home Anticipated Discharge Location: SNF Barriers to Discharge: awaiting SNF placement Dispo: Anticipated discharge in approximately 1-2 day(s).   [name], MD 12/09/2021, 9:33 AM Pager: [phone] After 5pm on weekdays and 1pm on weekends: On Call pager [phone]

## 2021-12-09 NOTE — Progress Notes (Signed)
[facility] Progress Note   Subjective:He was supposed to discharge yesterday but now awaiting SNF placement. HD today on schedule.   Objective Vitals:   12/08/21 2025 12/09/21 0357 12/09/21 0500 12/09/21 0739  BP: 106/74 107/60  109/62  Pulse: 71 69  71  Resp: '18 20  16  '$ Temp: 98.9 F (37.2 C) 98.3 F (36.8 C)  97.9 F (36.6 C)  TempSrc: Oral Oral  Oral  SpO2: 100% 100%  99%  Weight:   91 kg   Height:       General: elderly male, laying in bed, NAD. CV: normal rate and regular rhythm, no m/r/g. Pulm: CTABL, normal WOB on RA. Abdomen: soft, nondistended, nontender, normoactive BS. MSK: no peripheral edema noted.  Neuro: AAOx3, no focal deficits noted.  Skin: warm and dry.   Dialysis Orders:  Additional Objective Labs: Basic Metabolic Panel: Recent Labs  Lab 12/04/21 0207 12/05/21 0031 12/06/21 0107 12/07/21 0117 12/08/21 0414 12/09/21 0131  NA 133* 131*   < > 135 137 137  K 3.0* 3.3*   < > 3.3* 3.3* 3.2*  CL 94* 94*   < > 100 102 102  CO2 22 21*   < > '23 24 26  '$ GLUCOSE 184* 163*   < > 149* 126* 137*  BUN 35* 47*   < > 55* 25* 28*  CREATININE 4.44* 4.96*   < > 5.22* 2.80* 3.30*  CALCIUM 8.3* 8.1*   < > 8.8* 8.1* 8.5*  PHOS 4.2 3.4  --   --   --   --    < > = values in this interval not displayed.   Liver Function Tests: Recent Labs  Lab 12/03/21 1352 12/05/21 0031 12/07/21 0637  AST 19  --  55*  ALT 14  --  37  ALKPHOS 72  --  114  BILITOT 1.6*  --  0.6  PROT 7.2  --  6.8  ALBUMIN 3.0* 2.2* 2.1*   No results for input(s): "LIPASE", "AMYLASE" in the last 168 hours. CBC: Recent Labs  Lab 12/03/21 1352 12/04/21 0207 12/05/21 0031 12/06/21 0107 12/07/21 0117 12/08/21 0414 12/09/21 0131  WBC 32.2* 34.0* 25.8* 21.7* 18.0* 14.9* 14.7*  NEUTROABS 27.9* 28.9* 22.9*  --   --   --   --   HGB 10.4* 9.1* 8.2* 8.1* 8.5* 7.6* 7.8*  HCT 31.4* 26.3* 23.3* 23.7* 25.6* 21.7* 22.6*  MCV 88.0 84.6 83.2 84.0 85.9 81.9 82.8  PLT 276 226 262  255 281 290 337   Blood Culture    Component Value Date/Time   SDES BLOOD LEFT ANTECUBITAL 12/04/2021 1242   SDES BLOOD LEFT HAND 12/04/2021 1242   SPECREQUEST  12/04/2021 1242    BOTTLES DRAWN AEROBIC AND ANAEROBIC Blood Culture adequate volume   SPECREQUEST IN PEDIATRIC BOTTLE Blood Culture adequate volume 12/04/2021 1242   CULT  12/04/2021 1242    NO GROWTH 5 DAYS Performed at [facility], [place] [address] [place], [place] [address]    CULT  12/04/2021 1242    NO GROWTH 5 DAYS Performed at [facility], [place] [address], [place],  [address]    REPTSTATUS 12/09/2021 FINAL 12/04/2021 1242   REPTSTATUS 12/09/2021 FINAL 12/04/2021 1242    Cardiac Enzymes: No results for input(s): "CKTOTAL", "CKMB", "CKMBINDEX", "TROPONINI" in the last 168 hours. CBG: Recent Labs  Lab 12/08/21 0806 12/08/21 1234 12/08/21 1635 12/08/21 2022 12/09/21 0758  GLUCAP 108* 203* 136* 218* 129*   Iron Studies:  No results for input(s): "IRON", "TIBC", "TRANSFERRIN", "FERRITIN" in the last 72 hours. '@lablastinr3'$ @ Studies/Results: No results found. Medications:  sodium chloride Stopped (12/04/21 0109)    aspirin EC  81 mg Oral Daily   Chlorhexidine Gluconate Cloth  6 each Topical Daily   Chlorhexidine Gluconate Cloth  6 each Topical Q0600   clopidogrel  75 mg Oral Daily   darbepoetin (ARANESP) injection - DIALYSIS  100 mcg Intravenous Q Thu-HD   heparin  5,000 Units Subcutaneous Q8H   insulin aspart  0-6 Units Subcutaneous TID WC   midodrine  10 mg Oral Q8H   pantoprazole  40 mg Oral BID   potassium chloride  40 mEq Oral Once   sodium chloride flush  10-40 mL Intracatheter Q12H     Assessment/Recommendations: Kevin Soto is a/an 61 y.o. male with a past medical history significant for ESRD, admitted for sepsis.      OP order: [facility] TTS, 4hr, F180, bfr 400, dfr 600,4K, 2.25Ca, edw 88.5, mircera 30 q2weeks   Septic/hypovolemic shock: Unclear source of infection.  Now  off vasopressors.  On midodrine. IV antibiotics per primary team.   ESRD: Has been on dialysis since May.  Mild CKD before he started dialysis as AKI.  Continues to have fairly good urine output.  We held off on dialysis for several days but the GFR remains low at 12.  Urine output was pretty good but BUN and creatinine continued to rise.  Likely dependent on dialysis at this point -Dialysis 12/07/2021. maintain TTS schedule -Dialysis through [facility] -Address more definitive access outpatient   Volume Status: Appears euvolemic at this time   Hypokalemia: Mild decrease in potassium.  4K bath on dialysis   Hyponatremia: Intermittent and mild.  No concerns   Anemia due to CKD/acute illness: Hemoglobin 7.8.  No iron given concerns for infection. Start when he is off ABX. On mircera 30 outpatient due on 7/20. Will plan to dose aranesp today with hemodialysis  Ruthmary Occhipinti H. Lurline Caver NP-C 12/09/2021, 9:56 AM  [employer] [phone]

## 2021-12-10 DIAGNOSIS — N186 End stage renal disease: Secondary | ICD-10-CM | POA: Diagnosis not present

## 2021-12-10 DIAGNOSIS — Z992 Dependence on renal dialysis: Secondary | ICD-10-CM | POA: Diagnosis not present

## 2021-12-10 LAB — GLUCOSE, CAPILLARY
Glucose-Capillary: 142 mg/dL — ABNORMAL HIGH (ref 70–99)
Glucose-Capillary: 179 mg/dL — ABNORMAL HIGH (ref 70–99)
Glucose-Capillary: 131 mg/dL — ABNORMAL HIGH (ref 70–99)
Glucose-Capillary: 153 mg/dL — ABNORMAL HIGH (ref 70–99)

## 2021-12-10 LAB — BASIC METABOLIC PANEL
Anion gap: 8 (ref 5–15)
BUN: 12 mg/dL (ref 8–23)
CO2: 28 mmol/L (ref 22–32)
Calcium: 8.4 mg/dL — ABNORMAL LOW (ref 8.9–10.3)
Chloride: 99 mmol/L (ref 98–111)
Creatinine, Ser: 2.12 mg/dL — ABNORMAL HIGH (ref 0.61–1.24)
GFR, Estimated: 34 mL/min — ABNORMAL LOW (ref >60)
Glucose, Bld: 178 mg/dL — ABNORMAL HIGH (ref 70–99)
Potassium: 3 mmol/L — ABNORMAL LOW (ref 3.5–5.1)
Sodium: 135 mmol/L (ref 135–145)

## 2021-12-10 LAB — CBC
HCT: 23.2 % — ABNORMAL LOW (ref 39.0–52.0)
Hemoglobin: 7.9 g/dL — ABNORMAL LOW (ref 13.0–17.0)
MCH: 28.6 pg (ref 26.0–34.0)
MCHC: 34.1 g/dL (ref 30.0–36.0)
MCV: 84.1 fL (ref 80.0–100.0)
Platelets: 317 10*3/uL (ref 150–400)
RBC: 2.76 MIL/uL — ABNORMAL LOW (ref 4.22–5.81)
RDW: 15.1 % (ref 11.5–15.5)
WBC: 12.1 10*3/uL — ABNORMAL HIGH (ref 4.0–10.5)
nRBC: 0.2 % (ref 0.0–0.2)

## 2021-12-10 LAB — MAGNESIUM: Magnesium: 1.4 mg/dL — ABNORMAL LOW (ref 1.7–2.4)

## 2021-12-10 MED ORDER — HEPARIN SODIUM (PORCINE) 5000 UNIT/ML IJ SOLN
5000.0000 [IU] | Freq: Three times a day (TID) | INTRAMUSCULAR | Status: DC
  Administered 2021-12-10 – 2021-12-12 (×6): 5000 [IU] via SUBCUTANEOUS
  Filled 2021-12-10 (×5): qty 1

## 2021-12-10 MED ORDER — POTASSIUM CHLORIDE CRYS ER 20 MEQ PO TBCR
40.0000 meq | EXTENDED_RELEASE_TABLET | Freq: Once | ORAL | Status: AC
  Administered 2021-12-10: 40 meq via ORAL
  Filled 2021-12-10: qty 2

## 2021-12-10 MED ORDER — MAGNESIUM SULFATE 2 GM/50ML IV SOLN
2.0000 g | Freq: Once | INTRAVENOUS | Status: AC
  Administered 2021-12-10: 2 g via INTRAVENOUS
  Filled 2021-12-10: qty 50

## 2021-12-10 NOTE — Plan of Care (Signed)
  Problem: Nutrition: Goal: Adequate nutrition will be maintained Outcome: Progressing   Problem: Pain Managment: Goal: General experience of comfort will improve Outcome: Progressing   Problem: Safety: Goal: Ability to remain free from injury will improve Outcome: Progressing   

## 2021-12-10 NOTE — Progress Notes (Cosign Needed Addendum)
[facility] Progress Note   Subjective: Seen in room, getting AM care. No C/Os. Awaiting SNF placement.     Objective Vitals:   12/09/21 1812 12/09/21 1959 12/10/21 0512 12/10/21 0741  BP: 106/62 111/62 112/61 127/76  Pulse: 72 70 78 64  Resp: '18 16 18 20  '$ Temp: 98.3 F (36.8 C) 98.4 F (36.9 C) 98 F (36.7 C) 98.8 F (37.1 C)  TempSrc: Oral Oral Oral Oral  SpO2: 100% 100% 100% 100%  Weight:      Height:       Physical Exam General: Very pleasant older male, laying in bed, NAD. CV: normal rate and regular rhythm, no m/r/g. Pulm: CTABL, normal WOB on RA. Abdomen: soft, nondistended, nontender, normoactive BS. MSK: no peripheral edema noted.  Neuro: AAOx3, no focal deficits noted.  Skin: warm and dry. HD access: RIJ TDC drsg intact.   Additional Objective Labs: Basic Metabolic Panel: Recent Labs  Lab 12/04/21 0207 12/05/21 0031 12/06/21 0107 12/08/21 0414 12/09/21 0131 12/10/21 0239  NA 133* 131*   < > 137 137 135  K 3.0* 3.3*   < > 3.3* 3.2* 3.0*  CL 94* 94*   < > 102 102 99  CO2 22 21*   < > '24 26 28  '$ GLUCOSE 184* 163*   < > 126* 137* 178*  BUN 35* 47*   < > 25* 28* 12  CREATININE 4.44* 4.96*   < > 2.80* 3.30* 2.12*  CALCIUM 8.3* 8.1*   < > 8.1* 8.5* 8.4*  PHOS 4.2 3.4  --   --   --   --    < > = values in this interval not displayed.   Liver Function Tests: Recent Labs  Lab 12/03/21 1352 12/05/21 0031 12/07/21 0637  AST 19  --  55*  ALT 14  --  37  ALKPHOS 72  --  114  BILITOT 1.6*  --  0.6  PROT 7.2  --  6.8  ALBUMIN 3.0* 2.2* 2.1*   No results for input(s): "LIPASE", "AMYLASE" in the last 168 hours. CBC: Recent Labs  Lab 12/03/21 1352 12/04/21 0207 12/05/21 0031 12/06/21 0107 12/07/21 0117 12/08/21 0414 12/09/21 0131 12/10/21 0239  WBC 32.2* 34.0* 25.8* 21.7* 18.0* 14.9* 14.7* 12.1*  NEUTROABS 27.9* 28.9* 22.9*  --   --   --   --   --   HGB 10.4* 9.1* 8.2* 8.1* 8.5* 7.6* 7.8* 7.9*  HCT 31.4* 26.3* 23.3* 23.7* 25.6*  21.7* 22.6* 23.2*  MCV 88.0 84.6 83.2 84.0 85.9 81.9 82.8 84.1  PLT 276 226 262 255 281 290 337 317   Blood Culture    Component Value Date/Time   SDES BLOOD LEFT ANTECUBITAL 12/04/2021 1242   SDES BLOOD LEFT HAND 12/04/2021 1242   SPECREQUEST  12/04/2021 1242    BOTTLES DRAWN AEROBIC AND ANAEROBIC Blood Culture adequate volume   SPECREQUEST IN PEDIATRIC BOTTLE Blood Culture adequate volume 12/04/2021 1242   CULT  12/04/2021 1242    NO GROWTH 5 DAYS Performed at [facility], [place] [address], [place], [place] [address]    CULT  12/04/2021 1242    NO GROWTH 5 DAYS Performed at [facility], [place] [address] [place], [place] [address]    REPTSTATUS 12/09/2021 FINAL 12/04/2021 1242   REPTSTATUS 12/09/2021 FINAL 12/04/2021 1242    Cardiac Enzymes: No results for input(s): "CKTOTAL", "CKMB", "CKMBINDEX", "TROPONINI" in the last 168 hours. CBG: Recent Labs  Lab 12/08/21 2022 12/09/21  5056 12/09/21 1130 12/09/21 2203 12/10/21 0818  GLUCAP 218* 129* 172* 181* 142*   Iron Studies: No results for input(s): "IRON", "TIBC", "TRANSFERRIN", "FERRITIN" in the last 72 hours. '@lablastinr3'$ @ Studies/Results: No results found. Medications:  sodium chloride Stopped (12/04/21 0109)    aspirin EC  81 mg Oral Daily   Chlorhexidine Gluconate Cloth  6 each Topical Daily   Chlorhexidine Gluconate Cloth  6 each Topical Q0600   clopidogrel  75 mg Oral Daily   darbepoetin (ARANESP) injection - DIALYSIS  100 mcg Intravenous Q Thu-HD   heparin  5,000 Units Subcutaneous Q8H   insulin aspart  0-6 Units Subcutaneous TID WC   midodrine  10 mg Oral Q8H   pantoprazole  40 mg Oral BID   sodium chloride flush  10-40 mL Intracatheter Q12H     Assessment/Recommendations: Kevin Soto is a/an 61 y.o. male with a past medical history significant for ESRD, admitted for sepsis.      OP order: [facility] TTS, 4hr, F180, bfr 400, dfr 600,4K, 2.25Ca, edw 88.5, mircera 30 q2weeks    Septic/hypovolemic shock: Unclear source of infection.  Now off vasopressors.  On midodrine. IV antibiotics per primary team.   ESRD: Has been on dialysis since May.  Mild CKD before he started dialysis as AKI.  Continues to have fairly good urine output.  We held off on dialysis for several days but the GFR remains low at 12.  Urine output was pretty good but BUN and creatinine continued to rise.  Likely dependent on dialysis at this point -Dialysis -maintain TTS schedule. Next HD 12/12/2021 -Dialysis through [facility] -Address more definitive access outpatient   Volume Status: Appears euvolemic at this time   Hypokalemia: Mild decrease in potassium.  4K bath on dialysis   Hyponatremia: Intermittent and mild.  No concerns   Anemia due to CKD/acute illness: Hemoglobin 7.9  No iron given concerns for infection. Start when he is off ABX.  Rec'd Aranesp 100 mcg IV 12/07/2021.   Kevin Soto H. Ger Nicks NP-C 12/10/2021, 10:33 AM  [employer] [phone]

## 2021-12-10 NOTE — Progress Notes (Signed)
   Subjective: NAEON.  Doing well today, had most of his breakfast. He was able to pass a BM yesterday. Has been drinking water. 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 No questions or concerns.  Objective:  Vital signs in last 24 hours: Vitals:   12/09/21 1812 12/09/21 1959 12/10/21 0512 12/10/21 0741  BP: 106/62 111/62 112/61 127/76  Pulse: 72 70 78 64  Resp: '18 16 18 20  '$ Temp: 98.3 F (36.8 C) 98.4 F (36.9 C) 98 F (36.7 C) 98.8 F (37.1 C)  TempSrc: Oral Oral Oral Oral  SpO2: 100% 100% 100% 100%  Weight:      Height:       General: elderly male, sitting up in bed eating breakfast, NAD. CV: normal rate and regular rhythm, no m/r/g. Pulm: CTABL, normal WOB on RA. Neuro: AAOx3, no focal deficits noted. Skin: warm and dry. Psych: pleasant mood and affect.  Assessment/Plan:  Principal Problem:   Sepsis ([place]) Active Problems:   Diabetes mellitus type 2 with neurological manifestations ([place])   Hyperlipidemia due to type 2 diabetes mellitus ([place])   CVA (cerebral vascular accident) ([place])   ESRD (end stage renal disease) on dialysis ([place])   Septic shock (HCC)  Kevin Soto is a 61 yo gentleman with ESRD on HD TTS, CAD, previous CVA, T2DM who presented with sepsis of unknown etiology requiring ICU admission, which has since resolved. He is medically stable for discharge, awaiting SNF placement.  Septic Shock - RESOLVED Hypotension Unclear etiology of shock, but this has resolved and he has completed his course of antibiotics. He has 4 sets of blood cultures with no growth, although these were collected after antibiotics were initiated. Leukocytosis continuing to improve and he has remained afebrile off of antibiotics. He remains on midodrine TID with normotensive BP readings. He remains medically stable for discharge, pending SNF placement. -continue midodrine '10mg'$  TID -pending SNF placement, appreciate TOC assistance -given need for midodrine, will need  to consider holding antihypertensive medications at discharge  ESRD on HD TTS Anemia of chronic renal disease Hypokalemia, Hypomagnesemia Obtaining HD via R upper chest catheter. Prior kidney biopsy showing myoglobin casts, 40% interstitial fibrosis, tubular atrophy, and diabetic glomerulopathy (Class IIB). Myoglobin casts were thought to be 2/2 statin therapy which has since been discontinued. Appreciate nephrology assistance with HD while inpatient. -HD per nephrology -ESA with HD -K and Mg both low, will replete both and repeat labs tomorrow  Prior to Admission Living Arrangement: Home Anticipated Discharge Location: SNF Barriers to Discharge: awaiting SNF placement Dispo: Anticipated discharge in approximately 1-2 day(s).   Kevin Axe, MD 12/10/2021, 10:36 AM Pager: [phone] After 5pm on weekdays and 1pm on weekends: On Call pager [phone]

## 2021-12-11 DIAGNOSIS — N186 End stage renal disease: Secondary | ICD-10-CM | POA: Diagnosis not present

## 2021-12-11 DIAGNOSIS — Z992 Dependence on renal dialysis: Secondary | ICD-10-CM | POA: Diagnosis not present

## 2021-12-11 LAB — BASIC METABOLIC PANEL
Anion gap: 9 (ref 5–15)
BUN: 19 mg/dL (ref 8–23)
CO2: 27 mmol/L (ref 22–32)
Calcium: 8.5 mg/dL — ABNORMAL LOW (ref 8.9–10.3)
Chloride: 102 mmol/L (ref 98–111)
Creatinine, Ser: 2.77 mg/dL — ABNORMAL HIGH (ref 0.61–1.24)
GFR, Estimated: 25 mL/min — ABNORMAL LOW (ref >60)
Glucose, Bld: 137 mg/dL — ABNORMAL HIGH (ref 70–99)
Potassium: 3.2 mmol/L — ABNORMAL LOW (ref 3.5–5.1)
Sodium: 138 mmol/L (ref 135–145)

## 2021-12-11 LAB — GLUCOSE, CAPILLARY
Glucose-Capillary: 127 mg/dL — ABNORMAL HIGH (ref 70–99)
Glucose-Capillary: 200 mg/dL — ABNORMAL HIGH (ref 70–99)
Glucose-Capillary: 145 mg/dL — ABNORMAL HIGH (ref 70–99)
Glucose-Capillary: 142 mg/dL — ABNORMAL HIGH (ref 70–99)

## 2021-12-11 LAB — MAGNESIUM: Magnesium: 1.8 mg/dL (ref 1.7–2.4)

## 2021-12-11 MED ORDER — CHLORHEXIDINE GLUCONATE CLOTH 2 % EX PADS
6.0000 | MEDICATED_PAD | Freq: Every day | CUTANEOUS | Status: DC
  Administered 2021-12-12: 6 via TOPICAL

## 2021-12-11 MED ORDER — MAGNESIUM SULFATE IN D5W 1-5 GM/100ML-% IV SOLN
1.0000 g | Freq: Once | INTRAVENOUS | Status: AC
Start: 2021-12-11 — End: 2021-12-11
  Administered 2021-12-11: 1 g via INTRAVENOUS
  Filled 2021-12-11: qty 100

## 2021-12-11 MED ORDER — POTASSIUM CHLORIDE CRYS ER 20 MEQ PO TBCR
40.0000 meq | EXTENDED_RELEASE_TABLET | Freq: Once | ORAL | Status: AC
  Administered 2021-12-11: 40 meq via ORAL
  Filled 2021-12-11: qty 2

## 2021-12-11 MED ORDER — MIDODRINE HCL 5 MG PO TABS
5.0000 mg | ORAL_TABLET | Freq: Three times a day (TID) | ORAL | Status: DC
  Administered 2021-12-11 – 2021-12-12 (×3): 5 mg via ORAL
  Filled 2021-12-11 (×3): qty 1

## 2021-12-11 NOTE — TOC Progression Note (Addendum)
Transition of Care [facility]) - Progression Note    Patient Details  Name: Kevin Soto MRN: [id number] Date of Birth: [birth year]  Transition of Care [facility]) CM/SW Contact  Kevin Soto, [place] Phone Number: 12/11/2021, 2:01 PM  Clinical Narrative:    CSW spoke with [facility] admissions, they are willing to consider if pt HD schedule can be changed to MWF and to confirm pt dc plan from SNF bc there are no LTC beds available.   CSW spoke with pt sister, she stated that the pt will DC home with her from the SNF. Pt sister is involved in all pt care and transports to and from HD. Kevin Soto does not allow family to transport.   CSW reached out to [facility] CSW about getting pt a MWF schedule. Kevin Soto informed CSW that there are no MWF times in [place] but there is one in [place].   CSW contacted admissions at [facility] to inform them of the HD availability, they will check to see if their transportation will go to [place]. CSW will continue to follow for DC needs.  CSW contacted [facility] who may be able to accept pt if he is agreeable to a semiprivate room. Pt is agreeable to Kevin Soto pending ref# [id number]   Expected Discharge Plan: Skilled Nursing Facility Barriers to Discharge: Insurance Authorization, SNF Pending bed offer, Continued Medical Work up  Expected Discharge Plan and Services Expected Discharge Plan: [place] Choice: [place] arrangements for the past 2 months: Single Family Home                                       Social Determinants of Health (SDOH) Interventions    Readmission Risk Interventions     No data to display

## 2021-12-11 NOTE — Progress Notes (Addendum)
[facility] Progress Note  Subjective: pt seen in room, no c/o's today  Vitals:   12/10/21 2135 12/11/21 0500 12/11/21 0522 12/11/21 0737  BP: 125/72  (!) 146/67 131/74  Pulse: 78  (!) 55 60  Resp: '17  17 16  '$ Temp: 98.5 F (36.9 C)  98.6 F (37 C) 98.2 F (36.8 C)  TempSrc: Oral  Oral Oral  SpO2: 100%  100% 100%  Weight:  87 kg    Height:        Exam: Gen: pleasant older male, NAD CV: normal rate and regular rhythm, no m/r/g. Pulm: CTABL, normal WOB on RA. Abdomen: soft, nondistended, nontender, normoactive BS. MSK: no peripheral edema noted.  Neuro: AAOx3, no focal deficits noted.  HD access: RIJ TDC in place    OP HD: TTS [facility]  AKI  4h  400/600  4K/2.25 bath  88.5kg  TDC  Hep none - mircera 30 q2, has not started - iron sucrose 100 mg qhd thru 8/08 - last HD 7/13, post HD 86kg   CXR 7/16 - IMPRESSION: Low lung volumes without acute cardiopulmonary findings.     Na 138  K 3.2  CO2 27  BUN 19  creat 2.77   Assessment/ Plan: Septic/hypovolemic shock - unclear source of infection. CXR, bcx's, GI panel and Cdif all negative. Now off vasopressors. SP IV antibiotics. Per pmd.  ESRD - has been on dialysis since 09/2021, had AKI on mild CKD. Continues to have fairly good urine output.  We held off on dialysis for several days here but the GFR remained low at 12 and BUN and creatinine continued to rise. Likely he is still dependent on dialysis at this point. Next HD tomorrow.  BP/ vol - appears euvolemic at this time, close to edw. Will taper midodrine down/ off as tolerated. Wt's peaked at [place], now down to admit wt of 87kg.  Hyponatremia - resolved, Na+ 138.  Anemia CKD/acute illness - Hemoglobin 7.9  No iron given concerns for infection. Start when he is off ABX.  Rec'd Aranesp 100 mcg IV 07/20 here, cont weekly on Thursdays. MBD ckd - CCa and phos are in range. No vdra or binders at this time.   Kevin Soto 12/11/2021, 8:49 AM   Recent Labs  Lab  12/05/21 0031 12/06/21 0107 12/07/21 0637 12/08/21 0414 12/09/21 0131 12/10/21 0239 12/11/21 0131  HGB 8.2*   < >  --    < > 7.8* 7.9*  --   ALBUMIN 2.2*  --  2.1*  --   --   --   --   CALCIUM 8.1*   < >  --    < > 8.5* 8.4* 8.5*  PHOS 3.4  --   --   --   --   --   --   CREATININE 4.96*   < >  --    < > 3.30* 2.12* 2.77*  K 3.3*   < >  --    < > 3.2* 3.0* 3.2*   < > = values in this interval not displayed.   No results for input(s): "IRON", "TIBC", "FERRITIN" in the last 168 hours. Inpatient medications:  aspirin EC  81 mg Oral Daily   Chlorhexidine Gluconate Cloth  6 each Topical Daily   Chlorhexidine Gluconate Cloth  6 each Topical Q0600   clopidogrel  75 mg Oral Daily   darbepoetin (ARANESP) injection - DIALYSIS  100 mcg Intravenous Q Thu-HD   heparin  5,000 Units Subcutaneous  Q8H   insulin aspart  0-6 Units Subcutaneous TID WC   midodrine  10 mg Oral Q8H   pantoprazole  40 mg Oral BID   sodium chloride flush  10-40 mL Intracatheter Q12H    sodium chloride Stopped (12/04/21 0109)   magnesium sulfate bolus IVPB 1 g (12/11/21 0844)   acetaminophen, sodium chloride flush

## 2021-12-11 NOTE — Progress Notes (Signed)
Mobility Specialist Progress Note   12/11/21 1704  Mobility  Activity Ambulated with assistance in hallway  Level of Assistance Moderate assist, patient does 50-74%  Assistive Device Front wheel walker  Distance Ambulated (ft) 52 ft  Activity Response Tolerated well  $Mobility charge 1 Mobility   Pre Mobility: 71 HR, 124/72 BP Post Mobility: 69 HR, 113/98 BP  Pt was in bed and agreeable with mobility. Mod A to get patient from supine to EOB d/t trunk weakness. Once standing, requested a chair follow d/t patient's instability. X1 seated rest break d/t fatigue but recovered quickly. Returned back to bed with Mod A on UE and trunk. Pt was left sitting up with call bell in reach, food tray in front, and bed alarm on.   Holland Falling Mobility Specialist Cutter #:  [phone] Acute Rehab Office:  [phone]

## 2021-12-11 NOTE — Progress Notes (Signed)
Physical Therapy Treatment Patient Details Name: Kevin Soto MRN: [id number] DOB: 12/27/1960 Today's Date: 12/11/2021   History of Present Illness 61 yo male presenting to ED on 7/16 with weakness. PCCM consulted for persistent hypotension. Admitted to ICU. PMH including HTN, CAD, ESRD (HD T, TH, sat), dilated cardiomyopathy, CVA, T2DM.    PT Comments    Patient progressing with mobility over last session, though still not as good as session previous to that.  Noted R lateral lean and some ataxia with wide BOS and L foot outside the walker.  Attempted to correct in standing with and without UE support, but without success.  He remains appropriate for SNF level rehab at d/c.  PT to continue to follow acutely.   Recommendations for follow up therapy are one component of a multi-disciplinary discharge planning process, led by the attending physician.  Recommendations may be updated based on patient status, additional functional criteria and insurance authorization.  Follow Up Recommendations  Skilled nursing-short term rehab (<3 hours/day) Can patient physically be transported by private vehicle: Yes   Assistance Recommended at Discharge Intermittent Supervision/Assistance  Patient can return home with the following A little help with walking and/or transfers;A little help with bathing/dressing/bathroom;Assistance with cooking/housework;Assist for transportation;Help with stairs or ramp for entrance   Equipment Recommendations  None recommended by PT    Recommendations for Other Services       Precautions / Restrictions Precautions Precautions: Fall     Mobility  Bed Mobility Overal bed mobility: Needs Assistance       Supine to sit: HOB elevated, Supervision     General bed mobility comments: effortful, but able to sit up unaided; to supine assist for positioning, leg onto bed    Transfers Overall transfer level: Needs assistance Equipment used: Rolling walker (2  wheels) Transfers: Sit to/from Stand Sit to Stand: Min assist           General transfer comment: stood x 3 to RW and cues for hand placement, assist for balance    Ambulation/Gait Ambulation/Gait assistance: Min assist, Mod assist Gait Distance (Feet): 70 Feet Assistive device: Rolling walker (2 wheels) Gait Pattern/deviations: Step-to pattern, Step-through pattern, Decreased stride length, Drifts right/left, Decreased weight shift to left, Wide base of support       General Gait Details: leaning R and walker to R with L foot outside of walker, assist on under R axilla and at L hip in attempt to correct; also performed side steps toward [employer] with min to mod A cues for stepping and walker placement still with wide BOS   Stairs             Wheelchair Mobility    Modified Rankin (Stroke Patients Only)       Balance Overall balance assessment: Needs assistance   Sitting balance-Leahy Scale: Fair     Standing balance support: Bilateral upper extremity supported Standing balance-Leahy Scale: Poor Standing balance comment: UE support for balance                            Cognition Arousal/Alertness: Awake/alert Behavior During Therapy: Flat affect Overall Cognitive Status: Impaired/Different from baseline Area of Impairment: Following commands, Safety/judgement, Problem solving                   Current Attention Level: Selective   Following Commands: Follows one step commands with increased time Safety/Judgement: Decreased awareness of safety, Decreased awareness of deficits  Problem Solving: Slow processing, Requires verbal cues          Exercises      General Comments        Pertinent Vitals/Pain Pain Assessment Faces Pain Scale: No hurt    Home Living                          Prior Function            PT Goals (current goals can now be found in the care plan section) Progress towards PT goals: Progressing  toward goals    Frequency    Min 2X/week      PT Plan Current plan remains appropriate    Co-evaluation              AM-PAC PT "6 Clicks" Mobility   Outcome Measure  Help needed turning from your back to your side while in a flat bed without using bedrails?: A Little Help needed moving from lying on your back to sitting on the side of a flat bed without using bedrails?: A Little Help needed moving to and from a bed to a chair (including a wheelchair)?: A Little Help needed standing up from a chair using your arms (e.g., wheelchair or bedside chair)?: A Little Help needed to walk in hospital room?: A Lot Help needed climbing 3-5 steps with a railing? : Total 6 Click Score: 15    End of Session Equipment Utilized During Treatment: Gait belt Activity Tolerance: Patient limited by fatigue Patient left: in bed;with call bell/phone within reach;with bed alarm set   PT Visit Diagnosis: Muscle weakness (generalized) (M62.81);Other abnormalities of gait and mobility (R26.89);Other symptoms and signs involving the nervous system (R29.898)     Time: 7793-9030 PT Time Calculation (min) (ACUTE ONLY): 23 min  Charges:  $Gait Training: 8-22 mins $Therapeutic Activity: 8-22 mins                     Magda Kiel, PT Acute Rehabilitation Services Office:[phone] 12/11/2021    Reginia Naas 12/11/2021, 5:17 PM

## 2021-12-11 NOTE — Progress Notes (Signed)
Hospital day#8 Subjective:    Overnight Events: None  Patient comfortable in bed, conversational, eating breakfast and awaiting hospital discharge. Denied pain or discomfort.  Objective:  Vital signs in last 24 hours: Vitals:   12/11/21 0500 12/11/21 0522 12/11/21 0737 12/11/21 1537  BP:  (!) 146/67 131/74 118/69  Pulse:  (!) 55 60 82  Resp:  '17 16 16  '$ Temp:  98.6 F (37 C) 98.2 F (36.8 C) 99.1 F (37.3 C)  TempSrc:  Oral Oral Oral  SpO2:  100% 100% 99%  Weight: 87 kg     Height:       Supplemental O2: Room Air SpO2: 99 % Filed Weights   12/09/21 0500 12/09/21 1350 12/11/21 0500  Weight: 91 kg 87.7 kg 87 kg    Physical Exam:  Gen: pleasant older male in NAD CV: normal r and rhythm, no m/r/g. Pulm: CTAB Abdomen: soft, non-tender, nondistended MSK: no peripheral edema Neuro: AAOx3 HD access: Right IJ catheter for HD in place     Intake/Output Summary (Last 24 hours) at 12/11/2021 1857 Last data filed at 12/11/2021 0522 Gross per 24 hour  Intake 480 ml  Output 750 ml  Net -270 ml   Net IO Since Admission: -2,013 mL [12/11/21 1857]  Pertinent Labs:    Latest Ref Rng & Units 12/10/2021    2:39 AM 12/09/2021    1:31 AM 12/08/2021    4:14 AM  CBC  WBC 4.0 - 10.5 K/uL 12.1  14.7  14.9   Hemoglobin 13.0 - 17.0 g/dL 7.9  7.8  7.6   Hematocrit 39.0 - 52.0 % 23.2  22.6  21.7   Platelets 150 - 400 K/uL 317  337  290        Latest Ref Rng & Units 12/11/2021    1:31 AM 12/10/2021    2:39 AM 12/09/2021    1:31 AM  CMP  Glucose 70 - 99 mg/dL 137  178  137   BUN 8 - 23 mg/dL '19  12  28   '$ Creatinine 0.61 - 1.24 mg/dL 2.77  2.12  3.30   Sodium 135 - 145 mmol/L 138  135  137   Potassium 3.5 - 5.1 mmol/L 3.2  3.0  3.2   Chloride 98 - 111 mmol/L 102  99  102   CO2 22 - 32 mmol/L '27  28  26   '$ Calcium 8.9 - 10.3 mg/dL 8.5  8.4  8.5     Imaging: No results found.  Assessment/Plan:   Principal Problem:   Sepsis ([place]) Active Problems:   Diabetes mellitus  type 2 with neurological manifestations ([place])   Hyperlipidemia due to type 2 diabetes mellitus ([place])   CVA (cerebral vascular accident) ([place])   ESRD (end stage renal disease) on dialysis ([place])   Septic shock ([place])   Patient Summary: Kevin Soto is a 61 yo gentleman with ESRD on HD TTS, CAD, previous CVA, T2DM who presented with sepsis of unknown etiology requiring ICU admission, which has since resolved. He is medically stable for discharge, awaiting SNF placement.   Septic Shock - RESOLVED Hypotension Unclear etiology of shock, but this has resolved and he has completed his course of antibiotics. He has 4 sets of blood cultures with no growth, although these were collected after antibiotics were initiated. Leukocytosis continuing to improve and he has remained afebrile off of antibiotics. He remains on midodrine TID with normotensive BP readings. Continues to be stable for discharge today; pending SNF  placement and HD schedule coordination -continue midodrine '10mg'$  TID -pending SNF placement, appreciate TOC assistance -given need for midodrine, will need to consider holding antihypertensive medications at discharge   ESRD on HD TTS Anemia of chronic renal disease Hypokalemia, Hypomagnesemia Obtaining HD via R upper chest catheter. Prior kidney biopsy showing myoglobin casts, 40% interstitial fibrosis, tubular atrophy, and diabetic glomerulopathy (Class IIB). Myoglobin casts were thought to be 2/2 statin therapy which has since been discontinued. Appreciate nephrology assistance with HD while inpatient. -HD per nephrology -ESA with HD -K and Mg both low -Patient to HD tomorrow   Prior to Admission Living Arrangement: Home Anticipated Discharge Location: SNF Barriers to Discharge: awaiting SNF placement Dispo: Anticipated discharge in approximately 1-2 day(s).    Romana Juniper, MD Internal Medicine Resident PGY-1 Please contact the on call pager after 5 pm and on weekends  at [phone].

## 2021-12-11 NOTE — Progress Notes (Signed)
Contacted by CSW regarding snf request for a MWF schedule. Contacted [facility] and they currently do not have a MWF available. Contacted [facility] and they do not have a MWF available. Contacted [facility] admissions and was advised that no other clinic in [place] has a MWF. [facility] may have a MWF 6:35 am available if snf will transport to [facility]. This info was provided to CSW to discuss with snf. Will assist as needed.   Melven Sartorius Renal Navigator [phone]

## 2021-12-12 ENCOUNTER — Ambulatory Visit: Payer: Medicare Other | Admitting: Podiatry

## 2021-12-12 DIAGNOSIS — N186 End stage renal disease: Secondary | ICD-10-CM | POA: Diagnosis not present

## 2021-12-12 DIAGNOSIS — Z992 Dependence on renal dialysis: Secondary | ICD-10-CM | POA: Diagnosis not present

## 2021-12-12 LAB — GLUCOSE, CAPILLARY
Glucose-Capillary: 131 mg/dL — ABNORMAL HIGH (ref 70–99)
Glucose-Capillary: 154 mg/dL — ABNORMAL HIGH (ref 70–99)

## 2021-12-12 LAB — CBC
HCT: 22.6 % — ABNORMAL LOW (ref 39.0–52.0)
Hemoglobin: 7.5 g/dL — ABNORMAL LOW (ref 13.0–17.0)
MCH: 28.2 pg (ref 26.0–34.0)
MCHC: 33.2 g/dL (ref 30.0–36.0)
MCV: 85 fL (ref 80.0–100.0)
Platelets: 398 10*3/uL (ref 150–400)
RBC: 2.66 MIL/uL — ABNORMAL LOW (ref 4.22–5.81)
RDW: 15 % (ref 11.5–15.5)
WBC: 13.4 10*3/uL — ABNORMAL HIGH (ref 4.0–10.5)
nRBC: 0.1 % (ref 0.0–0.2)

## 2021-12-12 LAB — BASIC METABOLIC PANEL
Anion gap: 7 (ref 5–15)
BUN: 22 mg/dL (ref 8–23)
CO2: 27 mmol/L (ref 22–32)
Calcium: 8.8 mg/dL — ABNORMAL LOW (ref 8.9–10.3)
Chloride: 104 mmol/L (ref 98–111)
Creatinine, Ser: 2.72 mg/dL — ABNORMAL HIGH (ref 0.61–1.24)
GFR, Estimated: 25 mL/min — ABNORMAL LOW (ref >60)
Glucose, Bld: 128 mg/dL — ABNORMAL HIGH (ref 70–99)
Potassium: 3.4 mmol/L — ABNORMAL LOW (ref 3.5–5.1)
Sodium: 138 mmol/L (ref 135–145)

## 2021-12-12 MED ORDER — MIDODRINE HCL 5 MG PO TABS: 5.0000 mg | ORAL_TABLET | Freq: Three times a day (TID) | ORAL | Status: DC

## 2021-12-12 MED ORDER — DARBEPOETIN ALFA 100 MCG/0.5ML IJ SOSY: 100.0000 ug | PREFILLED_SYRINGE | INTRAMUSCULAR | Status: DC

## 2021-12-12 MED ORDER — HEPARIN SODIUM (PORCINE) 1000 UNIT/ML IJ SOLN
INTRAMUSCULAR | Status: AC
  Filled 2021-12-12: qty 4

## 2021-12-12 NOTE — Progress Notes (Signed)
Advised pt CSW that pt will be going to [place] snf at d/c and that snf is agreeable to transport pt to [facility] on TTS 10:35 arrival for 10:55 chair time. Pt is to d/c to snf today per attending. Contacted [facility] to advise clinic of pt's d/c today to snf and that pt will resume care on Thursday.   Melven Sartorius Renal Navigator [phone]

## 2021-12-12 NOTE — Discharge Instructions (Signed)
Dear Mr. Nauert,  You were admitted to the hospital because you had an infection that caused you to have very low blood pressures. Because of this, you were admitted to the intensive care unit and given medication to keep your blood pressure up. You were also given antibiotic medication to cover your infection. You completed a 5 day antibiotic course; your white cell count has gone down significantly and overall your vital signs have been stable. You are being discharged from the hospital to go to a skilled nursing home facility to work on physical therapy.   We have stopped your blood pressure medications as your blood pressure was too low during your hospitalization. We have increased the dosing of a medication called midodrine, as this helps keeps your blood pressures up. Please continue drinking and eating to make sure your blood pressure stays stable.  The kidney doctors also saw you while you were here. You were continued on your hemodialysis schedule without complications. You should continue going to HD and let your doctors know if you notice any changes in tenderness, redness, or oozing out of the catheter on your chest.  Please take care,  Your Internal Medicine inpatient team [facility]

## 2021-12-12 NOTE — Discharge Summary (Signed)
Name: Kevin Soto MRN: [id number] DOB: June 12, 1960 61 y.o. PCP: Romana Juniper, MD  Date of Admission: 12/03/2021  1:12 PM Date of Discharge: 12/12/2021 1:12 PM Attending Physician: Dr. Philipp Ovens  Discharge Diagnosis: Principal Problem:   Sepsis ([place]) Active Problems:   Diabetes mellitus type 2 with neurological manifestations ([place])   Hyperlipidemia due to type 2 diabetes mellitus ([place])   CVA (cerebral vascular accident) ([place])   ESRD (end stage renal disease) on dialysis ([place])   Septic shock ([place])    Discharge Medications: Allergies as of 12/12/2021       Reactions   Clonidine Derivatives Other (See Comments)   Dizziness Drowsiness         Medication List     STOP taking these medications    carvedilol 25 MG tablet Commonly known as: COREG   hydrochlorothiazide 25 MG tablet Commonly known as: HYDRODIURIL   lisinopril 20 MG tablet Commonly known as: ZESTRIL   tamsulosin 0.4 MG Caps capsule Commonly known as: FLOMAX       TAKE these medications    acetaminophen 325 MG tablet Commonly known as: TYLENOL Take 2 tablets (650 mg total) by mouth every 6 (six) hours as needed for mild pain (or Fever >/= 101).   aspirin EC 81 MG tablet Take 1 tablet (81 mg total) by mouth daily.   clopidogrel 75 MG tablet Commonly known as: PLAVIX Take 1 tablet (75 mg total) by mouth daily.   Darbepoetin Alfa 100 MCG/0.5ML Sosy injection Commonly known as: ARANESP Inject 0.5 mLs (100 mcg total) into the vein every Thursday with hemodialysis. Start taking on: December 14, 2021   dorzolamide-timolol 22.3-6.8 MG/ML ophthalmic solution Commonly known as: COSOPT Place 1 drop into both eyes daily.   fluticasone 50 MCG/ACT nasal spray Commonly known as: Flonase Place 2 sprays into both nostrils daily.   midodrine 5 MG tablet Commonly known as: PROAMATINE Take 1 tablet (5 mg total) by mouth every 8 (eight) hours.   nitroGLYCERIN 0.4 MG SL tablet Commonly known as:  NITROSTAT Place 1 tablet (0.4 mg total) under the tongue every 5 (five) minutes as needed. What changed: reasons to take this   pantoprazole 40 MG tablet Commonly known as: PROTONIX Take 1 tablet (40 mg total) by mouth 2 (two) times daily.   Senexon-S 8.6-50 MG tablet Generic drug: senna-docusate Take 1 tablet by mouth at bedtime as needed for mild constipation.   Synjardy XR 09-998 MG Tb24 Generic drug: Empagliflozin-metFORMIN HCl ER Take 1 tablet by mouth daily. Take 1 tablet by mouth twice a day.   Trulicity 3 HU/7.6LY Sopn Generic drug: Dulaglutide Inject 3 mg as directed once a week.   Vitamin D (Ergocalciferol) 1.25 MG (50000 UNIT) Caps capsule Commonly known as: DRISDOL Take 50,000 Units by mouth every 7 (seven) days.        Disposition and follow-up:   Kevin Soto was discharged from [facility] in Stable condition.  At the hospital follow up visit please address:  1.  Follow-up:  Follow BP and symptoms of hypotension; recommend reassessing need for midodrine and restarting BP medications Patient to continue HD though IJ catheter in the setting of progression of kidney disease Follow BMP for electrolyte disturbances in the setting of hemodialysis Follow CBC studies for downtrend in WBC after hospital discharge; consider blood cultures, antibiotics, and CT abdomen imaging in patient is febrile and uptrending leukocytosis  2.  Labs / imaging needed at time of follow-up: cbc, BMP  3.  Pending labs/ test needing follow-up: None  4.  Medication Changes  STOPPED  - Coreg, Lisinopril, HCTZ in the setting of hypotension until reassessed post hospital discharge  - Flomax     MODIFIED  - Midodrine '10mg'$  TID   Follow-up Appointments:  Follow-up Information     Romana Juniper, MD Follow up.   Why: for hospital follow up Contact information: [place] [address] [phone]                 Hospital Course  by problem list: [name] is a 61 yo man with ESRD on TTS dialysis, CAD, previous CVA, T2DM, admitted on 7/16 for sepsis, requiring transfer to ICU for vasopressor support, now stable for discharge with the following hospital course  Shock Hypotension Sepsis with unclear infectious etiology. Patient presented with hypotension, leukocytosis (25.8) with neutrophil predominance, diarrhea, and generalized weakness, requiring vasopressor support in ICU. Patient received broad spectrum antibiotic treatment ; initially vancomyciin and cefepime, then transitioned to cefepime monotherapy for a total of 5 days. Blood cultures were negative, with no growth to date. GI panel and C diff testing was negative as well. Given persistent hypotension after ICU, AM cortisol was done and resulted in 23.5, ruling out adrenal insufficiency. Patient has been off Cefepime and continues to be afebrile, improving clinically, and with downtrending leukocytosis. Patient is also back to his baseline and safe for discharge with TID 10 mg midodrine for pressure support. This is a new requirement for him, as he was only requiring it on HD days prior to this admission. Will also consider CT abdomen re-imaging in the setting of new fever as on 09/2021 admission patient was noted to have perinephric stranding with no fluid collections. Patient was not scanned during this hospitalization 2/2 unstable vital signs.  Anti-hypertensive medications and Flomax were held during this admission in the setting of hypotension. Recommend monitoring BP and reassessing need for TID midodrine daily and restarting outpatient antihypertensive regimen.  ESRD Anemia Patient currently on HD through R upper chest catheter on TTS. Extensive work up during that admission was unremarkable; kidney biopsy showed myoglobin casts, 40%  interstitial fibrosis and tubular atrophy and diabetic glomerulopathy, Class IIb. The acute myoglobin casts were thought to be  secondary to statin therapy prior to 09/2021 admissioneduling since last hospitalization on 09/2021. Nephrology was consulted; initially held HD to allow for renal recovery, but given BUN increase, patient was restarted on HD. Patient is also managed with ESA at HD for anemia in setting of kidney disease. Hgb stable during this admission. Patient is to continue TTS HD scheduling; recommend electrolyte monitoring and replacement as needed.   T2DM Chronic, stable during this admission. Patient is to restart his home regimen.  CVA history No acute neurological symptoms other than residual truncal weakness. Continue on ASA  Discharge Subjective: Feeling good, in very good spirits. He has no questions or concerns. Waiting for SNF placement.   Discharge Exam:   Today's Vitals   12/11/21 2128 12/11/21 2212 12/12/21 0542   BP: 115/77  126/63   Pulse: 77  70   Resp: 18  17   Temp: 99 F (37.2 C)  98.5 F (36.9 C)   TempSrc: Oral  Oral   SpO2: 100%  95%   Weight:      Height:      PainSc:  0-No pain     Body mass index is 26.01 kg/m.  Constitutional:well-appearing man sitting in bed, in no acute distress  HENT:  mucous membranes moist Cardiovascular: regular rate and rhythm, no m/r/g, no JVD Pulmonary/Chest: normal work of breathing on room air, lungs clear to auscultation bilaterally. No crackles  Abdominal: soft, non-tender, non-distended. No fluid wave  Neurological: alert & oriented x 3 MSK: no gross abnormalities. No pitting edema Skin: warm and dry Psych: Normal mood and affect  Pertinent Labs, Studies, and Procedures:     Latest Ref Rng & Units 12/12/2021    3:05 AM 12/10/2021    2:39 AM 12/09/2021    1:31 AM  CBC  WBC 4.0 - 10.5 K/uL 13.4  12.1  14.7   Hemoglobin 13.0 - 17.0 g/dL 7.5  7.9  7.8   Hematocrit 39.0 - 52.0 % 22.6  23.2  22.6   Platelets 150 - 400 K/uL 398  317  337        Latest Ref Rng & Units 12/12/2021    3:05 AM 12/11/2021    1:31 AM 12/10/2021    2:39 AM   CMP  Glucose 70 - 99 mg/dL 128  137  178   BUN 8 - 23 mg/dL '22  19  12   '$ Creatinine 0.61 - 1.24 mg/dL 2.72  2.77  2.12   Sodium 135 - 145 mmol/L 138  138  135   Potassium 3.5 - 5.1 mmol/L 3.4  3.2  3.0   Chloride 98 - 111 mmol/L 104  102  99   CO2 22 - 32 mmol/L '27  27  28   '$ Calcium 8.9 - 10.3 mg/dL 8.8  8.5  8.4     DG Abd 1 View  Result Date: 12/03/2021 CLINICAL DATA:  Fecal incontinence.  Diarrhea EXAM: ABDOMEN - 1 VIEW COMPARISON:  CT 09/27/2021 FINDINGS: No bowel dilatation to suggest obstruction. No significant formed stool in the colon. There are vascular calcifications. Brachytherapy seeds in the prostate. No visualized radiopaque calculi. IMPRESSION: Nonobstructive bowel gas pattern. No significant formed stool in the colon. Electronically Signed   By: Keith Rake M.D.   On: 12/03/2021 17:11   DG Chest Port 1 View  Result Date: 12/03/2021 CLINICAL DATA:  Weakness EXAM: PORTABLE CHEST 1 VIEW COMPARISON:  09/27/2021 FINDINGS: Interval placement of right IJ approach hemodialysis catheter with distal tip terminating at the level of the right atrium. The heart size and mediastinal contours are within normal limits. Low lung volumes. No focal airspace consolidation, pleural effusion, or pneumothorax. The visualized skeletal structures are unremarkable. IMPRESSION: Low lung volumes without acute cardiopulmonary findings. Electronically Signed   By: Davina Poke D.O.   On: 12/03/2021 14:09     Discharge Instructions: Discharge Instructions     Diet - low sodium heart healthy   Complete by: As directed    Increase activity slowly   Complete by: As directed    Dear Mr. Schaner,  You were admitted to the hospital because you had an infection that caused you to have very low blood pressures. Because of this, you were admitted to the intensive care unit and given medication to keep your blood pressure up. You were also given antibiotic medication to cover your infection. You  completed a 5 day antibiotic course; your white cell count has gone down significantly and overall your vital signs have been stable. You are being discharged from the hospital to go to a skilled nursing home facility to work on physical therapy.   We have stopped your blood pressure medications as your blood pressure was too low during your hospitalization. We  have increased the dosing of a medication called midodrine, as this helps keeps your blood pressures up. Please continue drinking and eating to make sure your blood pressure stays stable.  The kidney doctors also saw you while you were here. You were continued on your hemodialysis schedule without complications. You should continue going to HD and let your doctors know if you notice any changes in tenderness, redness, or oozing out of the catheter on your chest.  Please take care,  Your Internal Medicine inpatient team [facility]    Signed: Romana Juniper, MD [facility] - PGY1 Pager: [phone] 12/12/2021, 1:12 PM

## 2021-12-12 NOTE — Progress Notes (Signed)
Received patient in bed, alert and oriented. Informed consent signed and in chart.  Time tx completed:1745  HD treatment completed. Patient tolerated well. Catheter without signs and symptoms of complications. Patient transported back to the room, alert and orient and in no acute distress. Report given to bedside RN.  Total UF removed:0  Medication given:  Post HD VS:121/65,98.9,99% 20,  Post HD weight: 87.9kg

## 2021-12-12 NOTE — TOC Transition Note (Addendum)
Transition of Care [facility]) - CM/SW Discharge Note   Patient Details  Name: Kevin Soto MRN: [id number] Date of Birth: July 09, 1960  [facility] CM/SW Contact:  Tresa Endo Phone Number: 12/12/2021, 2:36 PM   Clinical Narrative:    Patient will DC to: [facility] Anticipated DC date: 12/12/2021 Family notified: Pt Sister Transport by: Corey Harold   Per MD patient ready for DC to [facility] [phone]. RN to call report prior to discharge [phone]). RN, patient, patient's family, and facility notified of DC. Discharge Summary and FL2 sent to facility. DC packet on chart. Ambulance transport requested for patient.   CSW will sign off for now as social work intervention is no longer needed. Please consult [place] again if new needs arise.       Barriers to Discharge: Ship broker, SNF Pending bed offer, Continued Medical Work up   Patient Goals and CMS Choice Patient states their goals for this hospitalization and ongoing recovery are:: Pt states his goal is to be able to go home. CMS Medicare.gov Compare Post Acute Care list provided to:: Patient Choice offered to / list presented to : Patient, Sibling  Discharge Placement                       Discharge Plan and Services     Post Acute Care Choice: Skilled Nursing Facility                               Social Determinants of Health (SDOH) Interventions     Readmission Risk Interventions     No data to display

## 2021-12-12 NOTE — Progress Notes (Signed)
Kevin Soto Progress Note  Subjective: pt seen in room, no c/o's today, accepted to SNF  Vitals:   12/11/21 1537 12/11/21 2128 12/12/21 0542 12/12/21 0720  BP: 118/69 115/77 126/63 (!) 150/77  Pulse: 82 77 70 (!) 58  Resp: '16 18 17 16  '$ Temp: 99.1 F (37.3 C) 99 F (37.2 C) 98.5 F (36.9 C) 98.8 F (37.1 C)  TempSrc: Oral Oral Oral Oral  SpO2: 99% 100% 95% 98%  Weight:      Height:        Exam: Gen: pleasant older male, NAD CV: normal rate and regular rhythm, no m/r/g. Pulm: CTABL, normal WOB on RA. Abdomen: soft, nondistended, nontender, normoactive BS. MSK: no peripheral edema noted.  Neuro: AAOx3, no focal deficits noted.  HD access: RIJ TDC in place    OP HD: TTS [facility]  AKI  4h  400/600  4K/2.25 bath  88.5kg  TDC  Hep none - mircera 30 q2, has not started - iron sucrose 100 mg qhd thru 8/08 - last HD 7/13, post HD 86kg   CXR 7/16 - IMPRESSION: Low lung volumes without acute cardiopulmonary findings.     Na 138  K 3.2  CO2 27  BUN 19  creat 2.77   Assessment/ Plan: Septic/hypovolemic shock - unclear source of infection. CXR, bcx's, GI panel and Cdif all negative. Now off vasopressors and sp IV antibiotics. Per pmd pt has SNF bed and will be dc'd after HD today.  ESRD - has been on dialysis since 09/2021, had AKI on mild CKD. We held off on dialysis for several days here but the GFR remained low at 12 and BUN and creatinine continued to rise. Likely he is still dependent on dialysis at this point. Next HD 7/27.  BP/ vol - appears euvolemic at this time, close to edw. Will taper midodrine down as tolerated. Wt's peaked at [place], now down to 87kg, make new edw 87kg. Anemia CKD/acute illness - Hemoglobin 7.9  No iron given concerns for infection. Start when he is off ABX.  Rec'd Aranesp 100 mcg IV 07/20 here, cont weekly on Thursdays. MBD ckd - CCa and phos are in range. No vdra or binders at this time.   Rob Karlin Heilman 12/12/2021, 1:10 PM   Recent Labs  Lab  12/07/21 0637 12/08/21 0414 12/10/21 0239 12/11/21 0131 12/12/21 0305  HGB  --    < > 7.9*  --  7.5*  ALBUMIN 2.1*  --   --   --   --   CALCIUM  --    < > 8.4* 8.5* 8.8*  CREATININE  --    < > 2.12* 2.77* 2.72*  K  --    < > 3.0* 3.2* 3.4*   < > = values in this interval not displayed.    No results for input(s): "IRON", "TIBC", "FERRITIN" in the last 168 hours. Inpatient medications:  aspirin EC  81 mg Oral Daily   Chlorhexidine Gluconate Cloth  6 each Topical Daily   Chlorhexidine Gluconate Cloth  6 each Topical Q0600   Chlorhexidine Gluconate Cloth  6 each Topical Q0600   clopidogrel  75 mg Oral Daily   darbepoetin (ARANESP) injection - DIALYSIS  100 mcg Intravenous Q Thu-HD   heparin  5,000 Units Subcutaneous Q8H   insulin aspart  0-6 Units Subcutaneous TID WC   midodrine  5 mg Oral Q8H   pantoprazole  40 mg Oral BID   sodium chloride flush  10-40 mL Intracatheter  Q12H    sodium chloride Stopped (12/04/21 0109)   acetaminophen, sodium chloride flush

## 2021-12-12 NOTE — Progress Notes (Signed)
Mobility Specialist Progress Note   12/12/21 1204  Mobility  Activity Ambulated with assistance in hallway  Level of Assistance +2 (takes two people) (Mod A)  Assistive Device Front wheel walker  Distance Ambulated (ft) 34 ft  Activity Response Tolerated fair  $Mobility charge 1 Mobility    Pre Mobility: 62 HR, 131/67 BP Post Mobility: 72 HR, 115/63 BP  Pt was found in bed and agreeable for mobility. Pt stating he was confused, A&O x2 but no faults throughout session. X1 seated rest break d/t fatigue and slight dizziness, checked BP and vital was stable. Left in chair with alarm on and call bell in reach.   Holland Falling Mobility Specialist MS [facility] #:  [phone] Acute Rehab Office:  [phone]

## 2021-12-12 NOTE — Plan of Care (Signed)

## 2021-12-14 LAB — HEPATITIS B E ANTIGEN: Hep B E Ag: NEGATIVE

## 2021-12-15 LAB — MISC LABCORP TEST (SEND OUT): Labcorp test code: 551745

## 2021-12-18 ENCOUNTER — Encounter: Payer: Medicare Other | Admitting: Student

## 2021-12-18 ENCOUNTER — Encounter: Payer: Self-pay | Admitting: Student

## 2022-01-01 ENCOUNTER — Inpatient Hospital Stay (HOSPITAL_COMMUNITY)
Admission: EM | Admit: 2022-01-01 | Discharge: 2022-01-05 | DRG: 918 | Disposition: A | Discharge status: Home Health | Payer: Medicare Other | Attending: Internal Medicine | Admitting: Internal Medicine

## 2022-01-01 ENCOUNTER — Inpatient Hospital Stay (HOSPITAL_COMMUNITY): Payer: Medicare Other

## 2022-01-01 ENCOUNTER — Encounter (HOSPITAL_COMMUNITY): Payer: Self-pay

## 2022-01-01 ENCOUNTER — Emergency Department (HOSPITAL_COMMUNITY): Payer: Medicare Other

## 2022-01-01 ENCOUNTER — Other Ambulatory Visit: Payer: Self-pay

## 2022-01-01 DIAGNOSIS — E876 Hypokalemia: Secondary | ICD-10-CM | POA: Diagnosis present

## 2022-01-01 DIAGNOSIS — I2510 Atherosclerotic heart disease of native coronary artery without angina pectoris: Secondary | ICD-10-CM | POA: Diagnosis present

## 2022-01-01 DIAGNOSIS — D631 Anemia in chronic kidney disease: Secondary | ICD-10-CM | POA: Diagnosis present

## 2022-01-01 DIAGNOSIS — K828 Other specified diseases of gallbladder: Secondary | ICD-10-CM | POA: Diagnosis present

## 2022-01-01 DIAGNOSIS — I959 Hypotension, unspecified: Secondary | ICD-10-CM | POA: Diagnosis present

## 2022-01-01 DIAGNOSIS — N1832 Chronic kidney disease, stage 3b: Secondary | ICD-10-CM | POA: Diagnosis present

## 2022-01-01 DIAGNOSIS — D649 Anemia, unspecified: Secondary | ICD-10-CM | POA: Diagnosis present

## 2022-01-01 DIAGNOSIS — D72829 Elevated white blood cell count, unspecified: Secondary | ICD-10-CM | POA: Diagnosis not present

## 2022-01-01 DIAGNOSIS — R0602 Shortness of breath: Secondary | ICD-10-CM | POA: Diagnosis not present

## 2022-01-01 DIAGNOSIS — L8990 Pressure ulcer of unspecified site, unspecified stage: Secondary | ICD-10-CM | POA: Insufficient documentation

## 2022-01-01 DIAGNOSIS — E1165 Type 2 diabetes mellitus with hyperglycemia: Secondary | ICD-10-CM | POA: Diagnosis present

## 2022-01-01 DIAGNOSIS — R739 Hyperglycemia, unspecified: Secondary | ICD-10-CM

## 2022-01-01 DIAGNOSIS — T465X1A Poisoning by other antihypertensive drugs, accidental (unintentional), initial encounter: Secondary | ICD-10-CM | POA: Diagnosis present

## 2022-01-01 DIAGNOSIS — Z7982 Long term (current) use of aspirin: Secondary | ICD-10-CM

## 2022-01-01 DIAGNOSIS — E1149 Type 2 diabetes mellitus with other diabetic neurological complication: Secondary | ICD-10-CM | POA: Diagnosis present

## 2022-01-01 DIAGNOSIS — R571 Hypovolemic shock: Secondary | ICD-10-CM | POA: Diagnosis present

## 2022-01-01 DIAGNOSIS — Z79899 Other long term (current) drug therapy: Secondary | ICD-10-CM

## 2022-01-01 DIAGNOSIS — Z7984 Long term (current) use of oral hypoglycemic drugs: Secondary | ICD-10-CM

## 2022-01-01 DIAGNOSIS — E785 Hyperlipidemia, unspecified: Secondary | ICD-10-CM | POA: Diagnosis present

## 2022-01-01 DIAGNOSIS — G4733 Obstructive sleep apnea (adult) (pediatric): Secondary | ICD-10-CM | POA: Diagnosis present

## 2022-01-01 DIAGNOSIS — E1122 Type 2 diabetes mellitus with diabetic chronic kidney disease: Secondary | ICD-10-CM | POA: Diagnosis present

## 2022-01-01 DIAGNOSIS — R339 Retention of urine, unspecified: Secondary | ICD-10-CM | POA: Diagnosis present

## 2022-01-01 DIAGNOSIS — E861 Hypovolemia: Secondary | ICD-10-CM | POA: Diagnosis not present

## 2022-01-01 DIAGNOSIS — F1010 Alcohol abuse, uncomplicated: Secondary | ICD-10-CM | POA: Diagnosis present

## 2022-01-01 DIAGNOSIS — E1169 Type 2 diabetes mellitus with other specified complication: Secondary | ICD-10-CM | POA: Diagnosis not present

## 2022-01-01 DIAGNOSIS — I952 Hypotension due to drugs: Secondary | ICD-10-CM | POA: Diagnosis not present

## 2022-01-01 DIAGNOSIS — Z888 Allergy status to other drugs, medicaments and biological substances status: Secondary | ICD-10-CM

## 2022-01-01 DIAGNOSIS — I152 Hypertension secondary to endocrine disorders: Secondary | ICD-10-CM | POA: Diagnosis present

## 2022-01-01 DIAGNOSIS — Z8673 Personal history of transient ischemic attack (TIA), and cerebral infarction without residual deficits: Secondary | ICD-10-CM | POA: Diagnosis not present

## 2022-01-01 DIAGNOSIS — Z20822 Contact with and (suspected) exposure to covid-19: Secondary | ICD-10-CM | POA: Diagnosis present

## 2022-01-01 DIAGNOSIS — Z841 Family history of disorders of kidney and ureter: Secondary | ICD-10-CM

## 2022-01-01 DIAGNOSIS — I9589 Other hypotension: Secondary | ICD-10-CM

## 2022-01-01 DIAGNOSIS — Z992 Dependence on renal dialysis: Secondary | ICD-10-CM | POA: Diagnosis not present

## 2022-01-01 DIAGNOSIS — E872 Acidosis, unspecified: Secondary | ICD-10-CM | POA: Diagnosis present

## 2022-01-01 DIAGNOSIS — Z8711 Personal history of peptic ulcer disease: Secondary | ICD-10-CM

## 2022-01-01 DIAGNOSIS — C61 Malignant neoplasm of prostate: Secondary | ICD-10-CM | POA: Diagnosis present

## 2022-01-01 DIAGNOSIS — Z8042 Family history of malignant neoplasm of prostate: Secondary | ICD-10-CM

## 2022-01-01 DIAGNOSIS — K2980 Duodenitis without bleeding: Secondary | ICD-10-CM | POA: Diagnosis present

## 2022-01-01 DIAGNOSIS — T465X5A Adverse effect of other antihypertensive drugs, initial encounter: Secondary | ICD-10-CM | POA: Diagnosis not present

## 2022-01-01 DIAGNOSIS — I255 Ischemic cardiomyopathy: Secondary | ICD-10-CM | POA: Diagnosis present

## 2022-01-01 DIAGNOSIS — I120 Hypertensive chronic kidney disease with stage 5 chronic kidney disease or end stage renal disease: Secondary | ICD-10-CM | POA: Diagnosis not present

## 2022-01-01 DIAGNOSIS — E1159 Type 2 diabetes mellitus with other circulatory complications: Secondary | ICD-10-CM | POA: Diagnosis present

## 2022-01-01 DIAGNOSIS — N186 End stage renal disease: Secondary | ICD-10-CM | POA: Diagnosis not present

## 2022-01-01 DIAGNOSIS — N2581 Secondary hyperparathyroidism of renal origin: Secondary | ICD-10-CM | POA: Diagnosis present

## 2022-01-01 DIAGNOSIS — Z7985 Long-term (current) use of injectable non-insulin antidiabetic drugs: Secondary | ICD-10-CM

## 2022-01-01 DIAGNOSIS — Z833 Family history of diabetes mellitus: Secondary | ICD-10-CM

## 2022-01-01 DIAGNOSIS — Z8249 Family history of ischemic heart disease and other diseases of the circulatory system: Secondary | ICD-10-CM

## 2022-01-01 LAB — TYPE AND SCREEN
ABO/RH(D): O POS
Antibody Screen: NEGATIVE

## 2022-01-01 LAB — COMPREHENSIVE METABOLIC PANEL
ALT: 17 U/L (ref 0–44)
AST: 27 U/L (ref 15–41)
Albumin: 3.7 g/dL (ref 3.5–5.0)
Alkaline Phosphatase: 116 U/L (ref 38–126)
Anion gap: 25 — ABNORMAL HIGH (ref 5–15)
BUN: 28 mg/dL — ABNORMAL HIGH (ref 8–23)
CO2: 20 mmol/L — ABNORMAL LOW (ref 22–32)
Calcium: 10.2 mg/dL (ref 8.9–10.3)
Chloride: 91 mmol/L — ABNORMAL LOW (ref 98–111)
Creatinine, Ser: 5.72 mg/dL — ABNORMAL HIGH (ref 0.61–1.24)
GFR, Estimated: 10 mL/min — ABNORMAL LOW (ref >60)
Glucose, Bld: 297 mg/dL — ABNORMAL HIGH (ref 70–99)
Potassium: 3.8 mmol/L (ref 3.5–5.1)
Sodium: 136 mmol/L (ref 135–145)
Total Bilirubin: 0.8 mg/dL (ref 0.3–1.2)
Total Protein: 8.1 g/dL (ref 6.5–8.1)

## 2022-01-01 LAB — CBC WITH DIFFERENTIAL/PLATELET
Abs Immature Granulocytes: 0.11 10*3/uL — ABNORMAL HIGH (ref 0.00–0.07)
Basophils Absolute: 0.1 10*3/uL (ref 0.0–0.1)
Basophils Relative: 0 %
Eosinophils Absolute: 0 10*3/uL (ref 0.0–0.5)
Eosinophils Relative: 0 %
HCT: 39.6 % (ref 39.0–52.0)
Hemoglobin: 12.6 g/dL — ABNORMAL LOW (ref 13.0–17.0)
Immature Granulocytes: 1 %
Lymphocytes Relative: 13 %
Lymphs Abs: 2.3 10*3/uL (ref 0.7–4.0)
MCH: 28.9 pg (ref 26.0–34.0)
MCHC: 31.8 g/dL (ref 30.0–36.0)
MCV: 90.8 fL (ref 80.0–100.0)
Monocytes Absolute: 1.4 10*3/uL — ABNORMAL HIGH (ref 0.1–1.0)
Monocytes Relative: 7 %
Neutro Abs: 14.7 10*3/uL — ABNORMAL HIGH (ref 1.7–7.7)
Neutrophils Relative %: 79 %
Platelets: 280 10*3/uL (ref 150–400)
RBC: 4.36 MIL/uL (ref 4.22–5.81)
RDW: 18 % — ABNORMAL HIGH (ref 11.5–15.5)
WBC: 18.6 10*3/uL — ABNORMAL HIGH (ref 4.0–10.5)
nRBC: 0.5 % — ABNORMAL HIGH (ref 0.0–0.2)

## 2022-01-01 LAB — PROTIME-INR
INR: 1 (ref 0.8–1.2)
Prothrombin Time: 13.4 seconds (ref 11.4–15.2)

## 2022-01-01 LAB — GLUCOSE, CAPILLARY
Glucose-Capillary: 113 mg/dL — ABNORMAL HIGH (ref 70–99)
Glucose-Capillary: 153 mg/dL — ABNORMAL HIGH (ref 70–99)
Glucose-Capillary: 106 mg/dL — ABNORMAL HIGH (ref 70–99)

## 2022-01-01 LAB — URINALYSIS, ROUTINE W REFLEX MICROSCOPIC
Bacteria, UA: NONE SEEN
Glucose, UA: 150 mg/dL — AB
Hgb urine dipstick: NEGATIVE
Ketones, ur: NEGATIVE mg/dL
Nitrite: NEGATIVE
Protein, ur: 100 mg/dL — AB
Specific Gravity, Urine: 1.023 (ref 1.005–1.030)
pH: 5 (ref 5.0–8.0)

## 2022-01-01 LAB — LIPASE, BLOOD: Lipase: 31 U/L (ref 11–51)

## 2022-01-01 LAB — I-STAT CHEM 8, ED
BUN: 31 mg/dL — ABNORMAL HIGH (ref 8–23)
Calcium, Ion: 1.05 mmol/L — ABNORMAL LOW (ref 1.15–1.40)
Chloride: 95 mmol/L — ABNORMAL LOW (ref 98–111)
Creatinine, Ser: 5.9 mg/dL — ABNORMAL HIGH (ref 0.61–1.24)
Glucose, Bld: 287 mg/dL — ABNORMAL HIGH (ref 70–99)
HCT: 43 % (ref 39.0–52.0)
Hemoglobin: 14.6 g/dL (ref 13.0–17.0)
Potassium: 3.8 mmol/L (ref 3.5–5.1)
Sodium: 136 mmol/L (ref 135–145)
TCO2: 23 mmol/L (ref 22–32)

## 2022-01-01 LAB — BRAIN NATRIURETIC PEPTIDE: B Natriuretic Peptide: 39.2 pg/mL (ref 0.0–100.0)

## 2022-01-01 LAB — HEMOGLOBIN A1C
Hgb A1c MFr Bld: 5.3 % (ref 4.8–5.6)
Mean Plasma Glucose: 105.41 mg/dL

## 2022-01-01 LAB — ABO/RH: ABO/RH(D): O POS

## 2022-01-01 LAB — LACTIC ACID, PLASMA
Lactic Acid, Venous: >9 mmol/L (ref 0.5–1.9)
Lactic Acid, Venous: 4 mmol/L (ref 0.5–1.9)
Lactic Acid, Venous: 2.4 mmol/L (ref 0.5–1.9)

## 2022-01-01 LAB — TROPONIN I (HIGH SENSITIVITY)
Troponin I (High Sensitivity): 26 ng/L — ABNORMAL HIGH (ref <18)
Troponin I (High Sensitivity): 34 ng/L — ABNORMAL HIGH (ref <18)
Troponin I (High Sensitivity): 37 ng/L — ABNORMAL HIGH (ref <18)

## 2022-01-01 LAB — POC OCCULT BLOOD, ED: Fecal Occult Bld: NEGATIVE

## 2022-01-01 LAB — MRSA NEXT GEN BY PCR, NASAL: MRSA by PCR Next Gen: NOT DETECTED

## 2022-01-01 LAB — SARS CORONAVIRUS 2 BY RT PCR: SARS Coronavirus 2 by RT PCR: NEGATIVE

## 2022-01-01 MED ORDER — PANTOPRAZOLE 80MG IVPB - SIMPLE MED
80.0000 mg | Freq: Once | INTRAVENOUS | Status: AC
  Administered 2022-01-01: 80 mg via INTRAVENOUS
  Filled 2022-01-01: qty 80

## 2022-01-01 MED ORDER — INSULIN ASPART 100 UNIT/ML IJ SOLN
0.0000 [IU] | INTRAMUSCULAR | Status: DC
  Administered 2022-01-01: 2 [IU] via SUBCUTANEOUS
  Administered 2022-01-02 (×2): 1 [IU] via SUBCUTANEOUS
  Administered 2022-01-03: 2 [IU] via SUBCUTANEOUS
  Administered 2022-01-04 – 2022-01-05 (×2): 1 [IU] via SUBCUTANEOUS

## 2022-01-01 MED ORDER — ONDANSETRON HCL 4 MG/2ML IJ SOLN: 4.0000 mg | Freq: Four times a day (QID) | INTRAMUSCULAR | Status: DC | PRN

## 2022-01-01 MED ORDER — FLUTICASONE PROPIONATE 50 MCG/ACT NA SUSP
2.0000 | Freq: Every day | NASAL | Status: DC
  Administered 2022-01-01 – 2022-01-05 (×5): 2 via NASAL
  Filled 2022-01-01: qty 16

## 2022-01-01 MED ORDER — SODIUM CHLORIDE 0.9 % IV BOLUS
500.0000 mL | Freq: Once | INTRAVENOUS | Status: AC
  Administered 2022-01-01: 500 mL via INTRAVENOUS

## 2022-01-01 MED ORDER — VANCOMYCIN HCL IN DEXTROSE 1-5 GM/200ML-% IV SOLN: 1000.0000 mg | Freq: Once | INTRAVENOUS | Status: DC

## 2022-01-01 MED ORDER — SODIUM CHLORIDE 0.9 % IV SOLN
1.0000 g | INTRAVENOUS | Status: DC
  Filled 2022-01-01: qty 10

## 2022-01-01 MED ORDER — VANCOMYCIN HCL 2000 MG/400ML IV SOLN
2000.0000 mg | Freq: Once | INTRAVENOUS | Status: AC
  Administered 2022-01-01: 2000 mg via INTRAVENOUS
  Filled 2022-01-01: qty 400

## 2022-01-01 MED ORDER — SODIUM CHLORIDE 0.9 % IV BOLUS
500.0000 mL | Freq: Once | INTRAVENOUS | Status: AC
Start: 2022-01-01 — End: 2022-01-01
  Administered 2022-01-01: 500 mL via INTRAVENOUS

## 2022-01-01 MED ORDER — PANTOPRAZOLE SODIUM 40 MG IV SOLR: 40.0000 mg | Freq: Two times a day (BID) | INTRAVENOUS | Status: DC

## 2022-01-01 MED ORDER — VANCOMYCIN HCL IN DEXTROSE 1-5 GM/200ML-% IV SOLN: 1000.0000 mg | INTRAVENOUS | Status: DC

## 2022-01-01 MED ORDER — POLYETHYLENE GLYCOL 3350 17 G PO PACK: 17.0000 g | PACK | Freq: Every day | ORAL | Status: DC | PRN

## 2022-01-01 MED ORDER — ORAL CARE MOUTH RINSE: 15.0000 mL | OROMUCOSAL | Status: DC | PRN

## 2022-01-01 MED ORDER — PANTOPRAZOLE INFUSION (NEW) - SIMPLE MED
8.0000 mg/h | INTRAVENOUS | Status: DC
  Administered 2022-01-01 (×2): 8 mg/h via INTRAVENOUS
  Filled 2022-01-01: qty 80
  Filled 2022-01-01: qty 100
  Filled 2022-01-01: qty 80

## 2022-01-01 MED ORDER — ONDANSETRON HCL 4 MG/2ML IJ SOLN
4.0000 mg | Freq: Once | INTRAMUSCULAR | Status: AC
  Administered 2022-01-01: 4 mg via INTRAVENOUS
  Filled 2022-01-01: qty 2

## 2022-01-01 MED ORDER — CLOPIDOGREL BISULFATE 75 MG PO TABS
75.0000 mg | ORAL_TABLET | Freq: Every day | ORAL | Status: DC
  Administered 2022-01-01: 75 mg via ORAL
  Filled 2022-01-01: qty 1

## 2022-01-01 MED ORDER — CHLORHEXIDINE GLUCONATE CLOTH 2 % EX PADS
6.0000 | MEDICATED_PAD | Freq: Every day | CUTANEOUS | Status: DC
  Administered 2022-01-01 – 2022-01-05 (×5): 6 via TOPICAL

## 2022-01-01 MED ORDER — ASPIRIN 81 MG PO TBEC
81.0000 mg | DELAYED_RELEASE_TABLET | Freq: Every day | ORAL | Status: DC
  Administered 2022-01-01 – 2022-01-04 (×4): 81 mg via ORAL
  Filled 2022-01-01 (×5): qty 1

## 2022-01-01 MED ORDER — SODIUM CHLORIDE 0.9 % IV SOLN
2.0000 g | Freq: Once | INTRAVENOUS | Status: AC
  Administered 2022-01-01: 2 g via INTRAVENOUS
  Filled 2022-01-01: qty 12.5

## 2022-01-01 MED ORDER — HEPARIN SODIUM (PORCINE) 5000 UNIT/ML IJ SOLN
5000.0000 [IU] | Freq: Three times a day (TID) | INTRAMUSCULAR | Status: DC
  Administered 2022-01-01 – 2022-01-05 (×11): 5000 [IU] via SUBCUTANEOUS
  Filled 2022-01-01 (×11): qty 1

## 2022-01-01 MED ORDER — CALCIUM GLUCONATE-NACL 1-0.675 GM/50ML-% IV SOLN
1.0000 g | Freq: Once | INTRAVENOUS | Status: AC
  Administered 2022-01-01: 1000 mg via INTRAVENOUS
  Filled 2022-01-01: qty 50

## 2022-01-01 MED ORDER — METRONIDAZOLE 500 MG/100ML IV SOLN
500.0000 mg | Freq: Once | INTRAVENOUS | Status: AC
Start: 2022-01-01 — End: 2022-01-01
  Administered 2022-01-01: 500 mg via INTRAVENOUS
  Filled 2022-01-01: qty 100

## 2022-01-01 MED ORDER — DOCUSATE SODIUM 100 MG PO CAPS: 100.0000 mg | ORAL_CAPSULE | Freq: Two times a day (BID) | ORAL | Status: DC | PRN

## 2022-01-01 NOTE — ED Triage Notes (Signed)
Pt accompanied by family member who reports multiple episodes of coffee ground emesis this morning, hx of same last month. Pt very pale, diaphoretic in triage, unable to sit up in wheelchair.

## 2022-01-01 NOTE — Consult Note (Signed)
ESRD Consult Note  Assessment/Recommendations:  ESRD:  -outpatient orders: [facility], TTS. 4hrs. F180. Flow rates: 400/600. EDW 79.3kg. 4K, 2.25Cal. Meds: mircera 162mg q2weeks (last dose 8/10). Venofer load '100mg'$  qtreatment (has 5 more doses left at the time of admit). -No urgent indications for RRT today, will reassess tomorrow before ordering his next treatment  Shock -etiology? Possibly hypovolemic in nature given exam, agree with fluids for now  Lactic acidosis -receiving fluids  Volume: EDW 79.3kg (recently lowered given that he was under his EDW). Will UF as tolerated. Monitor strict I/O if possible.  Anemia of Chronic Kidney Disease: Hemoglobin 12.6 on presentation, now 14.6, likely hemoconcentrated. Received ESA last on 8/10, not due for it until 8/24. Receiving Fe load, will recheck his Fe studies and resume Fe if Hgb falls below goal.   Secondary Hyperparathyroidism/Hyperphosphatemia: not on binders, VDRA, or Vit D analogues. Monitor for now. Renal diet recommended  Additional recommendations: - Dose all meds for creatinine clearance < 10 ml/min  - Unless absolutely necessary, no MRIs with gadolinium.  - Implement save arm precautions.  Prefer needle sticks in the dorsum of the hands or wrists.  No blood pressure measurements in arm. - If blood transfusion is requested during hemodialysis sessions, please alert uKoreaprior to the session.  - If a hemodialysis catheter line culture is requested, please alert uKoreaas only hemodialysis nurses are able to collect those specimens.   Recommendations were discussed with the primary team.   History of Present Illness: Kevin MARCELLOis a/an 61y.o. male with a past medical history of ESRD (started dialysis in May 2023 when admitted for AKI), CAD, DM2, h/o CVA who presents with hypotension and possible hematemesis, found to be hypotensive which seems to be responding to isotonic fluids. Had a similar presentation of this recently and  hypotension was presumed to be secondary to sepsis. Had been weak at home and had a fall today. In ER, CXR was clear, does have lactic acidosis. Patient is a rather poor historian. He denies any fevers, chest pain, SOB, dysuria, hematuria, swelling. He reports that he typically urinates about 2-3 times per day and reports it as a "full" amount.    Medications:  Current Facility-Administered Medications  Medication Dose Route Frequency Provider Last Rate Last Admin   calcium gluconate 1 g/ 50 mL sodium chloride IVPB  1 g Intravenous Once CNoemi ChapelP, DO       [START ON 01/02/2022] ceFEPIme (MAXIPIME) 1 g in sodium chloride 0.9 % 100 mL IVPB  1 g Intravenous Q24H OBertis Ruddy RPH       docusate sodium (COLACE) capsule 100 mg  100 mg Oral BID PRN Ollis, Brandi L, NP       heparin injection 5,000 Units  5,000 Units Subcutaneous Q8H Ollis, Brandi L, NP       ondansetron (ZOFRAN) injection 4 mg  4 mg Intravenous Q6H PRN ODonita Brooks NP       [START ON 01/05/2022] pantoprazole (PROTONIX) injection 40 mg  40 mg Intravenous Q12H Long, JWonda Olds MD       pantoprozole (PROTONIX) 80 mg /NS 100 mL infusion  8 mg/hr Intravenous Continuous Long, JWonda Olds MD 10 mL/hr at 01/01/22 1326 8 mg/hr at 01/01/22 1326   polyethylene glycol (MIRALAX / GLYCOLAX) packet 17 g  17 g Oral Daily PRN ODonita Brooks NP       [START ON 01/02/2022] vancomycin (VANCOCIN) IVPB 1000 mg/200 mL premix  1,000 mg Intravenous Q  T,Th,Sa-HD Bertis Ruddy, RPH       vancomycin Alcus Dad) IVPB 2000 mg/400 mL  2,000 mg Intravenous Once Bertis Ruddy, RPH 200 mL/hr at 01/01/22 1502 2,000 mg at 01/01/22 1502   Current Outpatient Medications  Medication Sig Dispense Refill   acetaminophen (TYLENOL) 325 MG tablet Take 2 tablets (650 mg total) by mouth every 6 (six) hours as needed for mild pain (or Fever >/= 101). 30 tablet 0   aspirin EC 81 MG tablet Take 1 tablet (81 mg total) by mouth daily. 30 tablet 11   clopidogrel (PLAVIX)  75 MG tablet Take 1 tablet (75 mg total) by mouth daily. 90 tablet 3   Darbepoetin Alfa (ARANESP) 100 MCG/0.5ML SOSY injection Inject 0.5 mLs (100 mcg total) into the vein every Thursday with hemodialysis. 4.2 mL    dorzolamide-timolol (COSOPT) 22.3-6.8 MG/ML ophthalmic solution Place 1 drop into both eyes daily.     Dulaglutide (TRULICITY) 3 JO/8.4ZY SOPN Inject 3 mg as directed once a week. 2 mL 2   Empagliflozin-metFORMIN HCl ER (SYNJARDY XR) 09-998 MG TB24 Take 1 tablet by mouth daily. Take 1 tablet by mouth twice a day.     fluticasone (FLONASE) 50 MCG/ACT nasal spray Place 2 sprays into both nostrils daily. 16 g 1   midodrine (PROAMATINE) 5 MG tablet Take 1 tablet (5 mg total) by mouth every 8 (eight) hours.     nitroGLYCERIN (NITROSTAT) 0.4 MG SL tablet Place 1 tablet (0.4 mg total) under the tongue every 5 (five) minutes as needed. (Patient taking differently: Place 0.4 mg under the tongue every 5 (five) minutes as needed for chest pain.) 100 tablet 1   pantoprazole (PROTONIX) 40 MG tablet Take 1 tablet (40 mg total) by mouth 2 (two) times daily. 60 tablet 0   senna-docusate (SENOKOT-S) 8.6-50 MG tablet Take 1 tablet by mouth at bedtime as needed for mild constipation. 30 tablet 0   Vitamin D, Ergocalciferol, (DRISDOL) 1.25 MG (50000 UNIT) CAPS capsule Take 50,000 Units by mouth every 7 (seven) days.       ALLERGIES Clonidine derivatives  MEDICAL HISTORY Past Medical History:  Diagnosis Date   Anemia    CAD (coronary artery disease)    cath 8/12:  LM ok, mLAD occluded with trivial collats from right AM and dRCA, mDx (small) 80%, RI 25%, tiny branch of OM 90%, mAM 30-40%, RCA sub-totally occluded, then 80%, EF 40%, inf HK.  He underwent PCI with [name] with placement of a Promus DES to the mRCA x 2   Chronic kidney disease    stage 3 per chart   Diabetes mellitus    Dyslipidemia    ED (erectile dysfunction)    Gait instability 02/27/2011   History of alcohol abuse    History  of stomach ulcers    History of stroke    evidence of CVA on MRI in past   Hyperlipidemia    Hypertension    Ischemic cardiomyopathy    echo 7/12: EF 40-45%, septal, apical, inf basal HK, mod LVH, mild MR, mild LAE.  EF 55% by echo 2015   Macular hemorrhage    Prostate cancer ([place])    Sleep apnea    no c-pap; wife denies   Vision, loss, sudden 02/27/2011     SOCIAL HISTORY Social History   Socioeconomic History   Marital status: Married    Spouse name: Aleta   Number of children: 0   Years of education: Not on file   Highest education  level: Not on file  Occupational History   Occupation: disabled  Tobacco Use   Smoking status: Never   Smokeless tobacco: Never  Vaping Use   Vaping Use: Never used  Substance and Sexual Activity   Alcohol use: Yes    Alcohol/week: 4.0 standard drinks of alcohol    Types: 2 Cans of beer, 2 Shots of liquor per week   Drug use: No   Sexual activity: Not on file  Other Topics Concern   Not on file  Social History Narrative   [place] in [place] with his cousinNo childrenExercises 3 times a week   Right handed    Social Determinants of Health   Financial Resource Strain: Not on file  Food Insecurity: Not on file  Transportation Needs: Not on file  Physical Activity: Not on file  Stress: Not on file  Social Connections: Not on file  Intimate Partner Violence: Not on file     FAMILY HISTORY Family History  Adopted: Yes  Problem Relation Age of Onset   Heart attack Mother        Died 61   Heart failure Mother    Diabetes Mother    Heart attack Father    Heart failure Father    Diabetes Father    Prostate cancer Father    Cancer Father        Prostate   Heart disease Sister        s/p CABG x3 in her mid 40's   Crohn's disease Sister    Crohn's disease Other        niece   Kidney disease Brother      Review of Systems: 12 systems were reviewed and negative except per HPI  Physical Exam: Vitals:   01/01/22 1415  01/01/22 1445  BP: 96/68 92/63  Pulse: 100 (!) 103  Resp: 15 17  Temp:    SpO2: 94% 98%   No intake/output data recorded. No intake or output data in the 24 hours ending 01/01/22 1530 General: well-appearing, no acute distress, laying flat in stretcher HEENT: anicteric sclera, MMM CV: normal rate, no murmurs, no edema Lungs: bilateral chest rise, normal wob Abd: soft, non-tender, non-distended Skin: no visible lesions or rashes, poor skin turgor Neuro: normal speech, no gross focal deficits  Dialysis [facility]: [facility]  Test Results Reviewed Lab Results  Component Value Date   NA 136 01/01/2022   K 3.8 01/01/2022   CL 95 (L) 01/01/2022   CO2 20 (L) 01/01/2022   BUN 31 (H) 01/01/2022   CREATININE 5.90 (H) 01/01/2022   GFR 71.22 01/08/2011   CALCIUM 10.2 01/01/2022   ALBUMIN 3.7 01/01/2022   PHOS 3.4 12/05/2021    I have reviewed relevant outside healthcare records

## 2022-01-01 NOTE — Progress Notes (Signed)
Pt brought pants, shirt, undergarments, and shoes at bedside in pt belonging bag.

## 2022-01-01 NOTE — Progress Notes (Signed)
Pharmacy Antibiotic Note  Kevin Soto is a 61 y.o. male admitted on 01/01/2022 presenting with coffee ground emesis, generalized weakness.  Pharmacy has been consulted for cefepime and vancomycin dosing.  ESRD-HD usually TTS  Plan: Vancomycin 2000 mg IV x 1, then 1000 mg IV q HD Cefepime 2g IV x 1, then 1g IV q 24h Monitor iHD schedule, Cx and clinical progression to narrow Vancomycin random level as needed     Temp (24hrs), Avg:98.1 F (36.7 C), Min:98.1 F (36.7 C), Max:98.1 F (36.7 C)  Recent Labs  Lab 01/01/22 1200 01/01/22 1226  WBC 18.6*  --   CREATININE  --  5.90*    CrCl cannot be calculated (Unknown ideal weight.).    Allergies  Allergen Reactions   Clonidine Derivatives Other (See Comments)    Dizziness Drowsiness    Bertis Ruddy, PharmD Clinical Pharmacist ED Pharmacist Phone # [phone] 01/01/2022 1:25 PM

## 2022-01-01 NOTE — H&P (Signed)
NAME:  Kevin Soto, MRN:  [id number], DOB:  09/17/1960, LOS: 0 ADMISSION DATE:  01/01/2022, CONSULTATION DATE:  8/14 REFERRING MD:  Dr. Laverta Baltimore, CHIEF COMPLAINT:  Abdominal Pain   History of Present Illness:  61 y/o M who presented to [facility] on 8/14 with reports of abdominal pain, weakness, fall and emesis.  The patient has had several admissions in the recent months, including May and July. His most recent admission in July was in the setting of hypotension with concern for sepsis but no source was identified. He required vasopressors, & midodrine for hypotension. Cortisol was normal.  He was treated with abx empirically.  Ultimately, he was discharged to rehab. He is T/Th/S HD at baseline.   Patient's sister reports he was discharged home from rehab and was at home for approximately two days.  He reportedly told his sister he felt bad at home on 8/13 and fell later at home.  He subsequently went to bed but was found with his pants part way off. He was found on the side of the bed with his legs hanging off, had to be repositioned in bed. They heard a "thud" this am and had fallen.  He required assistance up but was able to walk with his walker afterward. He has not been using his walker at home.    He returned to the ER on 8/14 with reports of weakness, stomach hurting in his lower abdomen and vomiting.  His sister reports his emesis "looked like blood".  She described it as green-brown.  BP at home after discharge from rehab 95/56, 103/63.  On 8/13 it was in the 70's.  She also reported high blood pressures in HD on Saturday.  She is not clear what meds he has been on as he has been at the rehab, but notes he was not on antihypertensives at hospital discharge but has been on them at home.  He has not eaten food since 8/13.  Reports no diarrhea, swelling, abdominal surgeries, non-smoker, no ETOH, thinks he has lost some weight but not clear on details.  In the ER, he was hypotensive with systolic in  46'E, lactic acid >9 and somewhat altered.  He was treated with IVF bolus with improvement in blood pressure and lactate. CXR negative for edema/infiltrate.  Initial labs - Na 136, K 3.8, Cl 95, glucose 287, BUN 31, cr 5.90, WBC 18.6, Hgb 12.6, and platelets 280. CT ABD/Pelvis pending.  PCCM called for ICU admission.   Pertinent  Medical History  CVA - bilateral cerebellar strokes, on plavix  DM  Prostate CA -s/p seed implants  ED  Anemia  ESRD - on HD HLD  HTN  CAD ICM Sleep Apnea - not on CPAP  Significant Hospital Events: Including procedures, antibiotic start and stop dates in addition to other pertinent events   8/14 Admit with abdominal pain   Interim History / Subjective:  As above  Objective   Blood pressure 101/61, pulse (!) 103, temperature 98.1 F (36.7 C), temperature source Oral, resp. rate 15, SpO2 99 %.       No intake or output data in the 24 hours ending 01/01/22 1603 There were no vitals filed for this visit.  Examination: General: chronically ill appearing adult male lying in bed in NAD HENT: MM pink/moist, fair dentition Lungs: even/non-labored on RA, lungs bilaterally clear with good air entry  Cardiovascular: s1s2 RRR, ST on monitor, BP improved to 70-350'K systolic  Abdomen: soft, non-tender to palpation  Extremities:  warm/dry, no edema, anterior lower legs with old scarring  Neuro: Awake, alert, oriented to self, place, month.  Unable to state date  GU: continues to make urine, last void am 8/14, 1-2x per day normal   Resolved Hospital Problem list     Assessment & Plan:   Hypotension R/O Sepsis  R/O GIB, FOBT Negative Patient back on home antihypertensives, was previously on midodrine.  Unclear if he actually should have been on them vs midodrine on admit.  Additional concern for possible cholecystitis.  -admit to ICU  -assess RUQ [place]  -empiric abx with vanco, cefepime  -follow lactate to clearance  -trend PCT  -assess BC, UA via I/O  cath  -s/p IVF bolus, may need pressors for MAP >65 to ensure adequate perfusion   ESRD Hypocalcemia  HD T/Th/S -Nephrology consulted  -Trend BMP / urinary output -Replace electrolytes as indicated, Ca+ replacement 8/14   Recent Gastritis  EGD with gastritis, H.pylori negative.  Coffee ground resolved with PPI.  Followed by [facility].  -PPI  -monitor with SQ heparin  -clear liquid diet for now   HTN, HLD, ICM, CAD Hx CVA -hold home antihypertensives, ? If he has some component of med mix up. No antihypertensives on discharge med list from hospital.   -tele monitoring  -trend troponin  -plavix + ASA  -attempt to obtain discharge med list from rehab facility   DM II with Hyperglycemia -SSI, sensitve scale  -assess Hgb A1c   OSA  Not on CPAP -monitor    Best Practice (right click and "Reselect all SmartList Selections" daily)  Diet/type: clear liquids DVT prophylaxis: prophylactic heparin  GI prophylaxis: PPI Lines: Dialysis Catheter Foley:  N/A Code Status:  full code Last date of multidisciplinary goals of care discussion: sister updated on plan of care 8/14 per Dr. Carlis Abbott.   Labs   CBC: Recent Labs  Lab 01/01/22 1200 01/01/22 1226  WBC 18.6*  --   NEUTROABS 14.7*  --   HGB 12.6* 14.6  HCT 39.6 43.0  MCV 90.8  --   PLT 280  --     Basic Metabolic Panel: Recent Labs  Lab 01/01/22 1200 01/01/22 1226  NA 136 136  K 3.8 3.8  CL 91* 95*  CO2 20*  --   GLUCOSE 297* 287*  BUN 28* 31*  CREATININE 5.72* 5.90*  CALCIUM 10.2  --    GFR: CrCl cannot be calculated (Unknown ideal weight.). Recent Labs  Lab 01/01/22 1200 01/01/22 1214 01/01/22 1456  WBC 18.6*  --   --   LATICACIDVEN  --  >9.0* 4.0*    Liver Function Tests: Recent Labs  Lab 01/01/22 1200  AST 27  ALT 17  ALKPHOS 116  BILITOT 0.8  PROT 8.1  ALBUMIN 3.7   Recent Labs  Lab 01/01/22 1200  LIPASE 31   No results for input(s): "AMMONIA" in the last 168 hours.  ABG     Component Value Date/Time   TCO2 23 01/01/2022 1226     Coagulation Profile: Recent Labs  Lab 01/01/22 1200  INR 1.0    Cardiac Enzymes: No results for input(s): "CKTOTAL", "CKMB", "CKMBINDEX", "TROPONINI" in the last 168 hours.  HbA1C: Hemoglobin A1C  Date/Time Value Ref Range Status  07/14/2021 09:59 AM 7.2 (A) 4.0 - 5.6 % Final  07/29/2020 12:10 PM 6.5 (A) 4.0 - 5.6 % Final   Hgb A1c MFr Bld  Date/Time Value Ref Range Status  09/28/2021 02:36 AM 7.3 (H) 4.8 - 5.6 %  Final    Comment:    (NOTE) Pre diabetes:          5.7%-6.4%  Diabetes:              >6.4%  Glycemic control for   <7.0% adults with diabetes   02/27/2011 05:23 PM 8.7 (H) <5.7 % Final    Comment:    (NOTE)                                                                       According to the ADA Clinical Practice Recommendations for 2011, when HbA1c is used as a screening test:  >=6.5%   Diagnostic of Diabetes Mellitus           (if abnormal result is confirmed) 5.7-6.4%   Increased risk of developing Diabetes Mellitus References:Diagnosis and Classification of Diabetes Mellitus,Diabetes SFKC,1275,17(GYFVC 1):S62-S69 and Standards of Medical Care in         Diabetes - 2011,Diabetes BSWH,6759,16 (Suppl 1):S11-S61.    CBG: No results for input(s): "GLUCAP" in the last 168 hours.  Review of Systems: Positives in [place]   Gen: Denies fever, chills, weight change, fatigue, night sweats HEENT: Denies blurred vision, double vision, hearing loss, tinnitus, sinus congestion, rhinorrhea, sore throat, neck stiffness, dysphagia PULM: Denies shortness of breath, cough, sputum production, hemoptysis, wheezing CV: Denies chest pain, edema, orthopnea, paroxysmal nocturnal dyspnea, palpitations GI: Denies abdominal pain, nausea, vomiting, diarrhea, hematochezia, melena, constipation, change in bowel habits GU: Denies dysuria, hematuria, polyuria, oliguria, urethral discharge Endocrine: Denies hot or cold  intolerance, polyuria, polyphagia or appetite change Derm: Denies rash, dry skin, scaling or peeling skin change Heme: Denies easy bruising, bleeding, bleeding gums Neuro: Denies headache, numbness, weakness, slurred speech, loss of memory or consciousness   Past Medical History:  He,  has a past medical history of Anemia, CAD (coronary artery disease), Chronic kidney disease, Diabetes mellitus, Dyslipidemia, ED (erectile dysfunction), Gait instability (02/27/2011), History of alcohol abuse, History of stomach ulcers, History of stroke, Hyperlipidemia, Hypertension, Ischemic cardiomyopathy, Macular hemorrhage, Prostate cancer ([place]), Sleep apnea, and Vision, loss, sudden (02/27/2011).   Surgical History:   Past Surgical History:  Procedure Laterality Date   BIOPSY  10/04/2021   Procedure: BIOPSY;  Surgeon: Lavena Bullion, DO;  Location: [facility];  Service: Gastroenterology;;   CORONARY ANGIOPLASTY WITH STENT PLACEMENT  2012   2 stents   ESOPHAGOGASTRODUODENOSCOPY (EGD) WITH PROPOFOL N/A 10/04/2021   Procedure: ESOPHAGOGASTRODUODENOSCOPY (EGD) WITH PROPOFOL;  Surgeon: Lavena Bullion, DO;  Location: [facility];  Service: Gastroenterology;  Laterality: N/A;   IR FLUORO GUIDE CV LINE RIGHT  09/29/2021   IR US GUIDE VASC ACCESS RIGHT  09/29/2021   RADIOACTIVE SEED IMPLANT N/A 05/08/2017   Procedure: RADIOACTIVE SEED IMPLANT/BRACHYTHERAPY IMPLANT;  Surgeon: Alexis Frock, MD;  Location: [facility];  Service: Urology;  Laterality: N/A;   REFRACTIVE SURGERY  2012   right and left eye/ per pt only left eye   SPACE OAR INSTILLATION N/A 05/08/2017   Procedure: SPACE OAR INSTILLATION;  Surgeon: Alexis Frock, MD;  Location: [facility];  Service: Urology;  Laterality: N/A;     Social History:   reports that he has never smoked. He has never used smokeless tobacco. He  reports current alcohol use of about 4.0 standard drinks of alcohol per week. He reports  that he does not use drugs.   Family History:  His family history includes Cancer in his father; Crohn's disease in his sister and another family member; Diabetes in his father and mother; Heart attack in his father and mother; Heart disease in his sister; Heart failure in his father and mother; Kidney disease in his brother; Prostate cancer in his father. He was adopted.   Allergies Allergies  Allergen Reactions   Clonidine Derivatives Other (See Comments)    Dizziness Drowsiness      Home Medications  Prior to Admission medications   Medication Sig Start Date End Date Taking? Authorizing Provider  acetaminophen (TYLENOL) 325 MG tablet Take 2 tablets (650 mg total) by mouth every 6 (six) hours as needed for mild pain (or Fever >/= 101). 10/05/21   Lajean Manes, MD  aspirin EC 81 MG tablet Take 1 tablet (81 mg total) by mouth daily. 06/08/20   Harvie Heck, MD  clopidogrel (PLAVIX) 75 MG tablet Take 1 tablet (75 mg total) by mouth daily. 06/14/21   Rehman, Areeg N, DO  Darbepoetin Alfa (ARANESP) 100 MCG/0.5ML SOSY injection Inject 0.5 mLs (100 mcg total) into the vein every Thursday with hemodialysis. 12/14/21   Romana Juniper, MD  dorzolamide-timolol (COSOPT) 22.3-6.8 MG/ML ophthalmic solution Place 1 drop into both eyes daily. 03/25/20   [provider]  Dulaglutide (TRULICITY) 3 MP/5.3IR SOPN Inject 3 mg as directed once a week. 11/08/21   Lajean Manes, MD  Empagliflozin-metFORMIN HCl ER (SYNJARDY XR) 09-998 MG TB24 Take 1 tablet by mouth daily. Take 1 tablet by mouth twice a day.    [provider]  fluticasone (FLONASE) 50 MCG/ACT nasal spray Place 2 sprays into both nostrils daily. 06/08/20   Harvie Heck, MD  midodrine (PROAMATINE) 5 MG tablet Take 1 tablet (5 mg total) by mouth every 8 (eight) hours. 12/12/21   Romana Juniper, MD  nitroGLYCERIN (NITROSTAT) 0.4 MG SL tablet Place 1 tablet (0.4 mg total) under the tongue every 5 (five) minutes as  needed. Patient taking differently: Place 0.4 mg under the tongue every 5 (five) minutes as needed for chest pain. 06/08/20   Harvie Heck, MD  pantoprazole (PROTONIX) 40 MG tablet Take 1 tablet (40 mg total) by mouth 2 (two) times daily. 10/05/21   Lajean Manes, MD  senna-docusate (SENOKOT-S) 8.6-50 MG tablet Take 1 tablet by mouth at bedtime as needed for mild constipation. 10/05/21   Lajean Manes, MD  Vitamin D, Ergocalciferol, (DRISDOL) 1.25 MG (50000 UNIT) CAPS capsule Take 50,000 Units by mouth every 7 (seven) days.    [provider]     Critical care time: 29 minutes     Noe Gens, MSN, APRN, NP-C, AGACNP-BC [facility] 01/01/2022, 4:26 PM   Please see [email or web address] for pager details.   From 7A-7P if no response, please call [phone] After hours, please call ELink [phone]

## 2022-01-01 NOTE — ED Notes (Signed)
Critical lactic acid of 4.0.  DO Clark aware.  No new orders at this time.

## 2022-01-01 NOTE — ED Notes (Signed)
Critical lactic acid of greater than 9.0.  MD Long aware.  No new orders at this time.

## 2022-01-01 NOTE — ED Provider Notes (Signed)
Emergency Department Provider Note   I have reviewed the triage vital signs and the nursing notes.   HISTORY  Chief Complaint Hematemesis   HPI Kevin Soto is a 61 y.o. male with complicated past medical history including ESRD on dialysis, diabetes, hypertension, hyperlipidemia, prior coffee-ground emesis presents with generalized weakness and report of black/gritty vomit yesterday by wife.  Patient was admitted in late July with hypotension and presumed sepsis.  He briefly required pressors but was ultimately discharged to [facility] rehab.  Prior to this, patient had admission in May with some coffee-ground emesis and anemia at that time but EGD showed no active bleeding and was thought to be secondary to uremia.  Wife tells me that yesterday he had another episode of black, gritty emesis but has not vomited again today.  Patient denies any abdominal pain although wife states he was complaining of this earlier in the morning.  He is not having discomfort in his chest or feeling short of breath.  Patient's wife states that he has been very weak over the past 36 hours and actually fell at home today.  She heard a crash and went in to find him on the floor which prompted him to come to the emergency department.  He has not noticed any black or bright red blood in his bowel movements.   Past Medical History:  Diagnosis Date   Anemia    CAD (coronary artery disease)    cath 8/12:  LM ok, mLAD occluded with trivial collats from right AM and dRCA, mDx (small) 80%, RI 25%, tiny branch of OM 90%, mAM 30-40%, RCA sub-totally occluded, then 80%, EF 40%, inf HK.  He underwent PCI with [name] with placement of a Promus DES to the mRCA x 2   Chronic kidney disease    stage 3 per chart   Diabetes mellitus    Dyslipidemia    ED (erectile dysfunction)    Gait instability 02/27/2011   History of alcohol abuse    History of stomach ulcers    History of stroke    evidence of CVA on MRI in past    Hyperlipidemia    Hypertension    Ischemic cardiomyopathy    echo 7/12: EF 40-45%, septal, apical, inf basal HK, mod LVH, mild MR, mild LAE.  EF 55% by echo 2015   Macular hemorrhage    Prostate cancer ([place])    Sleep apnea    no c-pap; wife denies   Vision, loss, sudden 02/27/2011    Review of Systems  Constitutional: No fever/chills. Positive weakness.  Eyes: No visual changes. ENT: No sore throat. Cardiovascular: Denies chest pain. Respiratory: Denies shortness of breath. Gastrointestinal: Positive abdominal pain. Positive nausea and vomiting.  No diarrhea.  No constipation. Genitourinary: Negative for dysuria. Musculoskeletal: Negative for back pain. Skin: Negative for rash. Neurological: Negative for headaches, focal weakness or numbness.   ____________________________________________   PHYSICAL EXAM:  VITAL SIGNS: Vitals:   01/03/22 0803 01/03/22 1224  BP: 128/68 125/78  Pulse: 70 77  Resp: 15 16  Temp: 98.3 F (36.8 C) 98 F (36.7 C)  SpO2:  100%    Constitutional: Alert. Appears sluggish and generally weak but awake and providing a brief history.  Eyes: Conjunctivae are normal.  Head: Atraumatic. Nose: No congestion/rhinnorhea. Mouth/Throat: Mucous membranes are moist.  Neck: No stridor.   Cardiovascular: Tachycardia. Good peripheral circulation. Grossly normal heart sounds.   Respiratory: Normal respiratory effort.  No retractions. Lungs CTAB. Gastrointestinal: Soft  and nontender. No distention.  Upon rectal exam the patient has brown stool without bright red blood or melena.  Musculoskeletal: No lower extremity tenderness nor edema. No gross deformities of extremities. Neurologic:  Normal speech and language. No gross focal neurologic deficits are appreciated.  Skin:  Skin is warm, dry and intact. No rash noted.  ____________________________________________   LABS (all labs ordered are listed, but only abnormal results are displayed)  Labs  Reviewed  COMPREHENSIVE METABOLIC PANEL - Abnormal; Notable for the following components:      Result Value   Chloride 91 (*)    CO2 20 (*)    Glucose, Bld 297 (*)    BUN 28 (*)    Creatinine, Ser 5.72 (*)    GFR, Estimated 10 (*)    Anion gap 25 (*)    All other components within normal limits  CBC WITH DIFFERENTIAL/PLATELET - Abnormal; Notable for the following components:   WBC 18.6 (*)    Hemoglobin 12.6 (*)    RDW 18.0 (*)    nRBC 0.5 (*)    Neutro Abs 14.7 (*)    Monocytes Absolute 1.4 (*)    Abs Immature Granulocytes 0.11 (*)    All other components within normal limits  LACTIC ACID, PLASMA - Abnormal; Notable for the following components:   Lactic Acid, Venous >9.0 (*)    All other components within normal limits  LACTIC ACID, PLASMA - Abnormal; Notable for the following components:   Lactic Acid, Venous 4.0 (*)    All other components within normal limits  URINALYSIS, ROUTINE W REFLEX MICROSCOPIC - Abnormal; Notable for the following components:   Color, Urine AMBER (*)    APPearance HAZY (*)    Glucose, UA 150 (*)    Bilirubin Urine SMALL (*)    Protein, ur 100 (*)    Leukocytes,Ua TRACE (*)    All other components within normal limits  LACTIC ACID, PLASMA - Abnormal; Notable for the following components:   Lactic Acid, Venous 2.4 (*)    All other components within normal limits  GLUCOSE, CAPILLARY - Abnormal; Notable for the following components:   Glucose-Capillary 113 (*)    All other components within normal limits  CBC - Abnormal; Notable for the following components:   WBC 13.5 (*)    RBC 3.89 (*)    Hemoglobin 11.1 (*)    HCT 34.7 (*)    RDW 17.4 (*)    All other components within normal limits  BASIC METABOLIC PANEL - Abnormal; Notable for the following components:   Potassium 3.4 (*)    Glucose, Bld 111 (*)    BUN 29 (*)    Creatinine, Ser 4.34 (*)    GFR, Estimated 15 (*)    All other components within normal limits  GLUCOSE, CAPILLARY -  Abnormal; Notable for the following components:   Glucose-Capillary 153 (*)    All other components within normal limits  GLUCOSE, CAPILLARY - Abnormal; Notable for the following components:   Glucose-Capillary 106 (*)    All other components within normal limits  GLUCOSE, CAPILLARY - Abnormal; Notable for the following components:   Glucose-Capillary 123 (*)    All other components within normal limits  GLUCOSE, CAPILLARY - Abnormal; Notable for the following components:   Glucose-Capillary 120 (*)    All other components within normal limits  GLUCOSE, CAPILLARY - Abnormal; Notable for the following components:   Glucose-Capillary 109 (*)    All other components within normal limits  IRON AND TIBC - Abnormal; Notable for the following components:   TIBC 246 (*)    All other components within normal limits  FERRITIN - Abnormal; Notable for the following components:   Ferritin 481 (*)    All other components within normal limits  CBC - Abnormal; Notable for the following components:   RBC 3.93 (*)    Hemoglobin 11.5 (*)    HCT 34.9 (*)    RDW 17.2 (*)    All other components within normal limits  BASIC METABOLIC PANEL - Abnormal; Notable for the following components:   Sodium 134 (*)    BUN 26 (*)    Creatinine, Ser 2.71 (*)    GFR, Estimated 26 (*)    All other components within normal limits  GLUCOSE, CAPILLARY - Abnormal; Notable for the following components:   Glucose-Capillary 142 (*)    All other components within normal limits  GLUCOSE, CAPILLARY - Abnormal; Notable for the following components:   Glucose-Capillary 63 (*)    All other components within normal limits  GLUCOSE, CAPILLARY - Abnormal; Notable for the following components:   Glucose-Capillary 104 (*)    All other components within normal limits  I-STAT CHEM 8, ED - Abnormal; Notable for the following components:   Chloride 95 (*)    BUN 31 (*)    Creatinine, Ser 5.90 (*)    Glucose, Bld 287 (*)     Calcium, Ion 1.05 (*)    All other components within normal limits  TROPONIN I (HIGH SENSITIVITY) - Abnormal; Notable for the following components:   Troponin I (High Sensitivity) 26 (*)    All other components within normal limits  TROPONIN I (HIGH SENSITIVITY) - Abnormal; Notable for the following components:   Troponin I (High Sensitivity) 34 (*)    All other components within normal limits  TROPONIN I (HIGH SENSITIVITY) - Abnormal; Notable for the following components:   Troponin I (High Sensitivity) 37 (*)    All other components within normal limits  CULTURE, BLOOD (ROUTINE X 2)  CULTURE, BLOOD (ROUTINE X 2)  SARS CORONAVIRUS 2 BY RT PCR  URINE CULTURE  MRSA NEXT GEN BY PCR, NASAL  LIPASE, BLOOD  PROTIME-INR  BRAIN NATRIURETIC PEPTIDE  HEMOGLOBIN A1C  MAGNESIUM  PHOSPHORUS  GLUCOSE, CAPILLARY  LACTIC ACID, PLASMA  GLUCOSE, CAPILLARY  GLUCOSE, CAPILLARY  GLUCOSE, CAPILLARY  GLUCOSE, CAPILLARY  POC OCCULT BLOOD, ED  TYPE AND SCREEN  ABO/RH   ____________________________________________  EKG   EKG Interpretation  Date/Time:  Monday January 01 2022 11:56:22 EDT Ventricular Rate:  115 PR Interval:  156 QRS Duration: 99 QT Interval:  349 QTC Calculation: 483 R Axis:   89 Text Interpretation: Sinus tachycardia Borderline right axis deviation Borderline repolarization abnormality Borderline prolonged QT interval Confirmed by Nanda Quinton [phone]) on 01/01/2022 12:00:46 PM       ____________________________________________   PROCEDURES  Procedure(s) performed:   .Critical Care  Performed by: Margette Fast, MD Authorized by: Margette Fast, MD   Critical care provider statement:    Critical care time (minutes):  35   Critical care time was exclusive of:  Separately billable procedures and treating other patients and teaching time   Critical care was necessary to treat or prevent imminent or life-threatening deterioration of the following conditions:  Shock    Critical care was time spent personally by me on the following activities:  Development of treatment plan with patient or surrogate, discussions with consultants, evaluation of patient's  response to treatment, examination of patient, ordering and review of laboratory studies, ordering and review of radiographic studies, ordering and performing treatments and interventions, pulse oximetry, re-evaluation of patient's condition, review of old charts, blood draw for specimens and obtaining history from patient or surrogate   I assumed direction of critical care for this patient from another provider in my specialty: no     Care discussed with: admitting provider      ____________________________________________   INITIAL IMPRESSION / [place] / ED COURSE  Pertinent labs & imaging results that were available during my care of the patient were reviewed by me and considered in my medical decision making (see chart for details).   This patient is Presenting for Evaluation of shock, which does require a range of treatment options, and is a complaint that involves a high risk of morbidity and mortality.  The Differential Diagnoses include acute upper GI bleed, septic shock, cardiogenic shock, AMS, etc.  Critical Interventions-    Medications  docusate sodium (COLACE) capsule 100 mg (has no administration in time range)  polyethylene glycol (MIRALAX / GLYCOLAX) packet 17 g (has no administration in time range)  heparin injection 5,000 Units (5,000 Units Subcutaneous Given 01/03/22 1440)  ondansetron (ZOFRAN) injection 4 mg (has no administration in time range)  insulin aspart (novoLOG) injection 0-9 Units ( Subcutaneous Not Given 01/03/22 1254)  aspirin EC tablet 81 mg (81 mg Oral Given 01/03/22 1036)  fluticasone (FLONASE) 50 MCG/ACT nasal spray 2 spray (2 sprays Each Nare Given 01/03/22 1036)  Oral care mouth rinse (has no administration in time range)  Chlorhexidine Gluconate Cloth 2 %  PADS 6 each (6 each Topical Given 01/03/22 1036)  pantoprazole (PROTONIX) EC tablet 40 mg (40 mg Oral Given 01/03/22 1036)  sodium chloride 0.9 % bolus 500 mL (0 mLs Intravenous Stopped 01/01/22 1228)  pantoprazole (PROTONIX) 80 mg /NS 100 mL IVPB (0 mg Intravenous Stopped 01/01/22 1325)  ondansetron (ZOFRAN) injection 4 mg (4 mg Intravenous Given 01/01/22 1212)  sodium chloride 0.9 % bolus 500 mL (0 mLs Intravenous Stopped 01/01/22 1347)  ceFEPIme (MAXIPIME) 2 g in sodium chloride 0.9 % 100 mL IVPB (0 g Intravenous Stopped 01/01/22 1426)  metroNIDAZOLE (FLAGYL) IVPB 500 mg (0 mg Intravenous Stopped 01/01/22 1549)  vancomycin (VANCOREADY) IVPB 2000 mg/400 mL (0 mg Intravenous Stopped 01/01/22 1728)  sodium chloride 0.9 % bolus 500 mL (0 mLs Intravenous Stopped 01/01/22 1500)  calcium gluconate 1 g/ 50 mL sodium chloride IVPB (0 mg Intravenous Stopped 01/01/22 1716)  potassium chloride SA (KLOR-CON M) CR tablet 20 mEq (20 mEq Oral Given 01/02/22 1021)    Reassessment after intervention: BP remains low.    I did obtain Additional Historical Information from wife.  I decided to review pertinent External Data, and in summary reviewed discharge summaries from his admissions in July and May.   Clinical Laboratory Tests Ordered, included lactic acid > 9. No clear UTI. COVID negative. Positive leukocytosis to 18.6  Radiologic Tests Ordered, included CXR. I independently interpreted the images and agree with radiology interpretation.   Cardiac Monitor Tracing which shows NSR.   Social Determinants of Health Risk patient does not drink EtOH.   Consult complete with ICU team. They agree with plan for admit.   Medical Decision Making: Summary:  Patient presents emergency room evaluation of generalized weakness and report of black emesis yesterday.  Has had suspected hematemesis in May with EGD showing no active bleed at that time and suspected to be  from uremia.  Patient arrives hypotensive.  He is awake  and maintaining his airway.  I will initiate IV fluid bolus and begin work-up including evaluation for GI bleeding but also considering sepsis.  He does not appear acutely volume overloaded to suggest acute cardiogenic shock.  He has no focal neurodeficits to strongly suspect primary neurologic process such as bleeding from fall or stroke.  Reevaluation with update and discussion with patient and family. Plan for continued resuscitation in the ICU setting given lactic acid. Will initially cover with abx.   Disposition: admit  ____________________________________________  FINAL CLINICAL IMPRESSION(S) / ED DIAGNOSES  Final diagnoses:  Hypovolemic shock ([place])    Note:  This document was prepared using Dragon voice recognition software and may include unintentional dictation errors.  Nanda Quinton, MD, [facility] Emergency Medicine    Amalee Olsen, [facility], MD 01/03/22 1536

## 2022-01-02 ENCOUNTER — Inpatient Hospital Stay (HOSPITAL_COMMUNITY): Payer: Medicare Other

## 2022-01-02 DIAGNOSIS — R0602 Shortness of breath: Secondary | ICD-10-CM

## 2022-01-02 DIAGNOSIS — E861 Hypovolemia: Secondary | ICD-10-CM | POA: Diagnosis not present

## 2022-01-02 DIAGNOSIS — I9589 Other hypotension: Secondary | ICD-10-CM | POA: Diagnosis not present

## 2022-01-02 LAB — BASIC METABOLIC PANEL
Anion gap: 13 (ref 5–15)
BUN: 29 mg/dL — ABNORMAL HIGH (ref 8–23)
CO2: 27 mmol/L (ref 22–32)
Calcium: 9.4 mg/dL (ref 8.9–10.3)
Chloride: 100 mmol/L (ref 98–111)
Creatinine, Ser: 4.34 mg/dL — ABNORMAL HIGH (ref 0.61–1.24)
GFR, Estimated: 15 mL/min — ABNORMAL LOW (ref >60)
Glucose, Bld: 111 mg/dL — ABNORMAL HIGH (ref 70–99)
Potassium: 3.4 mmol/L — ABNORMAL LOW (ref 3.5–5.1)
Sodium: 140 mmol/L (ref 135–145)

## 2022-01-02 LAB — GLUCOSE, CAPILLARY
Glucose-Capillary: 123 mg/dL — ABNORMAL HIGH (ref 70–99)
Glucose-Capillary: 120 mg/dL — ABNORMAL HIGH (ref 70–99)
Glucose-Capillary: 96 mg/dL (ref 70–99)
Glucose-Capillary: 109 mg/dL — ABNORMAL HIGH (ref 70–99)
Glucose-Capillary: 94 mg/dL (ref 70–99)
Glucose-Capillary: 142 mg/dL — ABNORMAL HIGH (ref 70–99)

## 2022-01-02 LAB — CBC
HCT: 34.7 % — ABNORMAL LOW (ref 39.0–52.0)
Hemoglobin: 11.1 g/dL — ABNORMAL LOW (ref 13.0–17.0)
MCH: 28.5 pg (ref 26.0–34.0)
MCHC: 32 g/dL (ref 30.0–36.0)
MCV: 89.2 fL (ref 80.0–100.0)
Platelets: 224 10*3/uL (ref 150–400)
RBC: 3.89 MIL/uL — ABNORMAL LOW (ref 4.22–5.81)
RDW: 17.4 % — ABNORMAL HIGH (ref 11.5–15.5)
WBC: 13.5 10*3/uL — ABNORMAL HIGH (ref 4.0–10.5)
nRBC: 0 % (ref 0.0–0.2)

## 2022-01-02 LAB — ECHOCARDIOGRAM COMPLETE
Area-P 1/2: 3.57 cm2
Calc EF: 67.8 %
S' Lateral: 2.4 cm
Single Plane A2C EF: 65.4 %
Single Plane A4C EF: 67.7 %
Weight: 2825.42 oz

## 2022-01-02 LAB — MAGNESIUM: Magnesium: 1.8 mg/dL (ref 1.7–2.4)

## 2022-01-02 LAB — PHOSPHORUS: Phosphorus: 4.3 mg/dL (ref 2.5–4.6)

## 2022-01-02 LAB — LACTIC ACID, PLASMA: Lactic Acid, Venous: 1.5 mmol/L (ref 0.5–1.9)

## 2022-01-02 MED ORDER — PANTOPRAZOLE SODIUM 40 MG IV SOLR: 40.0000 mg | Freq: Two times a day (BID) | INTRAVENOUS | Status: DC

## 2022-01-02 MED ORDER — POTASSIUM CHLORIDE CRYS ER 20 MEQ PO TBCR
20.0000 meq | EXTENDED_RELEASE_TABLET | Freq: Once | ORAL | Status: AC
  Administered 2022-01-02: 20 meq via ORAL
  Filled 2022-01-02: qty 1

## 2022-01-02 MED ORDER — SODIUM CHLORIDE 0.9 % IV SOLN
1.0000 g | INTRAVENOUS | Status: DC
  Filled 2022-01-02: qty 10

## 2022-01-02 NOTE — Progress Notes (Signed)
  Echocardiogram 2D Echocardiogram has been performed.  Kevin Soto 01/02/2022, 11:03 AM

## 2022-01-02 NOTE — Progress Notes (Signed)
PCCM follow up:  GI paged to discuss MRCP findings Sela Hilding PA recommended calling general surgery.  General surgery contacted. Saverio Danker PA reviewed imaging with attending. Nothing to do at this time as the imaging just appears to be sludge on MRCP. Recommended follow up if he ever develops biliary symptoms.   Redmond School., MSN, APRN, AGACNP-BC [facility]  01/02/2022 , 11:28 AM  Please see [email or web address] for pager details  If no response, please call [phone] After hours, please call Elink at [phone]

## 2022-01-02 NOTE — TOC Initial Note (Signed)
Transition of Care [facility]) - Initial/Assessment Note    Patient Details  Name: Kevin Soto MRN: [id number] Date of Birth: [birth year]  [facility] CM/SW Contact:    Pollie Friar, RN Phone Number: 01/02/2022, 2:18 PM  Clinical Narrative:                 Pt is from home with his sister. He was only home 2 days from [facility] when readmitted. He is ESRD with HD: T/Th/S [facility]. TOC following for d/c needs.   Expected Discharge Plan: Home/Self Care Barriers to Discharge: Continued Medical Work up   Patient Goals and CMS Choice        Expected Discharge Plan and Services Expected Discharge Plan: Home/Self Care In-house Referral: Clinical Social Work Discharge Planning Services: CM Consult   Living arrangements for the past 2 months: [place]                                      Prior Living Arrangements/Services Living arrangements for the past 2 months: Single Family Home                     Activities of Daily Living      Permission Sought/Granted                  Emotional Assessment              Admission diagnosis:  Hypovolemic shock ([place]) [R57.1] Hypotension [I95.9] Patient Active Problem List   Diagnosis Date Noted   Hypotension 01/01/2022   Sepsis ([place]) 12/03/2021   Septic shock ([place]) 12/03/2021   ESRD (end stage renal disease) on dialysis ([place]) 11/08/2021   Duodenitis    Gastroesophageal reflux disease with esophagitis without hemorrhage    Hiatal hernia    Acute renal failure ([place])    Uremia 09/27/2021   Type 2 diabetes mellitus with complication, without long-term current use of insulin ([place]) 07/21/2020   Dyslipidemia 07/21/2020   Memory change 05/07/2018   Chronic nasal congestion 11/12/2017   Malignant neoplasm of prostate ([place]) 03/11/2017   CAD (coronary artery disease)    Erectile dysfunction 05/08/2016   Advanced care planning/counseling discussion 01/31/2016   CKD (chronic kidney  disease) stage 4, GFR 15-29 ml/min ([place]) 08/18/2014   Normocytic anemia 06/24/2012   Preventative health care 06/24/2012   Unsteady gait when walking 02/27/2011   Hyperlipidemia due to type 2 diabetes mellitus ([place]) 02/16/2011   Atherosclerotic heart disease of native coronary artery with unspecified angina pectoris ([place]) 01/26/2011   Diabetes mellitus type 2 with neurological manifestations ([place]) 12/15/2010   Dilated cardiomyopathy ([place])    Hypertension associated with diabetes ([place])    CVA (cerebral vascular accident) ([place]) 11/28/2010   PCP:  Romana Juniper, MD Pharmacy:   [facility] ([place], [place] [facility] [address] [place] [address] Phone: [phone] Fax: [phone]  [facility] ([facility]) - [place], [place] [address] Phone: [phone] Fax: [place] [address] [place] [address] Phone: [phone] Fax: [phone]  [id number] [id number]: [phone]: [phone]  Social Determinants of Health (SDOH) Interventions    Readmission Risk Interventions     No data to display           

## 2022-01-02 NOTE — Progress Notes (Signed)
NAME:  Kevin Soto, MRN:  [id number], DOB:  24-Jan-1961, LOS: 1 ADMISSION DATE:  01/01/2022, CONSULTATION DATE:  8/14 REFERRING MD:  Dr. Laverta Baltimore, CHIEF COMPLAINT:  Abdominal Pain   History of Present Illness:  61 y/o M who presented to [facility] on 8/14 with reports of abdominal pain, weakness, fall and emesis.  The patient has had several admissions in the recent months, including May and July. His most recent admission in July was in the setting of hypotension with concern for sepsis but no source was identified. He required vasopressors, & midodrine for hypotension. Cortisol was normal.  He was treated with abx empirically.  Ultimately, he was discharged to rehab. He is T/Th/S HD at baseline.   Patient's sister reports he was discharged home from rehab and was at home for approximately two days.  He reportedly told his sister he felt bad at home on 8/13 and fell later at home.  He subsequently went to bed but was found with his pants part way off. He was found on the side of the bed with his legs hanging off, had to be repositioned in bed. They heard a "thud" this am and had fallen.  He required assistance up but was able to walk with his walker afterward. He has not been using his walker at home.    He returned to the ER on 8/14 with reports of weakness, stomach hurting in his lower abdomen and vomiting.  His sister reports his emesis "looked like blood".  She described it as green-brown.  BP at home after discharge from rehab 95/56, 103/63.  On 8/13 it was in the 70's.  She also reported high blood pressures in HD on Saturday.  She is not clear what meds he has been on as he has been at the rehab, but notes he was not on antihypertensives at hospital discharge but has been on them at home.  He has not eaten food since 8/13.  Reports no diarrhea, swelling, abdominal surgeries, non-smoker, no ETOH, thinks he has lost some weight but not clear on details.  In the ER, he was hypotensive with systolic in  85'U, lactic acid >9 and somewhat altered.  He was treated with IVF bolus with improvement in blood pressure and lactate. CXR negative for edema/infiltrate.  Initial labs - Na 136, K 3.8, Cl 95, glucose 287, BUN 31, cr 5.90, WBC 18.6, Hgb 12.6, and platelets 280. CT ABD/Pelvis pending.  PCCM called for ICU admission.   Pertinent  Medical History  CVA - bilateral cerebellar strokes, on plavix  DM  Prostate CA -s/p seed implants  ED  Anemia  ESRD - on HD HLD  HTN  CAD ICM Sleep Apnea - not on CPAP  Significant Hospital Events: Including procedures, antibiotic start and stop dates in addition to other pertinent events   8/14 Admit with abdominal pain  8/15 MRI: pending  Interim History / Subjective:  Tmax 98.9  Blood pressure 74/55-153/129  100 mL of urine output, +1 L this admission  Subjective: denies chest pain, denies SOB, denies abdominal pain  Objective   Blood pressure 104/62, pulse 80, temperature 98 F (36.7 C), temperature source Oral, resp. rate 11, weight 80.1 kg, SpO2 98 %.        Intake/Output Summary (Last 24 hours) at 01/02/2022 0636 Last data filed at 01/02/2022 0600 Gross per 24 hour  Intake 1156.9 ml  Output 100 ml  Net 1056.9 ml   Filed Weights   01/01/22 1634 01/02/22  0315  Weight: 81.6 kg 80.1 kg    Examination: General: In bed, NAD, appears comfortable HEENT: MM pink/moist, anicteric, atraumatic Neuro: RASS 0, PERRL 66m, GCS 15 CV: S1S2, NSR, no m/r/g appreciated PULM:  clear in the upper lobes, clear in the lower lobes, trachea midline, chest expansion symmetric GI: soft, bsx4 active, non-tender   Extremities: warm/dry, no pretibial edema, capillary refill less than 3 seconds  Skin: no rashes or lesions noted  Labs/imaging Blood glucose 111-28 Chest x-ray: No pneumo, effusion, infiltrate.  Right IJ dialysis catheter tip in right atrium.  Slight elevation of right hemidiaphragm Head CT: No acute intracranial abnormality noted, per  radiology remote bilateral cerebellar hemispheric infarcts CT abdomen and pelvis without contrast: Questionable mild gallbladder wall thickening and surrounding infiltrative changes, acute cholecystitis not excluded. Right upper quadrant ultrasound: Technically limited study, 2.3 cm rounded area in gallbladder fundus, sludge ball versus gallbladder mass, radiology recommended MRI, no sonographic evidence for acute solid cholecystitis WBC 18.6 >13.5 Hemoglobin 12.6 > 11.1 Potassium 3.4 Creatinine 5.9 > 4.34, BUN 31 > 29 Magnesium 1.8 Blood cultures pending, urine culture pending Lactate Greater than 9 > 4.0 > 2.4 Troponin 26>34 MRCP per radiology: 1. Thickened, irregular material within the gallbladder lumen, in keeping with findings of prior ultrasound. This is of uncertain significance, possibly reflecting gallbladder sludge, blood product, or alternately soft tissue. Contrast enhanced MR would be helpful to further evaluate. 2. Mild gallbladder wall thickening. No discrete gallstones. 3. No biliary ductal dilatation or evidence of choledocholithiasis. 4. Hepatic iron deposition.   Resolved Hospital Problem list     Assessment & Plan:  Hypotension-improving R/O Sepsis  R/O GIB, FOBT Negative Patient discharged home from rehab on 8/11.  Was discharged on midodrine with stopping of home antihypertensives.  Antihypertensives were resumed at home possibly including Coreg, hydrochlorothiazide, lisinopril, Flomax.  Possible concern for cholecystitis. Hemoglobin 12.6 > 11.1. -Continue vancomycin and cefepime, follow-up cultures -Lactate continues to trend down.  Greater than 9 > 4.0 > 2.4 -Goal MAP greater than 65.  -Monitor for signs of active bleeding -Will discuss MRCP with Dr. ELoanne Drilling ESRD Hypocalcemia  HD T/Th/S: continues to make urine, 1-2x per day normal, 1057mdocumented urine output.  -Nephrology consulted. No urgent need for HD at this time per neph. Holding on HD on  8/15, may wait until Thursday 8/17. -Trend BMP/urine output -Place electrolytes as indicated  Recent Gastritis  EGD with gastritis, H.pylori negative.  Coffee ground resolved with PPI.  Followed by [facility].  -Continue PPI. Stop GTT transition to IV BID -Monitor on subcu heparin -Continue clear liquid diet, advance as tolerated  HTN, HLD, ICM, CAD Hx CVA Troponin 26>34 -Follow-up rehab discharge medication recommendation -Continue holding home antihypertensives. -Continue ASA  DM II with Hyperglycemia A1c 5.3, Blood glucose 111-28 -Blood Glucose goal 140-180. -SSI sensitive scale   OSA  Not on CPAP -Monitor  Hypokalemia K 3.4 -Replete -Follow up on AM labs  Best Practice (right click and "Reselect all SmartList Selections" daily)  Diet/type: clear liquids DVT prophylaxis: prophylactic heparin  GI prophylaxis: PPI Lines: Dialysis Catheter Foley:  N/A Code Status:  full code Last date of multidisciplinary goals of care discussion: sister updated on plan of care 8/14 per Dr. ClCarlis Abbott  Critical care time: N/A  [employer] MSN, APRN, AGACNP-BC  Pulmonary & Critical Care  01/02/2022 , 6:37 AM  Please see [email or web address] for pager details  If no response, please call [phone] After hours, please call Elink  at [phone]

## 2022-01-02 NOTE — Progress Notes (Signed)
[facility] Progress Note    Assessment/ Plan:   ESRD:  -outpatient orders: [facility], TTS. 4hrs. F180. Flow rates: 400/600. EDW 79.3kg. 4K, 2.25Cal. Meds: mircera 127mg q2weeks (last dose 8/10). Venofer load '100mg'$  qtreatment (has 5 more doses left at the time of admit). -recently started on HD in May 2023 -BP has improved, remains euvolemic on exam. Cr actually slightly better as compared to yesterday but likely diluted from IV fluids. Will hold off on dialysis today (BP on the soft side). Tentatively planning for HD on Thursday provided no acute indications arise in the interim   Shock -etiology? Possibly hypovolemic in nature given exam and given that he has responded to fluids   Lactic acidosis -improved   Volume: EDW 79.3kg (recently lowered given that he was under his EDW). Will UF as tolerated w/ next HD. Monitor strict I/O if possible.   Anemia of Chronic Kidney Disease: Hemoglobin 12.6 on presentation, now 14.6, likely hemoconcentrated. Received ESA last on 8/10, not due for it until 8/24. Receiving Fe load as an outpatient, will recheck his Fe studies and resume Fe if Hgb falls below goal. Hgb currently at goal   Secondary Hyperparathyroidism/Hyperphosphatemia: not on binders, VDRA, or Vit D analogues. Monitor for now. Renal diet recommended. PO4 WNL   Additional recommendations: - Dose all meds for creatinine clearance < 10 ml/min  - Unless absolutely necessary, no MRIs with gadolinium.  - Implement save arm precautions.  Prefer needle sticks in the dorsum of the hands or wrists.  No blood pressure measurements in arm. - If blood transfusion is requested during hemodialysis sessions, please alert u[place]aprior to the session.  - If a hemodialysis catheter line culture is requested, please alert u[place]as only hemodialysis nurses are able to collect those specimens.    Recommendations were discussed with the primary team.  Subjective:   No acute events. BP's much better.  No complaints/acute events overnight   Objective:   BP 106/70 (BP Location: Left Arm)   Pulse 87   Temp 98 F (36.7 C) (Oral)   Resp 14   Wt 80.1 kg   SpO2 99%   BMI 23.95 kg/m   Intake/Output Summary (Last 24 hours) at 01/02/2022 0845 Last data filed at 01/02/2022 0800 Gross per 24 hour  Intake 1176.22 ml  Output 100 ml  Net 1076.22 ml   Weight change:   Physical Exam: Gen:NAD, laying flat in bed CVS:RRR Resp:CTA B/L AHQI:ONGEExt:no sig edema Neuro: awake, alert [facility]: MR ABDOMEN MRCP WO CONTRAST  Result Date: 01/02/2022 CLINICAL DATA:  Cholecystitis, abnormal gallbladder by prior CT and MR EXAM: MRI ABDOMEN WITHOUT CONTRAST  (INCLUDING MRCP) TECHNIQUE: Multiplanar multisequence MR imaging of the abdomen was performed. Heavily T2-weighted images of the biliary and pancreatic ducts were obtained, and three-dimensional MRCP images were rendered by post processing. COMPARISON:  CT abdomen pelvis, right upper quadrant ultrasound, 01/01/2022 FINDINGS: Lower chest: No acute abnormality. Hepatobiliary: Hepatic iron deposition. No obvious liver mass or other abnormality. No discrete gallstones. Thickened, irregular material within the gallbladder lumen, in keeping with findings of prior ultrasound (series 4, image 14). Mild gallbladder wall thickening. No biliary ductal dilatation or evidence of choledocholithiasis. Pancreas: Unremarkable. No pancreatic ductal dilatation or surrounding inflammatory changes. Spleen: Normal in size without significant abnormality. Adrenals/Urinary Tract: Adrenal glands are unremarkable. Simple, benign bilateral renal cortical cysts for which no further follow-up or characterization is required. Kidneys are otherwise normal, without renal calculi, obvious solid lesion, or  hydronephrosis. Stomach/Bowel: Stomach is within normal limits. No evidence of bowel wall thickening, distention, or inflammatory changes. Vascular/Lymphatic: No  significant vascular findings are present. No enlarged abdominal lymph nodes. Other: No abdominal wall hernia or abnormality. No ascites. Musculoskeletal: No acute or significant osseous findings. IMPRESSION: 1. Thickened, irregular material within the gallbladder lumen, in keeping with findings of prior ultrasound. This is of uncertain significance, possibly reflecting gallbladder sludge, blood product, or alternately soft tissue. Contrast enhanced MR would be helpful to further evaluate. 2. Mild gallbladder wall thickening. No discrete gallstones. 3. No biliary ductal dilatation or evidence of choledocholithiasis. 4. Hepatic iron deposition. Electronically Signed   By: Delanna Ahmadi M.D.   On: 01/02/2022 07:55   DG Chest Port 1 View  Result Date: 01/02/2022 CLINICAL DATA:  Sepsis. EXAM: PORTABLE CHEST 1 VIEW COMPARISON:  01/01/2022 FINDINGS: 0448 hours. Stable asymmetric elevation right hemidiaphragm. The lungs are clear without focal pneumonia, edema, pneumothorax or pleural effusion. The cardiopericardial silhouette is within normal limits for size. Right IJ dialysis catheter tip overlies the right atrium. Telemetry leads overlie the chest. IMPRESSION: No acute cardiopulmonary findings. Electronically Signed   By: Misty Stanley M.D.   On: 01/02/2022 06:41   US Abdomen Limited RUQ (LIVER/GB)  Result Date: 01/01/2022 CLINICAL DATA:  Abdominal pain.  Abnormal CT. EXAM: ULTRASOUND ABDOMEN LIMITED RIGHT UPPER QUADRANT COMPARISON:  None Available. FINDINGS: Gallbladder: The gallbladder is poorly visualized. There is no gallbladder wall thickening or pericholecystic fluid. Sonographic Percell Miller sign is negative per sonographer. Gallbladder sludge is present. There is a nonmobile rounded area with linear echogenic border in the fundus of the gallbladder measuring 2.2 x 2.0 by 2.3 cm. This is nonshadowing. Common bile duct: Diameter: 3 mm.  Poorly visualized. Liver: Incompletely visualized. No gross abnormality.  Echogenicity within normal limits. Portal vein is patent on color Doppler imaging with normal direction of blood flow towards the liver. Other: None. IMPRESSION: 1. Technically limited study. 2. 2.3 cm rounded area in the gallbladder fundus, indeterminate. Findings may be related to an adherent sludge ball or possibly gallbladder mass. This can be further evaluated with MRI. No sonographic evidence for acute cholecystitis. 3. The liver is not well visualized. Electronically Signed   By: Ronney Asters M.D.   On: 01/01/2022 17:06   CT ABDOMEN PELVIS WO CONTRAST  Result Date: 01/01/2022 CLINICAL DATA:  Acute nonlocalized abdominal pain, hypotension, hematemesis EXAM: CT ABDOMEN AND PELVIS WITHOUT CONTRAST TECHNIQUE: Multidetector CT imaging of the abdomen and pelvis was performed following the standard protocol without IV contrast. RADIATION DOSE REDUCTION: This exam was performed according to the departmental dose-optimization program which includes automated exposure control, adjustment of the mA and/or kV according to patient size and/or use of iterative reconstruction technique. COMPARISON:  09/27/2021 FINDINGS: Lower chest: Minimal dependent bibasilar atelectasis Hepatobiliary: Question mild gallbladder wall thickening and surrounding infiltrative changes. Liver unremarkable. Pancreas: Normal appearance Spleen: Normal appearance Adrenals/Urinary Tract: Adrenal glands normal appearance. Small mid LEFT renal cyst unchanged, 16 mm diameter. Kidneys, ureters, and bladder otherwise unremarkable. Stomach/Bowel: Normal appendix. Scattered stool throughout colon and rectum. No bowel wall thickening or evidence of obstruction. Stomach unremarkable. Vascular/Lymphatic: Atherosclerotic calcifications aorta, iliac arteries, coronary arteries. Aorta normal caliber. No adenopathy. Reproductive: Prior seed implant prostate gland. Other: BILATERAL hernias containing fat. No free air or free fluid. Umbilical hernia containing  fat. Musculoskeletal: Lipoma of LEFT iliopsoas tendon distally. Degenerative disc disease changes L4-L5 and L5-S1. Chronic superior endplate irregularity anteriorly L4. No acute osseous findings. IMPRESSION: Question mild gallbladder  wall thickening and surrounding infiltrative changes, acute cholecystitis not excluded; recommend correlation with LFTs and consider RIGHT upper quadrant abdominal ultrasound assessment. BILATERAL inguinal and umbilical hernias containing fat. Aortic Atherosclerosis (ICD10-I70.0). Electronically Signed   By: Lavonia Dana M.D.   On: 01/01/2022 15:48   CT Head Wo Contrast  Result Date: 01/01/2022 CLINICAL DATA:  Hypotension.  Head trauma. EXAM: CT HEAD WITHOUT CONTRAST TECHNIQUE: Contiguous axial images were obtained from the base of the skull through the vertex without intravenous contrast. RADIATION DOSE REDUCTION: This exam was performed according to the departmental dose-optimization program which includes automated exposure control, adjustment of the mA and/or kV according to patient size and/or use of iterative reconstruction technique. COMPARISON:  01/19/2021 FINDINGS: Brain: Significantly age advanced cerebral and cerebellar atrophy. Moderate low density in the periventricular white matter likely related to small vessel disease. Bilateral, left greater than right cerebellar hemispheric remote infarcts. No mass lesion, hemorrhage, hydrocephalus, acute infarct, intra-axial, or extra-axial fluid collection. Vascular: Intracranial atherosclerosis. No hyperdense vessel or unexpected calcification. Skull: No significant soft tissue swelling. Cerumen in the left external ear canal. No skull fracture. Sinuses/Orbits: Hyperattenuation in the left globe is similar and likely related to chronic hematoma. Clear paranasal sinuses and mastoid air cells. Other: None. IMPRESSION: 1. No acute intracranial abnormality. 2. Significantly age advanced cerebral and cerebellar atrophy with  periventricular white matter small vessel ischemic change. 3. Remote bilateral cerebellar hemispheric infarcts. Electronically Signed   By: Abigail Miyamoto M.D.   On: 01/01/2022 15:44   DG Chest Portable 1 View  Result Date: 01/01/2022 CLINICAL DATA:  Weakness, hematemesis. EXAM: PORTABLE CHEST 1 VIEW COMPARISON:  12/03/2021 and CT chest 01/19/2021. FINDINGS: Right IJ dialysis catheter tip is in the right atrium. Trachea is midline. Heart size stable. Lungs are somewhat low in volume with streaky atelectasis or scarring in the lung bases. No airspace consolidation or pleural fluid. IMPRESSION: No acute findings. Electronically Signed   By: Lorin Picket M.D.   On: 01/01/2022 12:53    Labs: BMET Recent Labs  Lab 01/01/22 1200 01/01/22 1226 01/02/22 0130  NA 136 136 140  K 3.8 3.8 3.4*  CL 91* 95* 100  CO2 20*  --  27  GLUCOSE 297* 287* 111*  BUN 28* 31* 29*  CREATININE 5.72* 5.90* 4.34*  CALCIUM 10.2  --  9.4  PHOS  --   --  4.3   CBC Recent Labs  Lab 01/01/22 1200 01/01/22 1226 01/02/22 0130  WBC 18.6*  --  13.5*  NEUTROABS 14.7*  --   --   HGB 12.6* 14.6 11.1*  HCT 39.6 43.0 34.7*  MCV 90.8  --  89.2  PLT 280  --  224    Medications:     aspirin EC  81 mg Oral Daily   Chlorhexidine Gluconate Cloth  6 each Topical Daily   fluticasone  2 spray Each Nare Daily   heparin  5,000 Units Subcutaneous Q8H   insulin aspart  0-9 Units Subcutaneous Q4H   [START ON 01/05/2022] pantoprazole  40 mg Intravenous Q12H   potassium chloride  20 mEq Oral Once      Gean Quint, MD [facility] 01/02/2022, 8:45 AM

## 2022-01-03 DIAGNOSIS — L8990 Pressure ulcer of unspecified site, unspecified stage: Secondary | ICD-10-CM | POA: Insufficient documentation

## 2022-01-03 LAB — URINE CULTURE: Culture: NO GROWTH

## 2022-01-03 LAB — BASIC METABOLIC PANEL
Anion gap: 11 (ref 5–15)
BUN: 26 mg/dL — ABNORMAL HIGH (ref 8–23)
CO2: 24 mmol/L (ref 22–32)
Calcium: 9.5 mg/dL (ref 8.9–10.3)
Chloride: 99 mmol/L (ref 98–111)
Creatinine, Ser: 2.71 mg/dL — ABNORMAL HIGH (ref 0.61–1.24)
GFR, Estimated: 26 mL/min — ABNORMAL LOW (ref >60)
Glucose, Bld: 89 mg/dL (ref 70–99)
Potassium: 3.9 mmol/L (ref 3.5–5.1)
Sodium: 134 mmol/L — ABNORMAL LOW (ref 135–145)

## 2022-01-03 LAB — GLUCOSE, CAPILLARY
Glucose-Capillary: 104 mg/dL — ABNORMAL HIGH (ref 70–99)
Glucose-Capillary: 175 mg/dL — ABNORMAL HIGH (ref 70–99)
Glucose-Capillary: 63 mg/dL — ABNORMAL LOW (ref 70–99)
Glucose-Capillary: 76 mg/dL (ref 70–99)
Glucose-Capillary: 90 mg/dL (ref 70–99)
Glucose-Capillary: 94 mg/dL (ref 70–99)
Glucose-Capillary: 97 mg/dL (ref 70–99)

## 2022-01-03 LAB — CBC
HCT: 34.9 % — ABNORMAL LOW (ref 39.0–52.0)
Hemoglobin: 11.5 g/dL — ABNORMAL LOW (ref 13.0–17.0)
MCH: 29.3 pg (ref 26.0–34.0)
MCHC: 33 g/dL (ref 30.0–36.0)
MCV: 88.8 fL (ref 80.0–100.0)
Platelets: 258 10*3/uL (ref 150–400)
RBC: 3.93 MIL/uL — ABNORMAL LOW (ref 4.22–5.81)
RDW: 17.2 % — ABNORMAL HIGH (ref 11.5–15.5)
WBC: 10 10*3/uL (ref 4.0–10.5)
nRBC: 0 % (ref 0.0–0.2)

## 2022-01-03 LAB — IRON AND TIBC
Iron: 56 ug/dL (ref 45–182)
Saturation Ratios: 23 % (ref 17.9–39.5)
TIBC: 246 ug/dL — ABNORMAL LOW (ref 250–450)
UIBC: 190 ug/dL

## 2022-01-03 LAB — FERRITIN: Ferritin: 481 ng/mL — ABNORMAL HIGH (ref 24–336)

## 2022-01-03 MED ORDER — PANTOPRAZOLE SODIUM 40 MG PO TBEC
40.0000 mg | DELAYED_RELEASE_TABLET | Freq: Two times a day (BID) | ORAL | Status: DC
  Administered 2022-01-03 – 2022-01-04 (×4): 40 mg via ORAL
  Filled 2022-01-03 (×5): qty 1

## 2022-01-03 NOTE — Evaluation (Signed)
Occupational Therapy Evaluation Patient Details Name: Kevin Soto MRN: [id number] DOB: 1960/07/25 Today's Date: 01/03/2022   History of Present Illness 61 yo male presents to [facility] on 8/14 with emesis, progressive weakness, falls. Recent admission 11/2021 for sepsis with d/Soto to SNF, got home on 8/11. PMH includes ESRD on HD TTS, CVA, DM, HFrEF 2/2 ICM, HTN, OSA, prostate cancer.   Clinical Impression   PTA, pt lived with his sister who assisted with IADLs and driving. Pt agreeable to therapy eval. Performing LB ADL and functional mobility with min guard A. Intermittent min A for RW management. Pt required min-mod cues for problem solving during sit<>stand, toilet transfers, and for RW management. Pt with slow processing, but RN reporting he may be near baseline. HR elevating up to 140 with functional mobility this session (see below); RN notified. Pt presents with decreased strength, balance, problem solving, and activity tolerance. Recommend discharge home with HHOT to optimize safety and independence with ADL.      Recommendations for follow up therapy are one component of a multi-disciplinary discharge planning process, led by the attending physician.  Recommendations may be updated based on patient status, additional functional criteria and insurance authorization.   Follow Up Recommendations  Home health OT    Assistance Recommended at Discharge Frequent or constant Supervision/Assistance  Patient can return home with the following A little help with walking and/or transfers;A little help with bathing/dressing/bathroom;Assistance with cooking/housework;Direct supervision/assist for medications management;Direct supervision/assist for financial management;Assist for transportation;Help with stairs or ramp for entrance    Functional Status Assessment  Patient has had a recent decline in their functional status and demonstrates the ability to make significant improvements in function in a  reasonable and predictable amount of time.  Equipment Recommendations  None recommended by OT (Pt has all recommended equipment)    Recommendations for Other Services       Precautions / Restrictions Precautions Precautions: Fall Precaution Comments: watch BP Restrictions Weight Bearing Restrictions: No      Mobility Bed Mobility Overal bed mobility: Needs Assistance Bed Mobility: Supine to Sit, Sit to Supine     Supine to sit: Min assist, HOB elevated Sit to supine: Min guard   General bed mobility comments: Assist to elevate trunk, hand held with therapist providing stability    Transfers Overall transfer level: Needs assistance Equipment used: Rolling walker (2 wheels) Transfers: Sit to/from Stand Sit to Stand: Min guard           General transfer comment: for safety, slow to rise with use of UEs from chair to power up. Cues for safety and technique      Balance Overall balance assessment: Needs assistance, History of Falls Sitting-balance support: No upper extremity supported, Feet supported Sitting balance-Leahy Scale: Fair Sitting balance - Comments: Doffing/donning socks with use of figure 4 position   Standing balance support: Bilateral upper extremity supported, During functional activity Standing balance-Leahy Scale: Poor Standing balance comment: reliant on external assist                           ADL either performed or assessed with clinical judgement   ADL Overall ADL's : Needs assistance/impaired Eating/Feeding: Set up;Sitting   Grooming: Wash/dry face;Sitting;Min guard   Upper Body Bathing: Sitting;Set up   Lower Body Bathing: Min guard;Sitting/lateral leans   Upper Body Dressing : Sitting;Set up   Lower Body Dressing: Min guard;Sit to/from stand Lower Body Dressing Details (indicate cue type and  reason): Can cross legs while seated to get to feet Toilet Transfer: Minimal assistance;Regular Toilet;Grab bars;Rolling walker  (2 wheels);Cueing for safety;Cueing for sequencing Toilet Transfer Details (indicate cue type and reason): Min A for rise from toilet Toileting- Clothing Manipulation and Hygiene: Sitting/lateral lean;Supervision/safety Toileting - Clothing Manipulation Details (indicate cue type and reason): Performing anterior pericare     Functional mobility during ADLs: Min guard;Rolling walker (2 wheels) General ADL Comments: Pt presented with decreased balance, strength, cognition, and activity tolerance.     Vision Patient Visual Report: No change from baseline Vision Assessment?: Vision impaired- to be further tested in functional context Additional Comments: Pt locating items on tray and muting television in room this session. Pt reporting vision has gotten worse with age, but nothing with acute onset. Cousin coming to visit, and pt unaware who he was from outside of door looking through window, but recognizing up close.     Perception     Praxis      Pertinent Vitals/Pain Pain Assessment Pain Assessment: No/denies pain Pain Intervention(s): Monitored during session     Hand Dominance Right   Extremity/Trunk Assessment Upper Extremity Assessment Upper Extremity Assessment: Generalized weakness;RUE deficits/detail;LUE deficits/detail RUE Deficits / Details: 4+/5 strength, coordination appropriate given age and dx LUE Deficits / Details: 4+/5 strength; coordination disorganized and slowed compared to R   Lower Extremity Assessment Lower Extremity Assessment: Defer to PT evaluation   Cervical / Trunk Assessment Cervical / Trunk Assessment: Kyphotic   Communication Communication Communication: No difficulties   Cognition Arousal/Alertness: Awake/alert Behavior During Therapy: Flat affect Overall Cognitive Status: No family/caregiver present to determine baseline cognitive functioning Area of Impairment: Following commands, Safety/judgement, Awareness, Problem solving                    Current Attention Level: Selective   Following Commands: Follows one step commands with increased time, Follows one step commands consistently, Follows multi-step commands inconsistently Safety/Judgement: Decreased awareness of safety, Decreased awareness of deficits Awareness: Emergent Problem Solving: Slow processing, Requires verbal cues, Difficulty sequencing General Comments: RN reports pt is likely near baseline for cognitive processing. Verbal cues for sequencing of transfers and problem solving with RW management into restroom.     General Comments  HR elevating to 140 during ambulation back to bed from restroom. BP 158/72 after ambulation. ~2 min rest break and 143/89. RN notified of HR and BP. HR 88 on departure. First and second cousin entering during session.    Exercises     Shoulder Instructions      Home Living Family/patient expects to be discharged to:: Private residence Living Arrangements: Other relatives (sister) Available Help at Discharge: Family;Available 24 hours/day Type of Home: House Home Access: Stairs to enter [employer] of Steps: 4 Entrance Stairs-Rails: Right Home Layout: One level     Bathroom Shower/Tub: Tub/shower unit;Walk-in shower   Bathroom Toilet: Standard     Home Equipment: Conservation officer, nature (2 wheels);Cane - single point;Shower seat;BSC/3in1          Prior Functioning/Environment Prior Level of Function : Needs assist             Mobility Comments: Usually no AD ADLs Comments: sister cooks and cleans, hasn't driven in 1 year.        OT Problem List: Decreased strength;Decreased range of motion;Decreased activity tolerance;Impaired balance (sitting and/or standing);Decreased knowledge of use of DME or AE;Decreased cognition;Impaired vision/perception;Decreased safety awareness;Decreased coordination      OT Treatment/Interventions: Self-care/ADL training;Therapeutic exercise;Energy conservation;DME  and/or AE instruction;Therapeutic activities;Patient/family education;Cognitive remediation/compensation;Balance training    OT Goals(Current goals can be found in the care plan section) Acute Rehab OT Goals Patient Stated Goal: None stated OT Goal Formulation: With patient Time For Goal Achievement: 01/17/22 Potential to Achieve Goals: Good  OT Frequency: Min 2X/week    Co-evaluation              AM-PAC OT "6 Clicks" Daily Activity     Outcome Measure Help from another person eating meals?: None Help from another person taking care of personal grooming?: A Little Help from another person toileting, which includes using toliet, bedpan, or urinal?: A Little Help from another person bathing (including washing, rinsing, drying)?: A Little Help from another person to put on and taking off regular upper body clothing?: A Little Help from another person to put on and taking off regular lower body clothing?: A Little 6 Click Score: 19   End of Session Equipment Utilized During Treatment: Gait belt;Rolling walker (2 wheels) Nurse Communication: Mobility status;Other (comment) (vitals)  Activity Tolerance: Patient tolerated treatment well Patient left: in bed;with call bell/phone within reach;with bed alarm set  OT Visit Diagnosis: Unsteadiness on feet (R26.81);Other abnormalities of gait and mobility (R26.89);Muscle weakness (generalized) (M62.81);History of falling (Z91.81);Other symptoms and signs involving cognitive function                Time: 1441-1505 OT Time Calculation (min): 24 min Charges:  OT General Charges $OT Visit: 1 Visit OT Evaluation $OT Eval Low Complexity: 1 Low OT Treatments $Self Care/Home Management : 8-22 mins  Kevin Soto, OTR/L Kevin Soto [facility] Acute Rehabilitation Office: [phone]   Kevin Soto 01/03/2022, 3:47 PM

## 2022-01-03 NOTE — Evaluation (Signed)
Physical Therapy Evaluation Patient Details Name: Kevin Soto MRN: [id number] DOB: 02/04/1961 Today's Date: 01/03/2022  History of Present Illness  61 yo male presents to [facility] on 8/14 with emesis, progressive weakness, falls. Recent admission 11/2021 for sepsis with d/c to SNF, got home on 8/11. PMH includes ESRD on HD TTS, CVA, DM, HFrEF 2/2 ICM, HTN, OSA, prostate cancer.  Clinical Impression   Pt presents with generalized weakness, impaired LE coordination R>L, impaired standing balance with history of recent falls, and decreased activity tolerance. Pt to benefit from acute PT to address deficits. Pt ambulated hallway distance with use of RW, overall requiring close guard for safety only. Pt with preference to d/c home in care of sister as needed, PT recommending HHPT. PT to progress mobility as tolerated, and will continue to follow acutely.      BP 129/86 post-ambulation, no dizziness during mobility    Recommendations for follow up therapy are one component of a multi-disciplinary discharge planning process, led by the attending physician.  Recommendations may be updated based on patient status, additional functional criteria and insurance authorization.  Follow Up Recommendations Home health PT Can patient physically be transported by private vehicle: Yes    Assistance Recommended at Discharge Intermittent Supervision/Assistance  Patient can return home with the following  A little help with walking and/or transfers;A little help with bathing/dressing/bathroom;Assistance with cooking/housework;Assist for transportation;Help with stairs or ramp for entrance    Equipment Recommendations None recommended by PT  Recommendations for Other Services       Functional Status Assessment Patient has had a recent decline in their functional status and demonstrates the ability to make significant improvements in function in a reasonable and predictable amount of time.     Precautions /  Restrictions Precautions Precautions: Fall Restrictions Weight Bearing Restrictions: No      Mobility  Bed Mobility Overal bed mobility: Needs Assistance             General bed mobility comments: up in chair    Transfers Overall transfer level: Needs assistance Equipment used: Rolling walker (2 wheels) Transfers: Sit to/from Stand Sit to Stand: Min guard           General transfer comment: for safety, slow to rise with use of UEs from chair to power up    Ambulation/Gait Ambulation/Gait assistance: Min guard Gait Distance (Feet): 100 Feet Assistive device: Rolling walker (2 wheels) Gait Pattern/deviations: Step-through pattern, Decreased stride length, Drifts right/left, Decreased weight shift to left, Wide base of support Gait velocity: decr     General Gait Details: min incoordination LEs R>L, cues for navigating room and hallway.  Stairs            Wheelchair Mobility    Modified Rankin (Stroke Patients Only)       Balance Overall balance assessment: Needs assistance, History of Falls Sitting-balance support: No upper extremity supported, Feet supported Sitting balance-Leahy Scale: Fair     Standing balance support: Bilateral upper extremity supported, During functional activity Standing balance-Leahy Scale: Poor Standing balance comment: reliant on external assist                             Pertinent Vitals/Pain Pain Assessment Pain Assessment: No/denies pain    Home Living Family/patient expects to be discharged to:: Private residence Living Arrangements: Other relatives (sister) Available Help at Discharge: Family Type of Home: House Home Access: Stairs to enter Entrance Stairs-Rails: Right Entrance Stairs-Number  of Steps: 4   Home Layout: One level Home Equipment: Conservation officer, nature (2 wheels);Cane - single point;Shower seat;BSC/3in1      Prior Function Prior Level of Function : Needs assist              Mobility Comments: using walker intermittently ADLs Comments: pt independent ADL's, sister does cooking/cleaning/laundry, pt does not drive     Hand Dominance   Dominant Hand: Right    Extremity/Trunk Assessment   Upper Extremity Assessment Upper Extremity Assessment: Defer to OT evaluation    Lower Extremity Assessment Lower Extremity Assessment: Generalized weakness (4/5 throughout except 3+/5 hip abd/add bilat. Impaired gross motor coordination bilat worse RLE functionally)    Cervical / Trunk Assessment Cervical / Trunk Assessment: Kyphotic  Communication   Communication: No difficulties  Cognition Arousal/Alertness: Awake/alert Behavior During Therapy: Flat affect Overall Cognitive Status: No family/caregiver present to determine baseline cognitive functioning Area of Impairment: Following commands, Problem solving                       Following Commands: Follows one step commands with increased time     Problem Solving: Slow processing, Requires verbal cues          General Comments      Exercises     Assessment/Plan    PT Assessment Patient needs continued PT services  PT Problem List Decreased activity tolerance;Decreased balance;Decreased mobility;Decreased knowledge of use of DME;Decreased safety awareness;Decreased knowledge of precautions;Decreased strength;Decreased coordination       PT Treatment Interventions DME instruction;Gait training;Functional mobility training;Therapeutic activities;Therapeutic exercise;Balance training;Patient/family education;Stair training    PT Goals (Current goals can be found in the Care Plan section)  Acute Rehab PT Goals Patient Stated Goal: to go home PT Goal Formulation: With patient Time For Goal Achievement: 01/17/22 Potential to Achieve Goals: Good    Frequency Min 3X/week     Co-evaluation               AM-PAC PT "6 Clicks" Mobility  Outcome Measure Help needed turning from your  back to your side while in a flat bed without using bedrails?: A Little Help needed moving from lying on your back to sitting on the side of a flat bed without using bedrails?: A Little Help needed moving to and from a bed to a chair (including a wheelchair)?: A Little Help needed standing up from a chair using your arms (e.g., wheelchair or bedside chair)?: A Little Help needed to walk in hospital room?: A Little Help needed climbing 3-5 steps with a railing? : A Little 6 Click Score: 18    End of Session   Activity Tolerance: Patient limited by fatigue Patient left: with call bell/phone within reach;in chair;with chair alarm set Nurse Communication: Mobility status PT Visit Diagnosis: Muscle weakness (generalized) (M62.81);Other abnormalities of gait and mobility (R26.89);Other symptoms and signs involving the nervous system (R29.898)    Time: 8182-9937 PT Time Calculation (min) (ACUTE ONLY): 16 min   Charges:   PT Evaluation $PT Eval Low Complexity: 1 Low         Audrick Lamoureaux S, PT DPT Acute Rehabilitation Services Pager [phone]  Office [phone]   Roxine Caddy E Ruffin Pyo 01/03/2022, 12:19 PM

## 2022-01-03 NOTE — Progress Notes (Addendum)
HD#2 Subjective:  Patient Summary: Kevin Soto is a 61 y.o. with a pertinent PMH of ESRD, CVA, HTN, diabetes, prostate cancer, OSA, who presented with weakness and abdominal pain and admitted for hypotension likely from medications responsive to fluid resuscitation.    Patient was recently admitted on 7/16 for septic shock with unclear etiology.  Patient was in the ICU requiring vasopressor.  Adrenal insufficiency was ruled out.  His home blood pressure medications were discontinued and patient was started with midodrine.  Per story, patient had resumed his antihypertensives after discharge.  Lactic acid on admission was 9 and trending down to 2.4 after fluids.  CT abdomen/pelvis was unremarkable except for questionable gallbladder thickening.  RUQ ultrasound and MRCP was obtained which showed a thickened and irregular material within the gallbladder.  GI and surgery was consulted and did not recommend any intervention.  Antibiotics was started but discontinued without any source of obvious infection.  Blood culture negative to date.  Nephrology is on board and HD has been on hold due to hypotension.  Tentative plan to restart HD today.  No overnight event  Patient is seen at bedside.  He appears comfortable and in no acute distress patient reports doing well, denied dizziness or lightheadedness or weakness.  He states that he was feeling well throughout this admission.  Patient denies abdominal pain, nausea, vomiting, hematemesis, hematochezia or melena.  Patient is ready for solid food.  Patient admits to resuming his antihypertensive after discharge by accident.  Objective:  Vital signs in last 24 hours: Vitals:   01/02/22 1958 01/03/22 0002 01/03/22 0407 01/03/22 0439  BP: 132/62 103/64 117/83   Pulse: 90 72 79   Resp: '19 11 17   '$ Temp: 98.2 F (36.8 C) 98.3 F (36.8 C) 97.8 F (36.6 C)   TempSrc: Oral Oral Oral   SpO2: 98% 100% 99%   Weight:    82 kg   Supplemental O2:  Room Air SpO2: 99 %   Physical Exam:  Physical Exam Constitutional:      General: He is not in acute distress.    Appearance: He is not ill-appearing.  HENT:     Head: Normocephalic.  Eyes:     General:        Right eye: No discharge.        Left eye: No discharge.     Conjunctiva/sclera: Conjunctivae normal.  Cardiovascular:     Rate and Rhythm: Normal rate and regular rhythm.  Pulmonary:     Effort: Pulmonary effort is normal. No respiratory distress.     Breath sounds: Normal breath sounds. No wheezing.  Abdominal:     General: Bowel sounds are normal. There is no distension.     Palpations: Abdomen is soft.     Tenderness: There is no abdominal tenderness. There is no guarding.  Musculoskeletal:        General: No swelling.  Skin:    General: Skin is warm.  Neurological:     Mental Status: He is alert and oriented to person, place, and time.  Psychiatric:        Mood and Affect: Mood normal.     Filed Weights   01/01/22 1634 01/02/22 0315 01/03/22 0439  Weight: 81.6 kg 80.1 kg 82 kg     Intake/Output Summary (Last 24 hours) at 01/03/2022 0722 Last data filed at 01/02/2022 1400 Gross per 24 hour  Intake 55.45 ml  Output 200 ml  Net -144.55 ml   Net  IO Since Admission: 922.34 mL [01/03/22 0722]  Pertinent Labs:    Latest Ref Rng & Units 01/03/2022    4:48 AM 01/02/2022    1:30 AM 01/01/2022   12:26 PM  CBC  WBC 4.0 - 10.5 K/uL 10.0  13.5    Hemoglobin 13.0 - 17.0 g/dL 11.5  11.1  14.6   Hematocrit 39.0 - 52.0 % 34.9  34.7  43.0   Platelets 150 - 400 K/uL 258  224         Latest Ref Rng & Units 01/03/2022    4:48 AM 01/02/2022    1:30 AM 01/01/2022   12:26 PM  CMP  Glucose 70 - 99 mg/dL 89  111  287   BUN 8 - 23 mg/dL '26  29  31   '$ Creatinine 0.61 - 1.24 mg/dL 2.71  4.34  5.90   Sodium 135 - 145 mmol/L 134  140  136   Potassium 3.5 - 5.1 mmol/L 3.9  3.4  3.8   Chloride 98 - 111 mmol/L 99  100  95   CO2 22 - 32 mmol/L 24  27    Calcium 8.9 - 10.3  mg/dL 9.5  9.4      Imaging: ECHOCARDIOGRAM COMPLETE  Result Date: 01/02/2022    ECHOCARDIOGRAM REPORT   Patient Name:   Kevin Soto Date of Exam: 01/02/2022 Medical Rec #:  [id number]         Height:       72.0 in Accession #:    [id number]        Weight:       176.6 lb Date of Birth:  24-Mar-1961          BSA:          2.021 m Patient Age:    62 years          BP:           106/70 mmHg Patient Gender: M                 HR:           82 bpm. Exam Location:  Inpatient Procedure: 2D Echo, Cardiac Doppler and Color Doppler Indications:    R06.02 SOB  History:        Patient has prior history of Echocardiogram examinations, most                 recent 12/05/2021. Cardiomyopathy, Stroke,                 Signs/Symptoms:Hypotension; Risk Factors:Hypertension and                 Diabetes. ESRD.  Sonographer:    Roseanna Rainbow RDCS Referring Phys: Donita Brooks  Sonographer Comments: Technically difficult study due to poor echo windows, suboptimal parasternal window, suboptimal apical window and suboptimal subcostal window. Patient had hiccups throughout exam. IMPRESSIONS  1. Left ventricular ejection fraction, by estimation, is 65 to 70%. The left ventricle has normal function. The left ventricle has no regional wall motion abnormalities. Left ventricular diastolic parameters are consistent with Grade I diastolic dysfunction (impaired relaxation).  2. Right ventricular systolic function is normal. The right ventricular size is normal.  3. The mitral valve is grossly normal. No evidence of mitral valve regurgitation. No evidence of mitral stenosis.  4. The aortic valve is tricuspid. Aortic valve regurgitation is not visualized. Aortic valve sclerosis is present, with no evidence of aortic valve stenosis.  5.  The inferior vena cava is normal in size with greater than 50% respiratory variability, suggesting right atrial pressure of 3 mmHg. Comparison(s): No significant change from prior study. FINDINGS  Left Ventricle:  Left ventricular ejection fraction, by estimation, is 65 to 70%. The left ventricle has normal function. The left ventricle has no regional wall motion abnormalities. The left ventricular internal cavity size was normal in size. There is  no left ventricular hypertrophy. Left ventricular diastolic parameters are consistent with Grade I diastolic dysfunction (impaired relaxation). Right Ventricle: The right ventricular size is normal. No increase in right ventricular wall thickness. Right ventricular systolic function is normal. Left Atrium: Left atrial size was normal in size. Right Atrium: Right atrial size was normal in size. Pericardium: There is no evidence of pericardial effusion. Mitral Valve: The mitral valve is grossly normal. No evidence of mitral valve regurgitation. No evidence of mitral valve stenosis. Tricuspid Valve: The tricuspid valve is grossly normal. Tricuspid valve regurgitation is trivial. No evidence of tricuspid stenosis. Aortic Valve: The aortic valve is tricuspid. Aortic valve regurgitation is not visualized. Aortic valve sclerosis is present, with no evidence of aortic valve stenosis. Pulmonic Valve: The pulmonic valve was grossly normal. Pulmonic valve regurgitation is not visualized. No evidence of pulmonic stenosis. Aorta: The aortic root and ascending aorta are structurally normal, with no evidence of dilitation. Venous: The inferior vena cava is normal in size with greater than 50% respiratory variability, suggesting right atrial pressure of 3 mmHg. IAS/Shunts: The atrial septum is grossly normal. Additional Comments: A venous catheter is visualized in the right atrium.  LEFT VENTRICLE PLAX 2D LVIDd:         3.30 cm     Diastology LVIDs:         2.40 cm     LV e' medial:    5.03 cm/s LV PW:         1.70 cm     LV E/e' medial:  10.7 LV IVS:        1.20 cm     LV e' lateral:   4.37 cm/s LVOT diam:     2.20 cm     LV E/e' lateral: 12.3 LV SV:         53 LV SV Index:   26 LVOT Area:      3.80 cm  LV Volumes (MOD) LV vol d, MOD A2C: 27.3 ml LV vol d, MOD A4C: 46.4 ml LV vol s, MOD A2C: 9.5 ml LV vol s, MOD A4C: 15.0 ml LV SV MOD A2C:     17.8 ml LV SV MOD A4C:     46.4 ml LV SV MOD BP:      25.9 ml RIGHT VENTRICLE         IVC TAPSE (M-mode): 1.5 cm  IVC diam: 2.00 cm LEFT ATRIUM             Index       RIGHT ATRIUM           Index LA diam:        2.70 cm 1.34 cm/m  RA Area:     12.80 cm LA Vol (A2C):   13.8 ml 6.83 ml/m  RA Volume:   30.60 ml  15.14 ml/m LA Vol (A4C):   12.7 ml 6.28 ml/m LA Biplane Vol: 14.4 ml 7.13 ml/m  AORTIC VALVE LVOT Vmax:   87.33 cm/s LVOT Vmean:  56.233 cm/s LVOT VTI:    0.140 m  AORTA Ao Root  diam: 3.20 cm Ao Asc diam:  3.20 cm MITRAL VALVE MV Area (PHT): 3.57 cm    SHUNTS MV Decel Time: 213 msec    Systemic VTI:  0.14 m MV E velocity: 53.80 cm/s  Systemic Diam: 2.20 cm MV A velocity: 70.20 cm/s MV E/A ratio:  0.77 Eleonore Chiquito MD Electronically signed by Eleonore Chiquito MD Signature Date/Time: 01/02/2022/1:56:15 PM    Final     Assessment/Plan:   Principal Problem:   Hypotension Active Problems:   Hypertension associated with diabetes ([place])   Diabetes mellitus type 2 with neurological manifestations ([place])   Normocytic anemia   Duodenitis   ESRD (end stage renal disease) on dialysis [facility])  Patient Summary: Kevin Soto is a 61 y.o. with a pertinent PMH of ESRD, CVA, HTN, diabetes, prostate cancer, OSA on CPAP, who presented with weakness and abdominal pain and admitted for medication induced hypotension responsive to fluid resuscitation.    Medication induced hypotension in the setting of ESRD Sepsis ruled out. Lactic acid 9 > 4.0 > 2.4 > 1.5 No obvious source of infection or bleeding.  No evidence of cardiogenic shock (normal TTE). GI and general surgery recommended no intervention with gallbladder sludge.  Patient's blood pressure improved with fluid and did not require vasopressor. -Continue holding home antihypertensives (Coreg, HCTZ,  lisinopril, Flomax) -Patient does not require midodrine at this time. -Continue holding antibiotics.  No fever since admission.  Follow-up blood culture.  Gallbladder sludge on MRCP No biliary ductal dilation or choledocholithiasis.  Patient has no symptoms.  No intervention per general surgery. -Monitor for signs of biliary symptoms  ESRD HD T/Th/S: continues to make urine, 200 cc yesterday Nephrology is on board.  HD has been on hold due to hypotension. Spoke with Dr. Candiss Norse who decided to hold off on HD given his kidney function remains stable without HD since last Saturday. They might resume HD tomorrow if indicated.  -Trend BMP/urine output   History of GI bleed FOBT Negative EGD in May showed esophagitis and erythematous duodenum.  No clear evidence of GI bleed during this admission.  Patient denies any symptoms of GI bleed and tolerates p.o. intake well.  His hemoglobin has been stable and improved since July.  No evidence of iron deficiency. -Transition Protonix to p.o. -Monitor on subcu heparin -Advance diet to renal   HTN, HLD, ICM, CAD Hx CVA -Continue aspirin. -Can resume Plavix at discharge -Last LDL (07/14/21) 69.  Patient is not on a statin -Holding beta-blocker and ACE inhibitor  Type 2 diabetes A1c 5.3 -SSI sensitive scale  -Will stop Synjardy due to ESRD. -Will need to discuss with patient about Trulicity at discharge.  His BMI is 24.  Diet: Renal IVF: None,None VTE: Heparin Code: Full PT/OT recs: Pending. TOC recs: Pending  Dispo: Anticipated discharge to Home or SNF in 2 days pending HD tolerability and PT/OT recommendation.   Gaylan Gerold, DO 01/03/2022, 7:22 AM Pager: [phone]  Please contact the on call pager after 5 pm and on weekends at [phone].

## 2022-01-03 NOTE — Inpatient Diabetes Management (Signed)
Inpatient Diabetes Program Recommendations  AACE/ADA: New Consensus Statement on Inpatient Glycemic Control (2015)  Target Ranges:  Prepandial:   less than 140 mg/dL      Peak postprandial:   less than 180 mg/dL (1-2 hours)      Critically ill patients:  140 - 180 mg/dL   Lab Results  Component Value Date   GLUCAP 97 01/03/2022   HGBA1C 5.3 01/01/2022    Review of Glycemic Control  Latest Reference Range & Units 01/02/22 07:10 01/02/22 11:28 01/02/22 15:44 01/02/22 20:39 01/03/22 00:07 01/03/22 00:38 01/03/22 04:05 01/03/22 08:02  Glucose-Capillary 70 - 99 mg/dL 96 109 (H) 94 142 (H) 63 (L) 76 90 97   Diabetes history: DM 2 Outpatient Diabetes medications: Trulicity 3 mg Weekly, Synjardy 09-998 mg bid Current orders for Inpatient glycemic control:  Novolog 0-9 units Q4 hours  Inpatient Diabetes Program Recommendations:     Renal function elevated. Pt had hypoglycemia yesterday for 1 unit of Novolog or a glucose in the 140's. Pt has diet ordered  -  Consider decreasing Novolog Correction to Novolog "very sensitive" scale 0-6 units tid + hs scale. Scale starts at 150 mg/dl.  Thanks, Tama Headings RN, MSN, BC-ADM Inpatient Diabetes Coordinator Team Pager [phone] (8a-5p)

## 2022-01-03 NOTE — Progress Notes (Signed)
[facility] Progress Note    Assessment/ Plan:   ESRD:  -outpatient orders: [facility], TTS. 4hrs. F180. Flow rates: 400/600. EDW 79.3kg. 4K, 2.25Cal. Meds: mircera 110mg q2weeks (last dose 8/10). Venofer load '100mg'$  qtreatment (has 5 more doses left at the time of admit). -recently started on HD in May 2023. S/p Renal biopsy in 09/2021: ATI w/ occasional myoglobin casts, diabetic neuropathy (class IIb), moderate IF/TA (however small cortical sample) -Cr continues to improve without dialysis. Although too early to say, he may very well be in renal recovery at this junction. I am going to tentatively hold off on dialysis today and tomorrow and if his Cr continues to be stable or improve, [facility]. -no indications for RRT today--euvolemic on exam, no uremic symptoms   Shock -etiology? Possibly hypovolemic in nature given exam and given that he has responded to fluids. BP now stable, out of ICU now.   Lactic acidosis -improved   Volume: EDW 79.3kg (recently lowered given that he was under his EDW).  Monitor strict I/O if possible.   Anemia of Chronic Kidney Disease: Hemoglobin 12.6 on presentation, now 14.6, likely hemoconcentrated. Received ESA last on 8/10, not due for it until 8/24. Receiving Fe load as an outpatient-will hold for now. Hgb remains at goal 11.5 today   Secondary Hyperparathyroidism/Hyperphosphatemia: not on binders, VDRA, or Vit D analogues. Monitor for now. Renal diet recommended. PO4 WNL   Recommendations were discussed with the primary team.  Subjective:   No acute events. Out of ICU, BP's stable. Denies chest pain, shortness of breath, swelling, diminished urinary frequency (typically urinates about 2-3 times a day, adequate amounts), nausea/vomiting, dysgeusia, loss of appetite, intractable hiccups/pruritus, brain fog. Discussed with Sister ASeth Bakeover the phone as well.   Objective:   BP 128/68 (BP Location: Left Arm)    Pulse 70   Temp 98.3 F (36.8 C) (Tympanic)   Resp 15   Wt 82 kg   SpO2 99%   BMI 24.52 kg/m   Intake/Output Summary (Last 24 hours) at 01/03/2022 [id number] data filed at 01/02/2022 1400 Gross per 24 hour  Intake 36.13 ml  Output 200 ml  Net -163.87 ml   Weight change: 0.4 kg  Physical Exam: Gen:NAD, laying flat in bed CVS:RRR Resp:CTA B/L AMEQ:ASTMExt:no sig edema Neuro: awake, alert, no tremors Dialysis access: RIJ [facility] Imaging: ECHOCARDIOGRAM COMPLETE  Result Date: 01/02/2022    ECHOCARDIOGRAM REPORT   Patient Name:   Kevin SCANTLEBURYDate of Exam: 01/02/2022 Medical Rec #:  [id number]        Height:       72.0 in Accession #:    [id number]       Weight:       176.6 lb Date of Birth:  [birth year]         BSA:          2.021 m Patient Age:    89years          BP:           106/70 mmHg Patient Gender: M                 HR:           82 bpm. Exam Location:  Inpatient Procedure: 2D Echo, Cardiac Doppler and Color Doppler Indications:    R06.02 SOB  History:        Patient has prior history of Echocardiogram  examinations, most                 recent 12/05/2021. Cardiomyopathy, Stroke,                 Signs/Symptoms:Hypotension; Risk Factors:Hypertension and                 Diabetes. ESRD.  Sonographer:    Roseanna Rainbow RDCS Referring Phys: Donita Brooks  Sonographer Comments: Technically difficult study due to poor echo windows, suboptimal parasternal window, suboptimal apical window and suboptimal subcostal window. Patient had hiccups throughout exam. IMPRESSIONS  1. Left ventricular ejection fraction, by estimation, is 65 to 70%. The left ventricle has normal function. The left ventricle has no regional wall motion abnormalities. Left ventricular diastolic parameters are consistent with Grade I diastolic dysfunction (impaired relaxation).  2. Right ventricular systolic function is normal. The right ventricular size is normal.  3. The mitral valve is grossly normal. No evidence of mitral  valve regurgitation. No evidence of mitral stenosis.  4. The aortic valve is tricuspid. Aortic valve regurgitation is not visualized. Aortic valve sclerosis is present, with no evidence of aortic valve stenosis.  5. The inferior vena cava is normal in size with greater than 50% respiratory variability, suggesting right atrial pressure of 3 mmHg. Comparison(s): No significant change from prior study. FINDINGS  Left Ventricle: Left ventricular ejection fraction, by estimation, is 65 to 70%. The left ventricle has normal function. The left ventricle has no regional wall motion abnormalities. The left ventricular internal cavity size was normal in size. There is  no left ventricular hypertrophy. Left ventricular diastolic parameters are consistent with Grade I diastolic dysfunction (impaired relaxation). Right Ventricle: The right ventricular size is normal. No increase in right ventricular wall thickness. Right ventricular systolic function is normal. Left Atrium: Left atrial size was normal in size. Right Atrium: Right atrial size was normal in size. Pericardium: There is no evidence of pericardial effusion. Mitral Valve: The mitral valve is grossly normal. No evidence of mitral valve regurgitation. No evidence of mitral valve stenosis. Tricuspid Valve: The tricuspid valve is grossly normal. Tricuspid valve regurgitation is trivial. No evidence of tricuspid stenosis. Aortic Valve: The aortic valve is tricuspid. Aortic valve regurgitation is not visualized. Aortic valve sclerosis is present, with no evidence of aortic valve stenosis. Pulmonic Valve: The pulmonic valve was grossly normal. Pulmonic valve regurgitation is not visualized. No evidence of pulmonic stenosis. Aorta: The aortic root and ascending aorta are structurally normal, with no evidence of dilitation. Venous: The inferior vena cava is normal in size with greater than 50% respiratory variability, suggesting right atrial pressure of 3 mmHg. IAS/Shunts: The  atrial septum is grossly normal. Additional Comments: A venous catheter is visualized in the right atrium.  LEFT VENTRICLE PLAX 2D LVIDd:         3.30 cm     Diastology LVIDs:         2.40 cm     LV e' medial:    5.03 cm/s LV PW:         1.70 cm     LV E/e' medial:  10.7 LV IVS:        1.20 cm     LV e' lateral:   4.37 cm/s LVOT diam:     2.20 cm     LV E/e' lateral: 12.3 LV SV:         53 LV SV Index:   26 LVOT Area:     3.80  cm  LV Volumes (MOD) LV vol d, MOD A2C: 27.3 ml LV vol d, MOD A4C: 46.4 ml LV vol s, MOD A2C: 9.5 ml LV vol s, MOD A4C: 15.0 ml LV SV MOD A2C:     17.8 ml LV SV MOD A4C:     46.4 ml LV SV MOD BP:      25.9 ml RIGHT VENTRICLE         IVC TAPSE (M-mode): 1.5 cm  IVC diam: 2.00 cm LEFT ATRIUM             Index       RIGHT ATRIUM           Index LA diam:        2.70 cm 1.34 cm/m  RA Area:     12.80 cm LA Vol (A2C):   13.8 ml 6.83 ml/m  RA Volume:   30.60 ml  15.14 ml/m LA Vol (A4C):   12.7 ml 6.28 ml/m LA Biplane Vol: 14.4 ml 7.13 ml/m  AORTIC VALVE LVOT Vmax:   87.33 cm/s LVOT Vmean:  56.233 cm/s LVOT VTI:    0.140 m  AORTA Ao Root diam: 3.20 cm Ao Asc diam:  3.20 cm MITRAL VALVE MV Area (PHT): 3.57 cm    SHUNTS MV Decel Time: 213 msec    Systemic VTI:  0.14 m MV E velocity: 53.80 cm/s  Systemic Diam: 2.20 cm MV A velocity: 70.20 cm/s MV E/A ratio:  0.77 Eleonore Chiquito MD Electronically signed by Eleonore Chiquito MD Signature Date/Time: 01/02/2022/1:56:15 PM    Final    MR ABDOMEN MRCP WO CONTRAST  Result Date: 01/02/2022 CLINICAL DATA:  Cholecystitis, abnormal gallbladder by prior CT and MR EXAM: MRI ABDOMEN WITHOUT CONTRAST  (INCLUDING MRCP) TECHNIQUE: Multiplanar multisequence MR imaging of the abdomen was performed. Heavily T2-weighted images of the biliary and pancreatic ducts were obtained, and three-dimensional MRCP images were rendered by post processing. COMPARISON:  CT abdomen pelvis, right upper quadrant ultrasound, 01/01/2022 FINDINGS: Lower chest: No acute abnormality.  Hepatobiliary: Hepatic iron deposition. No obvious liver mass or other abnormality. No discrete gallstones. Thickened, irregular material within the gallbladder lumen, in keeping with findings of prior ultrasound (series 4, image 14). Mild gallbladder wall thickening. No biliary ductal dilatation or evidence of choledocholithiasis. Pancreas: Unremarkable. No pancreatic ductal dilatation or surrounding inflammatory changes. Spleen: Normal in size without significant abnormality. Adrenals/Urinary Tract: Adrenal glands are unremarkable. Simple, benign bilateral renal cortical cysts for which no further follow-up or characterization is required. Kidneys are otherwise normal, without renal calculi, obvious solid lesion, or hydronephrosis. Stomach/Bowel: Stomach is within normal limits. No evidence of bowel wall thickening, distention, or inflammatory changes. Vascular/Lymphatic: No significant vascular findings are present. No enlarged abdominal lymph nodes. Other: No abdominal wall hernia or abnormality. No ascites. Musculoskeletal: No acute or significant osseous findings. IMPRESSION: 1. Thickened, irregular material within the gallbladder lumen, in keeping with findings of prior ultrasound. This is of uncertain significance, possibly reflecting gallbladder sludge, blood product, or alternately soft tissue. Contrast enhanced MR would be helpful to further evaluate. 2. Mild gallbladder wall thickening. No discrete gallstones. 3. No biliary ductal dilatation or evidence of choledocholithiasis. 4. Hepatic iron deposition. Electronically Signed   By: Delanna Ahmadi M.D.   On: 01/02/2022 07:55   DG Chest Port 1 View  Result Date: 01/02/2022 CLINICAL DATA:  Sepsis. EXAM: PORTABLE CHEST 1 VIEW COMPARISON:  01/01/2022 FINDINGS: 0448 hours. Stable asymmetric elevation right hemidiaphragm. The lungs are clear without focal pneumonia, edema,  pneumothorax or pleural effusion. The cardiopericardial silhouette is within normal  limits for size. Right IJ dialysis catheter tip overlies the right atrium. Telemetry leads overlie the chest. IMPRESSION: No acute cardiopulmonary findings. Electronically Signed   By: Misty Stanley M.D.   On: 01/02/2022 06:41   US Abdomen Limited RUQ (LIVER/GB)  Result Date: 01/01/2022 CLINICAL DATA:  Abdominal pain.  Abnormal CT. EXAM: ULTRASOUND ABDOMEN LIMITED RIGHT UPPER QUADRANT COMPARISON:  None Available. FINDINGS: Gallbladder: The gallbladder is poorly visualized. There is no gallbladder wall thickening or pericholecystic fluid. Sonographic Percell Miller sign is negative per sonographer. Gallbladder sludge is present. There is a nonmobile rounded area with linear echogenic border in the fundus of the gallbladder measuring 2.2 x 2.0 by 2.3 cm. This is nonshadowing. Common bile duct: Diameter: 3 mm.  Poorly visualized. Liver: Incompletely visualized. No gross abnormality. Echogenicity within normal limits. Portal vein is patent on color Doppler imaging with normal direction of blood flow towards the liver. Other: None. IMPRESSION: 1. Technically limited study. 2. 2.3 cm rounded area in the gallbladder fundus, indeterminate. Findings may be related to an adherent sludge ball or possibly gallbladder mass. This can be further evaluated with MRI. No sonographic evidence for acute cholecystitis. 3. The liver is not well visualized. Electronically Signed   By: Ronney Asters M.D.   On: 01/01/2022 17:06   CT ABDOMEN PELVIS WO CONTRAST  Result Date: 01/01/2022 CLINICAL DATA:  Acute nonlocalized abdominal pain, hypotension, hematemesis EXAM: CT ABDOMEN AND PELVIS WITHOUT CONTRAST TECHNIQUE: Multidetector CT imaging of the abdomen and pelvis was performed following the standard protocol without IV contrast. RADIATION DOSE REDUCTION: This exam was performed according to the departmental dose-optimization program which includes automated exposure control, adjustment of the mA and/or kV according to patient size and/or  use of iterative reconstruction technique. COMPARISON:  09/27/2021 FINDINGS: Lower chest: Minimal dependent bibasilar atelectasis Hepatobiliary: Question mild gallbladder wall thickening and surrounding infiltrative changes. Liver unremarkable. Pancreas: Normal appearance Spleen: Normal appearance Adrenals/Urinary Tract: Adrenal glands normal appearance. Small mid LEFT renal cyst unchanged, 16 mm diameter. Kidneys, ureters, and bladder otherwise unremarkable. Stomach/Bowel: Normal appendix. Scattered stool throughout colon and rectum. No bowel wall thickening or evidence of obstruction. Stomach unremarkable. Vascular/Lymphatic: Atherosclerotic calcifications aorta, iliac arteries, coronary arteries. Aorta normal caliber. No adenopathy. Reproductive: Prior seed implant prostate gland. Other: BILATERAL hernias containing fat. No free air or free fluid. Umbilical hernia containing fat. Musculoskeletal: Lipoma of LEFT iliopsoas tendon distally. Degenerative disc disease changes L4-L5 and L5-S1. Chronic superior endplate irregularity anteriorly L4. No acute osseous findings. IMPRESSION: Question mild gallbladder wall thickening and surrounding infiltrative changes, acute cholecystitis not excluded; recommend correlation with LFTs and consider RIGHT upper quadrant abdominal ultrasound assessment. BILATERAL inguinal and umbilical hernias containing fat. Aortic Atherosclerosis (ICD10-I70.0). Electronically Signed   By: Lavonia Dana M.D.   On: 01/01/2022 15:48   CT Head Wo Contrast  Result Date: 01/01/2022 CLINICAL DATA:  Hypotension.  Head trauma. EXAM: CT HEAD WITHOUT CONTRAST TECHNIQUE: Contiguous axial images were obtained from the base of the skull through the vertex without intravenous contrast. RADIATION DOSE REDUCTION: This exam was performed according to the departmental dose-optimization program which includes automated exposure control, adjustment of the mA and/or kV according to patient size and/or use of  iterative reconstruction technique. COMPARISON:  01/19/2021 FINDINGS: Brain: Significantly age advanced cerebral and cerebellar atrophy. Moderate low density in the periventricular white matter likely related to small vessel disease. Bilateral, left greater than right cerebellar hemispheric remote infarcts. No mass lesion, hemorrhage,  hydrocephalus, acute infarct, intra-axial, or extra-axial fluid collection. Vascular: Intracranial atherosclerosis. No hyperdense vessel or unexpected calcification. Skull: No significant soft tissue swelling. Cerumen in the left external ear canal. No skull fracture. Sinuses/Orbits: Hyperattenuation in the left globe is similar and likely related to chronic hematoma. Clear paranasal sinuses and mastoid air cells. Other: None. IMPRESSION: 1. No acute intracranial abnormality. 2. Significantly age advanced cerebral and cerebellar atrophy with periventricular white matter small vessel ischemic change. 3. Remote bilateral cerebellar hemispheric infarcts. Electronically Signed   By: Abigail Miyamoto M.D.   On: 01/01/2022 15:44   DG Chest Portable 1 View  Result Date: 01/01/2022 CLINICAL DATA:  Weakness, hematemesis. EXAM: PORTABLE CHEST 1 VIEW COMPARISON:  12/03/2021 and CT chest 01/19/2021. FINDINGS: Right IJ dialysis catheter tip is in the right atrium. Trachea is midline. Heart size stable. Lungs are somewhat low in volume with streaky atelectasis or scarring in the lung bases. No airspace consolidation or pleural fluid. IMPRESSION: No acute findings. Electronically Signed   By: Lorin Picket M.D.   On: 01/01/2022 12:53    Labs: BMET Recent Labs  Lab 01/01/22 1200 01/01/22 1226 01/02/22 0130 01/03/22 0448  NA 136 136 140 134*  K 3.8 3.8 3.4* 3.9  CL 91* 95* 100 99  CO2 20*  --  27 24  GLUCOSE 297* 287* 111* 89  BUN 28* 31* 29* 26*  CREATININE 5.72* 5.90* 4.34* 2.71*  CALCIUM 10.2  --  9.4 9.5  PHOS  --   --  4.3  --    CBC Recent Labs  Lab 01/01/22 1200  01/01/22 1226 01/02/22 0130 01/03/22 0448  WBC 18.6*  --  13.5* 10.0  NEUTROABS 14.7*  --   --   --   HGB 12.6* 14.6 11.1* 11.5*  HCT 39.6 43.0 34.7* 34.9*  MCV 90.8  --  89.2 88.8  PLT 280  --  224 258    Medications:     aspirin EC  81 mg Oral Daily   Chlorhexidine Gluconate Cloth  6 each Topical Daily   fluticasone  2 spray Each Nare Daily   heparin  5,000 Units Subcutaneous Q8H   insulin aspart  0-9 Units Subcutaneous Q4H   pantoprazole  40 mg Oral BID      Gean Quint, MD [facility] 01/03/2022, 9:43 AM

## 2022-01-04 DIAGNOSIS — I120 Hypertensive chronic kidney disease with stage 5 chronic kidney disease or end stage renal disease: Secondary | ICD-10-CM

## 2022-01-04 DIAGNOSIS — T465X5A Adverse effect of other antihypertensive drugs, initial encounter: Secondary | ICD-10-CM

## 2022-01-04 DIAGNOSIS — I952 Hypotension due to drugs: Secondary | ICD-10-CM

## 2022-01-04 DIAGNOSIS — E1169 Type 2 diabetes mellitus with other specified complication: Secondary | ICD-10-CM

## 2022-01-04 DIAGNOSIS — E785 Hyperlipidemia, unspecified: Secondary | ICD-10-CM

## 2022-01-04 DIAGNOSIS — Z992 Dependence on renal dialysis: Secondary | ICD-10-CM

## 2022-01-04 DIAGNOSIS — I2510 Atherosclerotic heart disease of native coronary artery without angina pectoris: Secondary | ICD-10-CM

## 2022-01-04 LAB — BASIC METABOLIC PANEL
Anion gap: 10 (ref 5–15)
BUN: 25 mg/dL — ABNORMAL HIGH (ref 8–23)
CO2: 27 mmol/L (ref 22–32)
Calcium: 9.2 mg/dL (ref 8.9–10.3)
Chloride: 99 mmol/L (ref 98–111)
Creatinine, Ser: 2.43 mg/dL — ABNORMAL HIGH (ref 0.61–1.24)
GFR, Estimated: 29 mL/min — ABNORMAL LOW (ref >60)
Glucose, Bld: 114 mg/dL — ABNORMAL HIGH (ref 70–99)
Potassium: 3.6 mmol/L (ref 3.5–5.1)
Sodium: 136 mmol/L (ref 135–145)

## 2022-01-04 LAB — CBC
HCT: 31.4 % — ABNORMAL LOW (ref 39.0–52.0)
Hemoglobin: 10.4 g/dL — ABNORMAL LOW (ref 13.0–17.0)
MCH: 29.1 pg (ref 26.0–34.0)
MCHC: 33.1 g/dL (ref 30.0–36.0)
MCV: 87.7 fL (ref 80.0–100.0)
Platelets: 253 10*3/uL (ref 150–400)
RBC: 3.58 MIL/uL — ABNORMAL LOW (ref 4.22–5.81)
RDW: 16.9 % — ABNORMAL HIGH (ref 11.5–15.5)
WBC: 9.8 10*3/uL (ref 4.0–10.5)
nRBC: 0.2 % (ref 0.0–0.2)

## 2022-01-04 LAB — GLUCOSE, CAPILLARY
Glucose-Capillary: 105 mg/dL — ABNORMAL HIGH (ref 70–99)
Glucose-Capillary: 105 mg/dL — ABNORMAL HIGH (ref 70–99)
Glucose-Capillary: 107 mg/dL — ABNORMAL HIGH (ref 70–99)
Glucose-Capillary: 111 mg/dL — ABNORMAL HIGH (ref 70–99)
Glucose-Capillary: 113 mg/dL — ABNORMAL HIGH (ref 70–99)
Glucose-Capillary: 133 mg/dL — ABNORMAL HIGH (ref 70–99)
Glucose-Capillary: 138 mg/dL — ABNORMAL HIGH (ref 70–99)

## 2022-01-04 NOTE — TOC Initial Note (Signed)
Transition of Care [facility]) - Initial/Assessment Note    Patient Details  Name: Kevin Soto MRN: [id number] Date of Birth: 1961-03-30  Transition of Care [facility]) CM/SW Contact:    Cyndi Bender, RN Phone Number: 01/04/2022, 8:49 AM  Clinical Narrative:                 Spoke to patient at bedside with sister on the phone. Patient recently discharged from [facility]. Patient has used Nurse, learning disability for home health needs in the past and would like to use them again.  Spoke with Tommi Rumps with [facility] and referral accepted Patient has all needed DME.  Address, Phone number and PCP verified.   Expected Discharge Plan: [place] Barriers to Discharge: Continued Medical Work up   Patient Goals and CMS Choice Patient states their goals for this hospitalization and ongoing recovery are:: return home CMS Medicare.gov Compare Post Acute Care list provided to:: Patient Choice offered to / list presented to : Patient  Expected Discharge Plan and Services Expected Discharge Plan: [place] In-house Referral: Clinical Social Work Discharge Planning Services: CM Consult   Living arrangements for the past 2 months: [place]                                      Prior Living Arrangements/Services Living arrangements for the past 2 months: Single Family Home Lives with:: Siblings Patient language and need for interpreter reviewed:: Yes Do you feel safe going back to the place where you live?: Yes      Need for Family Participation in Patient Care: Yes (Comment) Care giver support system in place?: Yes (comment) Current home services: DME Criminal Activity/Legal Involvement Pertinent to Current Situation/Hospitalization: No - Comment as needed  Activities of Daily Living      Permission Sought/Granted                  Emotional Assessment Appearance:: Appears stated age Attitude/Demeanor/Rapport: Engaged Affect (typically  observed): Accepting Orientation: : Oriented to Self, Oriented to Place, Oriented to  Time, Oriented to Situation Alcohol / Substance Use: Not Applicable Psych Involvement: No (comment)  Admission diagnosis:  Hypovolemic shock ([place]) [R57.1] Hypotension [I95.9] Patient Active Problem List   Diagnosis Date Noted   Pressure injury of skin 01/03/2022   Hypotension 01/01/2022   Sepsis ([place]) 12/03/2021   Septic shock ([place]) 12/03/2021   ESRD (end stage renal disease) on dialysis ([place]) 11/08/2021   Duodenitis    Gastroesophageal reflux disease with esophagitis without hemorrhage    Hiatal hernia    Acute renal failure ([place])    Uremia 09/27/2021   Type 2 diabetes mellitus with complication, without long-term current use of insulin ([place]) 07/21/2020   Dyslipidemia 07/21/2020   Memory change 05/07/2018   Chronic nasal congestion 11/12/2017   Malignant neoplasm of prostate ([place]) 03/11/2017   CAD (coronary artery disease)    Erectile dysfunction 05/08/2016   Advanced care planning/counseling discussion 01/31/2016   CKD (chronic kidney disease) stage 4, GFR 15-29 ml/min (HCC) 08/18/2014   Normocytic anemia 06/24/2012   Preventative health care 06/24/2012   Unsteady gait when walking 02/27/2011   Hyperlipidemia due to type 2 diabetes mellitus ([place]) 02/16/2011   Atherosclerotic heart disease of native coronary artery with unspecified angina pectoris ([place]) 01/26/2011   Diabetes mellitus type 2 with neurological manifestations ([place]) 12/15/2010   Dilated cardiomyopathy ([place])  Hypertension associated with diabetes [facility])    CVA (cerebral vascular accident) ([place]) 11/28/2010   PCP:  Romana Juniper, MD Pharmacy:   [facility] ([place], [facility] [address] [place] [address] Phone: [phone] Fax: [phone]  [facility] ([facility]) - [place], [place] [address] Phone: [phone] Fax: [place] [address] [place] [address] Phone: [phone] Fax: [phone]  [id number] [id number]: [phone]: [phone]    Social Determinants of Health (SDOH) Interventions    Readmission Risk Interventions     No data to display

## 2022-01-04 NOTE — Care Management Important Message (Signed)
Important Message  Patient Details  Name: Kevin Soto MRN: [id number] Date of Birth: 15-Apr-1961   Medicare Important Message Given:  Yes     Tiago Humphrey 01/04/2022, 3:03 PM

## 2022-01-04 NOTE — Progress Notes (Signed)
Subjective:   Summary: Kevin Soto is a 61 y.o. year old male currently admitted on the IMTS HD#3 for hypotension likely from accidental medication use.  Overnight Events: NOE   Pt was seen today in the PM. He has a good understanding of what nephrology has said to him, and endorses feeling well.   Objective:  Vital signs in last 24 hours: Vitals:   01/04/22 0313 01/04/22 0500 01/04/22 0800 01/04/22 1134  BP: 114/63  130/84 (!) 138/97  Pulse: 75  89 78  Resp: '16  20 19  '$ Temp: 98.3 F (36.8 C)  98 F (36.7 C) 97.9 F (36.6 C)  TempSrc: Oral  Oral Oral  SpO2: 99%   98%  Weight:  81.4 kg     Supplemental O2: Room Air SpO2: 98 %   Physical Exam:  Constitutional: well-appearing man, laying in bed, in no acute distress Cardiovascular: RRR, no murmurs, rubs or gallops Pulmonary/Chest: normal work of breathing on room air, lungs clear to auscultation bilaterally Abdominal: soft, non-tender, non-distended Skin: warm and dry Extremities: upper/lower extremity pulses 2+, no lower extremity edema present  Filed Weights   01/02/22 0315 01/03/22 0439 01/04/22 0500  Weight: 80.1 kg 82 kg 81.4 kg     Intake/Output Summary (Last 24 hours) at 01/04/2022 1708 Last data filed at 01/04/2022 1134 Gross per 24 hour  Intake --  Output 800 ml  Net -800 ml   Net IO Since Admission: 122.34 mL [01/04/22 1708]  Pertinent Labs:    Latest Ref Rng & Units 01/04/2022    3:31 AM 01/03/2022    4:48 AM 01/02/2022    1:30 AM  CBC  WBC 4.0 - 10.5 K/uL 9.8  10.0  13.5   Hemoglobin 13.0 - 17.0 g/dL 10.4  11.5  11.1   Hematocrit 39.0 - 52.0 % 31.4  34.9  34.7   Platelets 150 - 400 K/uL 253  258  224        Latest Ref Rng & Units 01/04/2022    3:31 AM 01/03/2022    4:48 AM 01/02/2022    1:30 AM  CMP  Glucose 70 - 99 mg/dL 114  89  111   BUN 8 - 23 mg/dL '25  26  29   '$ Creatinine 0.61 - 1.24 mg/dL 2.43  2.71  4.34   Sodium 135 - 145 mmol/L 136  134  140   Potassium 3.5  - 5.1 mmol/L 3.6  3.9  3.4   Chloride 98 - 111 mmol/L 99  99  100   CO2 22 - 32 mmol/L '27  24  27   '$ Calcium 8.9 - 10.3 mg/dL 9.2  9.5  9.4     Assessment/Plan:   Principal Problem:   Hypotension Active Problems:   Hypertension associated with diabetes (HCC)   Diabetes mellitus type 2 with neurological manifestations (HCC)   Normocytic anemia   Duodenitis   ESRD (end stage renal disease) on dialysis (HCC)   Pressure injury of skin   Patient Summary: Kevin Soto is a 61 y.o. with a pertinent PMH of ESRD, CVA, HTN, diabetes, prostate cancer, OSA, who presented with weakness and abdominal pain and admitted for hypotension likely from medications responsive to fluid resuscitation.    #Medication Induced Hypotension in the Setting of ESRD Sepsis has been ruled out as a cause of his hypotension in the acute setting because his lactic  acid was trending down post fluid resuscitation. Pt was previously on anti-hypertensive medications but after being on pressors and admitted to the ICU on 7/16, he was advised to not continue them, however still continued to do so. Nephrology is on board, and due to his improving creatinine function they are considering D/Cing dialysis. He had just recently started going to dialysis in May on TTS, and last HD was Saturday.   Plan:  -Continue monitoring  -Await nephrology decision on dialysis continuance  #HTN, HLD, CAD Continuing aspirin, holding beta blocker and ace inhibitor   #Type 2 Diabetes  Plan:  -SSI -Trulicity discussion because of low BMI  Code: Full    Dispo: Anticipated discharge in less than 2 midnights.   Kevin Opitz, MD PGY-1 Internal Medicine Resident Pager Number [phone] Please contact the on call pager after 5 pm and on weekends at [phone].

## 2022-01-04 NOTE — Progress Notes (Signed)
[name] Progress Note    Assessment/ Plan:   ESRD:  -outpatient orders: [facility], TTS. 4hrs. F180. Flow rates: 400/600. EDW 79.3kg. 4K, 2.25Cal. Meds: mircera 171mg q2weeks (last dose 8/10). Venofer load '100mg'$  qtreatment (has 5 more doses left at the time of admit). -recently started on HD in May 2023. S/p Renal biopsy in 09/2021: ATI w/ occasional myoglobin casts, diabetic neuropathy (class IIb), moderate IF/TA (however small cortical sample) -Cr continues to improve without dialysis. Although too early to say, he may very well be in renal recovery at this junction Continue to hold dialysis today and if his Cr continues to be stable or improve, [facility]. -no indications for RRT today--euvolemic on exam, no uremic symptoms   Shock -etiology? Possibly hypovolemic in nature given exam and given that he has responded to fluids. BP now stable, out of ICU now.   Lactic acidosis -improved   Volume: EDW 79.3kg (recently lowered given that he was under his EDW).  Monitor strict I/O if possible.   Anemia of Chronic Kidney Disease: Hemoglobin 12.6 on presentation, now 14.6, likely hemoconcentrated. Received ESA last on 8/10, not due for it until 8/24. Receiving Fe load as an outpatient-will hold for now. Hgb remains at goal 11.5 today   Secondary Hyperparathyroidism/Hyperphosphatemia: not on binders, VDRA, or Vit D analogues. Monitor for now. Renal diet recommended. PO4 WNL   Recommendations were discussed with the primary team.  Subjective:   No acute events overnight. He has no complaints this am. Did try to pull at PIV so now in mittens.  Denies chest pain, shortness of breath, swelling, diminished urinary frequency (typically urinates about 2-3 times a day, adequate amounts), nausea/vomiting, dysgeusia, loss of appetite, intractable hiccups/pruritus, brain fog.    Objective:   BP 114/63 (BP Location: Right Arm)   Pulse 75   Temp 98.3 F  (36.8 C) (Oral)   Resp 16   Wt 81.4 kg   SpO2 99%   BMI 24.34 kg/m   Intake/Output Summary (Last 24 hours) at 01/04/2022 0912 Last data filed at 01/04/2022 0532 Gross per 24 hour  Intake --  Output 400 ml  Net -400 ml    Weight change: -0.6 kg  Physical Exam: Gen:NAD, laying flat in bed CVS:RRR Resp:CTA B/L AYQM:VHQIExt:no sig edema Neuro: awake, alert, no tremors [facility] ECH[facility] COMPLETE  Result Date: 01/02/2022    ECHOCARDIOGRAM REPORT   Patient Name:   Kevin COWARDDate of Exam: 01/02/2022 Medical Rec #:  [id number]        Height:       72.0 in Accession #:    [id number]       Weight:       176.6 lb Date of Birth:  202-07-1960         BSA:          2.021 m Patient Age:    89years          BP:           106/70 mmHg Patient Gender: M                 HR:           82 bpm. Exam Location:  Inpatient Procedure: 2D Echo, Cardiac Doppler and Color Doppler Indications:    R06.02 SOB  History:        Patient has prior history of Echocardiogram examinations, most  recent 12/05/2021. Cardiomyopathy, Stroke,                 Signs/Symptoms:Hypotension; Risk Factors:Hypertension and                 Diabetes. ESRD.  Sonographer:    Roseanna Rainbow RDCS Referring Phys: Donita Brooks  Sonographer Comments: Technically difficult study due to poor echo windows, suboptimal parasternal window, suboptimal apical window and suboptimal subcostal window. Patient had hiccups throughout exam. IMPRESSIONS  1. Left ventricular ejection fraction, by estimation, is 65 to 70%. The left ventricle has normal function. The left ventricle has no regional wall motion abnormalities. Left ventricular diastolic parameters are consistent with Grade I diastolic dysfunction (impaired relaxation).  2. Right ventricular systolic function is normal. The right ventricular size is normal.  3. The mitral valve is grossly normal. No evidence of mitral valve regurgitation. No evidence of  mitral stenosis.  4. The aortic valve is tricuspid. Aortic valve regurgitation is not visualized. Aortic valve sclerosis is present, with no evidence of aortic valve stenosis.  5. The inferior vena cava is normal in size with greater than 50% respiratory variability, suggesting right atrial pressure of 3 mmHg. Comparison(s): No significant change from prior study. FINDINGS  Left Ventricle: Left ventricular ejection fraction, by estimation, is 65 to 70%. The left ventricle has normal function. The left ventricle has no regional wall motion abnormalities. The left ventricular internal cavity size was normal in size. There is  no left ventricular hypertrophy. Left ventricular diastolic parameters are consistent with Grade I diastolic dysfunction (impaired relaxation). Right Ventricle: The right ventricular size is normal. No increase in right ventricular wall thickness. Right ventricular systolic function is normal. Left Atrium: Left atrial size was normal in size. Right Atrium: Right atrial size was normal in size. Pericardium: There is no evidence of pericardial effusion. Mitral Valve: The mitral valve is grossly normal. No evidence of mitral valve regurgitation. No evidence of mitral valve stenosis. Tricuspid Valve: The tricuspid valve is grossly normal. Tricuspid valve regurgitation is trivial. No evidence of tricuspid stenosis. Aortic Valve: The aortic valve is tricuspid. Aortic valve regurgitation is not visualized. Aortic valve sclerosis is present, with no evidence of aortic valve stenosis. Pulmonic Valve: The pulmonic valve was grossly normal. Pulmonic valve regurgitation is not visualized. No evidence of pulmonic stenosis. Aorta: The aortic root and ascending aorta are structurally normal, with no evidence of dilitation. Venous: The inferior vena cava is normal in size with greater than 50% respiratory variability, suggesting right atrial pressure of 3 mmHg. IAS/Shunts: The atrial septum is grossly normal.  Additional Comments: A venous catheter is visualized in the right atrium.  LEFT VENTRICLE PLAX 2D LVIDd:         3.30 cm     Diastology LVIDs:         2.40 cm     LV e' medial:    5.03 cm/s LV PW:         1.70 cm     LV E/e' medial:  10.7 LV IVS:        1.20 cm     LV e' lateral:   4.37 cm/s LVOT diam:     2.20 cm     LV E/e' lateral: 12.3 LV SV:         53 LV SV Index:   26 LVOT Area:     3.80 cm  LV Volumes (MOD) LV vol d, MOD A2C: 27.3 ml LV vol d, MOD A4C: 46.4  ml LV vol s, MOD A2C: 9.5 ml LV vol s, MOD A4C: 15.0 ml LV SV MOD A2C:     17.8 ml LV SV MOD A4C:     46.4 ml LV SV MOD BP:      25.9 ml RIGHT VENTRICLE         IVC TAPSE (M-mode): 1.5 cm  IVC diam: 2.00 cm LEFT ATRIUM             Index       RIGHT ATRIUM           Index LA diam:        2.70 cm 1.34 cm/m  RA Area:     12.80 cm LA Vol (A2C):   13.8 ml 6.83 ml/m  RA Volume:   30.60 ml  15.14 ml/m LA Vol (A4C):   12.7 ml 6.28 ml/m LA Biplane Vol: 14.4 ml 7.13 ml/m  AORTIC VALVE LVOT Vmax:   87.33 cm/s LVOT Vmean:  56.233 cm/s LVOT VTI:    0.140 m  AORTA Ao Root diam: 3.20 cm Ao Asc diam:  3.20 cm MITRAL VALVE MV Area (PHT): 3.57 cm    SHUNTS MV Decel Time: 213 msec    Systemic VTI:  0.14 m MV E velocity: 53.80 cm/s  Systemic Diam: 2.20 cm MV A velocity: 70.20 cm/s MV E/A ratio:  0.77 Eleonore Chiquito MD Electronically signed by Eleonore Chiquito MD Signature Date/Time: 01/02/2022/1:56:15 PM    Final     Labs: BMET Recent Labs  Lab 01/01/22 1200 01/01/22 1226 01/02/22 0130 01/03/22 0448 01/04/22 0331  NA 136 136 140 134* 136  K 3.8 3.8 3.4* 3.9 3.6  CL 91* 95* 100 99 99  CO2 20*  --  '27 24 27  '$ GLUCOSE 297* 287* 111* 89 114*  BUN 28* 31* 29* 26* 25*  CREATININE 5.72* 5.90* 4.34* 2.71* 2.43*  CALCIUM 10.2  --  9.4 9.5 9.2  PHOS  --   --  4.3  --   --     CBC Recent Labs  Lab 01/01/22 1200 01/01/22 1226 01/02/22 0130 01/03/22 0448 01/04/22 0331  WBC 18.6*  --  13.5* 10.0 9.8  NEUTROABS 14.7*  --   --   --   --   HGB 12.6* 14.6  11.1* 11.5* 10.4*  HCT 39.6 43.0 34.7* 34.9* 31.4*  MCV 90.8  --  89.2 88.8 87.7  PLT 280  --  224 258 253     Medications:     aspirin EC  81 mg Oral Daily   Chlorhexidine Gluconate Cloth  6 each Topical Daily   fluticasone  2 spray Each Nare Daily   heparin  5,000 Units Subcutaneous Q8H   insulin aspart  0-9 Units Subcutaneous Q4H   pantoprazole  40 mg Oral BID    Lynnda Child PA-C [facility] 01/04/2022,9:12 AM

## 2022-01-05 ENCOUNTER — Inpatient Hospital Stay (HOSPITAL_COMMUNITY): Payer: Medicare Other

## 2022-01-05 HISTORY — PX: IR REMOVAL TUN CV CATH W/O FL: IMG2289

## 2022-01-05 LAB — GLUCOSE, CAPILLARY
Glucose-Capillary: 109 mg/dL — ABNORMAL HIGH (ref 70–99)
Glucose-Capillary: 136 mg/dL — ABNORMAL HIGH (ref 70–99)
Glucose-Capillary: 98 mg/dL (ref 70–99)
Glucose-Capillary: 112 mg/dL — ABNORMAL HIGH (ref 70–99)

## 2022-01-05 LAB — RENAL FUNCTION PANEL
Albumin: 2.9 g/dL — ABNORMAL LOW (ref 3.5–5.0)
Anion gap: 8 (ref 5–15)
BUN: 20 mg/dL (ref 8–23)
CO2: 24 mmol/L (ref 22–32)
Calcium: 9.1 mg/dL (ref 8.9–10.3)
Chloride: 102 mmol/L (ref 98–111)
Creatinine, Ser: 1.96 mg/dL — ABNORMAL HIGH (ref 0.61–1.24)
GFR, Estimated: 38 mL/min — ABNORMAL LOW (ref >60)
Glucose, Bld: 124 mg/dL — ABNORMAL HIGH (ref 70–99)
Phosphorus: 2.7 mg/dL (ref 2.5–4.6)
Potassium: 3.9 mmol/L (ref 3.5–5.1)
Sodium: 134 mmol/L — ABNORMAL LOW (ref 135–145)

## 2022-01-05 LAB — CBC
HCT: 33 % — ABNORMAL LOW (ref 39.0–52.0)
Hemoglobin: 10.8 g/dL — ABNORMAL LOW (ref 13.0–17.0)
MCH: 29 pg (ref 26.0–34.0)
MCHC: 32.7 g/dL (ref 30.0–36.0)
MCV: 88.5 fL (ref 80.0–100.0)
Platelets: 242 10*3/uL (ref 150–400)
RBC: 3.73 MIL/uL — ABNORMAL LOW (ref 4.22–5.81)
RDW: 16.7 % — ABNORMAL HIGH (ref 11.5–15.5)
WBC: 8.6 10*3/uL (ref 4.0–10.5)
nRBC: 0 % (ref 0.0–0.2)

## 2022-01-05 MED ORDER — LIDOCAINE HCL 1 % IJ SOLN
INTRAMUSCULAR | Status: AC
  Filled 2022-01-05: qty 20

## 2022-01-05 NOTE — Progress Notes (Signed)
Occupational Therapy Treatment Patient Details Name: Kevin Soto MRN: [id number] DOB: 05-23-1960 Today's Date: 01/05/2022   History of present illness 61 yo male [facility] on 8/14 with emesis, progressive weakness, falls. Recent admission 11/2021 for sepsis with d/c to SNF, got home on 8/11. PMH includes ESRD on HD TTS, CVA, DM, HFrEF 2/2 ICM, HTN, OSA, prostate cancer.   OT comments  Pt with slow progression towards established OT goals this date, initially requiring mod A for sit<>stand transfer. Requiring min guard on 2 additional attempts, but mod-max cues for sequencing and safety. Pt with elevated BP on standing (see below), so defer functional mobility for safety. Pt performing oral care seated with supervision. Pt requiring significantly increased time due to decreased vision. Challenging cognition this session with therapist naming word that starts with A and pt naming word that begins with A through letter S. Pt requiring min-mod cues. Recommend discharge home with frequent supervision and HHOT to optimize safety and independence in home setting.    Recommendations for follow up therapy are one component of a multi-disciplinary discharge planning process, led by the attending physician.  Recommendations may be updated based on patient status, additional functional criteria and insurance authorization.    Follow Up Recommendations  Home health OT    Assistance Recommended at Discharge Frequent or constant Supervision/Assistance  Patient can return home with the following  A little help with walking and/or transfers;A little help with bathing/dressing/bathroom;Assistance with cooking/housework;Direct supervision/assist for medications management;Direct supervision/assist for financial management;Assist for transportation;Help with stairs or ramp for entrance   Equipment Recommendations  None recommended by OT (Per pt, he has all recommended equipment)    Recommendations for  Other Services      Precautions / Restrictions Precautions Precautions: Fall Precaution Comments: watch BP Restrictions Weight Bearing Restrictions: No       Mobility Bed Mobility Overal bed mobility: Needs Assistance Bed Mobility: Supine to Sit, Sit to Supine     Supine to sit: Min guard Sit to supine: Min guard   General bed mobility comments: Cues for problem solving and sequencing    Transfers Overall transfer level: Needs assistance Equipment used: Rolling walker (2 wheels) Transfers: Sit to/from Stand Sit to Stand: Min guard           General transfer comment: for safety, slow to rise with use of UEs from EOB to power up. Mod cues for safety and technique     Balance Overall balance assessment: Needs assistance, History of Falls Sitting-balance support: No upper extremity supported, Feet supported Sitting balance-Leahy Scale: Fair Sitting balance - Comments: Donning/doffing socks sitting EOB   Standing balance support: Bilateral upper extremity supported, During functional activity Standing balance-Leahy Scale: Poor Standing balance comment: reliant on RW                           ADL either performed or assessed with clinical judgement   ADL Overall ADL's : Needs assistance/impaired     Grooming: Oral care;Supervision/safety;Set up;Sitting (1-2 cues for problem solving) Grooming Details (indicate cue type and reason): Performed in sitting due to elevated BP upon standing             Lower Body Dressing: Sit to/from stand;Supervision/safety Lower Body Dressing Details (indicate cue type and reason): Donning socks with figure 4 position Toilet Transfer: Moderate assistance;Min guard Toilet Transfer Details (indicate cue type and reason): Simulated in room as if stand pivot (taking side steps). Pt  with elevated BP, so deferring functional mobility for safety. Mod A for initial stand. Min guar for additional, but mod cues for technique and  nose over toes before rise         Functional mobility during ADLs: Min guard;Rolling walker (2 wheels) General ADL Comments: Pt presented with decreased balance, strength, cognition, and activity tolerance.    Extremity/Trunk Assessment Upper Extremity Assessment Upper Extremity Assessment: Generalized weakness RUE Deficits / Details: 4+/5 strength, coordination appropriate given age and dx LUE Deficits / Details: 4+/5 strength; coordination disorganized and slowed compared to R   Lower Extremity Assessment Lower Extremity Assessment: Defer to PT evaluation        Vision   Vision Assessment?: Vision impaired- to be further tested in functional context Additional Comments: Pt locating oral care items during session. visual deficits present at baseline   Perception Perception Perception: Not tested   Praxis Praxis Praxis: Not tested    Cognition Arousal/Alertness: Awake/alert Behavior During Therapy: Flat affect Overall Cognitive Status: No family/caregiver present to determine baseline cognitive functioning Area of Impairment: Following commands, Safety/judgement, Awareness, Problem solving                   Current Attention Level: Selective   Following Commands: Follows one step commands with increased time, Follows one step commands consistently, Follows multi-step commands inconsistently Safety/Judgement: Decreased awareness of safety, Decreased awareness of deficits Awareness: Emergent Problem Solving: Slow processing, Requires verbal cues, Difficulty sequencing General Comments: Challenging problem solving and attention with therapist naming word that starts with A and pt naming word that starts with b  through S. Pt requiring min-mod cues for problem solving and sequencing during transfers and functional mobility.Pt oriented with multiple choice questions.        Exercises      Shoulder Instructions       General Comments Pt BP elevating to 172/133  in standing. 152/117 with rest siting EOB. RN notified    Pertinent Vitals/ Pain       Pain Assessment Pain Assessment: No/denies pain Pain Intervention(s): Monitored during session  Home Living                                          Prior Functioning/Environment              Frequency  Min 2X/week        Progress Toward Goals  OT Goals(current goals can now be found in the care plan section)  Progress towards OT goals: Progressing toward goals (Pt erquiring more physical A on first attempt to stand today)  Acute Rehab OT Goals Patient Stated Goal: none stated OT Goal Formulation: With patient Time For Goal Achievement: 01/17/22 Potential to Achieve Goals: Good ADL Goals Pt Will Perform Grooming: with modified independence;standing Pt Will Perform Lower Body Dressing: with modified independence;sit to/from stand Pt Will Transfer to Toilet: with modified independence;ambulating;regular height toilet;grab bars Pt Will Perform Toileting - Clothing Manipulation and hygiene: with supervision;sit to/from stand;sitting/lateral leans Pt Will Perform Tub/Shower Transfer: Tub transfer;Shower transfer;with modified independence;ambulating;shower seat;rolling walker  Plan Discharge plan remains appropriate    Co-evaluation                 AM-PAC OT "6 Clicks" Daily Activity     Outcome Measure   Help from another person eating meals?: None Help from another person taking care of personal  grooming?: A Little Help from another person toileting, which includes using toliet, bedpan, or urinal?: A Little Help from another person bathing (including washing, rinsing, drying)?: A Little Help from another person to put on and taking off regular upper body clothing?: A Little Help from another person to put on and taking off regular lower body clothing?: A Little 6 Click Score: 19    End of Session Equipment Utilized During Treatment: Gait belt;Rolling  walker (2 wheels)  OT Visit Diagnosis: Unsteadiness on feet (R26.81);Other abnormalities of gait and mobility (R26.89);Muscle weakness (generalized) (M62.81);History of falling (Z91.81);Other symptoms and signs involving cognitive function   Activity Tolerance Patient tolerated treatment well   Patient Left in bed;with call bell/phone within reach;with bed alarm set   Nurse Communication Mobility status;Other (comment) (BP)        Time: 1259-1330 OT Time Calculation (min): 31 min  Charges: OT General Charges $OT Visit: 1 Visit OT Treatments $Self Care/Home Management : 23-37 mins  Shanda Howells, OTR/L [facility] Acute Rehabilitation Office: [phone]   [id number] 01/05/2022, 1:50 PM

## 2022-01-05 NOTE — Progress Notes (Signed)
Physical Therapy Treatment Patient Details Name: Kevin Soto MRN: [id number] DOB: Aug 12, 1960 Today's Date: 01/05/2022   History of Present Illness 61 yo male presents to [facility] on 8/14 with emesis, progressive weakness, falls. Recent admission 11/2021 for sepsis with d/c to SNF, got home on 8/11. PMH includes ESRD on HD TTS, CVA, DM, HFrEF 2/2 ICM, HTN, OSA, prostate cancer.    PT Comments    Pt eager to d/c home today, agreeable to focus on functional mobility to prepare for home. Pt ambulatory for short hallway distance x2 with use of RW, PT mandating rest break for tachycardia up to 153 bpm, pt asymptomatic. Pt also with BP elevation to 150s/120s post-gait, PT notified RN of both tachycardia and HTN.   Plan for HHPT and support of sister, progressing well.     Recommendations for follow up therapy are one component of a multi-disciplinary discharge planning process, led by the attending physician.  Recommendations may be updated based on patient status, additional functional criteria and insurance authorization.  Follow Up Recommendations  Home health PT Can patient physically be transported by private vehicle: Yes   Assistance Recommended at Discharge Intermittent Supervision/Assistance  Patient can return home with the following A little help with walking and/or transfers;A little help with bathing/dressing/bathroom;Assistance with cooking/housework;Assist for transportation;Help with stairs or ramp for entrance   Equipment Recommendations  None recommended by PT    Recommendations for Other Services       Precautions / Restrictions Precautions Precautions: Fall Precaution Comments: watch BP Restrictions Weight Bearing Restrictions: No     Mobility  Bed Mobility Overal bed mobility: Needs Assistance Bed Mobility: Supine to Sit, Sit to Supine     Supine to sit: Min guard Sit to supine: Min guard   General bed mobility comments: HOB elevation     Transfers Overall transfer level: Needs assistance Equipment used: Rolling walker (2 wheels) Transfers: Sit to/from Stand Sit to Stand: Min guard           General transfer comment: slow to rise and steady, cues for safe hand placement when rising and sitting. STS x3 from EOB x2 and chair in hallawy x1    Ambulation/Gait Ambulation/Gait assistance: Min guard Gait Distance (Feet): 50 Feet (x2 - seated rest break) Assistive device: Rolling walker (2 wheels) Gait Pattern/deviations: Step-through pattern, Decreased stride length, Drifts right/left, Decreased weight shift to left, Wide base of support Gait velocity: decr     General Gait Details: cues for upright posture, placement in RW. Tachycardia up to 153 bpm during gait, PT mandating seated rest break to recover to 110s. Pt asymptomatic   Stairs             Wheelchair Mobility    Modified Rankin (Stroke Patients Only)       Balance Overall balance assessment: Needs assistance, History of Falls Sitting-balance support: No upper extremity supported, Feet supported Sitting balance-Leahy Scale: Fair Sitting balance - Comments: Donning/doffing socks sitting EOB   Standing balance support: Bilateral upper extremity supported, During functional activity Standing balance-Leahy Scale: Poor Standing balance comment: reliant on RW                            Cognition Arousal/Alertness: Awake/alert Behavior During Therapy: Flat affect Overall Cognitive Status: No family/caregiver present to determine baseline cognitive functioning Area of Impairment: Following commands, Safety/judgement, Awareness, Problem solving  Current Attention Level: Selective   Following Commands: Follows one step commands with increased time, Follows one step commands consistently, Follows multi-step commands inconsistently Safety/Judgement: Decreased awareness of safety, Decreased awareness of  deficits Awareness: Emergent Problem Solving: Slow processing, Requires verbal cues, Difficulty sequencing General Comments: slowed processing and response to PT questions, requires frequent safety cues        Exercises      General Comments General comments (skin integrity, edema, etc.): Pt BP elevating to 172/133 in standing. 152/117 with rest siting EOB. RN notified      Pertinent Vitals/Pain Pain Assessment Pain Assessment: No/denies pain    Home Living                          Prior Function            PT Goals (current goals can now be found in the care plan section) Acute Rehab PT Goals Patient Stated Goal: to go home PT Goal Formulation: With patient Time For Goal Achievement: 01/17/22 Potential to Achieve Goals: Good Progress towards PT goals: Progressing toward goals    Frequency    Min 3X/week      PT Plan Current plan remains appropriate    Co-evaluation              AM-PAC PT "6 Clicks" Mobility   Outcome Measure  Help needed turning from your back to your side while in a flat bed without using bedrails?: A Little Help needed moving from lying on your back to sitting on the side of a flat bed without using bedrails?: A Little Help needed moving to and from a bed to a chair (including a wheelchair)?: A Little Help needed standing up from a chair using your arms (e.g., wheelchair or bedside chair)?: A Little Help needed to walk in hospital room?: A Little Help needed climbing 3-5 steps with a railing? : A Little 6 Click Score: 18    End of Session   Activity Tolerance: Patient limited by fatigue Patient left: with call bell/phone within reach;in bed;with bed alarm set Nurse Communication: Mobility status PT Visit Diagnosis: Muscle weakness (generalized) (M62.81);Other abnormalities of gait and mobility (R26.89);Other symptoms and signs involving the nervous system (R29.898)     Time: 2563-8937 PT Time Calculation (min)  (ACUTE ONLY): 21 min  Charges:  $Therapeutic Activity: 8-22 mins                    Stacie Glaze, PT DPT Acute Rehabilitation Services Pager [phone]  Office [phone]   Roxine Caddy E Ruffin Pyo 01/05/2022, 3:59 PM

## 2022-01-05 NOTE — Discharge Summary (Cosign Needed Addendum)
Name: ROBIN PAFFORD MRN: [id number] DOB: 26-May-1960 61 y.o. PCP: Romana Juniper, MD  Date of Admission: 01/01/2022 11:37 AM Date of Discharge: 01/05/2022 Attending Physician: Angelica Pou, MD  Discharge Diagnosis: 1. Principal Problem:   Hypotension Active Problems:   Hypertension associated with diabetes ([place])   Diabetes mellitus type 2 with neurological manifestations ([place])   Normocytic anemia   Duodenitis   ESRD (end stage renal disease) on dialysis ([place])   Pressure injury of skin   Discharge Medications: Allergies as of 01/05/2022       Reactions   Clonidine Derivatives Other (See Comments)   Dizziness Drowsiness         Medication List     STOP taking these medications    acetaminophen 325 MG tablet Commonly known as: TYLENOL   aspirin EC 81 MG tablet   carvedilol 25 MG tablet Commonly known as: COREG   dorzolamide-timolol 22.3-6.8 MG/ML ophthalmic solution Commonly known as: COSOPT   hydrochlorothiazide 25 MG tablet Commonly known as: HYDRODIURIL   lisinopril 20 MG tablet Commonly known as: ZESTRIL   midodrine 5 MG tablet Commonly known as: PROAMATINE   nitroGLYCERIN 0.4 MG SL tablet Commonly known as: NITROSTAT       TAKE these medications    clopidogrel 75 MG tablet Commonly known as: PLAVIX Take 1 tablet (75 mg total) by mouth daily.   Darbepoetin Alfa 100 MCG/0.5ML Sosy injection Commonly known as: ARANESP Inject 0.5 mLs (100 mcg total) into the vein every Thursday with hemodialysis.   fluticasone 50 MCG/ACT nasal spray Commonly known as: Flonase Place 2 sprays into both nostrils daily.   pantoprazole 40 MG tablet Commonly known as: PROTONIX Take 1 tablet (40 mg total) by mouth 2 (two) times daily.   Senexon-S 8.6-50 MG tablet Generic drug: senna-docusate Take 1 tablet by mouth at bedtime as needed for mild constipation. What changed: when to take this   Synjardy XR 09-998 MG Tb24 Generic drug:  Empagliflozin-metFORMIN HCl ER Take 1 tablet by mouth in the morning and at bedtime.   tamsulosin 0.4 MG Caps capsule Commonly known as: FLOMAX Take 0.4 mg by mouth daily.   Trulicity 3 PX/1.0GY Sopn Generic drug: Dulaglutide Inject 3 mg as directed once a week.   Vitamin D (Ergocalciferol) 1.25 MG (50000 UNIT) Caps capsule Commonly known as: DRISDOL Take 50,000 Units by mouth every 7 (seven) days.        Disposition and follow-up:   Mr.Dat E Sammarco was discharged from [facility] in Good condition.  At the hospital follow up visit please address:  1.  IR Appointment for catheter removal, Urologist appt for Prostate cancer hx, eval need for synjardy and trulicity  2.  Labs / imaging needed at time of follow-up: CBC  3.  Pending labs/ test needing follow-up: NA  Follow-up Appointments:  Follow-up Information     [facility] Follow up.   Specialty: Home Health Services Why: Home Health arranged. They will contact you within 48 hours after discharge to schedule apt Contact information: [address] [place] [address] [phone]                 Hospital Course by problem list:  Mr. Laqueta Jean is a 61 year old male with a PMH of ESRD, CVA, HTN, diabetes, prostate cancer, and OSA.  #Medication induced Hypotension in the Setting of ESRD #ESRD #HTN Pt presented on 8/14 complaining of weakness and abdominal pain and was admitted  for hypotension likely from medications. He was responsive to fluid resuscitation. He was admitted on 7/16 for septic shock, and was transferred to the ICU and started on pressors. At the time, his home blood pressure medications were stopped. However, patient continued taking Lisinopril, Coreg, and Hydrochlorothiazide by mistake as he puts most of his pills in a pill box and wasn't able to clear out his medications.   CT Abdomen/pelvis was unremarkable except for questionable gallbladder  thickening that required further examination with RUQ ultrasound and MRCP, which showed thickened and irregular material within the gallbladder, GI and surgery were consulted and recommended no intervention  Pt has been on Hemodialysis since May, and would go every TTS. His last dialysis session was last Saturday. Dialysis was held throughout hospital course due to hypotension. However, creatinine levels have started to decrease as we D/C'd his anti-hypertensive. Probably more perfusion through kidneys. Nephrology deemed HD a necessity for him given his improved kidney function (Cr: 1.96 at discharge, remarkably improved from over 5 upon admission), and Interventional Radiology will take out his catheter today.   #HLD, CAD #Hx of CVA Continue aspirin, resuming Plavix  #Type 2 Diabetes In the hospital patient was on SSI, last A1c was 5.3. Will evaluate need for Synjardy due to ESRD, and Trulicity given his BMI of 24 outpatient.  #Prostate Cancer hx Follow up out patient    .   Discharge Exam:   BP 121/75 (BP Location: Left Arm)   Pulse 81   Temp 97.9 F (36.6 C) (Oral)   Resp 18   Wt 81.6 kg   SpO2 100%   BMI 24.40 kg/m  Discharge exam:   Constitutional: Well-appearing man, laying in bed, in no acute distress  Cardiovascular: RRR, no murmurs, rubs or gallops Pulmonary/Chest: Normal work of breathing on room air, lungs clear to auscultation bilaterally  Abdominal: Soft, non-tender, non distended Skin: Warm and Dry  Extremities: Upper/lower extremity pulses 2+, no lower extremity edema  Pertinent Labs, Studies, and Procedures:     Latest Ref Rng & Units 01/05/2022    4:56 AM 01/04/2022    3:31 AM 01/03/2022    4:48 AM  CBC  WBC 4.0 - 10.5 K/uL 8.6  9.8  10.0   Hemoglobin 13.0 - 17.0 g/dL 10.8  10.4  11.5   Hematocrit 39.0 - 52.0 % 33.0  31.4  34.9   Platelets 150 - 400 K/uL 242  253  258        Latest Ref Rng & Units 01/05/2022    4:56 AM 01/04/2022    3:31 AM 01/03/2022     4:48 AM  BMP  Glucose 70 - 99 mg/dL 124  114  89   BUN 8 - 23 mg/dL '20  25  26   '$ Creatinine 0.61 - 1.24 mg/dL 1.96  2.43  2.71   Sodium 135 - 145 mmol/L 134  136  134   Potassium 3.5 - 5.1 mmol/L 3.9  3.6  3.9   Chloride 98 - 111 mmol/L 102  99  99   CO2 22 - 32 mmol/L '24  27  24   '$ Calcium 8.9 - 10.3 mg/dL 9.1  9.2  9.5     ECHOCARDIOGRAM COMPLETE  Result Date: 01/02/2022    ECHOCARDIOGRAM REPORT   Patient Name:   FABIO WAH Date of Exam: 01/02/2022 Medical Rec #:  [id number]         Height:       72.0 in Accession #:  [id number]        Weight:       176.6 lb Date of Birth:  12-Jun-1960          BSA:          2.021 m Patient Age:    71 years          BP:           106/70 mmHg Patient Gender: M                 HR:           82 bpm. Exam Location:  Inpatient Procedure: 2D Echo, Cardiac Doppler and Color Doppler Indications:    R06.02 SOB  History:        Patient has prior history of Echocardiogram examinations, most                 recent 12/05/2021. Cardiomyopathy, Stroke,                 Signs/Symptoms:Hypotension; Risk Factors:Hypertension and                 Diabetes. ESRD.  Sonographer:    Roseanna Rainbow RDCS Referring Phys: Donita Brooks  Sonographer Comments: Technically difficult study due to poor echo windows, suboptimal parasternal window, suboptimal apical window and suboptimal subcostal window. Patient had hiccups throughout exam. IMPRESSIONS  1. Left ventricular ejection fraction, by estimation, is 65 to 70%. The left ventricle has normal function. The left ventricle has no regional wall motion abnormalities. Left ventricular diastolic parameters are consistent with Grade I diastolic dysfunction (impaired relaxation).  2. Right ventricular systolic function is normal. The right ventricular size is normal.  3. The mitral valve is grossly normal. No evidence of mitral valve regurgitation. No evidence of mitral stenosis.  4. The aortic valve is tricuspid. Aortic valve regurgitation is not  visualized. Aortic valve sclerosis is present, with no evidence of aortic valve stenosis.  5. The inferior vena cava is normal in size with greater than 50% respiratory variability, suggesting right atrial pressure of 3 mmHg. Comparison(s): No significant change from prior study. FINDINGS  Left Ventricle: Left ventricular ejection fraction, by estimation, is 65 to 70%. The left ventricle has normal function. The left ventricle has no regional wall motion abnormalities. The left ventricular internal cavity size was normal in size. There is  no left ventricular hypertrophy. Left ventricular diastolic parameters are consistent with Grade I diastolic dysfunction (impaired relaxation). Right Ventricle: The right ventricular size is normal. No increase in right ventricular wall thickness. Right ventricular systolic function is normal. Left Atrium: Left atrial size was normal in size. Right Atrium: Right atrial size was normal in size. Pericardium: There is no evidence of pericardial effusion. Mitral Valve: The mitral valve is grossly normal. No evidence of mitral valve regurgitation. No evidence of mitral valve stenosis. Tricuspid Valve: The tricuspid valve is grossly normal. Tricuspid valve regurgitation is trivial. No evidence of tricuspid stenosis. Aortic Valve: The aortic valve is tricuspid. Aortic valve regurgitation is not visualized. Aortic valve sclerosis is present, with no evidence of aortic valve stenosis. Pulmonic Valve: The pulmonic valve was grossly normal. Pulmonic valve regurgitation is not visualized. No evidence of pulmonic stenosis. Aorta: The aortic root and ascending aorta are structurally normal, with no evidence of dilitation. Venous: The inferior vena cava is normal in size with greater than 50% respiratory variability, suggesting right atrial pressure of 3 mmHg. IAS/Shunts: The atrial septum is grossly normal. Additional Comments:  A venous catheter is visualized in the right atrium.  LEFT  VENTRICLE PLAX 2D LVIDd:         3.30 cm     Diastology LVIDs:         2.40 cm     LV e' medial:    5.03 cm/s LV PW:         1.70 cm     LV E/e' medial:  10.7 LV IVS:        1.20 cm     LV e' lateral:   4.37 cm/s LVOT diam:     2.20 cm     LV E/e' lateral: 12.3 LV SV:         53 LV SV Index:   26 LVOT Area:     3.80 cm  LV Volumes (MOD) LV vol d, MOD A2C: 27.3 ml LV vol d, MOD A4C: 46.4 ml LV vol s, MOD A2C: 9.5 ml LV vol s, MOD A4C: 15.0 ml LV SV MOD A2C:     17.8 ml LV SV MOD A4C:     46.4 ml LV SV MOD BP:      25.9 ml RIGHT VENTRICLE         IVC TAPSE (M-mode): 1.5 cm  IVC diam: 2.00 cm LEFT ATRIUM             Index       RIGHT ATRIUM           Index LA diam:        2.70 cm 1.34 cm/m  RA Area:     12.80 cm LA Vol (A2C):   13.8 ml 6.83 ml/m  RA Volume:   30.60 ml  15.14 ml/m LA Vol (A4C):   12.7 ml 6.28 ml/m LA Biplane Vol: 14.4 ml 7.13 ml/m  AORTIC VALVE LVOT Vmax:   87.33 cm/s LVOT Vmean:  56.233 cm/s LVOT VTI:    0.140 m  AORTA Ao Root diam: 3.20 cm Ao Asc diam:  3.20 cm MITRAL VALVE MV Area (PHT): 3.57 cm    SHUNTS MV Decel Time: 213 msec    Systemic VTI:  0.14 m MV E velocity: 53.80 cm/s  Systemic Diam: 2.20 cm MV A velocity: 70.20 cm/s MV E/A ratio:  0.77 Eleonore Chiquito MD Electronically signed by Eleonore Chiquito MD Signature Date/Time: 01/02/2022/1:56:15 PM    Final    MR ABDOMEN MRCP WO CONTRAST  Result Date: 01/02/2022 CLINICAL DATA:  Cholecystitis, abnormal gallbladder by prior CT and MR EXAM: MRI ABDOMEN WITHOUT CONTRAST  (INCLUDING MRCP) TECHNIQUE: Multiplanar multisequence MR imaging of the abdomen was performed. Heavily T2-weighted images of the biliary and pancreatic ducts were obtained, and three-dimensional MRCP images were rendered by post processing. COMPARISON:  CT abdomen pelvis, right upper quadrant ultrasound, 01/01/2022 FINDINGS: Lower chest: No acute abnormality. Hepatobiliary: Hepatic iron deposition. No obvious liver mass or other abnormality. No discrete gallstones. Thickened,  irregular material within the gallbladder lumen, in keeping with findings of prior ultrasound (series 4, image 14). Mild gallbladder wall thickening. No biliary ductal dilatation or evidence of choledocholithiasis. Pancreas: Unremarkable. No pancreatic ductal dilatation or surrounding inflammatory changes. Spleen: Normal in size without significant abnormality. Adrenals/Urinary Tract: Adrenal glands are unremarkable. Simple, benign bilateral renal cortical cysts for which no further follow-up or characterization is required. Kidneys are otherwise normal, without renal calculi, obvious solid lesion, or hydronephrosis. Stomach/Bowel: Stomach is within normal limits. No evidence of bowel wall thickening, distention, or inflammatory changes. Vascular/Lymphatic: No significant vascular findings  are present. No enlarged abdominal lymph nodes. Other: No abdominal wall hernia or abnormality. No ascites. Musculoskeletal: No acute or significant osseous findings. IMPRESSION: 1. Thickened, irregular material within the gallbladder lumen, in keeping with findings of prior ultrasound. This is of uncertain significance, possibly reflecting gallbladder sludge, blood product, or alternately soft tissue. Contrast enhanced MR would be helpful to further evaluate. 2. Mild gallbladder wall thickening. No discrete gallstones. 3. No biliary ductal dilatation or evidence of choledocholithiasis. 4. Hepatic iron deposition. Electronically Signed   By: Delanna Ahmadi M.D.   On: 01/02/2022 07:55   DG Chest Port 1 View  Result Date: 01/02/2022 CLINICAL DATA:  Sepsis. EXAM: PORTABLE CHEST 1 VIEW COMPARISON:  01/01/2022 FINDINGS: 0448 hours. Stable asymmetric elevation right hemidiaphragm. The lungs are clear without focal pneumonia, edema, pneumothorax or pleural effusion. The cardiopericardial silhouette is within normal limits for size. Right IJ dialysis catheter tip overlies the right atrium. Telemetry leads overlie the chest. IMPRESSION:  No acute cardiopulmonary findings. Electronically Signed   By: Misty Stanley M.D.   On: 01/02/2022 06:41   US Abdomen Limited RUQ (LIVER/GB)  Result Date: 01/01/2022 CLINICAL DATA:  Abdominal pain.  Abnormal CT. EXAM: ULTRASOUND ABDOMEN LIMITED RIGHT UPPER QUADRANT COMPARISON:  None Available. FINDINGS: Gallbladder: The gallbladder is poorly visualized. There is no gallbladder wall thickening or pericholecystic fluid. Sonographic Percell Miller sign is negative per sonographer. Gallbladder sludge is present. There is a nonmobile rounded area with linear echogenic border in the fundus of the gallbladder measuring 2.2 x 2.0 by 2.3 cm. This is nonshadowing. Common bile duct: Diameter: 3 mm.  Poorly visualized. Liver: Incompletely visualized. No gross abnormality. Echogenicity within normal limits. Portal vein is patent on color Doppler imaging with normal direction of blood flow towards the liver. Other: None. IMPRESSION: 1. Technically limited study. 2. 2.3 cm rounded area in the gallbladder fundus, indeterminate. Findings may be related to an adherent sludge ball or possibly gallbladder mass. This can be further evaluated with MRI. No sonographic evidence for acute cholecystitis. 3. The liver is not well visualized. Electronically Signed   By: Ronney Asters M.D.   On: 01/01/2022 17:06   CT ABDOMEN PELVIS WO CONTRAST  Result Date: 01/01/2022 CLINICAL DATA:  Acute nonlocalized abdominal pain, hypotension, hematemesis EXAM: CT ABDOMEN AND PELVIS WITHOUT CONTRAST TECHNIQUE: Multidetector CT imaging of the abdomen and pelvis was performed following the standard protocol without IV contrast. RADIATION DOSE REDUCTION: This exam was performed according to the departmental dose-optimization program which includes automated exposure control, adjustment of the mA and/or kV according to patient size and/or use of iterative reconstruction technique. COMPARISON:  09/27/2021 FINDINGS: Lower chest: Minimal dependent bibasilar  atelectasis Hepatobiliary: Question mild gallbladder wall thickening and surrounding infiltrative changes. Liver unremarkable. Pancreas: Normal appearance Spleen: Normal appearance Adrenals/Urinary Tract: Adrenal glands normal appearance. Small mid LEFT renal cyst unchanged, 16 mm diameter. Kidneys, ureters, and bladder otherwise unremarkable. Stomach/Bowel: Normal appendix. Scattered stool throughout colon and rectum. No bowel wall thickening or evidence of obstruction. Stomach unremarkable. Vascular/Lymphatic: Atherosclerotic calcifications aorta, iliac arteries, coronary arteries. Aorta normal caliber. No adenopathy. Reproductive: Prior seed implant prostate gland. Other: BILATERAL hernias containing fat. No free air or free fluid. Umbilical hernia containing fat. Musculoskeletal: Lipoma of LEFT iliopsoas tendon distally. Degenerative disc disease changes L4-L5 and L5-S1. Chronic superior endplate irregularity anteriorly L4. No acute osseous findings. IMPRESSION: Question mild gallbladder wall thickening and surrounding infiltrative changes, acute cholecystitis not excluded; recommend correlation with LFTs and consider RIGHT upper quadrant abdominal ultrasound assessment. BILATERAL  inguinal and umbilical hernias containing fat. Aortic Atherosclerosis (ICD10-I70.0). Electronically Signed   By: Lavonia Dana M.D.   On: 01/01/2022 15:48   CT Head Wo Contrast  Result Date: 01/01/2022 CLINICAL DATA:  Hypotension.  Head trauma. EXAM: CT HEAD WITHOUT CONTRAST TECHNIQUE: Contiguous axial images were obtained from the base of the skull through the vertex without intravenous contrast. RADIATION DOSE REDUCTION: This exam was performed according to the departmental dose-optimization program which includes automated exposure control, adjustment of the mA and/or kV according to patient size and/or use of iterative reconstruction technique. COMPARISON:  01/19/2021 FINDINGS: Brain: Significantly age advanced cerebral and  cerebellar atrophy. Moderate low density in the periventricular white matter likely related to small vessel disease. Bilateral, left greater than right cerebellar hemispheric remote infarcts. No mass lesion, hemorrhage, hydrocephalus, acute infarct, intra-axial, or extra-axial fluid collection. Vascular: Intracranial atherosclerosis. No hyperdense vessel or unexpected calcification. Skull: No significant soft tissue swelling. Cerumen in the left external ear canal. No skull fracture. Sinuses/Orbits: Hyperattenuation in the left globe is similar and likely related to chronic hematoma. Clear paranasal sinuses and mastoid air cells. Other: None. IMPRESSION: 1. No acute intracranial abnormality. 2. Significantly age advanced cerebral and cerebellar atrophy with periventricular white matter small vessel ischemic change. 3. Remote bilateral cerebellar hemispheric infarcts. Electronically Signed   By: Abigail Miyamoto M.D.   On: 01/01/2022 15:44   DG Chest Portable 1 View  Result Date: 01/01/2022 CLINICAL DATA:  Weakness, hematemesis. EXAM: PORTABLE CHEST 1 VIEW COMPARISON:  12/03/2021 and CT chest 01/19/2021. FINDINGS: Right IJ dialysis catheter tip is in the right atrium. Trachea is midline. Heart size stable. Lungs are somewhat low in volume with streaky atelectasis or scarring in the lung bases. No airspace consolidation or pleural fluid. IMPRESSION: No acute findings. Electronically Signed   By: Lorin Picket M.D.   On: 01/01/2022 12:53     Discharge Instructions: Discharge Instructions     Call MD for:  persistant nausea and vomiting   Complete by: As directed    Call MD for:  severe uncontrolled pain   Complete by: As directed    Call MD for:  temperature >100.4   Complete by: As directed    Diet - low sodium heart healthy   Complete by: As directed    Increase activity slowly   Complete by: As directed    No wound care   Complete by: As directed        Signed: Drucie Opitz, MD 01/05/2022,  1:39 PM   Pager: [phone]

## 2022-01-05 NOTE — Progress Notes (Signed)
[facility] Progress Note    Assessment/ Plan:   ESRD:  -outpatient orders: [facility], TTS. 4hrs. F180. Flow rates: 400/600. EDW 79.3kg. 4K, 2.25Cal. Meds: mircera 145mg q2weeks (last dose 8/10). Venofer load '100mg'$  qtreatment (has 5 more doses left at the time of admit). -recently started on HD in May 2023. S/p Renal biopsy in 09/2021: ATI w/ occasional myoglobin casts, diabetic neuropathy (class IIb), moderate IF/TA (however small cortical sample) -Cr continues to improve without dialysis (last HD 8/12 outpatient).  -His Cr continues to improve on a daily basis without any uremic symptoms nor volume issues. He is in renal recovery finally. -Will consult IR today for TDC removal (already NPO) -no indications for renal replacement therapy -will arrange for outpatient follow up with at our office   Shock -etiology? Possibly hypovolemic in nature given exam and given that he has responded to fluids. BP now stable, out of ICU now.   Lactic acidosis -improved   Volume: EDW 79.3kg (recently lowered given that he was under his EDW).  Monitor strict I/O if possible. -euvolemic on exam   Anemia of Chronic Kidney Disease: Hemoglobin 12.6 on presentation, now 14.6, likely hemoconcentrated. Received ESA last on 8/10, not due for it until 8/24. Receiving Fe load as an outpatient-will hold for now.    Secondary Hyperparathyroidism/Hyperphosphatemia: not on binders, VDRA, or Vit D analogues. Monitor for now. Renal diet recommended. PO4 WNL  Updates patient's sister ASeth Bakeover the phone as well at patient's request. Will arrange for follow up with [facility] the office, contact info provided to patient. Will continue to follow for now.    Subjective:   Patient seen and examined in room. No acute events, no complaints. Reports that he is urinating adequately. Denies fevers, chest pain, SOB, swelling, n/v, dysgeusia, loss of appetite, brain fog, new tremors/shakes, intractable hiccups/pruritis.   Discussed with patient's sister over the phone as well.   Objective:   BP 121/75 (BP Location: Left Arm)   Pulse 81   Temp 97.9 F (36.6 C) (Oral)   Resp 18   Wt 81.6 kg   SpO2 100%   BMI 24.40 kg/m   Intake/Output Summary (Last 24 hours) at 01/05/2022 0819 Last data filed at 01/04/2022 2200 Gross per 24 hour  Intake --  Output 700 ml  Net -700 ml   Weight change: 0.2 kg  Physical Exam: Gen:NAD, laying flat in bed CVS:RRR Resp:CTA B/L APOE:UMPNExt:no sig edema Neuro: awake, alert, no asterixis Dialysis access: RIJ TDC  Imaging: No results found.  Labs: BMET Recent Labs  Lab 01/01/22 1200 01/01/22 1226 01/02/22 0130 01/03/22 0448 01/04/22 0331 01/05/22 0456  NA 136 136 140 134* 136 134*  K 3.8 3.8 3.4* 3.9 3.6 3.9  CL 91* 95* 100 99 99 102  CO2 20*  --  '27 24 27 24  '$ GLUCOSE 297* 287* 111* 89 114* 124*  BUN 28* 31* 29* 26* 25* 20  CREATININE 5.72* 5.90* 4.34* 2.71* 2.43* 1.96*  CALCIUM 10.2  --  9.4 9.5 9.2 9.1  PHOS  --   --  4.3  --   --  2.7   CBC Recent Labs  Lab 01/01/22 1200 01/01/22 1226 01/02/22 0130 01/03/22 0448 01/04/22 0331 01/05/22 0456  WBC 18.6*  --  13.5* 10.0 9.8 8.6  NEUTROABS 14.7*  --   --   --   --   --   HGB 12.6*   < > 11.1* 11.5* 10.4* 10.8*  HCT 39.6   < >  34.7* 34.9* 31.4* 33.0*  MCV 90.8  --  89.2 88.8 87.7 88.5  PLT 280  --  224 258 253 242   < > = values in this interval not displayed.    Medications:     aspirin EC  81 mg Oral Daily   Chlorhexidine Gluconate Cloth  6 each Topical Daily   fluticasone  2 spray Each Nare Daily   heparin  5,000 Units Subcutaneous Q8H   insulin aspart  0-9 Units Subcutaneous Q4H   pantoprazole  40 mg Oral BID      Gean Quint, MD [facility] 01/05/2022, 8:19 AM

## 2022-01-05 NOTE — Discharge Instructions (Addendum)
You were admitted to the hospital for low blood pressure. While in the hospital, we gave you some fluids and your blood pressure was doing better. The kidney doctors believe that you don't need Dialysis anymore as well!   Please stop taking the following medications: Lisinopril, Coreg, and Hydrochlorothiazide.  It was a pleasure taking care of you!

## 2022-01-06 ENCOUNTER — Other Ambulatory Visit: Payer: Self-pay | Admitting: Internal Medicine

## 2022-01-06 ENCOUNTER — Encounter (HOSPITAL_COMMUNITY): Payer: Self-pay | Admitting: Critical Care Medicine

## 2022-01-06 DIAGNOSIS — E118 Type 2 diabetes mellitus with unspecified complications: Secondary | ICD-10-CM

## 2022-01-06 LAB — CULTURE, BLOOD (ROUTINE X 2)
Culture: NO GROWTH
Culture: NO GROWTH

## 2022-01-08 ENCOUNTER — Telehealth: Payer: Self-pay

## 2022-01-08 NOTE — Patient Outreach (Signed)
  Care Coordination [facility] Note Transition Care Management Follow-up Telephone Call Date of discharge and from [facility] 01[facility]-01[facility]05/22 How have you been since you were released from the hospital? "I am feeling really Good". Any questions or concerns? No  Items Reviewed: Did the pt receive and understand the discharge instructions provided? Yes  Medications obtained and verified? Yes -Verified patient's Sister has assisted with med reconciliation since d/c and medication changes. Other? No  Any new allergies since your discharge? No  Dietary orders reviewed? Yes Do you have support at home? Yes   Home Care and Equipment/Supplies: Were home health services ordered? yes If so, what is the name of the agency? [facility]  Has the agency set up a time to come to the patient's home? no Were any new equipment or medical supplies ordered?  No What is the name of the medical supply agency? N/A Were you able to get the supplies/equipment? not applicable Do you have any questions related to the use of the equipment or supplies? No  Functional Questionnaire: (I = Independent and D = Dependent) ADLs: I  Bathing/Dressing- I  Meal Prep- I  Eating- I  Maintaining continence- I  Transferring/Ambulation- I  Managing Meds- D  Follow up appointments reviewed:  PCP Hospital f/u appt confirmed? No   Specialist Hospital f/u appt confirmed? No   Are transportation arrangements needed? No  If their condition worsens, is the pt aware to call PCP or go to the Emergency Dept.? Yes Was the patient provided with contact information for the PCP's office or ED? Yes Was to pt encouraged to call back with questions or concerns? Yes  SDOH assessments and interventions completed:   Yes  Care Coordination Interventions Activated:  Yes   Care Coordination Interventions:  PCP follow up appointment requested    Encounter Outcome:  Pt. Visit Completed

## 2022-01-13 NOTE — Progress Notes (Signed)
ESRD resolved; discharged with stage CKD 3b and improving.  No further dialysis planned.

## 2022-01-19 ENCOUNTER — Encounter: Payer: Medicare Other | Admitting: Student

## 2022-01-19 ENCOUNTER — Ambulatory Visit: Payer: Medicare Other

## 2022-02-19 ENCOUNTER — Ambulatory Visit (INDEPENDENT_AMBULATORY_CARE_PROVIDER_SITE_OTHER): Payer: Medicare Other | Admitting: Podiatry

## 2022-02-19 ENCOUNTER — Encounter: Payer: Self-pay | Admitting: Podiatry

## 2022-02-19 DIAGNOSIS — B351 Tinea unguium: Secondary | ICD-10-CM

## 2022-02-19 DIAGNOSIS — N179 Acute kidney failure, unspecified: Secondary | ICD-10-CM

## 2022-02-19 DIAGNOSIS — M79676 Pain in unspecified toe(s): Secondary | ICD-10-CM

## 2022-02-19 DIAGNOSIS — D689 Coagulation defect, unspecified: Secondary | ICD-10-CM | POA: Diagnosis not present

## 2022-02-19 DIAGNOSIS — E119 Type 2 diabetes mellitus without complications: Secondary | ICD-10-CM

## 2022-02-19 NOTE — Progress Notes (Signed)
This patient returns to my office for at risk foot care.  This patient requires this care by a professional since this patient will be at risk due to having CKD stage 3 due to type 2 diabetes  CVA, and coagulation defect.  Patient is taking plavix..    This patient is unable to cut nails himself since the patient cannot reach his nails.These nails are painful walking and wearing shoes.  This patient presents for at risk foot care today.  General Appearance  Alert, conversant and in no acute stress.  Vascular  Dorsalis pedis and posterior tibial  pulses are palpable  bilaterally.  Capillary return is within normal limits  bilaterally. Temperature is within normal limits  bilaterally.  Neurologic  Deferred.  Nails Thick disfigured discolored nails with subungual debris  from hallux to fifth toes bilaterally. No evidence of bacterial infection or drainage bilaterally.  Orthopedic  No limitations of motion  feet .  No crepitus or effusions noted.  No bony pathology or digital deformities noted. HAV  B/L.  Skin  normotropic skin with no porokeratosis noted bilaterally.  No signs of infections or ulcers noted.     Onychomycosis  Pain in right toes  Pain in left toes  Consent was obtained for treatment procedures.   Mechanical debridement of nails 1-5  bilaterally performed with a nail nipper.  Filed with dremel without incident.    Return office visit   4 months              Told patient to return for periodic foot care and evaluation due to potential at risk complications.   Gardiner Barefoot DPM

## 2022-06-05 NOTE — Progress Notes (Deleted)
Hypotension Previously on midodrine TID prior to HD Prior hx of septic shock requiring ICU admission and pressors. Antihypertensives were stopped at that time but patient required Lisinopril, Coreg, HCTZ by mistake. Patient has been on **** Blood pressure today ***   Gallbladder thickening CT Abdomen/pelvis during last hospitalization 12/2021 with gallbladder thickening. RUQ ultrasound and MRCP with thickened and irregular material within the gallbladder. GI and surgery were consulted during hospitalization and recommended no intervention   Prior ESRD -> CKD3b History of acute renal failure in 09/2021 with renal bx with diabetic changes. 09/2021 acute renal failure. HD: 09/2021 -> 12/2021 ESRD ->CKD4 11/2021 -> CKDIIIb 12/2021, stopped HD Patient with  improvement of Cr 5->1.9 at discharge after cessation of antihypertensive medications during hospitalization, thought to be 2/2 increased renal perfusion. Last BMP Cr. 1.9 GFR 39 -Nephrology follow up?  Anemia Prior hx of coffee ground emesis - ? ESA at previous HD sessions  Iron studies: Ferritin 488, Iron 56, TIBC 246, Sat ratio 23 TSAT 22 - borderline -CBC today  HLD CAD CVA Patient discontinued statin therapy after myoglobin casts in renal bx.  Continue aspirin, resuming Plavix   #Type 2 Diabetes Last A1c 5.3 12/2021 BMI Empagliflozin- Metformin [id number] mg BID Trulicity 1.5 mg weekly --BMI of 24?   -A1c today  #Prostate Cancer hx LUTS? Was following with Dr. Tammi Klippel outpatient

## 2022-06-06 ENCOUNTER — Ambulatory Visit: Payer: Medicare Other

## 2022-06-06 ENCOUNTER — Encounter: Payer: Medicare Other | Admitting: Student

## 2022-06-06 DIAGNOSIS — I420 Dilated cardiomyopathy: Secondary | ICD-10-CM

## 2022-06-06 DIAGNOSIS — N1832 Chronic kidney disease, stage 3b: Secondary | ICD-10-CM

## 2022-08-06 NOTE — Progress Notes (Deleted)
Cardiology Clinic Note   Patient Name: Kevin Soto Date of Encounter: 08/06/2022  Primary Care Provider:  Romana Juniper, MD Primary Cardiologist:  None  Patient Profile    Kevin Soto 62 year old male presents to the clinic today for follow-up evaluation of his dilated cardiomyopathy and essential hypertension.  Past Medical History    Past Medical History:  Diagnosis Date   Anemia    CAD (coronary artery disease)    cath 8/12:  LM ok, mLAD occluded with trivial collats from right AM and dRCA, mDx (small) 80%, RI 25%, tiny branch of OM 90%, mAM 30-40%, RCA sub-totally occluded, then 80%, EF 40%, inf HK.  He underwent PCI with [name] with placement of a Promus DES to the mRCA x 2   Chronic kidney disease    stage 3 per chart   Diabetes mellitus    Dyslipidemia    ED (erectile dysfunction)    Gait instability 02/27/2011   History of alcohol abuse    History of stomach ulcers    History of stroke    evidence of CVA on MRI in past   Hyperlipidemia    Hypertension    Ischemic cardiomyopathy    echo 7/12: EF 40-45%, septal, apical, inf basal HK, mod LVH, mild MR, mild LAE.  EF 55% by echo 2015   Macular hemorrhage    Prostate cancer ([place])    Septic shock ([place]) 12/03/2021   Sleep apnea    no c-pap; wife denies   Vision, loss, sudden 02/27/2011   Past Surgical History:  Procedure Laterality Date   BIOPSY  10/04/2021   Procedure: BIOPSY;  Surgeon: Lavena Bullion, DO;  Location: [facility];  Service: Gastroenterology;;   CORONARY ANGIOPLASTY WITH STENT PLACEMENT  2012   2 stents   ESOPHAGOGASTRODUODENOSCOPY (EGD) WITH PROPOFOL N/A 10/04/2021   Procedure: ESOPHAGOGASTRODUODENOSCOPY (EGD) WITH PROPOFOL;  Surgeon: Lavena Bullion, DO;  Location: [facility];  Service: Gastroenterology;  Laterality: N/A;   IR FLUORO GUIDE CV LINE RIGHT  09/29/2021   IR REMOVAL TUN CV CATH W/O FL  01/05/2022   IR US GUIDE VASC ACCESS RIGHT  09/29/2021    RADIOACTIVE SEED IMPLANT N/A 05/08/2017   Procedure: RADIOACTIVE SEED IMPLANT/BRACHYTHERAPY IMPLANT;  Surgeon: Alexis Frock, MD;  Location[facility];  Service: Urology;  Laterality: N/A;   REFRACTIVE SURGERY  2012   right and left eye/ per pt only left eye   SPACE OAR INSTILLATION N/A 05/08/2017   Procedure: SPACE OAR INSTILLATION;  Surgeon: Alexis Frock, MD;  Location: [facility];  Service: Urology;  Laterality: N/A;    Allergies  Allergies  Allergen Reactions   Clonidine Derivatives Other (See Comments)    Dizziness Drowsiness     History of Present Illness    Kevin Soto has a PMH of dilated cardiomyopathy, HTN, HLD, CAD, CVA, GERD, type 2 diabetes, CKD stage III, ED, chronic nasal congestion, and CKD stage IIIb.  He was seen in follow-up by [name] on 12/01/2021.  He followed up for coronary artery disease.  He was having dialysis 3 times per week.  He presented walking slowly with a cane.  He denied new symptoms/issues.  His sister was able to bring him to his clinic visit.  He denied presyncope and syncope.  His blood pressure continued to be low which was not new.  He denied problems with HD.  He was  admitted to the hospital on 01/01/2022 until 01/05/2022.  He presented with complaints of weakness and abdominal pain.  He was admitted for hypotension that was felt to be related to his medications.  He responded well to fluid resuscitation.  He had been admitted for septic shock 12/03/2021.  During that time he was transferred to ICU and required vasopressors.  His antihypertensive medication was stopped.  However, he continued taking lisinopril, carvedilol, and HCTZ by mistake.  His CT abdomen pelvis was unremarkable except for a questionable gallbladder thickening that required further examination of his right upper quadrant with ultrasound and MRCP.  GI and general surgery  were consulted.  They recommended no intervention.  He presents  to the clinic today for follow-up evaluation and states***.  *** denies chest pain, shortness of breath, lower extremity edema, fatigue, palpitations, melena, hematuria, hemoptysis, diaphoresis, weakness, presyncope, syncope, orthopnea, and PND.  Coronary artery disease, HLD-no chest pain today. Continue clopidogrel Heart healthy low-sodium diet-salty 6 given Increase physical activity as tolerated  History of CVA-neurologically intact. Continue clopidogrel  Essential hypertension-BP today***. Continue current medical therapy Continue blood pressure log  ESRD-creatinine*** Follows with PCP  Type 2 diabetes-A1c*** Continue metformin, empagliflozin, Trulicity Carb modified diet Increase physical activity as tolerated Follows with PCP  Disposition: Follow-up with [name] in 6-9 months.  Home Medications    Prior to Admission medications   Medication Sig Start Date End Date Taking? Authorizing Provider  clopidogrel (PLAVIX) 75 MG tablet Take 1 tablet (75 mg total) by mouth daily. 06/14/21   Rehman, Areeg N, DO  Darbepoetin Alfa (ARANESP) 100 MCG/0.5ML SOSY injection Inject 0.5 mLs (100 mcg total) into the vein every Thursday with hemodialysis. Patient not taking: Reported on 01/01/2022 12/14/21   Romana Juniper, MD  Empagliflozin-metFORMIN HCl ER (SYNJARDY XR) 09-998 MG TB24 Take 1 tablet by mouth in the morning and at bedtime.    [provider]  fluticasone (FLONASE) 50 MCG/ACT nasal spray Place 2 sprays into both nostrils daily. 06/08/20   Harvie Heck, MD  pantoprazole (PROTONIX) 40 MG tablet Take 1 tablet (40 mg total) by mouth 2 (two) times daily. 10/05/21   Lajean Manes, MD  senna-docusate (SENOKOT-S) 8.6-50 MG tablet Take 1 tablet by mouth at bedtime as needed for mild constipation. Patient taking differently: Take 1 tablet by mouth at bedtime. 10/05/21   Lajean Manes, MD  tamsulosin (FLOMAX) 0.4 MG CAPS capsule Take 0.4 mg by mouth daily.    [provider]  TRULICITY 3 0000000 SOPN INJECT THE CONTENTS OF ONE PEN  SUBCUTANEOUSLY WEEKLY AS  DIRECTED 01/08/22   Lacinda Axon, MD  Vitamin D, Ergocalciferol, (DRISDOL) 1.25 MG (50000 UNIT) CAPS capsule Take 50,000 Units by mouth every 7 (seven) days.    [provider]    Family History    Family History  Adopted: Yes  Problem Relation Age of Onset   Heart attack Mother        Died 61   Heart failure Mother    Diabetes Mother    Heart attack Father    Heart failure Father    Diabetes Father    Prostate cancer Father    Cancer Father        Prostate   Heart disease Sister        s/p CABG x3 in her mid 67's   Crohn's disease Sister    Crohn's disease Other        niece   Kidney disease Brother    is adopted.   Social History  Social History   Socioeconomic History   Marital status: Married    Spouse name: Aleta   Number of children: 0   Years of education: Not on file   Highest education level: Not on file  Occupational History   Occupation: disabled  Tobacco Use   Smoking status: Never   Smokeless tobacco: Never  Vaping Use   Vaping Use: Never used  Substance and Sexual Activity   Alcohol use: Yes    Alcohol/week: 4.0 standard drinks of alcohol    Types: 2 Cans of beer, 2 Shots of liquor per week   Drug use: No   Sexual activity: Not on file  Other Topics Concern   Not on file  Social History Narrative   Lives in [place] with his cousinNo childrenExercises 3 times a week   Right handed    Social Determinants of Health   Financial Resource Strain: Not on file  Food Insecurity: Not on file  Transportation Needs: No Transportation Needs (01/08/2022)   PRAPARE - Transportation    Lack of Transportation (Medical): No    Lack of Transportation (Non-Medical): No  Physical Activity: Not on file  Stress: Not on file  Social Connections: Not on file  Intimate Partner Violence: Not on file     Review of Systems    General:  No  chills, fever, night sweats or weight changes.  Cardiovascular:  No chest pain, dyspnea on exertion, edema, orthopnea, palpitations, paroxysmal nocturnal dyspnea. Dermatological: No rash, lesions/masses Respiratory: No cough, dyspnea Urologic: No hematuria, dysuria Abdominal:   No nausea, vomiting, diarrhea, bright red blood per rectum, melena, or hematemesis Neurologic:  No visual changes, wkns, changes in mental status. All other systems reviewed and are otherwise negative except as noted above.  Physical Exam    VS:  There were no vitals taken for this visit. , BMI There is no height or weight on file to calculate BMI. GEN: Well nourished, well developed, in no acute distress. HEENT: normal. Neck: Supple, no JVD, carotid bruits, or masses. Cardiac: RRR, no murmurs, rubs, or gallops. No clubbing, cyanosis, edema.  Radials/DP/PT 2+ and equal bilaterally.  Respiratory:  Respirations regular and unlabored, clear to auscultation bilaterally. GI: Soft, nontender, nondistended, BS + x 4. MS: no deformity or atrophy. Skin: warm and dry, no rash. Neuro:  Strength and sensation are intact. Psych: Normal affect.  Accessory Clinical Findings    Recent Labs: 12/03/2021: TSH 1.492 01/01/2022: ALT 17; B Natriuretic Peptide 39.2 01/02/2022: Magnesium 1.8 01/05/2022: BUN 20; Creatinine, Ser 1.96; Hemoglobin 10.8; Platelets 242; Potassium 3.9; Sodium 134   Recent Lipid Panel    Component Value Date/Time   CHOL 128 07/14/2021 1033   TRIG 110 07/14/2021 1033   HDL 39 (L) 07/14/2021 1033   CHOLHDL 3.3 07/14/2021 1033   CHOLHDL 3.3 08/17/2014 1532   VLDL 17 08/17/2014 1532   LDLCALC 69 07/14/2021 1033    No BP recorded.  {Refresh Note OR Click here to enter BP  :1}***    ECG personally reviewed by me today- *** - No acute changes  Echo 01/02/22  IMPRESSIONS     1. Left ventricular ejection fraction, by estimation, is 65 to 70%. The  left ventricle has normal function. The left ventricle  has no regional  wall motion abnormalities. Left ventricular diastolic parameters are  consistent with Grade I diastolic  dysfunction (impaired relaxation).   2. Right ventricular systolic function is normal. The right ventricular  size is normal.   3. The  mitral valve is grossly normal. No evidence of mitral valve  regurgitation. No evidence of mitral stenosis.   4. The aortic valve is tricuspid. Aortic valve regurgitation is not  visualized. Aortic valve sclerosis is present, with no evidence of aortic  valve stenosis.   5. The inferior vena cava is normal in size with greater than 50%  respiratory variability, suggesting right atrial pressure of 3 mmHg.   Comparison(s): No significant change from prior study.   FINDINGS   Left Ventricle: Left ventricular ejection fraction, by estimation, is 65  to 70%. The left ventricle has normal function. The left ventricle has no  regional wall motion abnormalities. The left ventricular internal cavity  size was normal in size. There is   no left ventricular hypertrophy. Left ventricular diastolic parameters  are consistent with Grade I diastolic dysfunction (impaired relaxation).   Right Ventricle: The right ventricular size is normal. No increase in  right ventricular wall thickness. Right ventricular systolic function is  normal.   Left Atrium: Left atrial size was normal in size.   Right Atrium: Right atrial size was normal in size.   Pericardium: There is no evidence of pericardial effusion.   Mitral Valve: The mitral valve is grossly normal. No evidence of mitral  valve regurgitation. No evidence of mitral valve stenosis.   Tricuspid Valve: The tricuspid valve is grossly normal. Tricuspid valve  regurgitation is trivial. No evidence of tricuspid stenosis.   Aortic Valve: The aortic valve is tricuspid. Aortic valve regurgitation is  not visualized. Aortic valve sclerosis is present, with no evidence of  aortic valve stenosis.    Pulmonic Valve: The pulmonic valve was grossly normal. Pulmonic valve  regurgitation is not visualized. No evidence of pulmonic stenosis.   Aorta: The aortic root and ascending aorta are structurally normal, with  no evidence of dilitation.   Venous: The inferior vena cava is normal in size with greater than 50%  respiratory variability, suggesting right atrial pressure of 3 mmHg.   IAS/Shunts: The atrial septum is grossly normal.   Additional Comments: A venous catheter is visualized in the right atrium.    Assessment & Plan   1.  ***   Jossie Ng. Arlena Marsan NP-C     08/06/2022, 9:47 AM [place] [address] Office [phone] Fax [phone]    I spent***minutes examining this patient, reviewing medications, and using patient centered shared decision making involving her cardiac care.  Prior to her visit I spent greater than 20 minutes reviewing her past medical history,  medications, and prior cardiac tests.

## 2022-08-07 ENCOUNTER — Ambulatory Visit: Payer: Medicare Other | Attending: General Practice | Admitting: General Practice

## 2022-08-24 ENCOUNTER — Encounter: Payer: Self-pay | Admitting: Gastroenterology

## 2022-10-11 ENCOUNTER — Encounter: Payer: Self-pay | Admitting: Podiatry

## 2022-10-11 ENCOUNTER — Ambulatory Visit (INDEPENDENT_AMBULATORY_CARE_PROVIDER_SITE_OTHER): Payer: Medicare Other | Admitting: Podiatry

## 2022-10-11 DIAGNOSIS — E119 Type 2 diabetes mellitus without complications: Secondary | ICD-10-CM | POA: Diagnosis not present

## 2022-10-11 DIAGNOSIS — B351 Tinea unguium: Secondary | ICD-10-CM | POA: Diagnosis not present

## 2022-10-11 DIAGNOSIS — M79676 Pain in unspecified toe(s): Secondary | ICD-10-CM | POA: Diagnosis not present

## 2022-10-11 NOTE — Progress Notes (Signed)
This patient returns to my office for at risk foot care.  This patient requires this care by a professional since this patient will be at risk due to having CKD stage 3 due to type 2 diabetes  CVA, and coagulation defect.  Patient is taking plavix..    This patient is unable to cut nails himself since the patient cannot reach his nails.These nails are painful walking and wearing shoes.  This patient presents for at risk foot care today.  General Appearance  Alert, conversant and in no acute stress.  Vascular  Dorsalis pedis and posterior tibial  pulses are palpable  bilaterally.  Capillary return is within normal limits  bilaterally. Temperature is within normal limits  bilaterally.  Neurologic  Deferred.  Nails Thick disfigured discolored nails with subungual debris  from hallux to fifth toes bilaterally. No evidence of bacterial infection or drainage bilaterally.  Orthopedic  No limitations of motion  feet .  No crepitus or effusions noted.  No bony pathology or digital deformities noted. HAV  B/L.  Skin  normotropic skin with no porokeratosis noted bilaterally.  No signs of infections or ulcers noted.     Onychomycosis  Pain in right toes  Pain in left toes  Consent was obtained for treatment procedures.   Mechanical debridement of nails 1-5  bilaterally performed with a nail nipper.  Filed with dremel without incident.    Return office visit   4 months              Told patient to return for periodic foot care and evaluation due to potential at risk complications.   Stefanos Haynesworth DPM  

## 2022-10-20 DEATH — deceased
